# Patient Record
Sex: Female | Born: 1990 | Hispanic: Yes | Marital: Single | Smoking: Never smoker
Health system: Southern US, Community
[De-identification: ages and names within clinical notes are randomized; demographics above are authoritative.]

## PROBLEM LIST (undated history)

## (undated) DIAGNOSIS — G43909 Migraine, unspecified, not intractable, without status migrainosus: Secondary | ICD-10-CM

## (undated) DIAGNOSIS — Z803 Family history of malignant neoplasm of breast: Secondary | ICD-10-CM

## (undated) DIAGNOSIS — R748 Abnormal levels of other serum enzymes: Secondary | ICD-10-CM

## (undated) DIAGNOSIS — R7303 Prediabetes: Secondary | ICD-10-CM

## (undated) DIAGNOSIS — Z800 Family history of malignant neoplasm of digestive organs: Secondary | ICD-10-CM

## (undated) DIAGNOSIS — K760 Fatty (change of) liver, not elsewhere classified: Secondary | ICD-10-CM

## (undated) DIAGNOSIS — J45909 Unspecified asthma, uncomplicated: Secondary | ICD-10-CM

## (undated) DIAGNOSIS — C50919 Malignant neoplasm of unspecified site of unspecified female breast: Secondary | ICD-10-CM

## (undated) DIAGNOSIS — E282 Polycystic ovarian syndrome: Secondary | ICD-10-CM

## (undated) DIAGNOSIS — R739 Hyperglycemia, unspecified: Secondary | ICD-10-CM

## (undated) DIAGNOSIS — Z8042 Family history of malignant neoplasm of prostate: Secondary | ICD-10-CM

## (undated) DIAGNOSIS — Z8041 Family history of malignant neoplasm of ovary: Secondary | ICD-10-CM

## (undated) HISTORY — PX: NO PAST SURGERIES: SHX2092

## (undated) HISTORY — DX: Polycystic ovarian syndrome: E28.2

## (undated) HISTORY — DX: Family history of malignant neoplasm of prostate: Z80.42

## (undated) HISTORY — DX: Family history of malignant neoplasm of ovary: Z80.41

## (undated) HISTORY — DX: Migraine, unspecified, not intractable, without status migrainosus: G43.909

## (undated) HISTORY — DX: Unspecified asthma, uncomplicated: J45.909

## (undated) HISTORY — DX: Family history of malignant neoplasm of digestive organs: Z80.0

## (undated) HISTORY — DX: Malignant neoplasm of unspecified site of unspecified female breast: C50.919

## (undated) HISTORY — DX: Family history of malignant neoplasm of breast: Z80.3

---

## 2014-07-28 ENCOUNTER — Other Ambulatory Visit (HOSPITAL_COMMUNITY): Payer: Self-pay | Admitting: Nurse Practitioner

## 2014-07-28 DIAGNOSIS — R945 Abnormal results of liver function studies: Secondary | ICD-10-CM

## 2014-07-28 DIAGNOSIS — R7989 Other specified abnormal findings of blood chemistry: Secondary | ICD-10-CM

## 2014-07-28 DIAGNOSIS — D6862 Lupus anticoagulant syndrome: Secondary | ICD-10-CM

## 2014-08-01 ENCOUNTER — Other Ambulatory Visit: Payer: Self-pay | Admitting: Radiology

## 2014-08-02 ENCOUNTER — Other Ambulatory Visit: Payer: Self-pay | Admitting: Radiology

## 2014-08-03 ENCOUNTER — Encounter (HOSPITAL_COMMUNITY): Payer: Self-pay

## 2014-08-03 ENCOUNTER — Ambulatory Visit (HOSPITAL_COMMUNITY)
Admission: RE | Admit: 2014-08-03 | Discharge: 2014-08-03 | Disposition: A | Discharge status: Home / Self Care | Payer: BLUE CROSS/BLUE SHIELD | Source: Ambulatory Visit | Attending: Nurse Practitioner | Admitting: Nurse Practitioner

## 2014-08-03 DIAGNOSIS — R894 Abnormal immunological findings in specimens from other organs, systems and tissues: Secondary | ICD-10-CM | POA: Insufficient documentation

## 2014-08-03 DIAGNOSIS — R7989 Other specified abnormal findings of blood chemistry: Secondary | ICD-10-CM

## 2014-08-03 DIAGNOSIS — D6862 Lupus anticoagulant syndrome: Secondary | ICD-10-CM

## 2014-08-03 DIAGNOSIS — R945 Abnormal results of liver function studies: Secondary | ICD-10-CM

## 2014-08-03 LAB — CBC
HCT: 43.7 % (ref 36.0–46.0)
HEMOGLOBIN: 15.3 g/dL — AB (ref 12.0–15.0)
MCH: 29.3 pg (ref 26.0–34.0)
MCHC: 35 g/dL (ref 30.0–36.0)
MCV: 83.7 fL (ref 78.0–100.0)
Platelets: 294 10*3/uL (ref 150–400)
RBC: 5.22 MIL/uL — ABNORMAL HIGH (ref 3.87–5.11)
RDW: 13 % (ref 11.5–15.5)
WBC: 4.8 10*3/uL (ref 4.0–10.5)

## 2014-08-03 LAB — PROTIME-INR
INR: 1.1 (ref 0.00–1.49)
Prothrombin Time: 14.3 seconds (ref 11.6–15.2)

## 2014-08-03 LAB — APTT: APTT: 31 s (ref 24–37)

## 2014-08-03 IMAGING — US US BIOPSY
1 series · 7 of 7 positions shown · non-contrast
Comparison: none

CLINICAL DATA: 23-year-old female with a history of transaminitis.
She has been referred for medical liver biopsy.

[Series 1: us biopsy · 0.27mm/px · 7 of 7 slices shown]
[im 1/7]
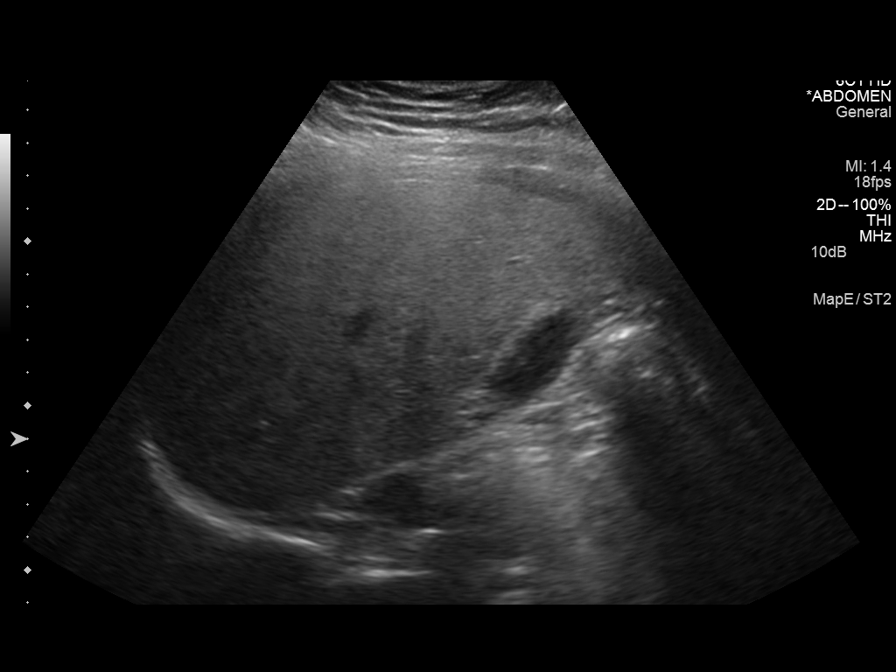
[im 2/7]
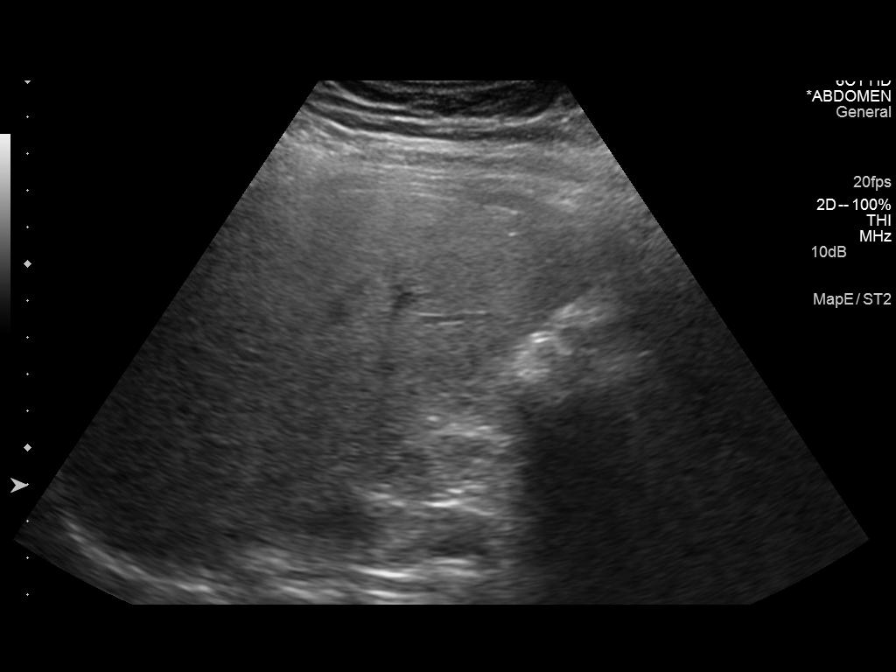
[im 3/7]
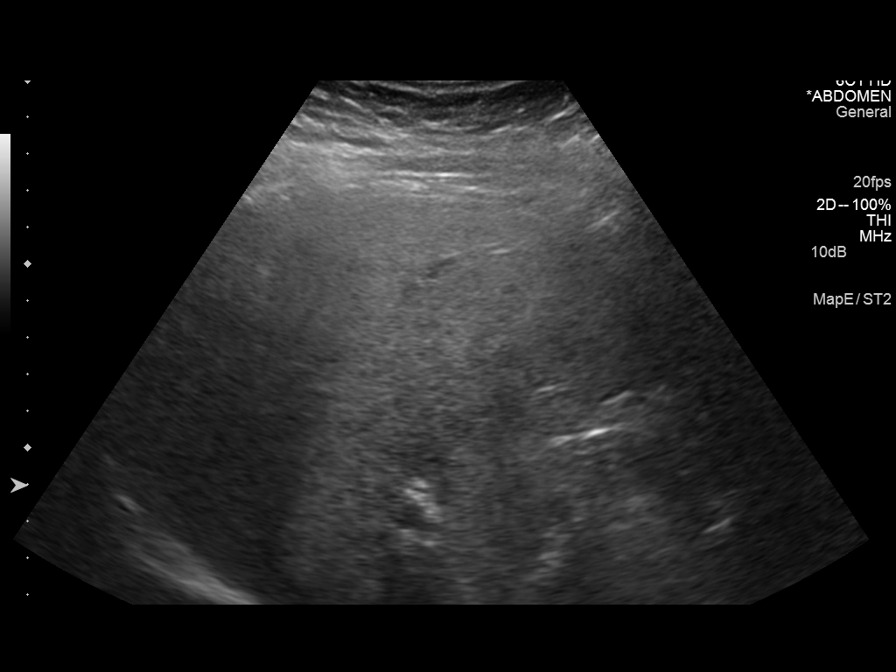
[im 4/7]
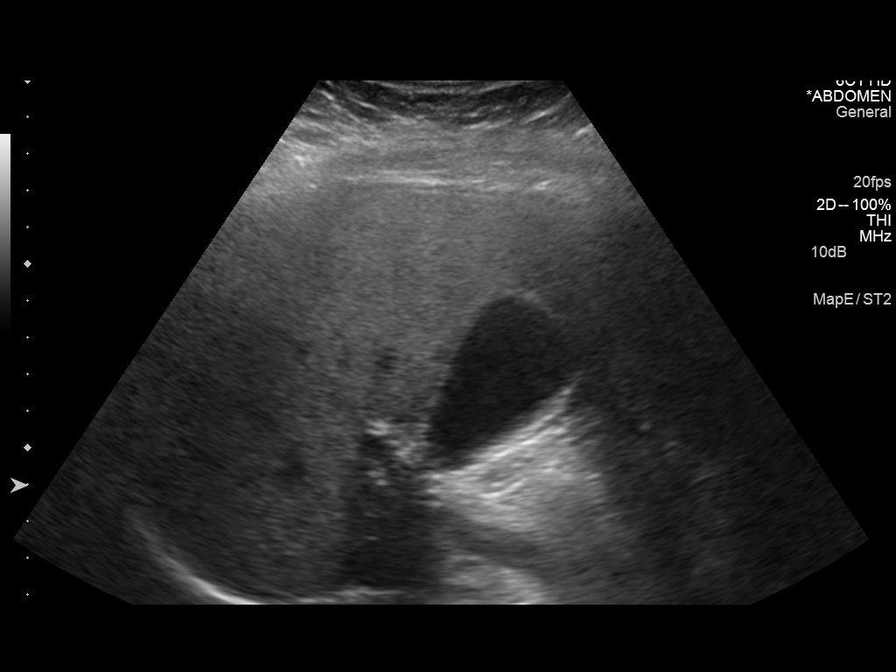
[im 5/7]
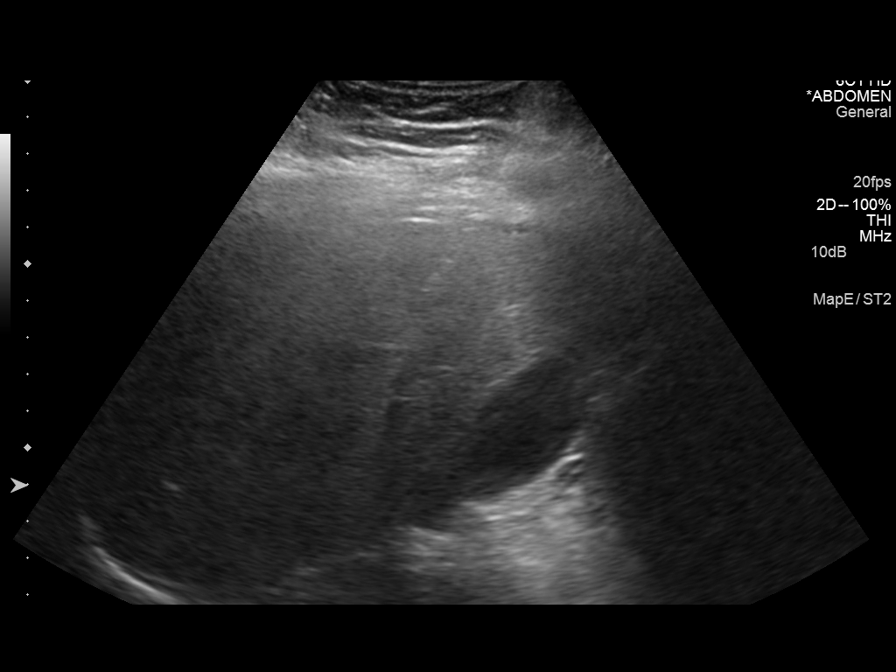
[im 6/7]
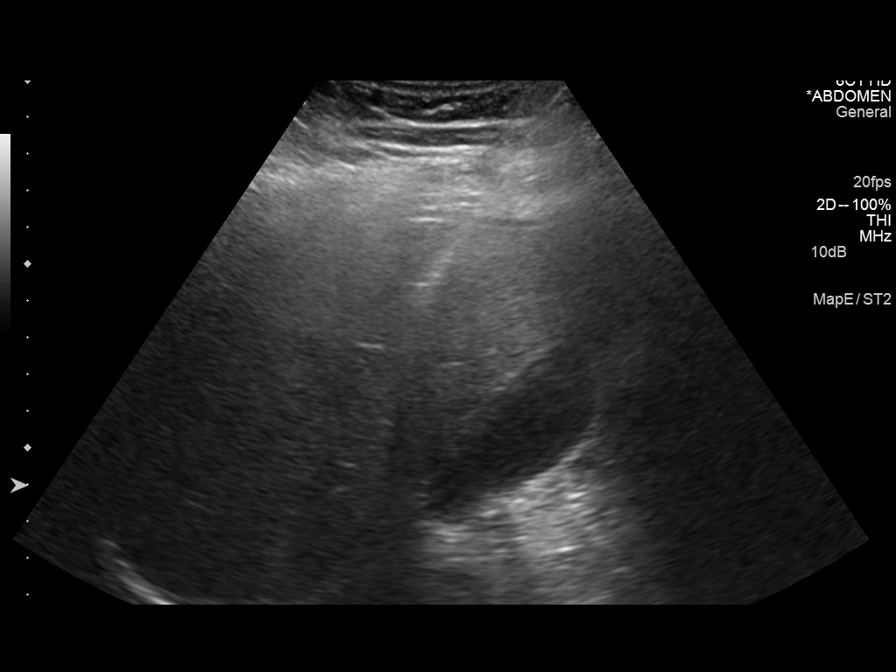
[im 7/7]
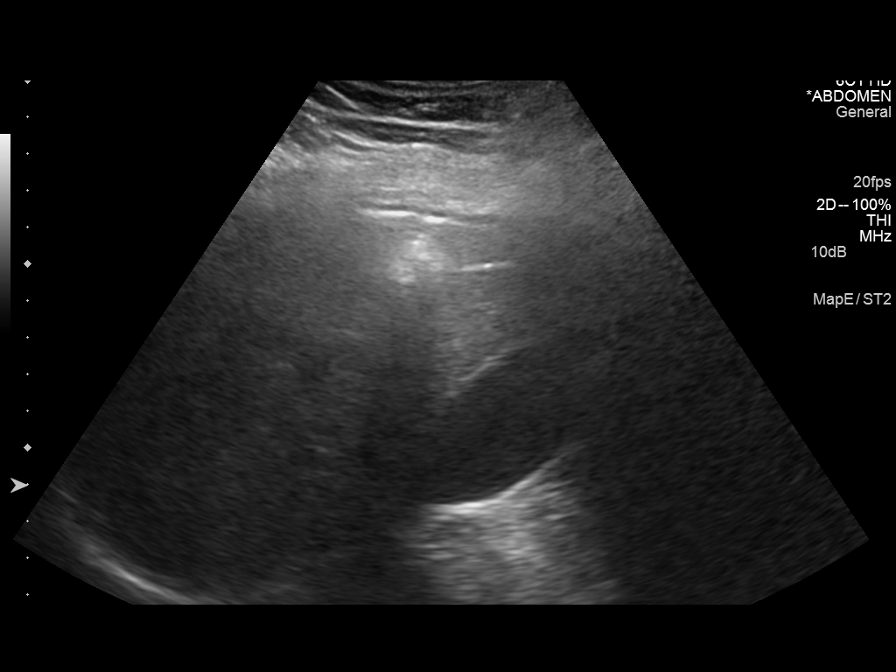

[7 of 7 positions shown; findings below may reference images not displayed]

EXAM:
ULTRASOUND GUIDED CORE BIOPSY OF LIVER

MEDICATIONS:
1.5 mg IV Versed; 75 mcg IV Fentanyl

Total Moderate Sedation Time: 8 minutes

PROCEDURE:
The procedure, risks, benefits, and alternatives were explained to
the patient. Questions regarding the procedure were encouraged and
answered. The patient understands and consents to the procedure.

Ultrasound survey of the liver was performed with images stored and
sent to PACs.

The right lower thorax was prepped with Betadine in a sterile
fashion, and a sterile drape was applied covering the operative
field. A sterile gown and sterile gloves were used for the
procedure. Local anesthesia was provided with 1% Lidocaine.

Once the patient is prepped and draped sterilely, the skin and
subcutaneous tissues were generously infiltrated with 1% lidocaine
without epinephrine. A small stab incision was made, an using
ultrasound guidance, a 17 gauge guide needle was advanced into the
right liver lobe. The stylet was removed, and 4 separate 18 gauge
core biopsy were retrieved. These were placed into formalin.

We then infused 3 Gel-Foam pledgets for hemostasis.

The needle was removed, sterile bandage was placed, and a final
ultrasound image was stored.

The patient tolerated the procedure well and remained
hemodynamically stable throughout.

No complications were encountered and no significant blood loss was
encountered.

COMPLICATIONS:
None.
FINDINGS: Coarsened echotexture of liver.

Images during the case demonstrate safe placement of coaxial biopsy
system into the right liver lobe.

After infusion of Gel-Foam pledgets there is rib hyperechoic focus
within the liver parenchyma with posterior shadowing.

No complicating features.
IMPRESSION: Status post ultrasound-guided medical liver biopsy for this patient
with transaminitis. Tissue specimen sent to pathology for complete
histopathologic analysis.

## 2014-08-03 MED ORDER — SODIUM CHLORIDE 0.9 % IV SOLN
Freq: Once | INTRAVENOUS | Status: AC
  Administered 2014-08-03: 10:00:00 via INTRAVENOUS

## 2014-08-03 MED ORDER — GELATIN ABSORBABLE 12-7 MM EX MISC
CUTANEOUS | Status: AC
  Filled 2014-08-03: qty 1

## 2014-08-03 MED ORDER — MIDAZOLAM HCL 2 MG/2ML IJ SOLN
INTRAMUSCULAR | Status: AC | PRN
  Administered 2014-08-03: 1 mg via INTRAVENOUS
  Administered 2014-08-03: 0.5 mg via INTRAVENOUS

## 2014-08-03 MED ORDER — LIDOCAINE HCL (PF) 1 % IJ SOLN
INTRAMUSCULAR | Status: AC
Start: 2014-08-03 — End: 2014-08-03
  Filled 2014-08-03: qty 10

## 2014-08-03 MED ORDER — MIDAZOLAM HCL 2 MG/2ML IJ SOLN
INTRAMUSCULAR | Status: AC
  Filled 2014-08-03: qty 2

## 2014-08-03 MED ORDER — FENTANYL CITRATE 0.05 MG/ML IJ SOLN
INTRAMUSCULAR | Status: AC | PRN
  Administered 2014-08-03: 25 ug via INTRAVENOUS
  Administered 2014-08-03: 50 ug via INTRAVENOUS

## 2014-08-03 MED ORDER — FENTANYL CITRATE 0.05 MG/ML IJ SOLN
INTRAMUSCULAR | Status: AC
  Filled 2014-08-03: qty 2

## 2014-08-03 NOTE — Sedation Documentation (Signed)
bandaid to RUQ dry and intact. Pt denies pain now.

## 2014-08-03 NOTE — Procedures (Signed)
Interventional Radiology Procedure Note  Procedure: US guided biopsy of liver for elevated enzymes.  Non-targeted right lobe.    Complications: No immediate Recommendations:  - Ok to shower tomorrow - Do not submerge for 7 days - Routine care - observe for 2-3 hours   Signed,  Dulcy Fanny. Earleen Newport, DO

## 2014-08-03 NOTE — Sedation Documentation (Signed)
Pt tolerating well, VSS

## 2014-08-03 NOTE — Sedation Documentation (Signed)
Dr. Earleen Newport at bedside s/w pt regarding risks of Cat. C meds w/ pregnancy, procedure, and answering questions. Pt stated she felt comfortable proceeding with sedation meds and did not think she was pregnant.

## 2014-08-03 NOTE — Sedation Documentation (Signed)
Spoke with Dr. Earleen Newport, lab stated coag results would be at least another 10 minutes.  Pt stated LMP December 2015.  pt stated she did not belive she was pregnant and has abnormal ovulation/menstrual cycles. pt stated she is not on birth control.  This information was discussed with Dr. Earleen Newport who stated he would discuss this further with the patient when he arrived in the room regarding Category C sedation meds.

## 2014-08-03 NOTE — Discharge Instructions (Signed)
Liver Biopsy, Care After °Refer to this sheet in the next few weeks. These instructions provide you with information on caring for yourself after your procedure. Your health care provider may also give you more specific instructions. Your treatment has been planned according to current medical practices, but problems sometimes occur. Call your health care provider if you have any problems or questions after your procedure. °WHAT TO EXPECT AFTER THE PROCEDURE °After your procedure, it is typical to have the following: °· A small amount of discomfort in the area where the biopsy was done and in the right shoulder or shoulder blade. °· A small amount of bruising around the area where the biopsy was done and on the skin over the liver. °· Sleepiness and fatigue for the rest of the day. °HOME CARE INSTRUCTIONS  °· Rest at home for 1-2 days or as directed by your health care provider. °· Have a friend or family member stay with you for at least 24 hours. °· Because of the medicines used during the procedure, you should not do the following things in the first 24 hours: °¨ Drive. °¨ Use machinery. °¨ Be responsible for the care of other people. °¨ Sign legal documents. °¨ Take a bath or shower. °· There are many different ways to close and cover an incision, including stitches, skin glue, and adhesive strips. Follow your health care provider's instructions on: °¨ Incision care. °¨ Bandage (dressing) changes and removal. °¨ Incision closure removal. °· Do not drink alcohol in the first week. °· Do not lift more than 5 pounds or play contact sports for 2 weeks after this test. °· Take medicines only as directed by your health care provider. Do not take medicine containing aspirin or non-steroidal anti-inflammatory medicines such as ibuprofen for 1 week after this test. °· It is your responsibility to get your test results. °SEEK MEDICAL CARE IF:  °· You have increased bleeding from an incision that results in more than a  small spot of blood. °· You have redness, swelling, or increasing pain in any incisions. °· You notice a discharge or a bad smell coming from any of your incisions. °· You have a fever or chills. °SEEK IMMEDIATE MEDICAL CARE IF:  °· You develop swelling, bloating, or pain in your abdomen. °· You become dizzy or faint. °· You develop a rash. °· You are nauseous or vomit. °· You have difficulty breathing, feel short of breath, or feel faint. °· You develop chest pain. °· You have problems with your speech or vision. °· You have trouble balancing or moving your arms or legs. °Document Released: 12/07/2004 Document Revised: 10/04/2013 Document Reviewed: 07/16/2013 °[employer]® Patient Information ©2015 [employer]. This information is not intended to replace advice given to you by your health care provider. Make sure you discuss any questions you have with your health care provider. ° °

## 2014-08-03 NOTE — H&P (Signed)
Chief Complaint:  Elevated Liver function tests  Referring Physician(s): Drazek,Dawn NP  History of Present Illness: Gabriela Sullivan is a 24 y.o. female   Pt went to [facility] regarding LUQ pain Labs there revealed incidental elevated liver function tests Referred to Roosevelt Locks NP for evaluation Now scheduled for random liver core biopsy  History reviewed. No pertinent past medical history.  History reviewed. No pertinent past surgical history.  Allergies: Review of patient's allergies indicates no known allergies.  Medications: Prior to Admission medications   Medication Sig Start Date End Date Taking? Authorizing Provider  Vitamin D, Ergocalciferol, (DRISDOL) 50000 UNITS CAPS capsule Take 50,000 Units by mouth every 7 (seven) days.   Yes Historical Provider, MD     History reviewed. No pertinent family history.  History   Social History  . Marital Status: Single    Spouse Name: N/A  . Number of Children: N/A  . Years of Education: N/A   Social History Main Topics  . Smoking status: Never Smoker   . Smokeless tobacco: Not on file  . Alcohol Use: Not on file  . Drug Use: Not on file  . Sexual Activity: Not on file   Other Topics Concern  . None   Social History Narrative  . None     Review of Systems: A 12 point ROS discussed and pertinent positives are indicated in the HPI above.  All other systems are negative.  Review of Systems  Constitutional: Negative for activity change, appetite change and fatigue.  Respiratory: Negative for shortness of breath.   Gastrointestinal: Positive for abdominal pain. Negative for nausea.  Genitourinary: Negative for difficulty urinating.  Neurological: Negative for weakness.  Psychiatric/Behavioral: Negative for behavioral problems and confusion.    Vital Signs: BP 129/82 mmHg  Pulse 73  Temp(Src) 98.3 F (36.8 C)  Resp 20  Ht 5' (1.524 m)  Wt 79.379 kg (175 lb)  BMI 34.18 kg/m2  SpO2 100%  Physical Exam    Constitutional: She appears well-nourished.  Cardiovascular: Normal rate, regular rhythm and normal heart sounds.   No murmur heard. Pulmonary/Chest: Effort normal and breath sounds normal. She has no wheezes.  Abdominal: Soft. Bowel sounds are normal. There is no tenderness.  Musculoskeletal: Normal range of motion.  Neurological: She is alert.  Skin: Skin is warm and dry.  Psychiatric: She has a normal mood and affect. Her behavior is normal. Judgment and thought content normal.  Nursing note and vitals reviewed.   Mallampati Score:  MD Evaluation Airway: WNL Heart: WNL Abdomen: WNL Chest/ Lungs: WNL ASA  Classification: 2 Mallampati/Airway Score: One  Imaging: No results found.  Labs:  CBC: No results for input(s): WBC, HGB, HCT, PLT in the last 8760 hours.  COAGS: No results for input(s): INR, APTT in the last 8760 hours.  BMP: No results for input(s): NA, K, CL, CO2, GLUCOSE, BUN, CALCIUM, CREATININE, GFRNONAA, GFRAA in the last 8760 hours.  Invalid input(s): CMP  LIVER FUNCTION TESTS: No results for input(s): BILITOT, AST, ALT, ALKPHOS, PROT, ALBUMIN in the last 8760 hours.  TUMOR MARKERS: No results for input(s): AFPTM, CEA, CA199, CHROMGRNA in the last 8760 hours.  Assessment and Plan:  Elevated liver function tests Now scheduled for liver core bx Risks and Benefits discussed with the patient including, but not limited to bleeding, infection, damage to adjacent structures or low yield requiring additional tests. All of the patient's questions were answered, patient is agreeable to proceed. Consent signed and in chart.  Thank  you for this interesting consult.  I greatly enjoyed meeting Gabriela Sullivan and look forward to participating in their care.  Signed: Leroi Haque A 08/03/2014, 10:01 AM   I spent a total of  20 Minutes   in face to face in clinical consultation, greater than 50% of which was counseling/coordinating care for liver core  bx

## 2020-09-13 ENCOUNTER — Other Ambulatory Visit: Payer: Self-pay | Admitting: Family Medicine

## 2020-09-18 ENCOUNTER — Other Ambulatory Visit: Payer: Self-pay | Admitting: Family Medicine

## 2020-09-18 DIAGNOSIS — R928 Other abnormal and inconclusive findings on diagnostic imaging of breast: Secondary | ICD-10-CM

## 2020-09-25 ENCOUNTER — Ambulatory Visit
Admission: RE | Admit: 2020-09-25 | Discharge: 2020-09-25 | Disposition: A | Discharge status: Home / Self Care | Payer: BLUE CROSS/BLUE SHIELD | Source: Ambulatory Visit | Attending: Family Medicine | Admitting: Family Medicine

## 2020-09-25 ENCOUNTER — Ambulatory Visit
Admission: RE | Admit: 2020-09-25 | Discharge: 2020-09-25 | Disposition: A | Discharge status: Home / Self Care | Payer: No Typology Code available for payment source | Source: Ambulatory Visit | Attending: Family Medicine | Admitting: Family Medicine

## 2020-09-25 ENCOUNTER — Other Ambulatory Visit: Payer: Self-pay

## 2020-09-25 DIAGNOSIS — R928 Other abnormal and inconclusive findings on diagnostic imaging of breast: Secondary | ICD-10-CM

## 2020-09-25 IMAGING — MG MM BREAST BX W LOC DEV 1ST LESION IMAGE BX SPEC STEREO GUIDE*L*
6 series · 6 of 6 positions shown · non-contrast
Comparison: Previous exams.
COMPARISON: Previous exams.

Addendum:
CLINICAL DATA: Biopsy of bilateral breast calcifications

EXAM:
BREAST STEREOTACTIC CORE NEEDLE BIOPSY

[R (1 of 6)]
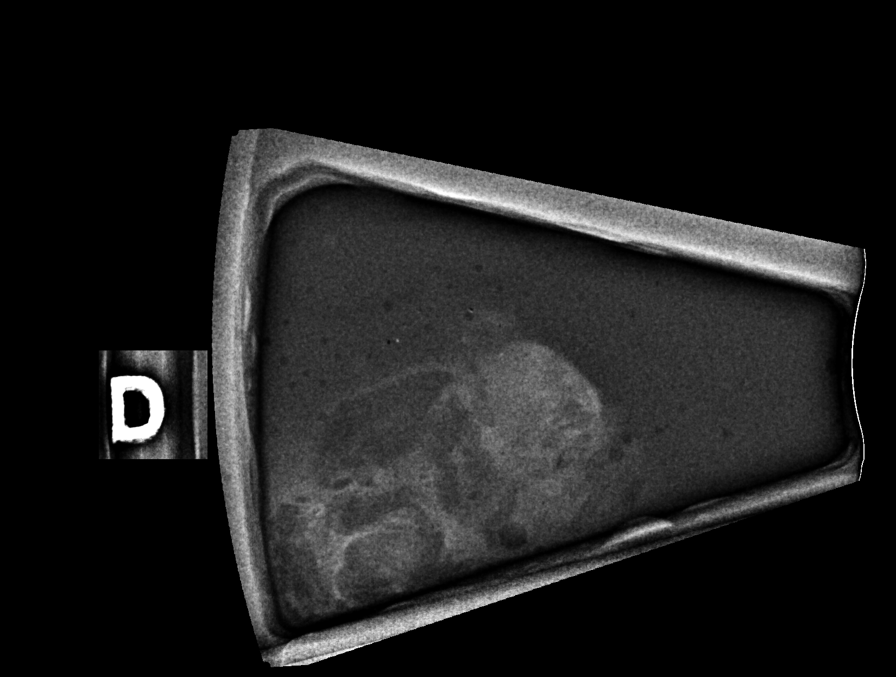

[R (2 of 6)]
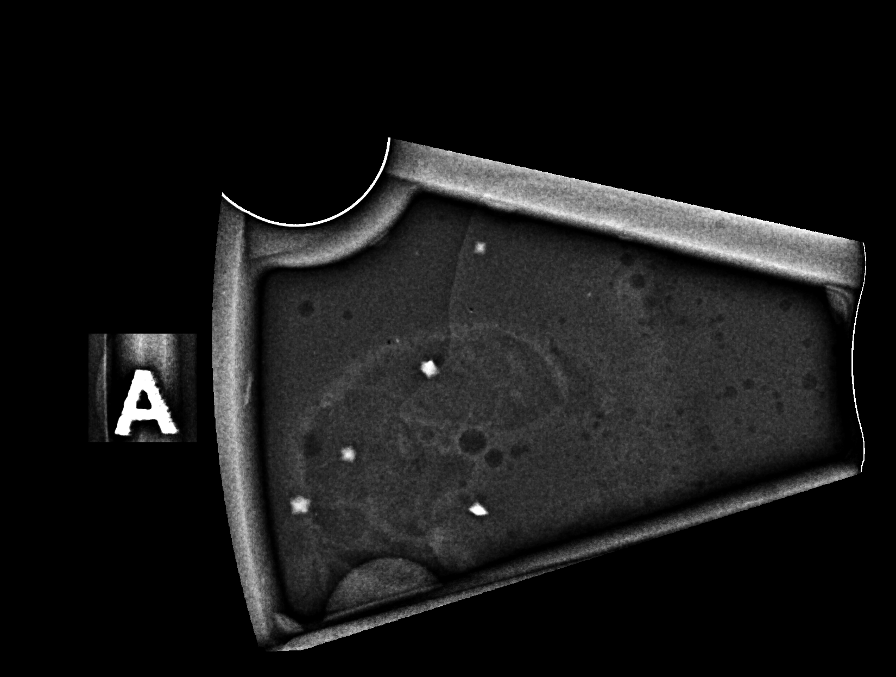

[R (3 of 6)]
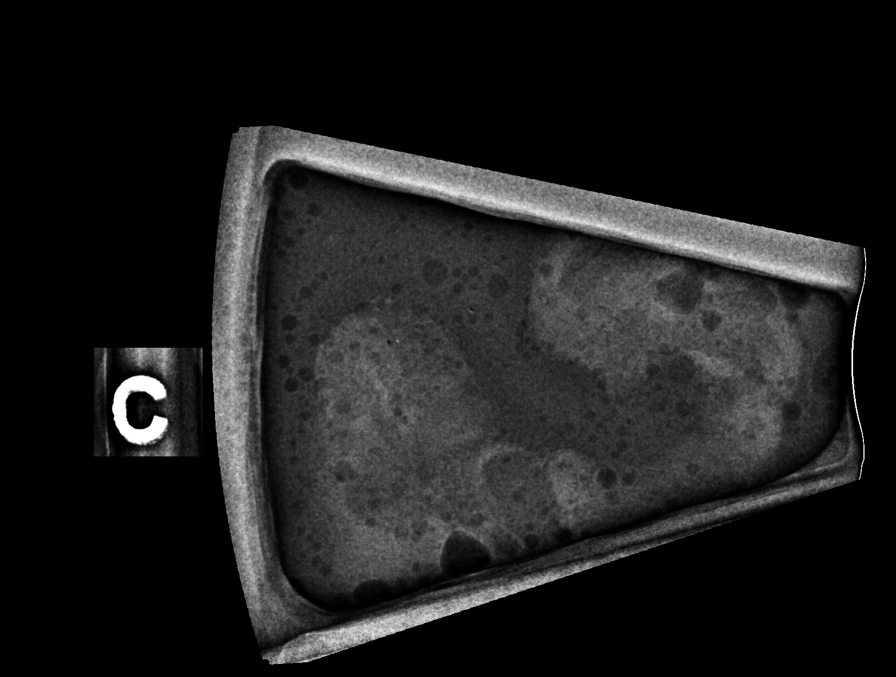

[R (4 of 6)]
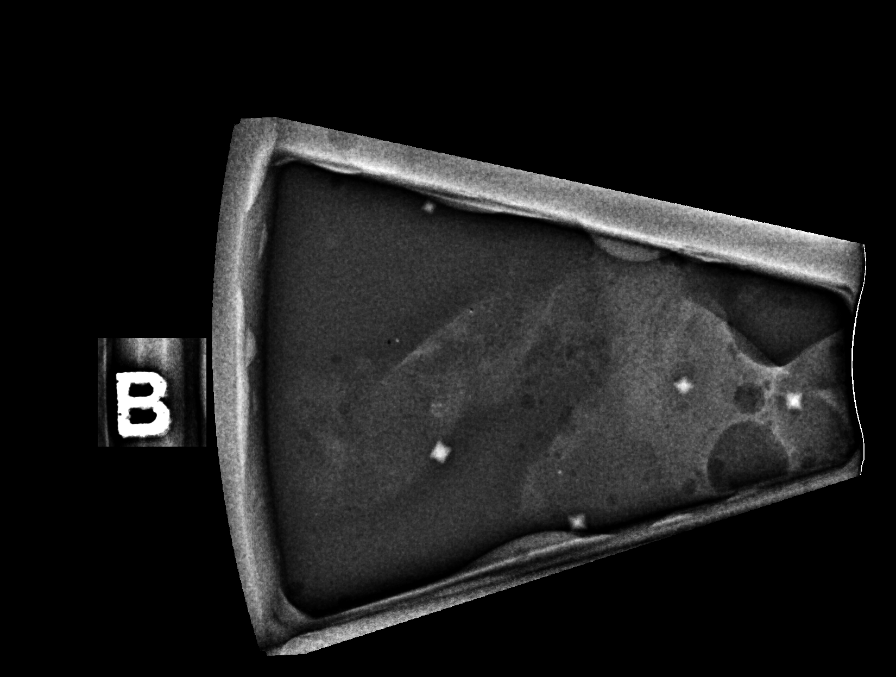

[R (5 of 6)]
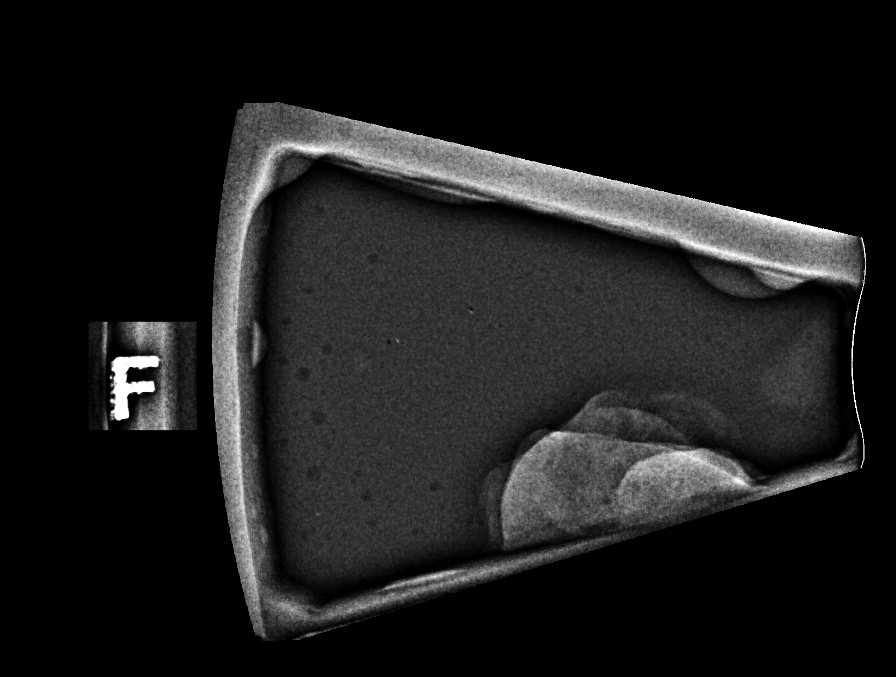

[R (6 of 6)]
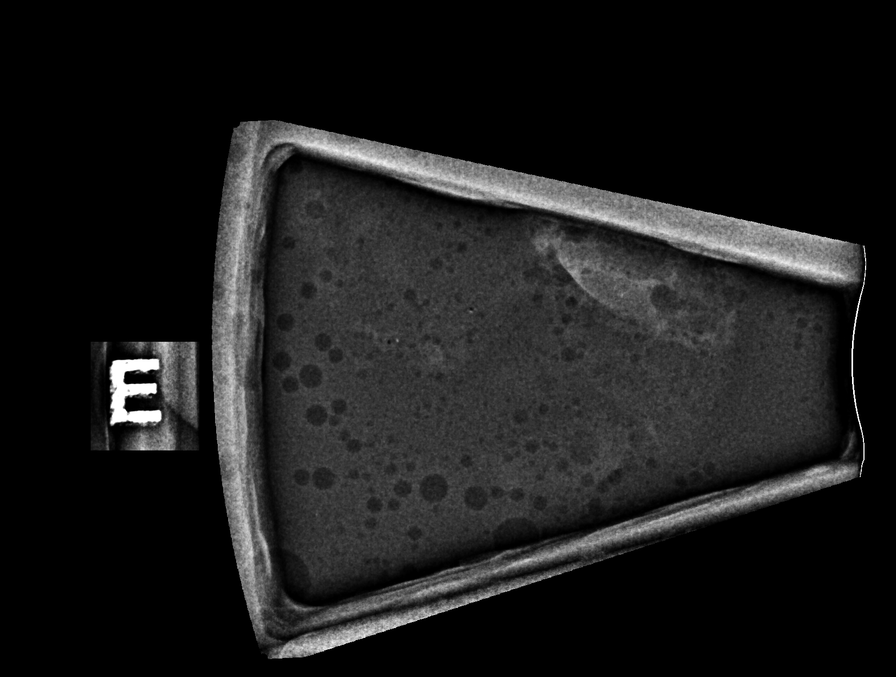

[6 of 6 positions shown; findings below may reference images not displayed]



Using sterile technique and 1% Lidocaine as local anesthetic, under
stereotactic guidance, a 9 gauge vacuum assisted device was used to
perform core needle biopsy of calcifications in the upper-outer left
breast using a superior approach. Specimen radiograph was performed
showing calcifications in several core specimens. Specimens with
calcifications are identified for pathology.

Lesion quadrant: Upper outer left breast

At the conclusion of the procedure, a coil shaped tissue marker clip
was deployed into the biopsy cavity. Follow-up 2-view mammogram was
performed and dictated separately.

Using sterile technique and 1% Lidocaine as local anesthetic, under
stereotactic guidance, a 9 gauge vacuum assisted device was used to
perform core needle biopsy of calcifications in the upper outer
right breast using a superior approach. Specimen radiograph was
performed showing calcifications in several core specimens.
Specimens with calcifications are identified for pathology.

Lesion quadrant: Upper outer right breast

At the conclusion of the procedure, an X shaped tissue marker clip
was deployed into the biopsy cavity. Follow-up 2-view mammogram was
performed and dictated separately.
IMPRESSION: Stereotactic-guided biopsy of calcifications in the upper-outer left
breast and the upper outer right breast. No apparent complications.

ADDENDUM:
Pathology revealed GRADE II INVASIVE DUCTAL CARCINOMA, DUCTAL
CARCINOMA IN SITU WITH CALCIFICATIONS AND FOCAL NECROSIS of the LEFT
breast, upper outer. This was found to be concordant by Dr. LEGGETT
LEGGETT.

Pathology revealed LOW GRADE DUCTAL CARCINOMA IN SITU of the RIGHT
breast, upper outer. This was found to be concordant by Dr. LEGGETT
LEGGETT.

Pathology results were discussed with the patient by telephone. The
patient reported doing well after the biopsies with tenderness at
the sites. Post biopsy instructions and care were reviewed and
questions were answered. The patient was encouraged to call The
direct phone number was provided.

The patient was referred to [REDACTED]
[REDACTED] at [REDACTED] on

The patient is scheduled for a LEFT breast and LEFT axillary node
biopsy at The [REDACTED] on [DATE]. Further
recommendations will be guided by the results of these biopsies.

Recommendation for a bilateral breast MRI to exclude any additional
sites of disease given age and breast density.

There also appears to be mild skin thickening on the LEFT mammogram,
recommend to evaluate clinically for inflammatory cancer.

Pathology results reported by LEGGETT, RN on [DATE].



Using sterile technique and 1% Lidocaine as local anesthetic, under
stereotactic guidance, a 9 gauge vacuum assisted device was used to
perform core needle biopsy of calcifications in the upper-outer left
breast using a superior approach. Specimen radiograph was performed
showing calcifications in several core specimens. Specimens with
calcifications are identified for pathology.

Lesion quadrant: Upper outer left breast

At the conclusion of the procedure, a coil shaped tissue marker clip
was deployed into the biopsy cavity. Follow-up 2-view mammogram was
performed and dictated separately.

Using sterile technique and 1% Lidocaine as local anesthetic, under
stereotactic guidance, a 9 gauge vacuum assisted device was used to
perform core needle biopsy of calcifications in the upper outer
right breast using a superior approach. Specimen radiograph was
performed showing calcifications in several core specimens.
Specimens with calcifications are identified for pathology.

Lesion quadrant: Upper outer right breast

At the conclusion of the procedure, an X shaped tissue marker clip
was deployed into the biopsy cavity. Follow-up 2-view mammogram was
performed and dictated separately.
IMPRESSION: Stereotactic-guided biopsy of calcifications in the upper-outer left
breast and the upper outer right breast. No apparent complications.

## 2020-09-25 IMAGING — MG MM BREAST LOCALIZATION CLIP
4 series · 4 of 12 positions shown · non-contrast
Comparison: Previous exam(s).

CLINICAL DATA: Evaluate biopsy markers

EXAM:
DIAGNOSTIC BILATERAL MAMMOGRAM POST STEREOTACTIC BIOPSY

[R CC synth-2D]
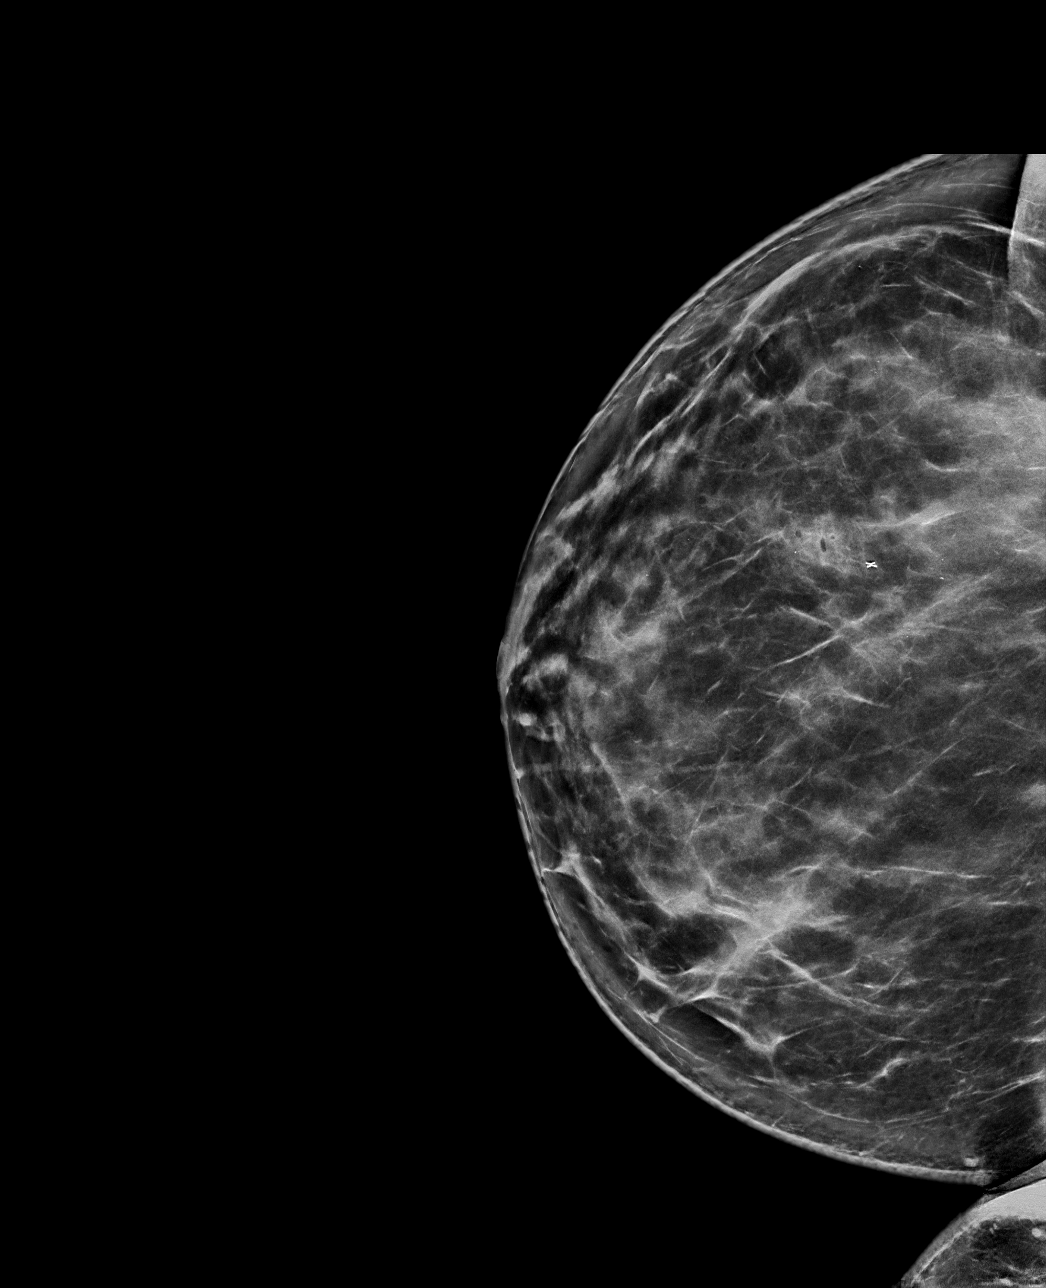

[R ML synth-2D]
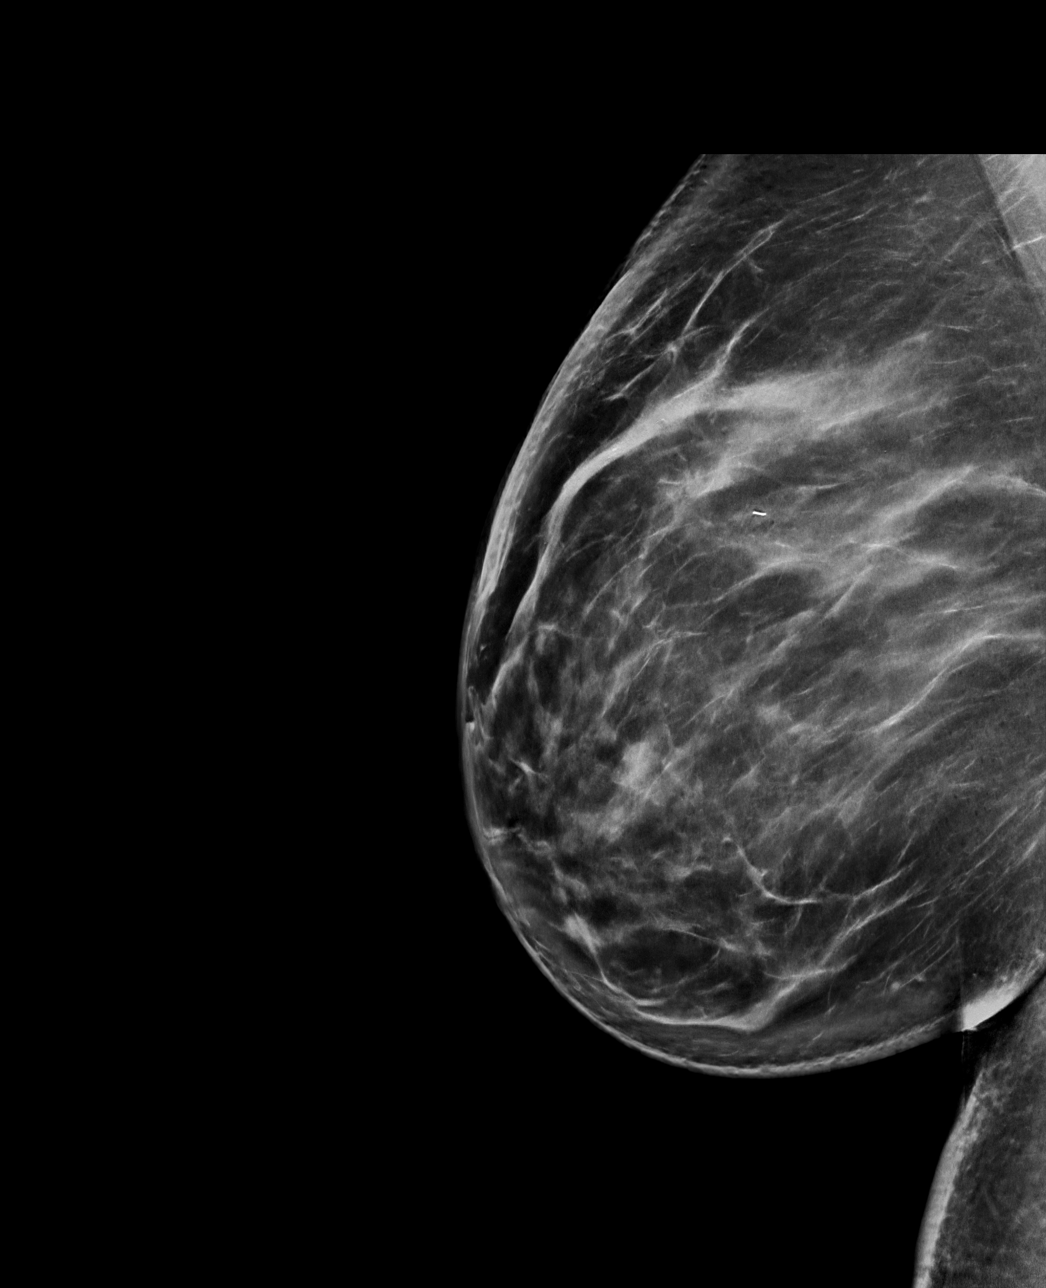

[R ML tomo · tomo slice 57/113.0]
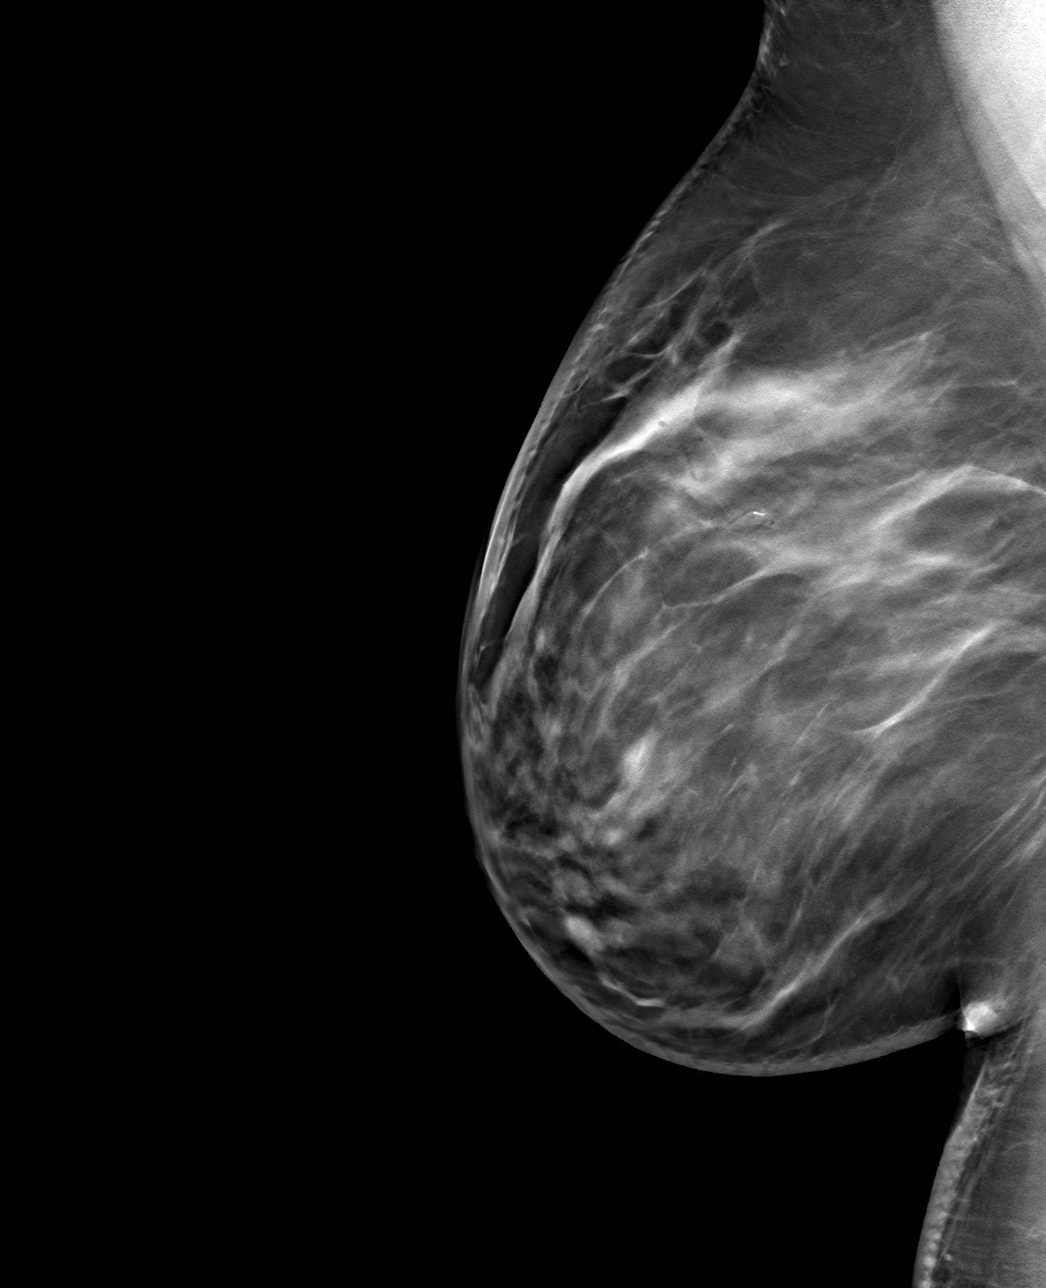

[R CC tomo · tomo slice 48/95.0]
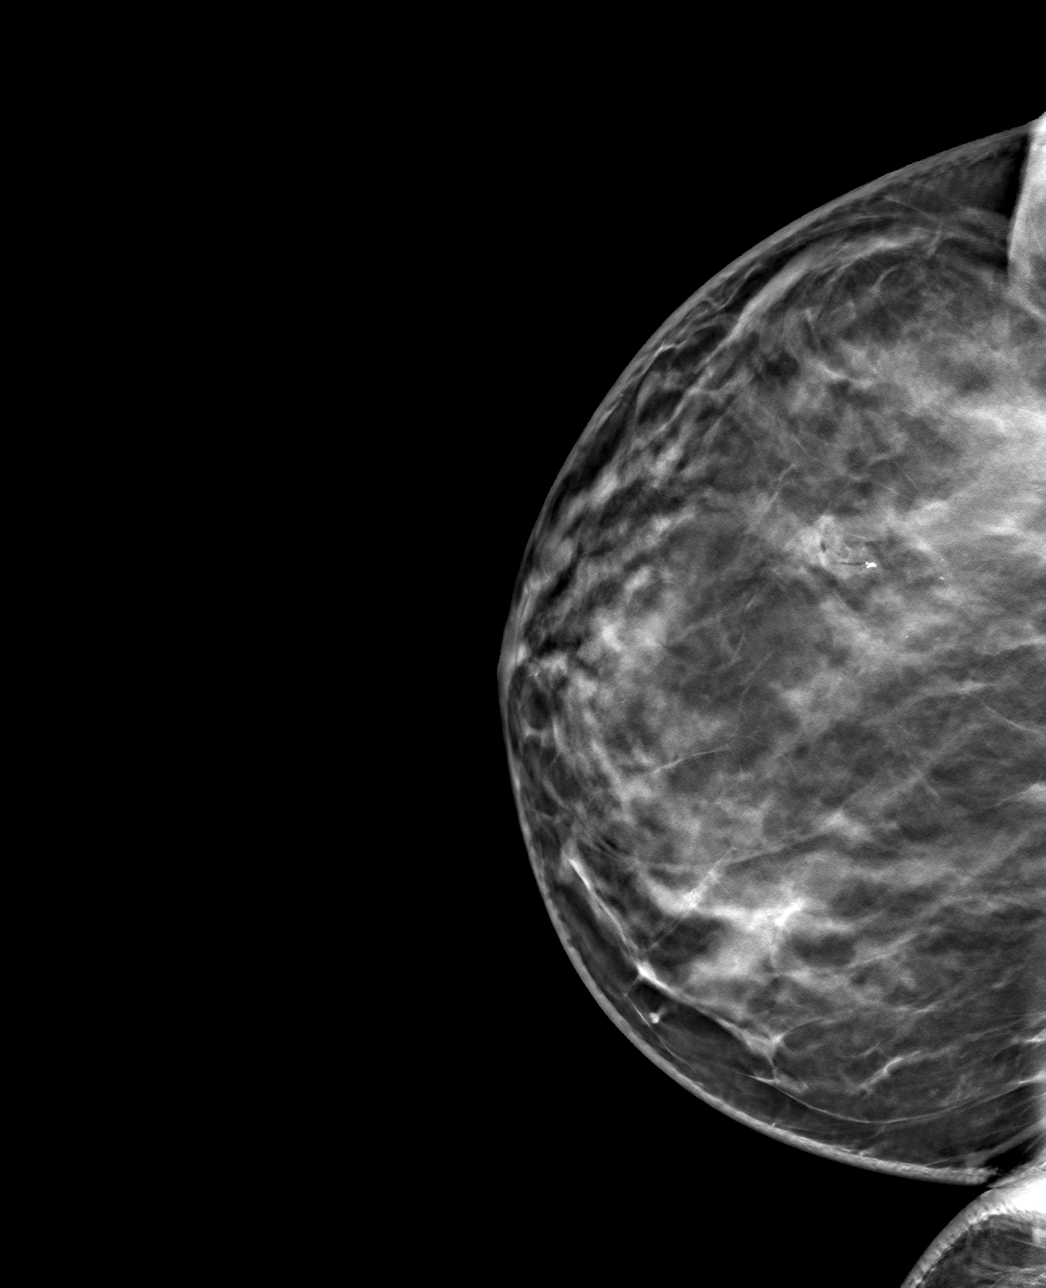

[4 of 12 positions shown; findings below may reference images not displayed]

FINDINGS: Mammographic images were obtained following stereotactic guided
biopsy of bilateral breast calcifications. The biopsy marking clips
are in expected position at the site of biopsy.
IMPRESSION: The X shaped clip is in the approximate location of the biopsied
right breast calcifications. The coil shaped clip is in the region
of the biopsied left breast calcifications.

Final Assessment: Post Procedure Mammograms for Marker Placement

## 2020-09-25 IMAGING — MG MM BREAST BX W/ LOC DEV 1ST LESION IMAGE BX SPEC STEREO GUIDE*R*
7 of 10 series · 7 of 22 positions shown · non-contrast
Comparison: Previous exams.
COMPARISON: Previous exams.

Addendum:
CLINICAL DATA: Biopsy of bilateral breast calcifications

EXAM:
BREAST STEREOTACTIC CORE NEEDLE BIOPSY

[L (1 of 6)]
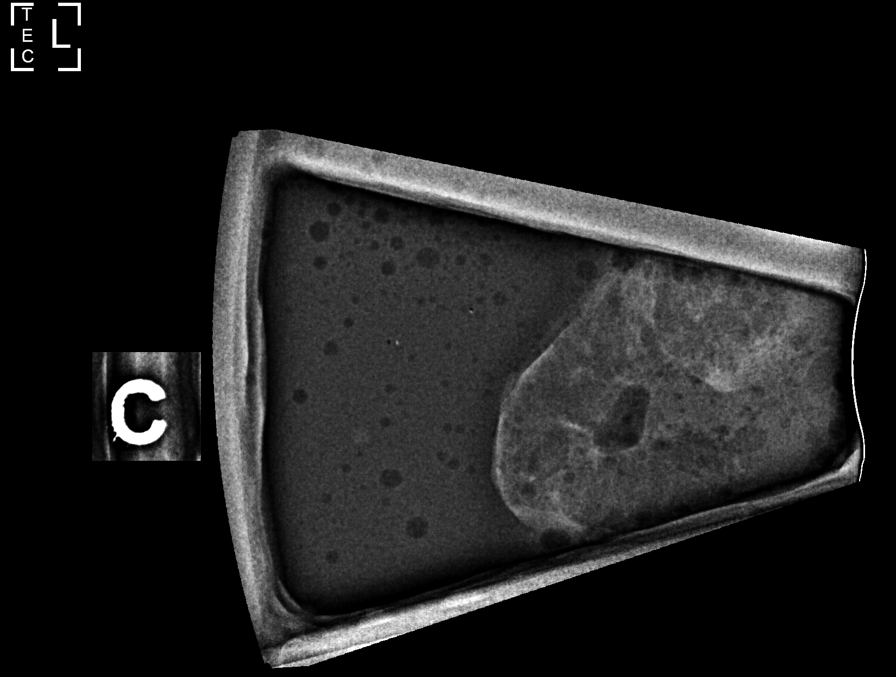

[L (2 of 6)]
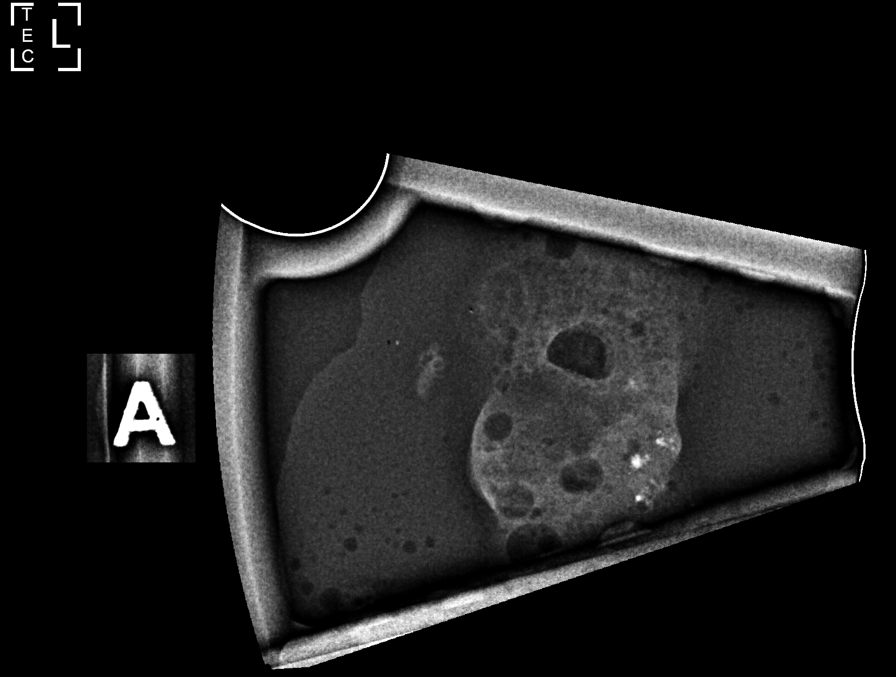

[L (3 of 6)]
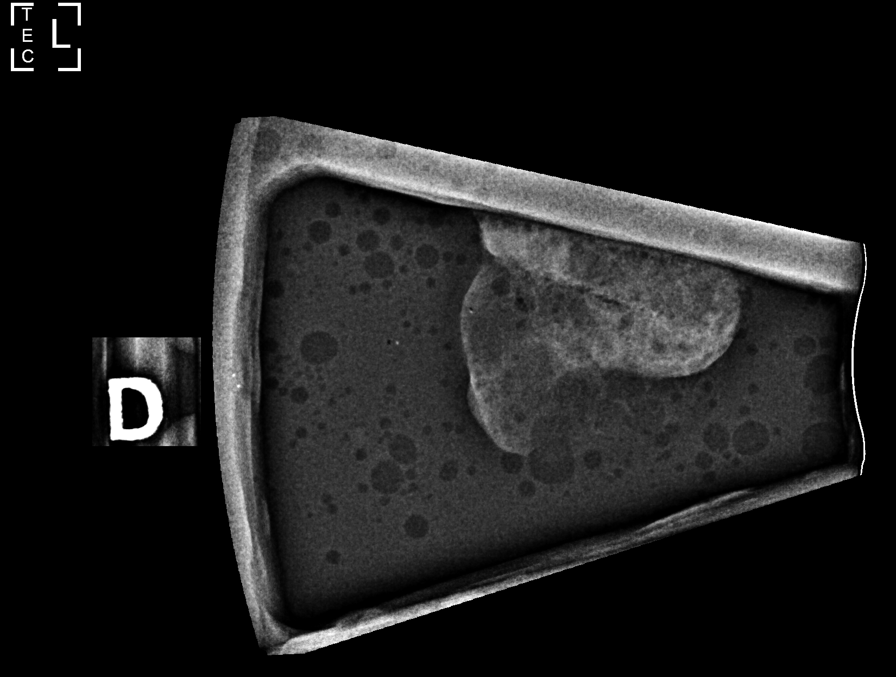

[L (4 of 6)]
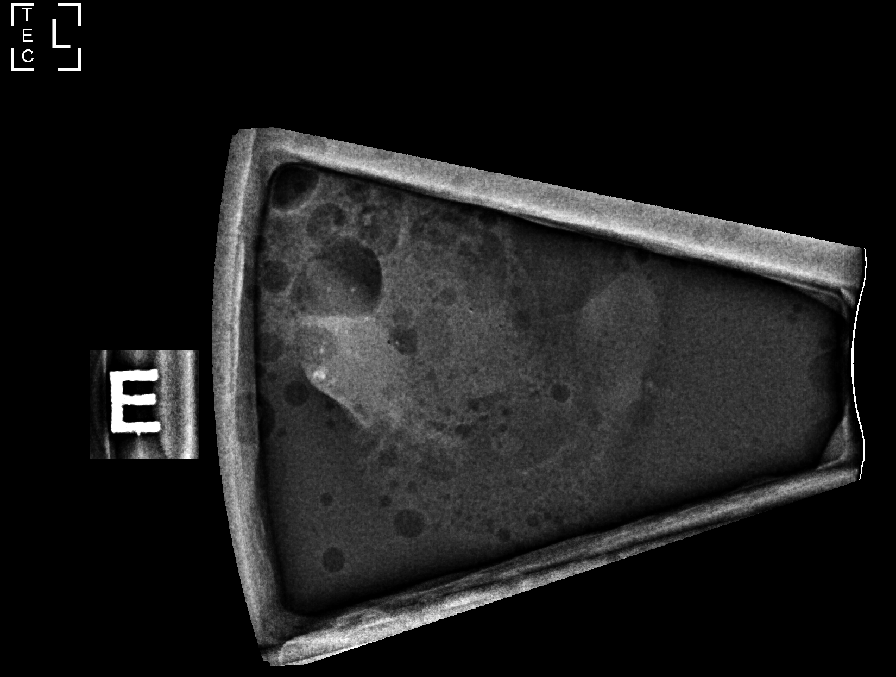

[L (5 of 6)]
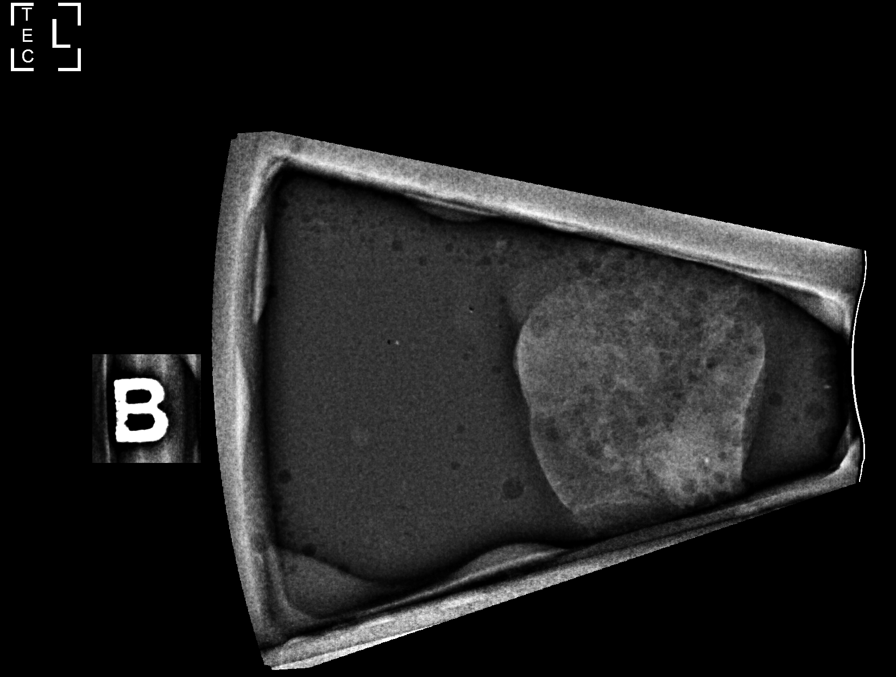

[L (6 of 6)]
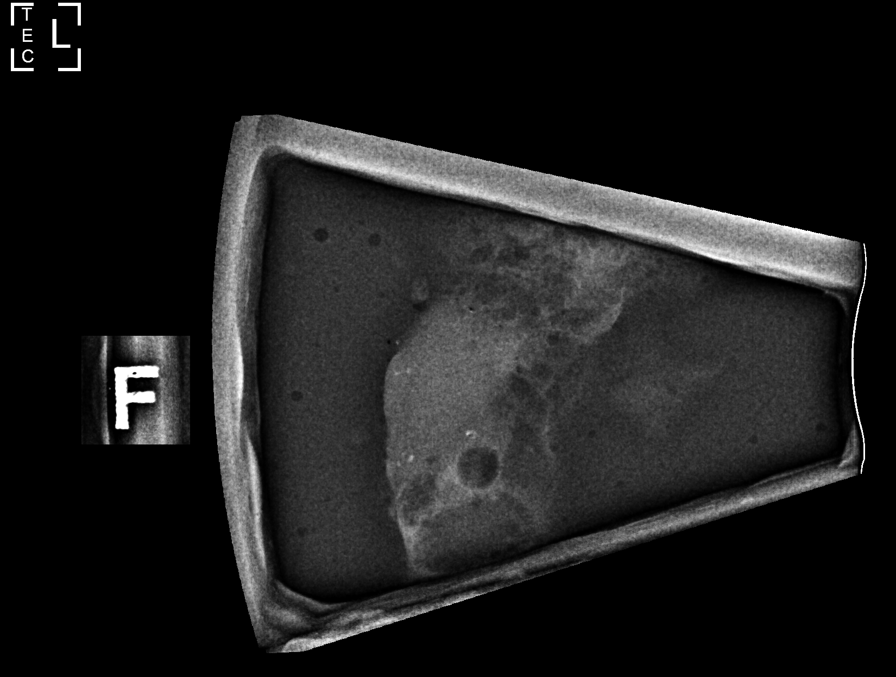

[R CC]
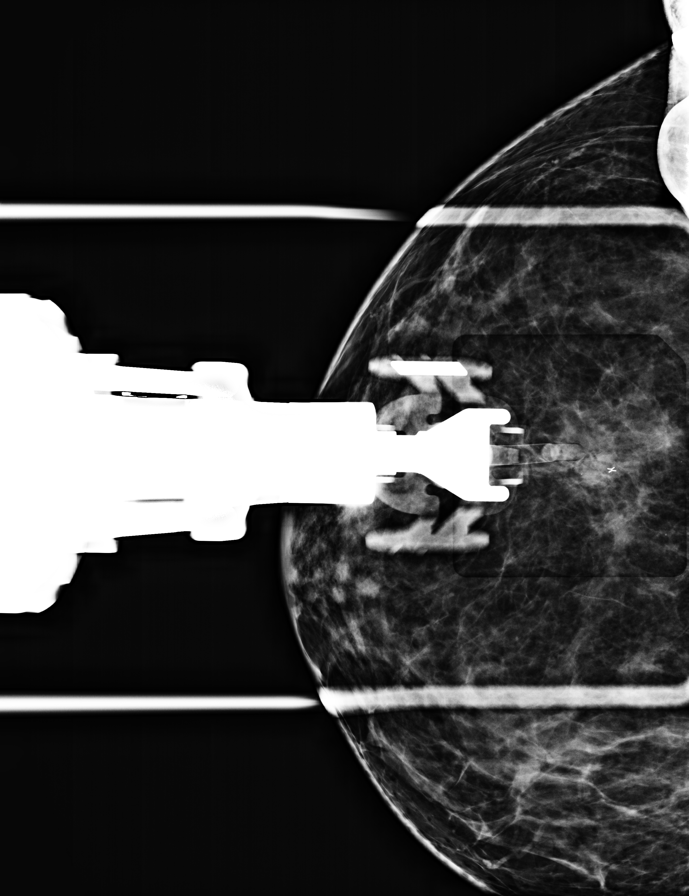

[7 of 22 positions shown; findings below may reference images not displayed]



Using sterile technique and 1% Lidocaine as local anesthetic, under
stereotactic guidance, a 9 gauge vacuum assisted device was used to
perform core needle biopsy of calcifications in the upper-outer left
breast using a superior approach. Specimen radiograph was performed
showing calcifications in several core specimens. Specimens with
calcifications are identified for pathology.

Lesion quadrant: Upper outer left breast

At the conclusion of the procedure, a coil shaped tissue marker clip
was deployed into the biopsy cavity. Follow-up 2-view mammogram was
performed and dictated separately.

Using sterile technique and 1% Lidocaine as local anesthetic, under
stereotactic guidance, a 9 gauge vacuum assisted device was used to
perform core needle biopsy of calcifications in the upper outer
right breast using a superior approach. Specimen radiograph was
performed showing calcifications in several core specimens.
Specimens with calcifications are identified for pathology.

Lesion quadrant: Upper outer right breast

At the conclusion of the procedure, an X shaped tissue marker clip
was deployed into the biopsy cavity. Follow-up 2-view mammogram was
performed and dictated separately.
IMPRESSION: Stereotactic-guided biopsy of calcifications in the upper-outer left
breast and the upper outer right breast. No apparent complications.

ADDENDUM:
Pathology revealed GRADE II INVASIVE DUCTAL CARCINOMA, DUCTAL
CARCINOMA IN SITU WITH CALCIFICATIONS AND FOCAL NECROSIS of the LEFT
breast, upper outer. This was found to be concordant by Dr. LEGGETT
LEGGETT.

Pathology revealed LOW GRADE DUCTAL CARCINOMA IN SITU of the RIGHT
breast, upper outer. This was found to be concordant by Dr. LEGGETT
LEGGETT.

Pathology results were discussed with the patient by telephone. The
patient reported doing well after the biopsies with tenderness at
the sites. Post biopsy instructions and care were reviewed and
questions were answered. The patient was encouraged to call The
direct phone number was provided.

The patient was referred to [REDACTED]
[REDACTED] at [REDACTED] on

The patient is scheduled for a LEFT breast and LEFT axillary node
biopsy at [facility] [REDACTED] on [DATE]. Further
recommendations will be guided by the results of these biopsies.

Recommendation for a bilateral breast MRI to exclude any additional
sites of disease given age and breast density.

There also appears to be mild skin thickening on the LEFT mammogram,
recommend to evaluate clinically for inflammatory cancer.

Pathology results reported by LEGGETT, RN on [DATE].



Using sterile technique and 1% Lidocaine as local anesthetic, under
stereotactic guidance, a 9 gauge vacuum assisted device was used to
perform core needle biopsy of calcifications in the upper-outer left
breast using a superior approach. Specimen radiograph was performed
showing calcifications in several core specimens. Specimens with
calcifications are identified for pathology.

Lesion quadrant: Upper outer left breast

At the conclusion of the procedure, a coil shaped tissue marker clip
was deployed into the biopsy cavity. Follow-up 2-view mammogram was
performed and dictated separately.

Using sterile technique and 1% Lidocaine as local anesthetic, under
stereotactic guidance, a 9 gauge vacuum assisted device was used to
perform core needle biopsy of calcifications in the upper outer
right breast using a superior approach. Specimen radiograph was
performed showing calcifications in several core specimens.
Specimens with calcifications are identified for pathology.

Lesion quadrant: Upper outer right breast

At the conclusion of the procedure, an X shaped tissue marker clip
was deployed into the biopsy cavity. Follow-up 2-view mammogram was
performed and dictated separately.
IMPRESSION: Stereotactic-guided biopsy of calcifications in the upper-outer left
breast and the upper outer right breast. No apparent complications.

## 2020-09-25 IMAGING — MG MM BREAST BX W LOC DEV 1ST LESION IMAGE BX SPEC STEREO GUIDE*L*
3 series · 3 of 11 positions shown · non-contrast
Comparison: Previous exams.
COMPARISON: Previous exams.

Addendum:
CLINICAL DATA: Biopsy of bilateral breast calcifications

EXAM:
BREAST STEREOTACTIC CORE NEEDLE BIOPSY

[L CC]
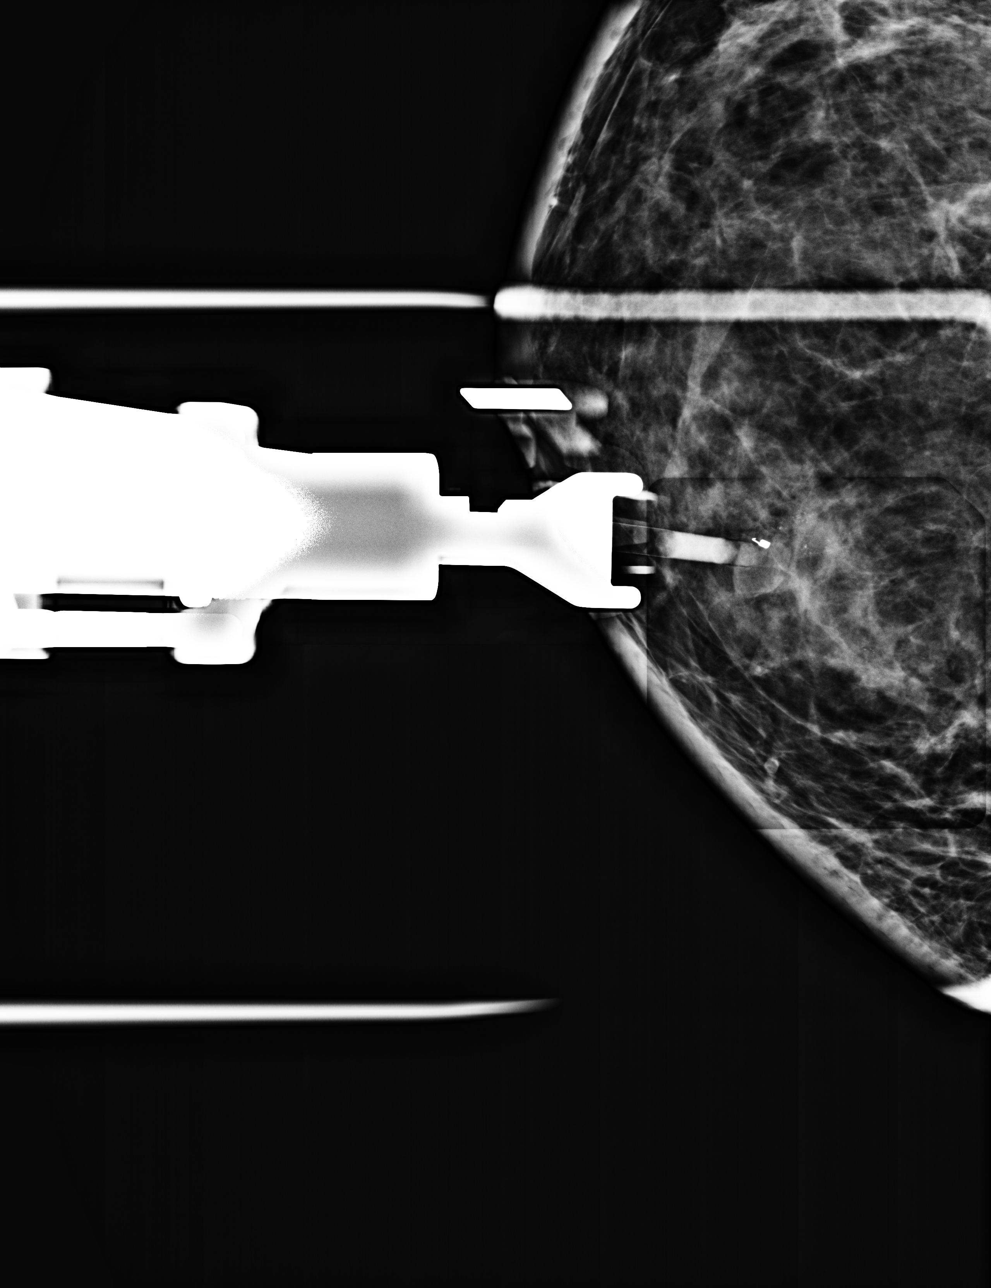

[L CC tomo (1 of 2) · tomo slice 43/85.0]
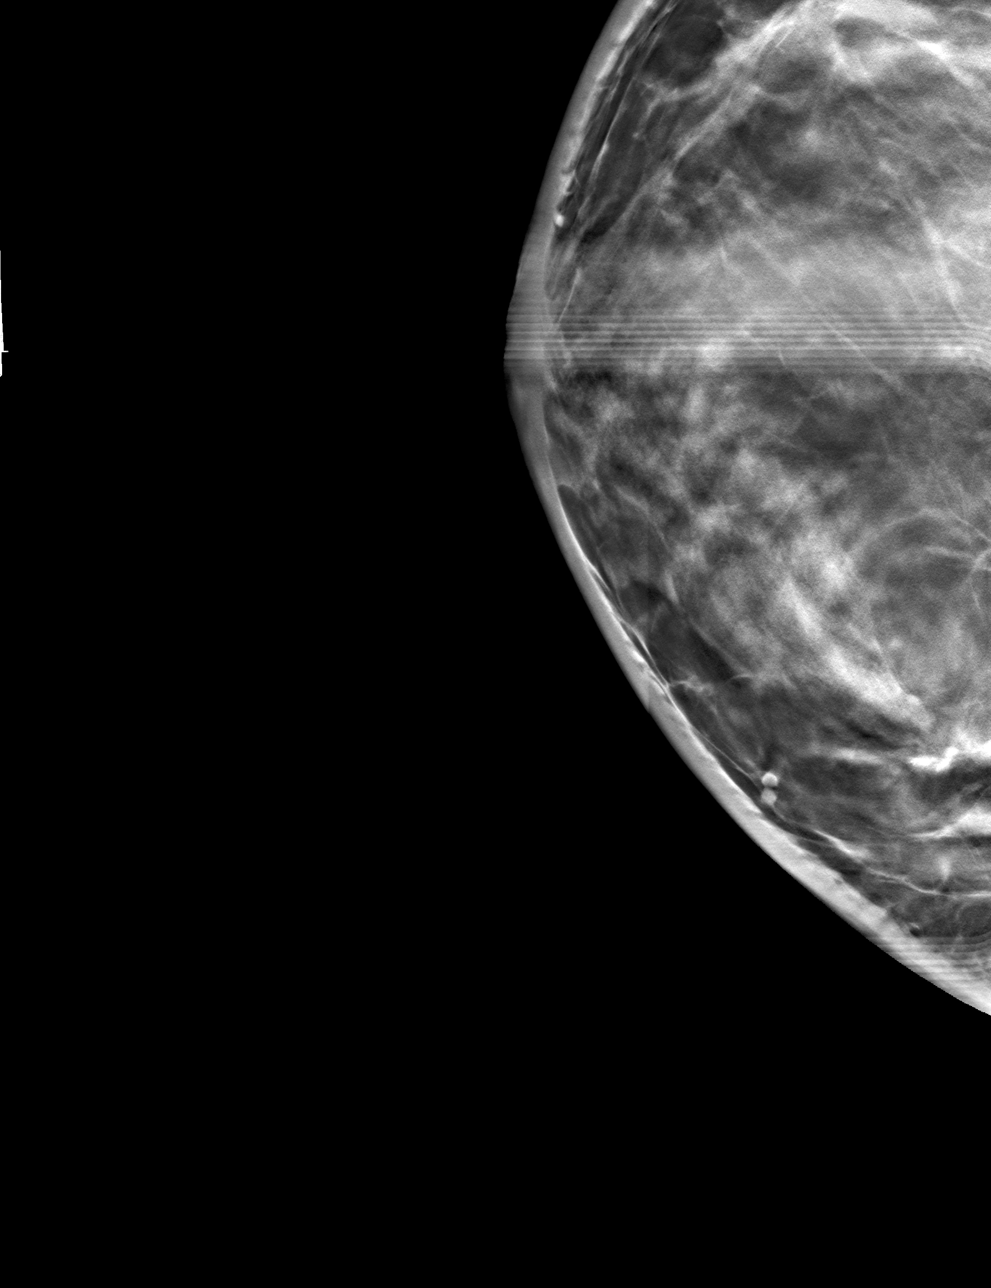

[L CC tomo (2 of 2) · tomo slice 43/84.0]
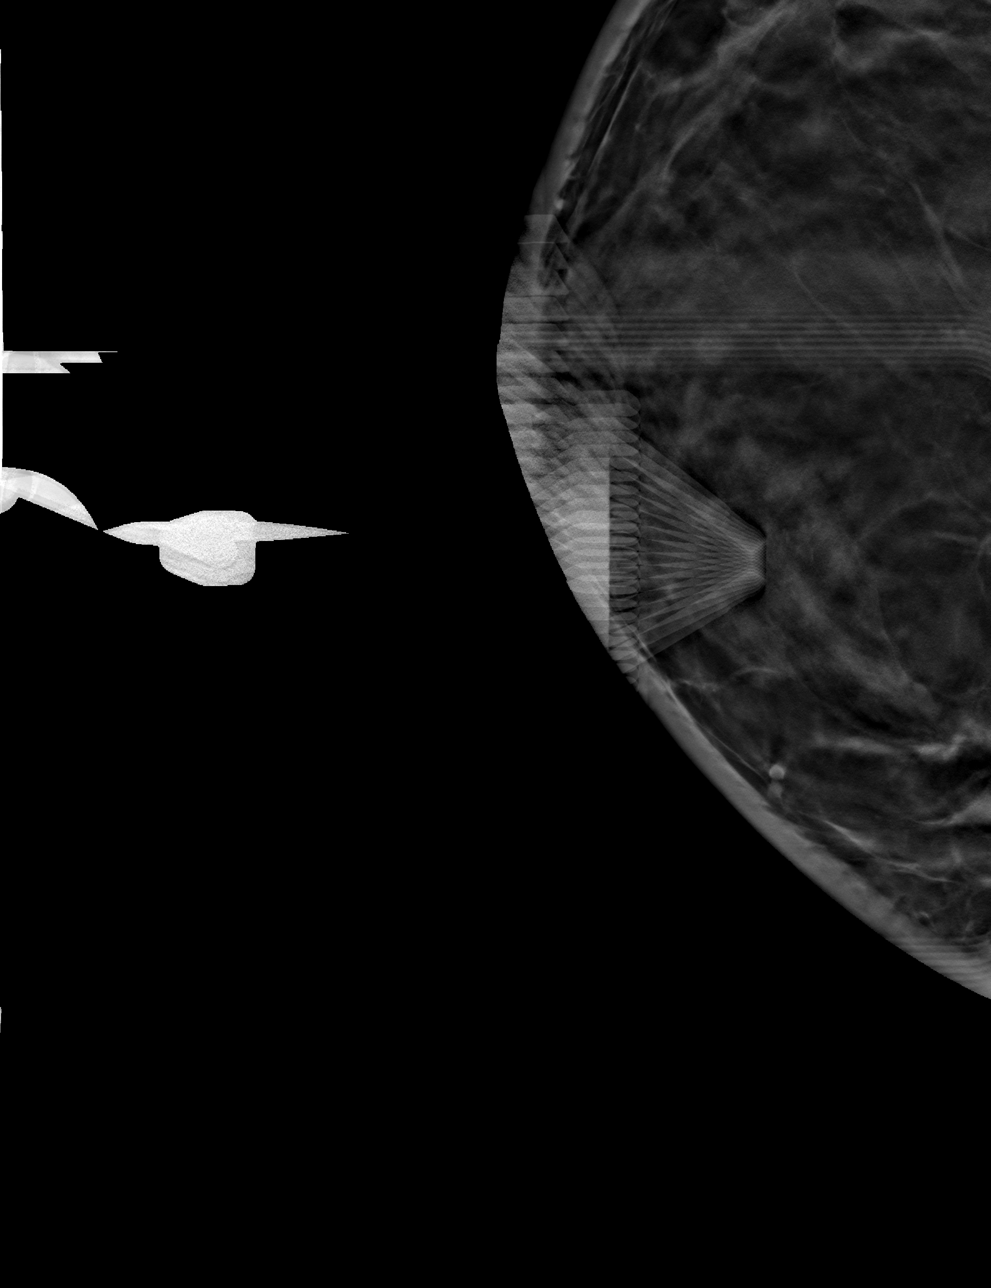

[3 of 11 positions shown; findings below may reference images not displayed]



Using sterile technique and 1% Lidocaine as local anesthetic, under
stereotactic guidance, a 9 gauge vacuum assisted device was used to
perform core needle biopsy of calcifications in the upper-outer left
breast using a superior approach. Specimen radiograph was performed
showing calcifications in several core specimens. Specimens with
calcifications are identified for pathology.

Lesion quadrant: Upper outer left breast

At the conclusion of the procedure, a coil shaped tissue marker clip
was deployed into the biopsy cavity. Follow-up 2-view mammogram was
performed and dictated separately.

Using sterile technique and 1% Lidocaine as local anesthetic, under
stereotactic guidance, a 9 gauge vacuum assisted device was used to
perform core needle biopsy of calcifications in the upper outer
right breast using a superior approach. Specimen radiograph was
performed showing calcifications in several core specimens.
Specimens with calcifications are identified for pathology.

Lesion quadrant: Upper outer right breast

At the conclusion of the procedure, an X shaped tissue marker clip
was deployed into the biopsy cavity. Follow-up 2-view mammogram was
performed and dictated separately.
IMPRESSION: Stereotactic-guided biopsy of calcifications in the upper-outer left
breast and the upper outer right breast. No apparent complications.

ADDENDUM:
Pathology revealed GRADE II INVASIVE DUCTAL CARCINOMA, DUCTAL
CARCINOMA IN SITU WITH CALCIFICATIONS AND FOCAL NECROSIS of the LEFT
breast, upper outer. This was found to be concordant by Dr. LEGGETT
LEGGETT.

Pathology revealed LOW GRADE DUCTAL CARCINOMA IN SITU of the RIGHT
breast, upper outer. This was found to be concordant by Dr. LEGGETT
LEGGETT.

Pathology results were discussed with the patient by telephone. The
patient reported doing well after the biopsies with tenderness at
the sites. Post biopsy instructions and care were reviewed and
questions were answered. The patient was encouraged to call The
direct phone number was provided.

The patient was referred to [REDACTED]
[REDACTED] at [REDACTED] on

The patient is scheduled for a LEFT breast and LEFT axillary node
biopsy at The [REDACTED] on [DATE]. Further
recommendations will be guided by the results of these biopsies.

Recommendation for a bilateral breast MRI to exclude any additional
sites of disease given age and breast density.

There also appears to be mild skin thickening on the LEFT mammogram,
recommend to evaluate clinically for inflammatory cancer.

Pathology results reported by LEGGETT, RN on [DATE].



Using sterile technique and 1% Lidocaine as local anesthetic, under
stereotactic guidance, a 9 gauge vacuum assisted device was used to
perform core needle biopsy of calcifications in the upper-outer left
breast using a superior approach. Specimen radiograph was performed
showing calcifications in several core specimens. Specimens with
calcifications are identified for pathology.

Lesion quadrant: Upper outer left breast

At the conclusion of the procedure, a coil shaped tissue marker clip
was deployed into the biopsy cavity. Follow-up 2-view mammogram was
performed and dictated separately.

Using sterile technique and 1% Lidocaine as local anesthetic, under
stereotactic guidance, a 9 gauge vacuum assisted device was used to
perform core needle biopsy of calcifications in the upper outer
right breast using a superior approach. Specimen radiograph was
performed showing calcifications in several core specimens.
Specimens with calcifications are identified for pathology.

Lesion quadrant: Upper outer right breast

At the conclusion of the procedure, an X shaped tissue marker clip
was deployed into the biopsy cavity. Follow-up 2-view mammogram was
performed and dictated separately.
IMPRESSION: Stereotactic-guided biopsy of calcifications in the upper-outer left
breast and the upper outer right breast. No apparent complications.

## 2020-09-25 IMAGING — MG MM BREAST LOCALIZATION CLIP
4 series · 4 of 12 positions shown · non-contrast
Comparison: Previous exam(s).

CLINICAL DATA: Evaluate biopsy markers

EXAM:
DIAGNOSTIC BILATERAL MAMMOGRAM POST STEREOTACTIC BIOPSY

[L CC synth-2D]
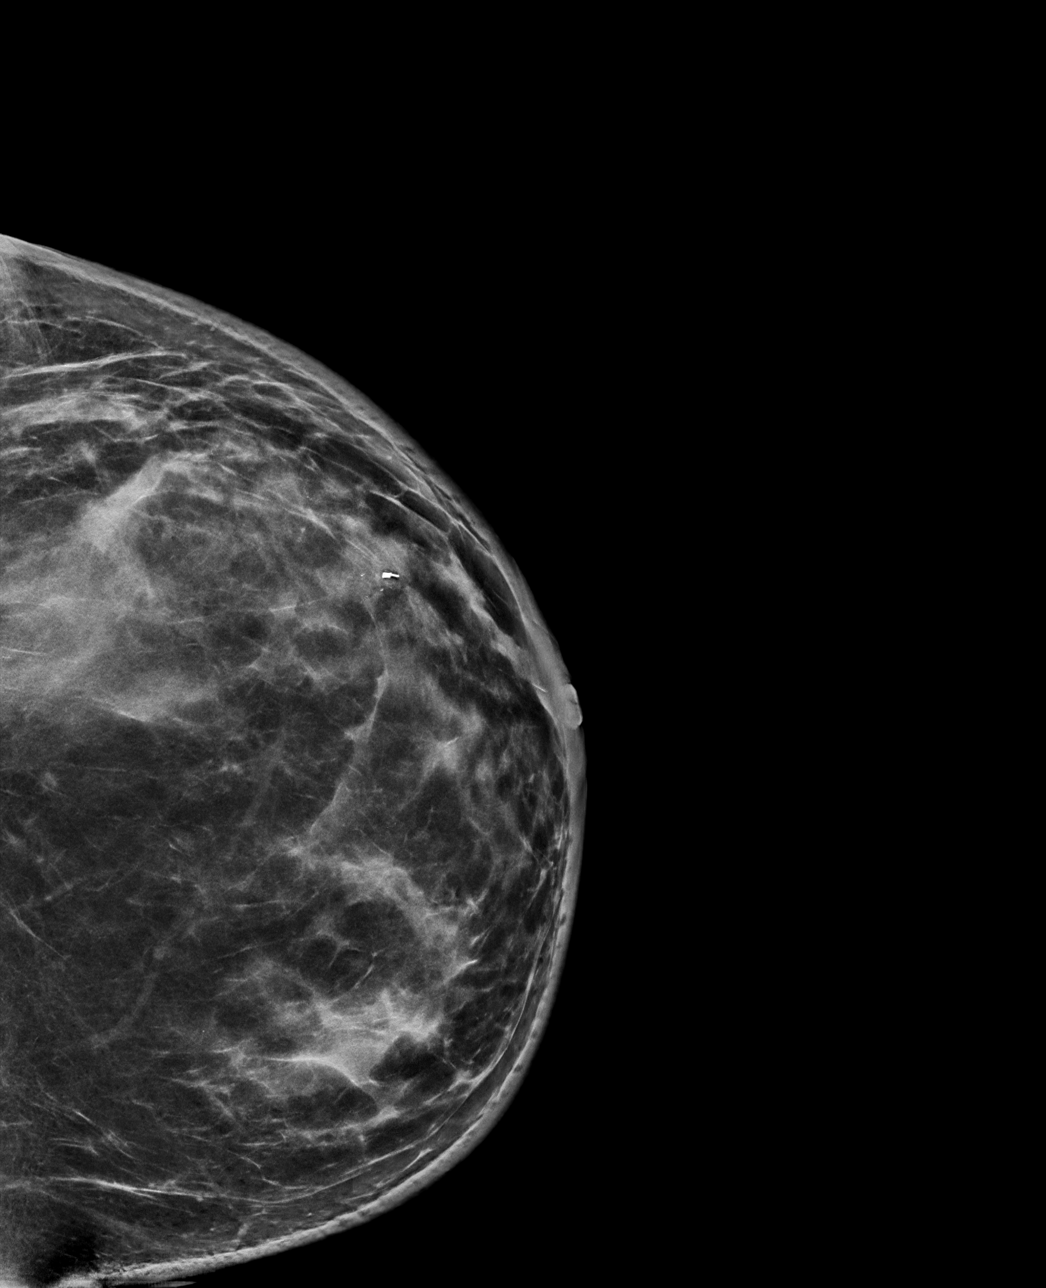

[L ML synth-2D]
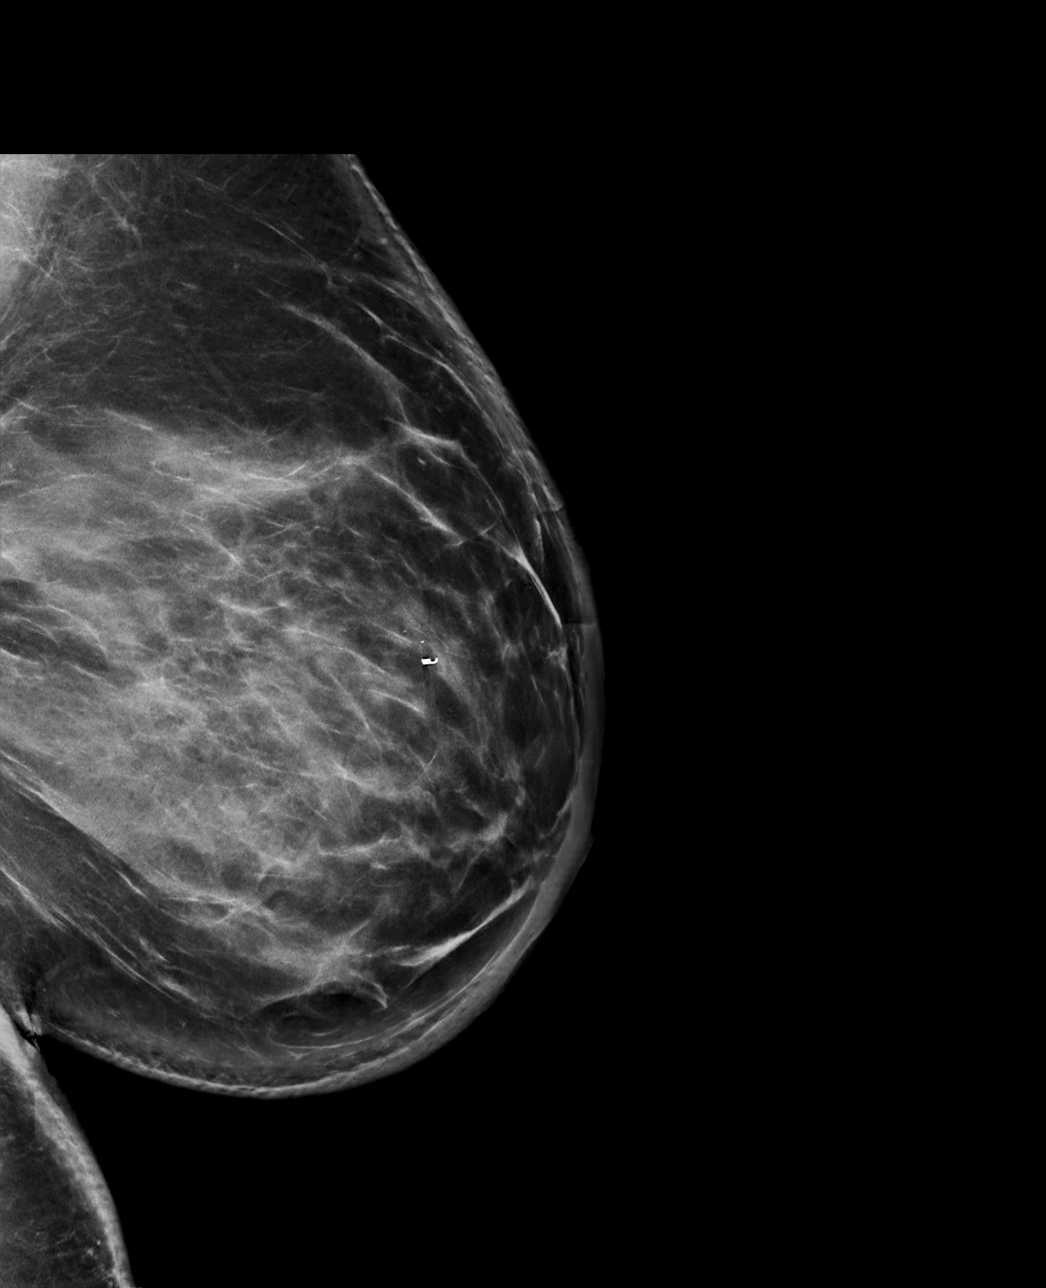

[L CC tomo · tomo slice 51/102.0]
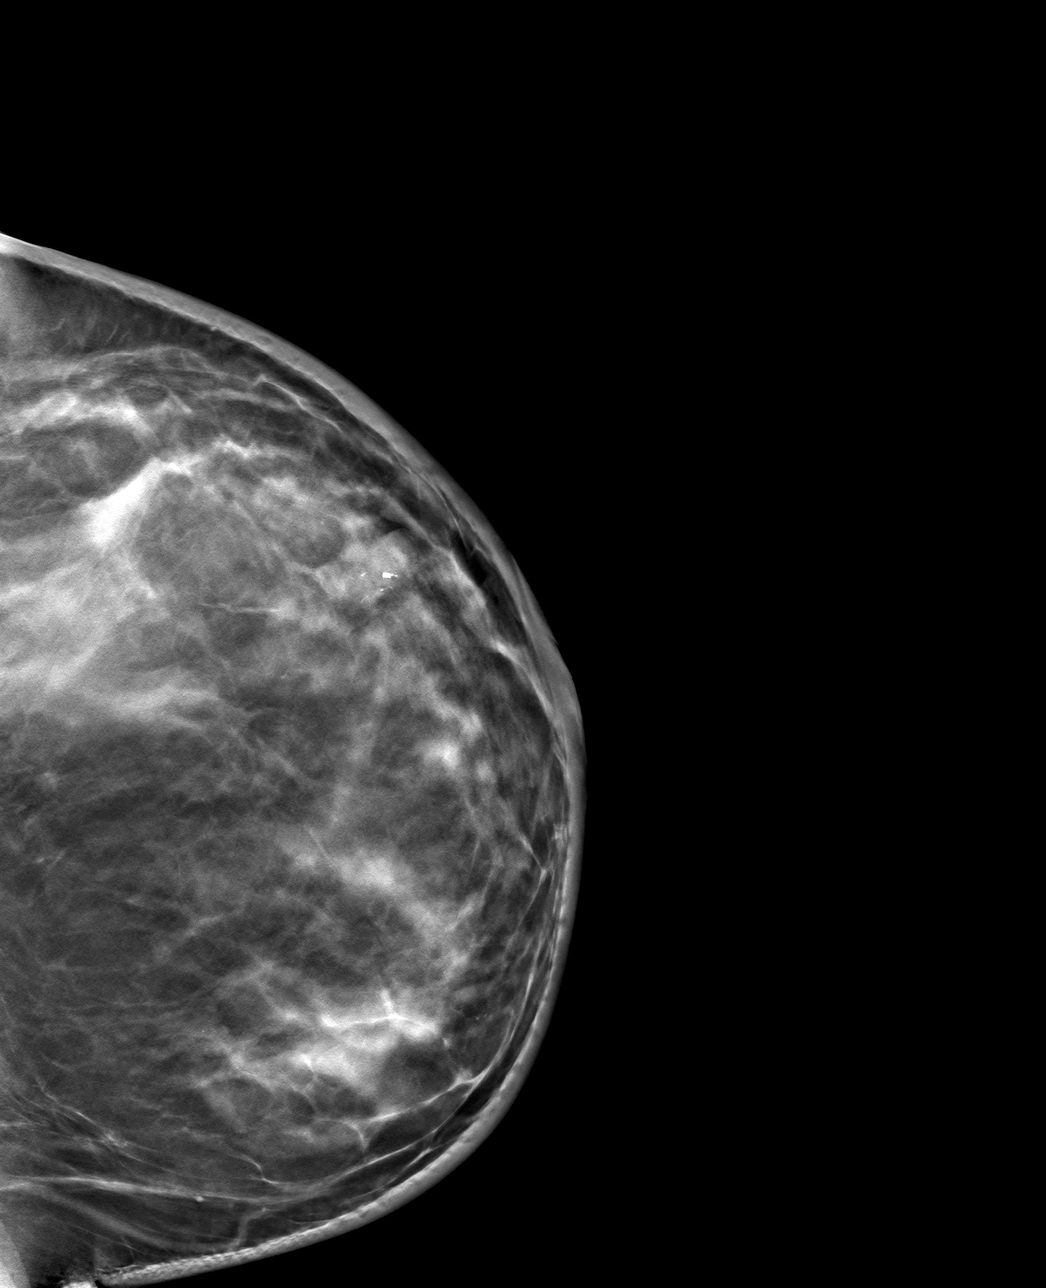

[L ML tomo · tomo slice 59/116.0]
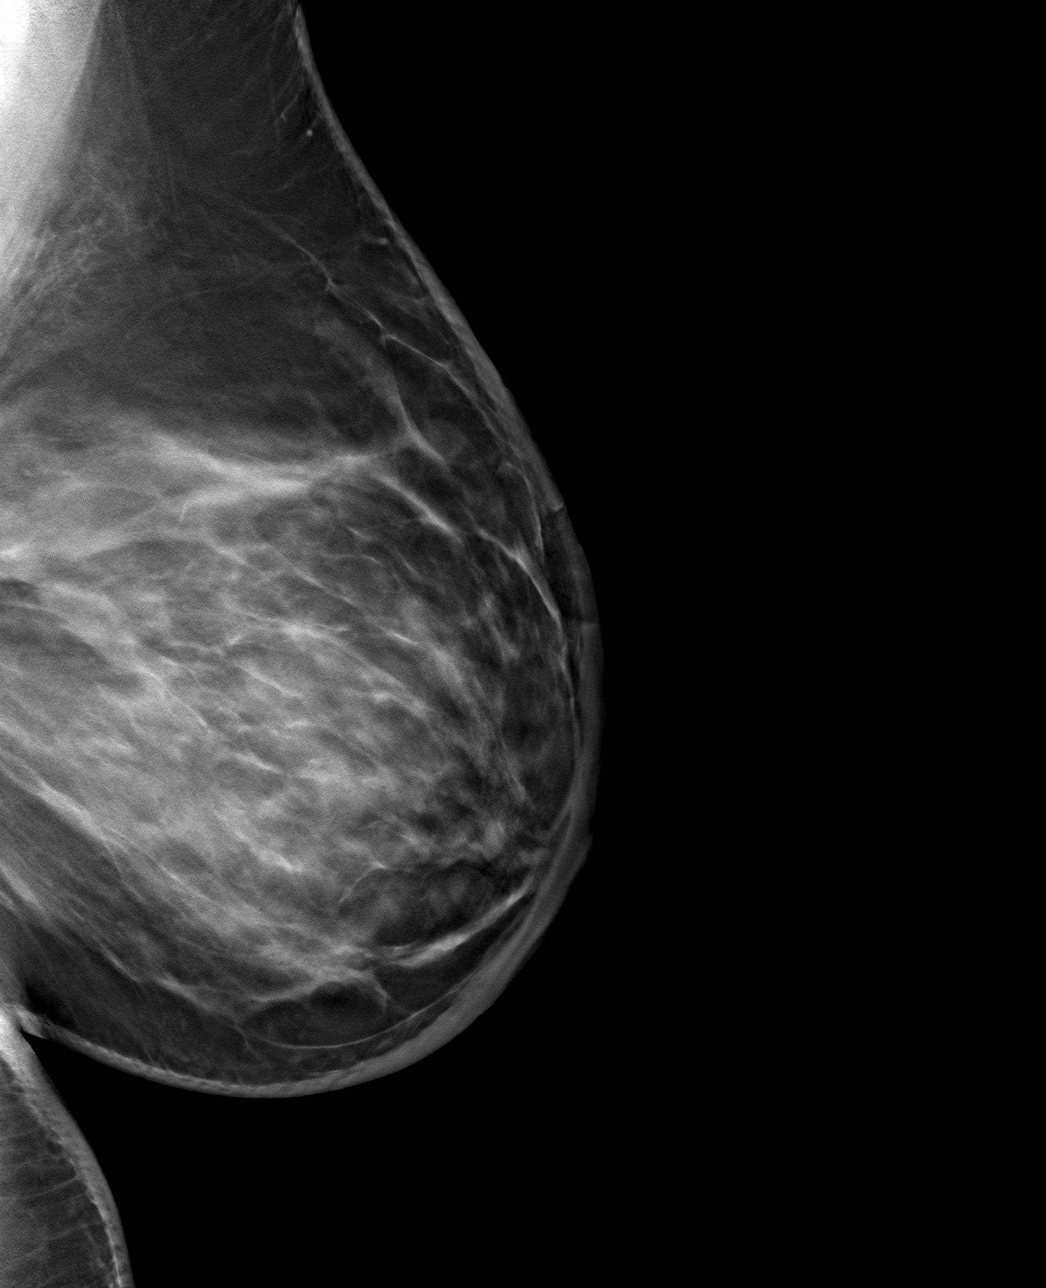

[4 of 12 positions shown; findings below may reference images not displayed]

FINDINGS: Mammographic images were obtained following stereotactic guided
biopsy of bilateral breast calcifications. The biopsy marking clips
are in expected position at the site of biopsy.
IMPRESSION: The X shaped clip is in the approximate location of the biopsied
right breast calcifications. The coil shaped clip is in the region
of the biopsied left breast calcifications.

Final Assessment: Post Procedure Mammograms for Marker Placement

## 2020-09-26 ENCOUNTER — Other Ambulatory Visit: Payer: BLUE CROSS/BLUE SHIELD

## 2020-09-26 ENCOUNTER — Ambulatory Visit
Admission: RE | Admit: 2020-09-26 | Discharge: 2020-09-26 | Disposition: A | Discharge status: Home / Self Care | Payer: No Typology Code available for payment source | Source: Ambulatory Visit | Attending: Family Medicine | Admitting: Family Medicine

## 2020-09-26 ENCOUNTER — Other Ambulatory Visit: Payer: Self-pay | Admitting: Family Medicine

## 2020-09-26 DIAGNOSIS — R928 Other abnormal and inconclusive findings on diagnostic imaging of breast: Secondary | ICD-10-CM

## 2020-09-26 IMAGING — US US BREAST BX W LOC DEV 1ST LESION IMG BX SPEC US GUIDE*L*
1 series · 12 of 15 positions shown · non-contrast
Comparison: Previous exam(s).
COMPARISON: Previous exam(s).

Addendum:
CLINICAL DATA: Patient with suspicious left breast mass and
cortically thickened left axillary lymph node.

EXAM:
ULTRASOUND GUIDED LEFT BREAST CORE NEEDLE BIOPSY

[Series 1: us breast bx w loc dev 1st lesion img bx spec us g · 0.07mm/px · 12 of 15 slices shown]
[im 1/15]
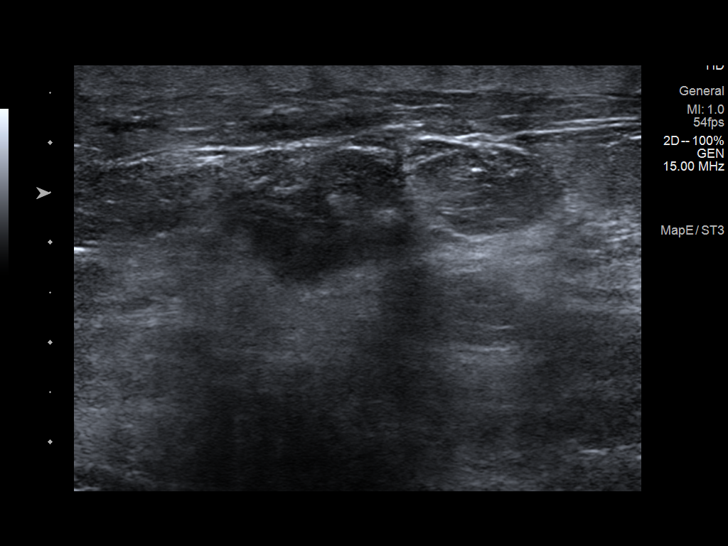
[im 2/15]
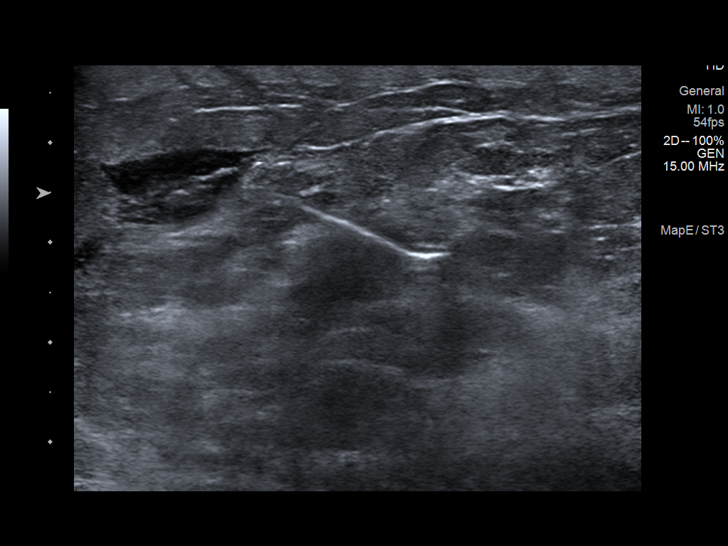
[im 4/15]
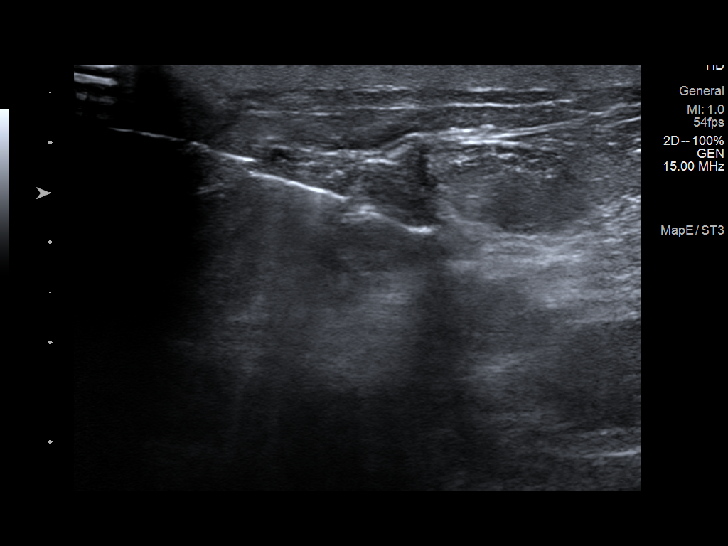
[im 5/15]
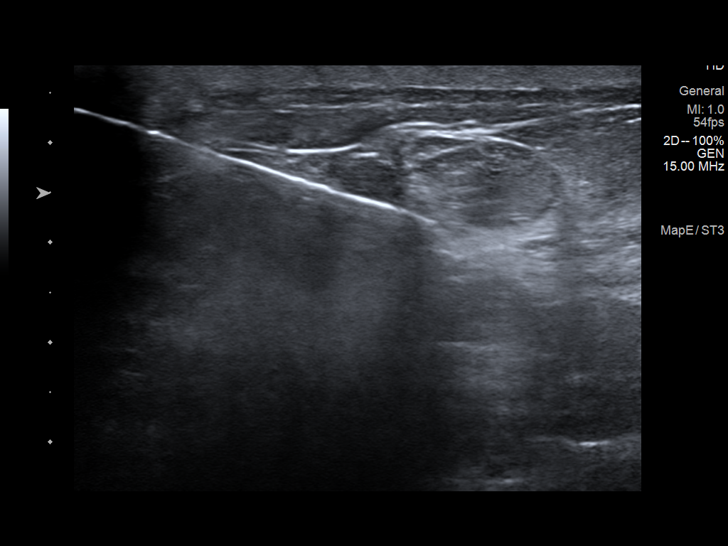
[im 6/15]
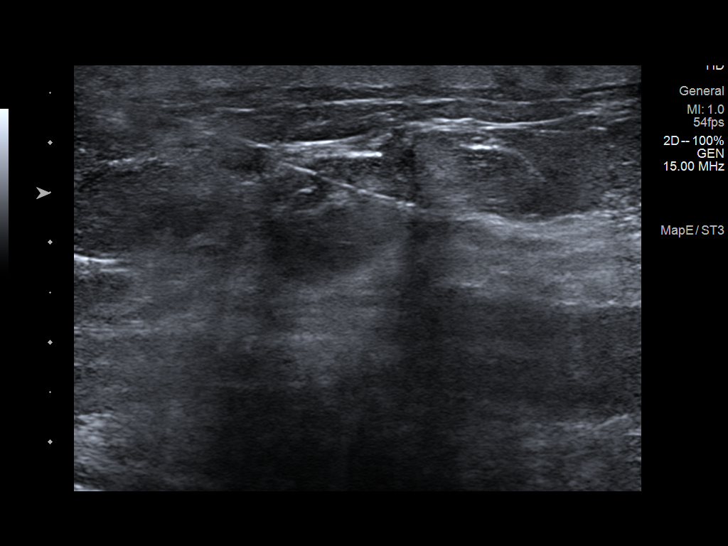
[im 7/15]
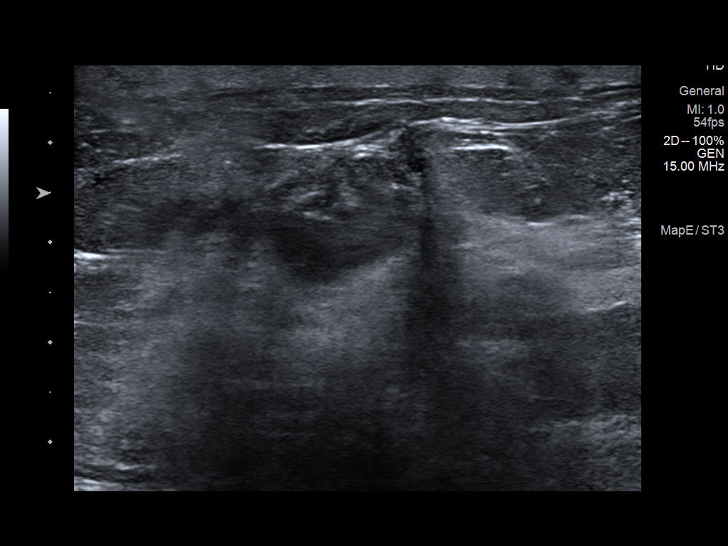
[im 9/15]
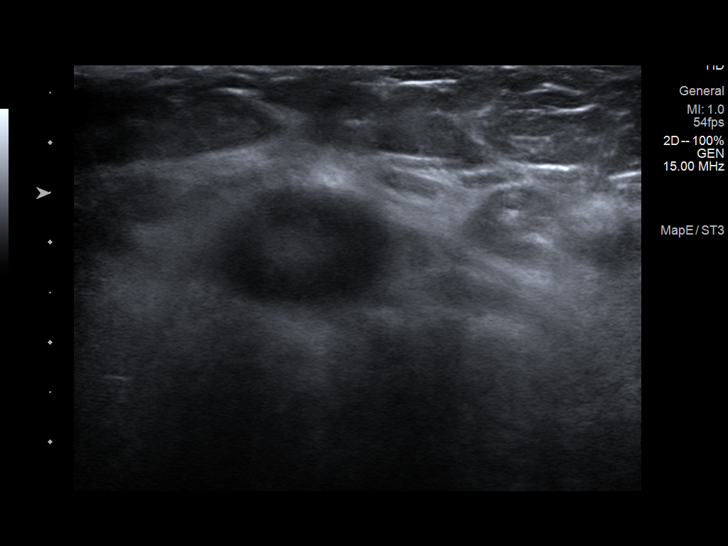
[im 10/15]
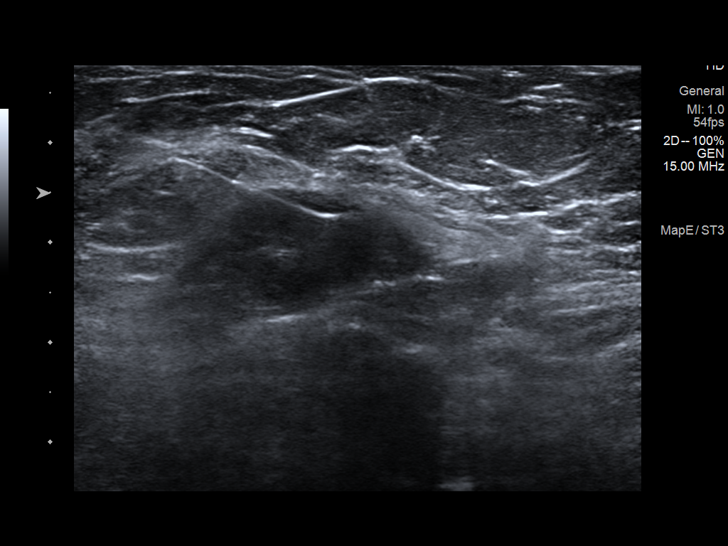
[im 11/15]
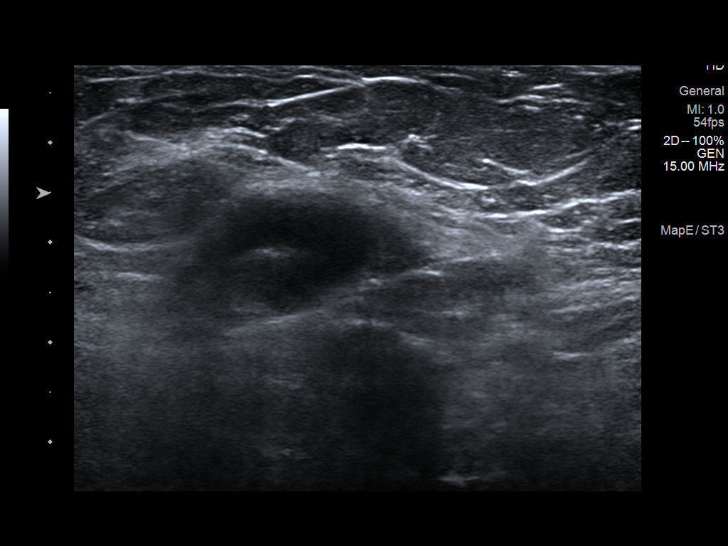
[im 12/15]
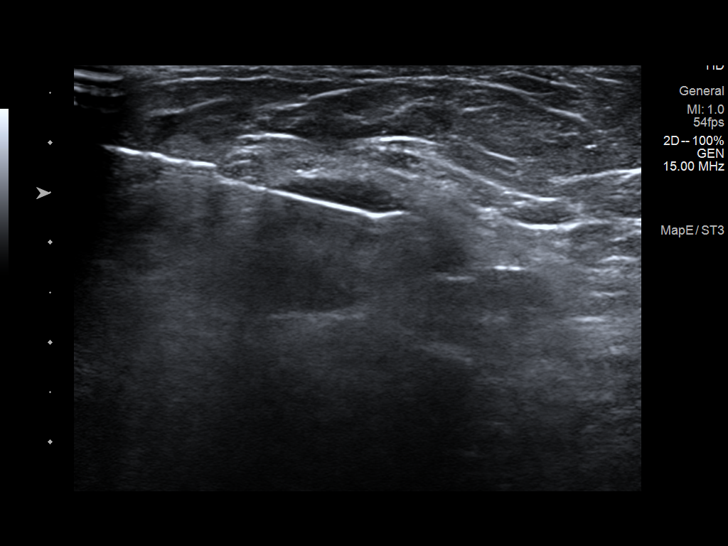
[im 14/15]
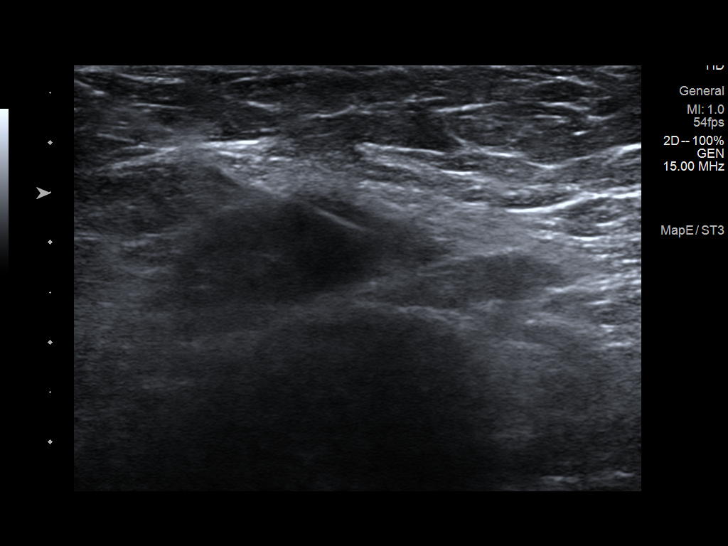
[im 15/15]
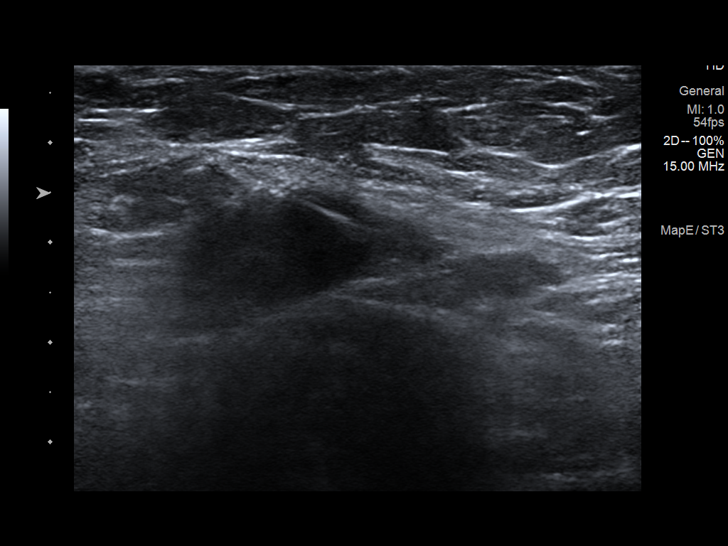

[12 of 15 positions shown; findings below may reference images not displayed]



Site 1: Left breast mass 3 o'clock position

Lesion quadrant: Upper outer quadrant

Using sterile technique and 1% Lidocaine as local anesthetic, under
direct ultrasound visualization, a 14 gauge CAVALLARO device was
used to perform biopsy of left breast mass 3 o'clock position using
a lateral approach. At the conclusion of the procedure heart shaped
tissue marker clip was deployed into the biopsy cavity. Follow up 2
view mammogram was performed and dictated separately.

Site 2: Left axillary lymph node

Lesion quadrant: Upper outer quadrant

Using sterile technique and 1% Lidocaine as local anesthetic, under
direct ultrasound visualization, a 14 gauge CAVALLARO device was
used to perform biopsy of left axillary lymph node using a lateral
approach. At the conclusion of the procedure tribell tissue marker
clip was deployed into the biopsy cavity. Follow up 2 view mammogram
was performed and dictated separately.
IMPRESSION: Ultrasound guided biopsy of left breast mass and left axillary lymph
node. No apparent complications.

ADDENDUM:
Pathology revealed GRADE II INVASIVE DUCTAL CARCINOMA, DUCTAL
CARCINOMA IN SITU of the LEFT breast, 3 o'clock. This was found to
be concordant by Dr. CAVALLARO.

Pathology revealed METASTATIC CARCINOMA of the LEFT axillary lymph
node. This was found to be concordant by Dr. CAVALLARO.

Pathology results were discussed with the patient by telephone. The
patient reported doing well after the biopsies with tenderness at
the sites. Post biopsy instructions and care were reviewed and
questions were answered. The patient was encouraged to call The
direct phone number was provided.

The patient has a recent diagnosis of BILATERAL breast cancer and
was referred to [REDACTED] [REDACTED] at

Recommendation for a bilateral breast MRI to exclude any additional
sites of disease given age and breast density.

There also appears to be mild skin thickening on the LEFT mammogram,
recommend to evaluate clinically for inflammatory cancer.

Pathology results reported by CAVALLARO, RN on [DATE].



Site 1: Left breast mass 3 o'clock position

Lesion quadrant: Upper outer quadrant

Using sterile technique and 1% Lidocaine as local anesthetic, under
direct ultrasound visualization, a 14 gauge CAVALLARO device was
used to perform biopsy of left breast mass 3 o'clock position using
a lateral approach. At the conclusion of the procedure heart shaped
tissue marker clip was deployed into the biopsy cavity. Follow up 2
view mammogram was performed and dictated separately.

Site 2: Left axillary lymph node

Lesion quadrant: Upper outer quadrant

Using sterile technique and 1% Lidocaine as local anesthetic, under
direct ultrasound visualization, a 14 gauge CAVALLARO device was
used to perform biopsy of left axillary lymph node using a lateral
approach. At the conclusion of the procedure tribell tissue marker
clip was deployed into the biopsy cavity. Follow up 2 view mammogram
was performed and dictated separately.
IMPRESSION: Ultrasound guided biopsy of left breast mass and left axillary lymph
node. No apparent complications.

## 2020-09-26 IMAGING — MG MM BREAST LOCALIZATION CLIP
5 series · 6 of 13 positions shown · non-contrast
Comparison: Previous exam(s).

CLINICAL DATA: Patient status post ultrasound-guided core needle
biopsy left breast mass 3 o'clock position and cortically thickened
left axillary lymph node.

EXAM:
DIAGNOSTIC LEFT MAMMOGRAM POST ULTRASOUND BIOPSY

[L CC synth-2D]
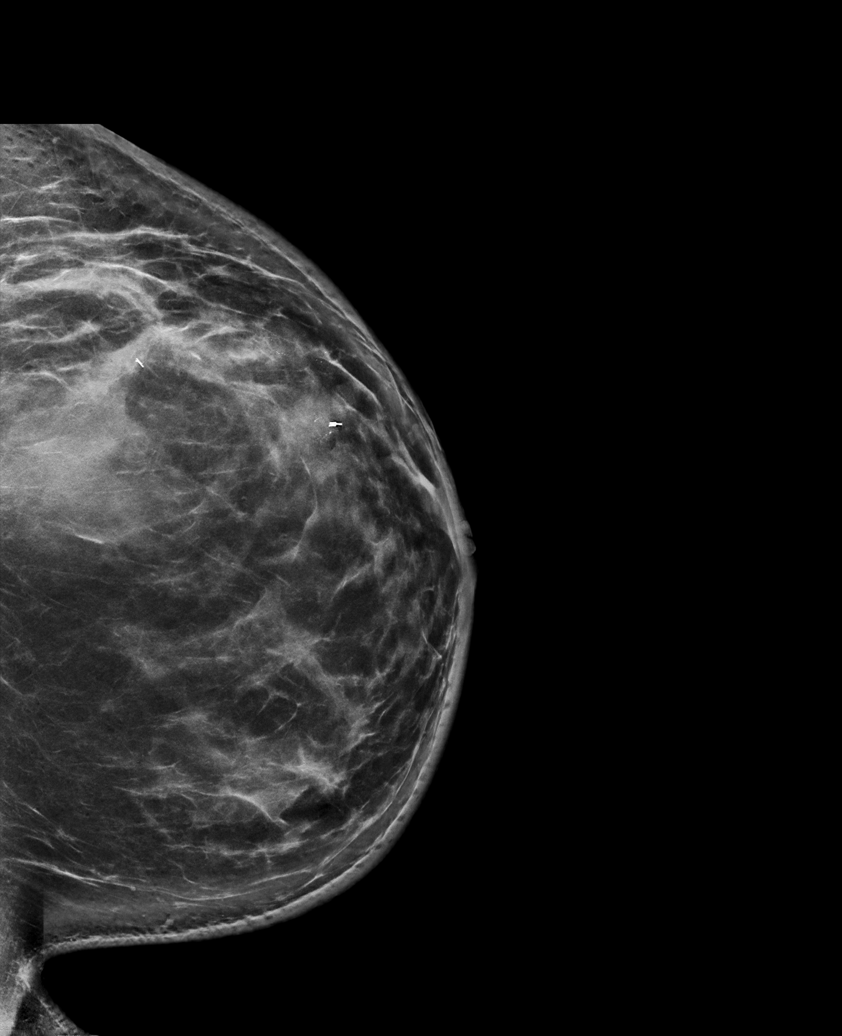

[L ML synth-2D (1 of 2)]
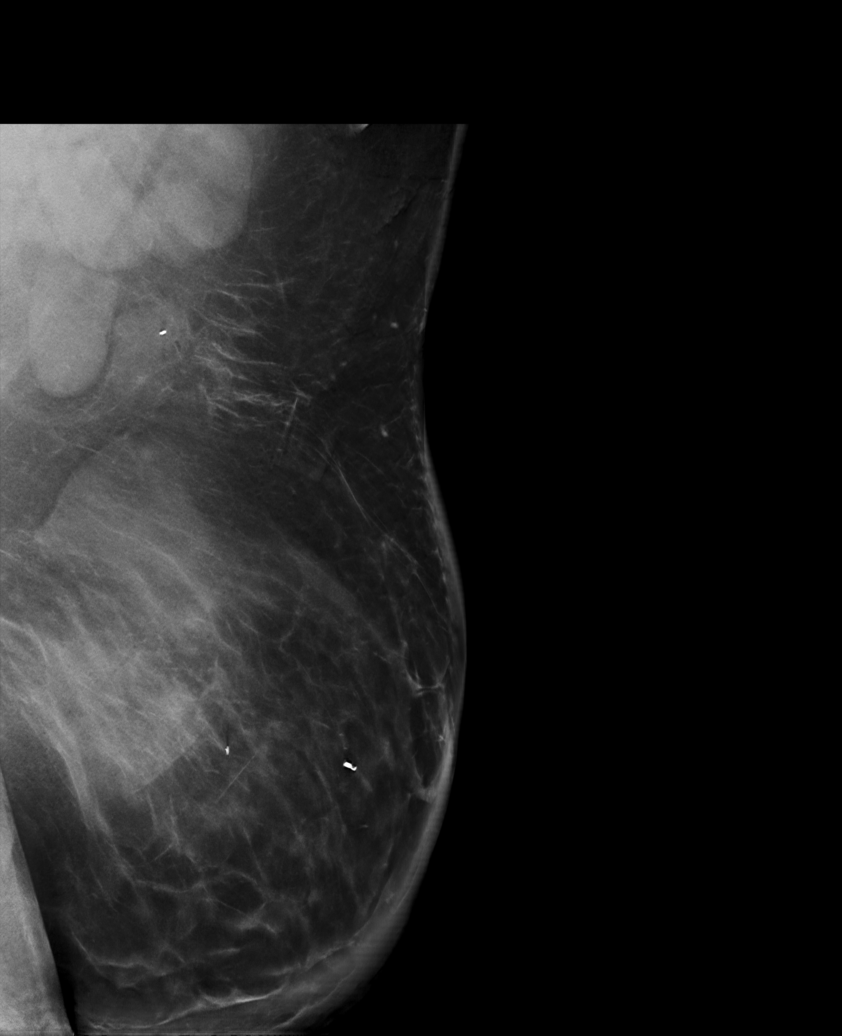

[L ML synth-2D (2 of 2)]
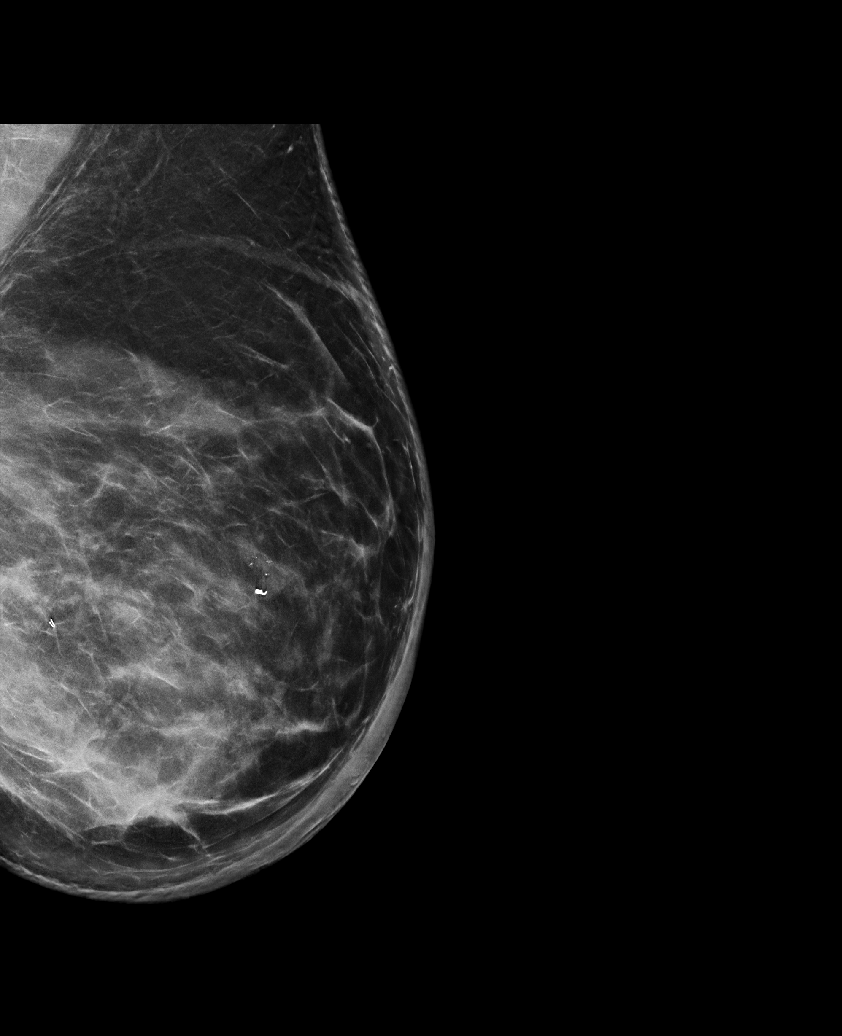

[L CC tomo · 2 of 111 frames shown]
[frame 36/111]
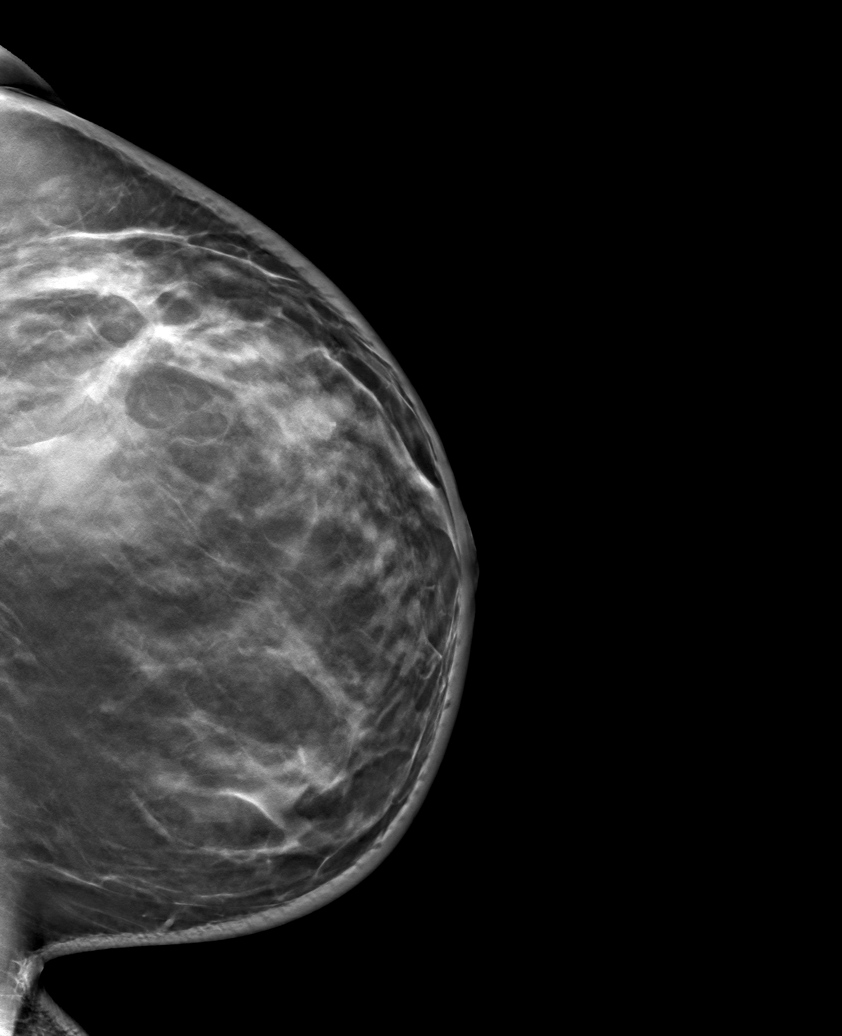
[frame 56/111]
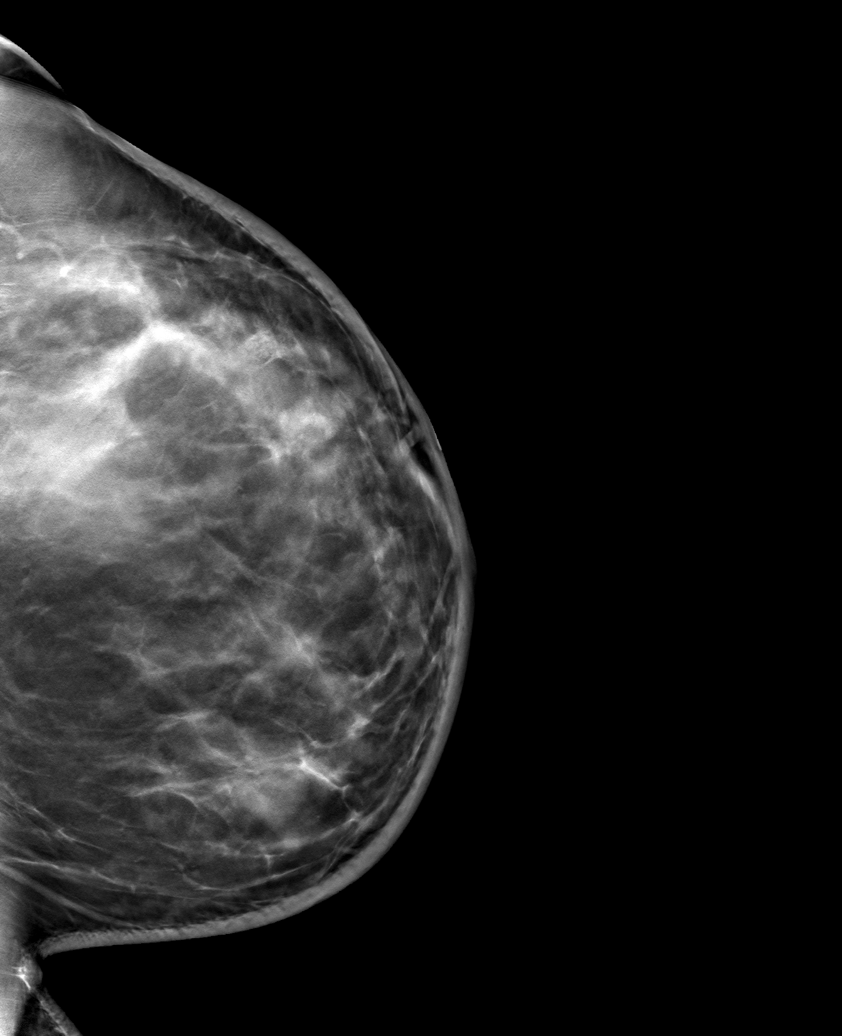

[L ML tomo · tomo slice 56/111.0]
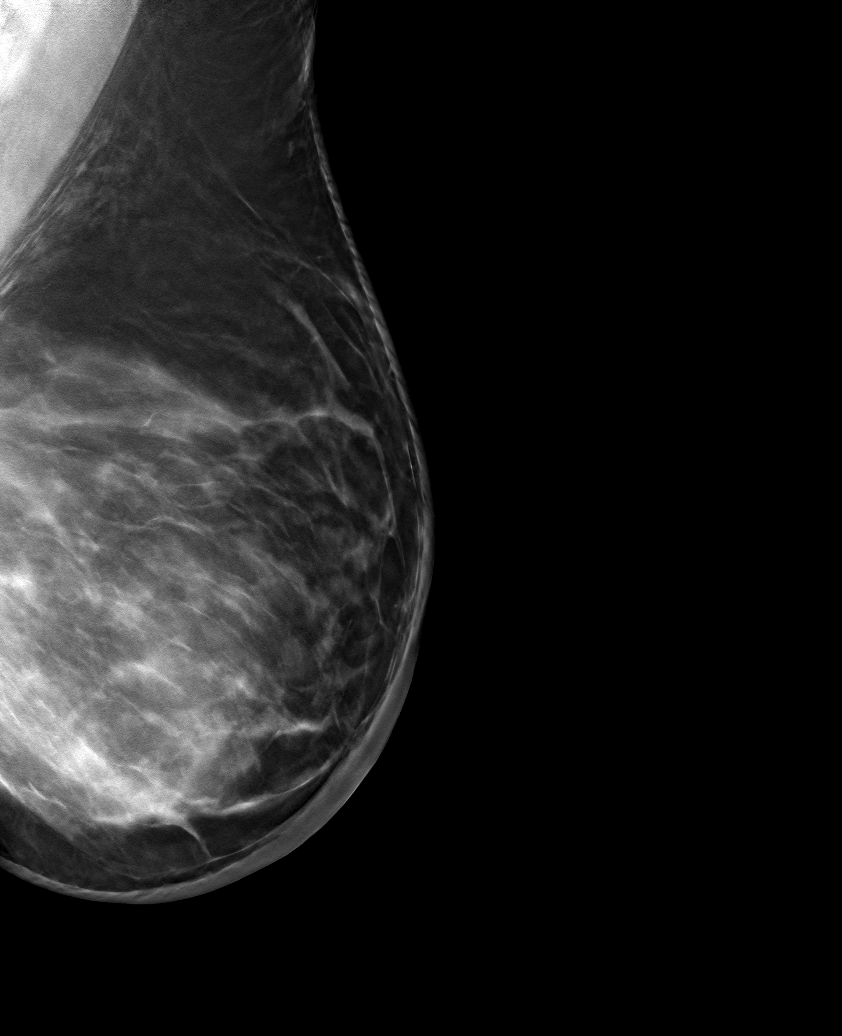

[6 of 13 positions shown; findings below may reference images not displayed]

FINDINGS: Mammographic images were obtained following ultrasound guided biopsy
of left breast mass 3 o'clock position and cortically thickened left
axillary lymph node.

Site 1: Left breast mass 3 o'clock position: Heart shaped clip: In
appropriate position.

Site 2: Left axillary lymph node: Tribell clip: In appropriate
position.

Previously placed coil clip is redemonstrated.
IMPRESSION: Appropriate positioning of the biopsy marking clips as above.

Final Assessment: Post Procedure Mammograms for Marker Placement

## 2020-09-27 ENCOUNTER — Other Ambulatory Visit: Payer: Self-pay | Admitting: Family Medicine

## 2020-09-27 DIAGNOSIS — C50912 Malignant neoplasm of unspecified site of left female breast: Secondary | ICD-10-CM

## 2020-09-27 DIAGNOSIS — C50911 Malignant neoplasm of unspecified site of right female breast: Secondary | ICD-10-CM

## 2020-09-28 ENCOUNTER — Telehealth: Payer: Self-pay | Admitting: Oncology

## 2020-09-28 NOTE — Telephone Encounter (Signed)
Spoke to patient to confirm morning [facility] appointment for 5/4, packet emailed to patient

## 2020-09-29 ENCOUNTER — Encounter: Payer: Self-pay | Admitting: *Deleted

## 2020-09-29 DIAGNOSIS — Z170 Estrogen receptor positive status [ER+]: Secondary | ICD-10-CM

## 2020-09-29 DIAGNOSIS — C50412 Malignant neoplasm of upper-outer quadrant of left female breast: Secondary | ICD-10-CM

## 2020-09-29 DIAGNOSIS — D0511 Intraductal carcinoma in situ of right breast: Secondary | ICD-10-CM | POA: Insufficient documentation

## 2020-10-03 NOTE — Progress Notes (Signed)
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  She ignored it.  Around September 2021 this mass was "golf sized" and she brought it to her mother's attention.  She did not bring it to medical attention until several months later and she finally underwent bilateral diagnostic mammography with tomography and left breast ultrasonography at [facility] on 09/12/2020 showing: breast density category C; 5.1 cm ill-defined outer left breast mass corresponding to patient's palpable abnormality; at least 4 abnormal left axillary lymph nodes with cortical thickening; indeterminate 0.8 cm group of outer right breast calcifications; indeterminate 6 cm upper-outer left breast calcifications.  Accordingly on 09/26/2020 she proceeded to biopsy of the bilateral breast areas in question. The pathology from this procedure ([id number]) showed:  1. Left Breast, upper-outer  - invasive ductal carcinoma, grade 2.  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 Proliferation marker Ki67 at 10%. HER2 equivocal by immunohistochemistry (2+), but  negative by fluorescent in situ hybridization with a signals ratio 1.61 and number per cell 2.65.  2. 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  She underwent additional biopsies on 09/26/2020. Pathology from the procedure ([id number]) showed: 1. Left Breast, 3 o'clock  - invasive ductal carcinoma, grade 2  - ductal carcinoma in situ  - Prognostic indicators significant for: estrogen receptor, 60% positive with moderate staining intensity and progesterone receptor, 10% positive with strong staining intensity. Proliferation marker Ki67 at 15%. HER2 equivocal by immunohistochemistry (2+), but negative by fluorescent in situ hybridization with a signals ratio 1.66 and number per cell 2.65. 2. Left Axilla Lymph Node  - metastatic carcinoma  Cancer Staging Ductal carcinoma in situ (DCIS) of right breast Staging form: Breast, AJCC 8th Edition - Clinical: Stage 0 (cTis (DCIS), cN0, cM0) - Signed by Gabriela Cruel, MD on 10/04/2020  Malignant neoplasm of upper-outer quadrant of left breast in female, estrogen receptor positive ([place]) Staging form: Breast, AJCC 8th Edition - Clinical stage from 10/04/2020: Stage IIA (cT3, cN1(f), cM0, G2, ER+, PR+, HER2-) - Signed by Gabriela Cruel, MD on 10/04/2020 Stage prefix: Initial diagnosis Method of lymph node assessment: Core biopsy Histologic grading system: 3 grade system  The patient's subsequent history is as detailed below.   INTERVAL HISTORY: Gabriela Sullivan was evaluated in the multidisciplinary breast cancer clinic on 10/04/2020 accompanied by her friend Gabriela Sullivan.Marland Kitchen Her case was also presented at the multidisciplinary breast cancer conference on the same day. At that time a preliminary plan was proposed: Genetics testing, neoadjuvant chemotherapy, bilateral mastectomies with axillary lymph  node dissection on the left and sentinel lymph node sampling on the  right, reconstruction, adjuvant radiation on the left, antiestrogens   REVIEW OF SYSTEMS: On the provided questionnaire, Gabriela Sullivan reports weight change, loss of sleep with fatigue that slightly affects her activities, palpable left breast lump with associated pain, wearing glasses, shortness of breath with walking, sleeping with a "couple" pillows, back pain, headaches, weakness, numbness, and anxiety. The patient denies unusual headaches, visual changes, nausea, vomiting, stiff neck, dizziness, or gait imbalance. There has been no cough, phlegm production, or pleurisy, no chest pain or pressure, and no change in bowel or bladder habits. The patient denies fever, rash, or bleeding. A detailed review of systems was otherwise entirely negative.   COVID 19 VACCINATION STATUS: refuses vaccination   PAST MEDICAL HISTORY: Past Medical History:  Diagnosis Date  . Asthma    remote  . Breast cancer ([place])   . Migraines   . PCOS (polycystic ovarian syndrome)     PAST SURGICAL HISTORY: History reviewed. No pertinent surgical history.  FAMILY HISTORY: Family History  Problem Relation Age of Onset  . Ovarian cancer Maternal Grandmother   . Prostate cancer Maternal Grandfather   . Breast cancer Cousin   . Stomach cancer Cousin    She has not information on her father. Her mother is 80 as of 10/2020. Alinna has one brother and one sister. She reports cancer in her maternal family-- breast in a cousin, ovarian in her grandmother, prostate in her grandfather, and stomach in a cousin.   GYNECOLOGIC HISTORY:  No LMP recorded. Menarche: 30 years old [place] P 0 (s/p miscarriage in 2013) LMP 1/26 - 07/03/2020, irregular, goes 2-6 months between periods Contraceptive never used HRT n/a  Hysterectomy? no BSO? 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 She is  single. She lives at home with her mother (who is a stay-at-home mom), their two dogs, and one cat. She is not a Designer, fashion/clothing.    ADVANCED DIRECTIVES: not in place; at the 10/04/2020 visit the patient was given the appropriate documents to complete and notarized at her discretion.   HEALTH MAINTENANCE: Social History   Tobacco Use  . Smoking status: Never Smoker  . Smokeless tobacco: Never Used  Substance Use Topics  . Alcohol use: Never  . Drug use: Never     Colonoscopy: n/a (age)  PAP: 2018  Bone density: n/a (age)   No Known Allergies  Current Outpatient Medications  Medication Sig Dispense Refill  . Vitamin D, Ergocalciferol, (DRISDOL) 50000 UNITS CAPS capsule Take 50,000 Units by mouth every 7 (seven) days.     No current facility-administered medications for this visit.    OBJECTIVE: White woman who appears stated age  17:   10/04/20 0850  BP: 133/63  Pulse: 65  Resp: 18  Temp: (!) 97 F (36.1 C)  SpO2: 100%     Body mass index is 39.18 kg/m.   Wt Readings from Last 3 Encounters:  10/04/20 200 lb 9.6 oz (91 kg)  08/03/14 175 lb (79.4 kg)      ECOG FS:1 - Symptomatic but completely ambulatory  Ocular: Sclerae unicteric, pupils round and equal Ear-Sullivan-throat: Wearing a mask Lymphatic: No cervical or supraclavicular adenopathy Lungs no rales or rhonchi Heart regular rate and rhythm Abd soft, nontender, positive bowel sounds MSK no focal spinal tenderness, no joint edema Neuro: non-focal, well-oriented, appropriate affect Breasts: The right breast is status post recent biopsy.  There is a minimal ecchymosis.  The left  breast is also status post a recent biopsy.  There is a movable mass, some skin edema with the port is visible but no frank peau d'orange and some erythema which the patient says was not present until the biopsies were obtained.  The left breast is imaged below.  Left breast 10/04/2020   LAB RESULTS:  CMP     Component Value  Date/Time   NA 142 10/04/2020 0755   K 3.8 10/04/2020 0755   CL 108 10/04/2020 0755   CO2 25 10/04/2020 0755   GLUCOSE 121 (H) 10/04/2020 0755   BUN 15 10/04/2020 0755   CREATININE 0.64 10/04/2020 0755   CALCIUM 8.7 (L) 10/04/2020 0755   PROT 7.3 10/04/2020 0755   ALBUMIN 4.1 10/04/2020 0755   AST 47 (H) 10/04/2020 0755   ALT 108 (H) 10/04/2020 0755   ALKPHOS 59 10/04/2020 0755   BILITOT 0.7 10/04/2020 0755   GFRNONAA >60 10/04/2020 0755    No results found for: TOTALPROTELP, ALBUMINELP, A1GS, A2GS, BETS, BETA2SER, GAMS, MSPIKE, SPEI  Lab Results  Component Value Date   WBC 6.0 10/04/2020   NEUTROABS 2.9 10/04/2020   HGB 13.9 10/04/2020   HCT 41.8 10/04/2020   MCV 84.1 10/04/2020   PLT 302 10/04/2020    No results found for: LABCA2  No components found for: EHOZYY482  No results for input(s): INR in the last 168 hours.  No results found for: LABCA2  No results found for: NOI370  No results found for: WUG891  No results found for: QXI503  No results found for: CA2729  No components found for: HGQUANT  No results found for: CEA1 / No results found for: CEA1   No results found for: AFPTUMOR  No results found for: CHROMOGRNA  No results found for: KPAFRELGTCHN, LAMBDASER, KAPLAMBRATIO (kappa/lambda light chains)  No results found for: HGBA, HGBA2QUANT, HGBFQUANT, HGBSQUAN (Hemoglobinopathy evaluation)   No results found for: LDH  No results found for: IRON, TIBC, IRONPCTSAT (Iron and TIBC)  No results found for: FERRITIN  Urinalysis No results found for: COLORURINE, APPEARANCEUR, LABSPEC, PHURINE, GLUCOSEU, HGBUR, BILIRUBINUR, KETONESUR, PROTEINUR, UROBILINOGEN, NITRITE, LEUKOCYTESUR   STUDIES: [place] AXILLARY NODE CORE BIOPSY LEFT  Addendum Date: 09/27/2020   ADDENDUM REPORT: 09/27/2020 12:02 ADDENDUM: Pathology revealed GRADE II INVASIVE DUCTAL CARCINOMA, DUCTAL CARCINOMA IN SITU of the LEFT breast, 3 o'clock. This was found to be concordant by Dr.  Lovey Newcomer. 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 This was found to be concordant by Dr. Lovey Newcomer. Pathology results were discussed with the patient by telephone. The patient reported doing well after the biopsies with tenderness at the sites. Post biopsy instructions and care were reviewed and questions were answered. The patient was encouraged to call [facility] for any additional concerns. My direct phone number was provided. The patient has a recent diagnosis of BILATERAL breast cancer and was referred to [facility] on Oct 04, 2020. Recommendation for a bilateral breast MRI to exclude any additional sites of disease given age and breast density. There also appears to be mild skin thickening on the LEFT mammogram, recommend to evaluate clinically for inflammatory cancer. Pathology results reported by Terie Purser, RN on 09/27/2020. Electronically Signed   By: Lovey Newcomer M.D.   On: 09/27/2020 12:02   Result Date: 09/27/2020 CLINICAL DATA:  Patient with suspicious left breast mass and cortically thickened left axillary lymph node. EXAM: ULTRASOUND  GUIDED LEFT BREAST CORE NEEDLE BIOPSY COMPARISON:  Previous exam(s). PROCEDURE: I met with the patient and we discussed the procedure of ultrasound-guided biopsy, including benefits and alternatives. We discussed the high likelihood of a successful procedure. We discussed the risks of the procedure, including infection, bleeding, tissue injury, clip migration, and inadequate sampling. Informed written consent was given. The usual time-out protocol was performed immediately prior to the procedure. Site 1: Left breast mass 3 o'clock position Lesion quadrant: Upper outer quadrant Using sterile technique and 1% Lidocaine as local anesthetic, under direct ultrasound visualization, a 14 gauge spring-loaded device was used to perform  biopsy of left breast mass 3 o'clock position using a lateral approach. At the conclusion of the procedure heart shaped tissue marker clip was deployed into the biopsy cavity. Follow up 2 view mammogram was performed and dictated separately. Site 2: Left axillary lymph node Lesion quadrant: Upper outer quadrant Using sterile technique and 1% Lidocaine as local anesthetic, under direct ultrasound visualization, a 14 gauge spring-loaded device was used to perform biopsy of left axillary lymph node using a lateral approach. At the conclusion of the procedure tribell tissue marker clip was deployed into the biopsy cavity. Follow up 2 view mammogram was performed and dictated separately. IMPRESSION: Ultrasound guided biopsy of left breast mass and left axillary lymph node. No apparent complications. Electronically Signed: By: Lovey Newcomer M.D. On: 09/26/2020 15:24   MM CLIP PLACEMENT LEFT  Result Date: 09/26/2020 CLINICAL DATA:  Patient status post ultrasound-guided core needle biopsy left breast mass 3 o'clock position and cortically thickened left axillary lymph node. EXAM: DIAGNOSTIC LEFT MAMMOGRAM POST ULTRASOUND BIOPSY COMPARISON:  Previous exam(s). FINDINGS: Mammographic images were obtained following ultrasound guided biopsy of left breast mass 3 o'clock position and cortically thickened left axillary lymph node. Site 1: Left breast mass 3 o'clock position: Heart shaped clip: In appropriate position. Site 2: Left axillary lymph node: Tribell clip: In appropriate position. Previously placed coil clip is redemonstrated. IMPRESSION: Appropriate positioning of the biopsy marking clips as above. Final Assessment: Post Procedure Mammograms for Marker Placement Electronically Signed   By: Lovey Newcomer M.D.   On: 09/26/2020 15:54   MM CLIP PLACEMENT LEFT  Result Date: 09/25/2020 CLINICAL DATA:  Evaluate biopsy markers EXAM: DIAGNOSTIC BILATERAL MAMMOGRAM POST STEREOTACTIC BIOPSY COMPARISON:  Previous exam(s).  FINDINGS: Mammographic images were obtained following stereotactic guided biopsy of bilateral breast calcifications. The biopsy marking clips are in expected position at the site of biopsy. IMPRESSION: The X shaped clip is in the approximate location of the biopsied right breast calcifications. The coil shaped clip is in the region of the biopsied left breast calcifications. Final Assessment: Post Procedure Mammograms for Marker Placement Electronically Signed   By: Dorise Bullion III M.D   On: 09/25/2020 10:04   MM CLIP PLACEMENT RIGHT  Result Date: 09/25/2020 CLINICAL DATA:  Evaluate biopsy markers EXAM: DIAGNOSTIC BILATERAL MAMMOGRAM POST STEREOTACTIC BIOPSY COMPARISON:  Previous exam(s). FINDINGS: Mammographic images were obtained following stereotactic guided biopsy of bilateral breast calcifications. The biopsy marking clips are in expected position at the site of biopsy. IMPRESSION: The X shaped clip is in the approximate location of the biopsied right breast calcifications. The coil shaped clip is in the region of the biopsied left breast calcifications. Final Assessment: Post Procedure Mammograms for Marker Placement Electronically Signed   By: Dorise Bullion III M.D   On: 09/25/2020 10:04   MM LT BREAST BX W LOC DEV 1ST LESION IMAGE BX [place] STEREO GUIDE  Addendum Date: 09/27/2020   ADDENDUM REPORT: 09/26/2020 15:01 ADDENDUM: Pathology revealed GRADE II INVASIVE DUCTAL CARCINOMA, DUCTAL CARCINOMA IN SITU WITH CALCIFICATIONS AND FOCAL NECROSIS of the LEFT breast, upper outer. This was found to be concordant by Dr. Dorise Bullion. 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 This was found to be concordant by Dr. Dorise Bullion. Pathology results were discussed with the patient by telephone. The patient reported doing well after the biopsies with tenderness at the sites. Post biopsy instructions and care were reviewed and questions were answered. The  patient was encouraged to call [facility] for any additional concerns. My direct phone number was provided. The patient was referred to [facility] on Oct 05, 2019. The patient is scheduled for a LEFT breast and LEFT axillary node biopsy at [facility] on September 26, 2020. Further recommendations will be guided by the results of these biopsies. Recommendation for a bilateral breast MRI to exclude any additional sites of disease given age and breast density. There also appears to be mild skin thickening on the LEFT mammogram, recommend to evaluate clinically for inflammatory cancer. Pathology results reported by Terie Purser, RN on 09/26/2020. Electronically Signed   By: Dorise Bullion III M.D   On: 09/26/2020 15:01   Result Date: 09/27/2020 CLINICAL DATA:  Biopsy of bilateral breast calcifications EXAM: BREAST STEREOTACTIC CORE NEEDLE BIOPSY COMPARISON:  Previous exams. FINDINGS: The patient and I discussed the procedure of stereotactic-guided biopsy including benefits and alternatives. We discussed the high likelihood of a successful procedure. We discussed the risks of the procedure including infection, bleeding, tissue injury, clip migration, and inadequate sampling. Informed written consent was given. The usual time out protocol was performed immediately prior to the procedure. Using sterile technique and 1% Lidocaine as local anesthetic, under stereotactic guidance, a 9 gauge vacuum assisted device was used to perform core needle biopsy of calcifications in the upper-outer left breast using a superior approach. Specimen radiograph was performed showing calcifications in several core specimens. Specimens with calcifications are identified for pathology. Lesion quadrant: Upper outer left breast At the conclusion of the procedure, a coil shaped tissue marker clip was deployed into the biopsy cavity.  Follow-up 2-view mammogram was performed and dictated separately. Using sterile technique and 1% Lidocaine as local anesthetic, under stereotactic guidance, a 9 gauge vacuum assisted device was used to perform core needle biopsy of calcifications in the upper outer right breast using a superior approach. Specimen radiograph was performed showing calcifications in several core specimens. Specimens with calcifications are identified for pathology. Lesion quadrant: Upper outer right breast At the conclusion of the procedure, an X shaped tissue marker clip was deployed into the biopsy cavity. Follow-up 2-view mammogram was performed and dictated separately. IMPRESSION: Stereotactic-guided biopsy of calcifications in the upper-outer left breast and the upper outer right breast. No apparent complications. Electronically Signed: By: Dorise Bullion III M.D On: 09/25/2020 09:42   [place] LT BREAST BX W LOC DEV 1ST LESION IMG BX SPEC US GUIDE  Addendum Date: 09/27/2020   ADDENDUM REPORT: 09/27/2020 12:02 ADDENDUM: Pathology revealed GRADE II INVASIVE DUCTAL CARCINOMA, DUCTAL CARCINOMA IN SITU of the LEFT breast, 3 o'clock. This was found to be concordant by Dr. Lovey Newcomer. 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 This was found to be concordant by Dr. Lovey Newcomer. Pathology results were discussed with the patient  by telephone. The patient reported doing well after the biopsies with tenderness at the sites. Post biopsy instructions and care were reviewed and questions were answered. The patient was encouraged to call [facility] for any additional concerns. My direct phone number was provided. The patient has a recent diagnosis of BILATERAL breast cancer and was referred to [facility] on Oct 04, 2020. Recommendation for a bilateral breast MRI to exclude any additional sites of disease given age  and breast density. There also appears to be mild skin thickening on the LEFT mammogram, recommend to evaluate clinically for inflammatory cancer. Pathology results reported by Terie Purser, RN on 09/27/2020. Electronically Signed   By: Lovey Newcomer M.D.   On: 09/27/2020 12:02   Result Date: 09/27/2020 CLINICAL DATA:  Patient with suspicious left breast mass and cortically thickened left axillary lymph node. EXAM: ULTRASOUND GUIDED LEFT BREAST CORE NEEDLE BIOPSY COMPARISON:  Previous exam(s). PROCEDURE: I met with the patient and we discussed the procedure of ultrasound-guided biopsy, including benefits and alternatives. We discussed the high likelihood of a successful procedure. We discussed the risks of the procedure, including infection, bleeding, tissue injury, clip migration, and inadequate sampling. Informed written consent was given. The usual time-out protocol was performed immediately prior to the procedure. Site 1: Left breast mass 3 o'clock position Lesion quadrant: Upper outer quadrant Using sterile technique and 1% Lidocaine as local anesthetic, under direct ultrasound visualization, a 14 gauge spring-loaded device was used to perform biopsy of left breast mass 3 o'clock position using a lateral approach. At the conclusion of the procedure heart shaped tissue marker clip was deployed into the biopsy cavity. Follow up 2 view mammogram was performed and dictated separately. Site 2: Left axillary lymph node Lesion quadrant: Upper outer quadrant Using sterile technique and 1% Lidocaine as local anesthetic, under direct ultrasound visualization, a 14 gauge spring-loaded device was used to perform biopsy of left axillary lymph node using a lateral approach. At the conclusion of the procedure tribell tissue marker clip was deployed into the biopsy cavity. Follow up 2 view mammogram was performed and dictated separately. IMPRESSION: Ultrasound guided biopsy of left breast mass and left axillary lymph node. No  apparent complications. Electronically Signed: By: Lovey Newcomer M.D. On: 09/26/2020 15:24   MM RT BREAST BX W LOC DEV 1ST LESION IMAGE BX SPEC STEREO GUIDE  Addendum Date: 09/27/2020   ADDENDUM REPORT: 09/26/2020 15:01 ADDENDUM: Pathology revealed GRADE II INVASIVE DUCTAL CARCINOMA, DUCTAL CARCINOMA IN SITU WITH CALCIFICATIONS AND FOCAL NECROSIS of the LEFT breast, upper outer. This was found to be concordant by Dr. Dorise Bullion. 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 This was found to be concordant by Dr. Dorise Bullion. Pathology results were discussed with the patient by telephone. The patient reported doing well after the biopsies with tenderness at the sites. Post biopsy instructions and care were reviewed and questions were answered. The patient was encouraged to call [facility] for any additional concerns. My direct phone number was provided. The patient was referred to [facility] on Oct 05, 2019. The patient is scheduled for a LEFT breast and LEFT axillary node biopsy at [facility] on September 26, 2020. Further recommendations will be guided by the results of these biopsies. Recommendation for a bilateral breast MRI to  exclude any additional sites of disease given age and breast density. There also appears to be mild skin thickening on the LEFT mammogram, recommend to evaluate clinically for inflammatory cancer. Pathology results reported by Terie Purser, RN on 09/26/2020. Electronically Signed   By: Dorise Bullion III M.D   On: 09/26/2020 15:01   Result Date: 09/27/2020 CLINICAL DATA:  Biopsy of bilateral breast calcifications EXAM: BREAST STEREOTACTIC CORE NEEDLE BIOPSY COMPARISON:  Previous exams. FINDINGS: The patient and I discussed the procedure of stereotactic-guided biopsy including benefits and alternatives. We discussed the high  likelihood of a successful procedure. We discussed the risks of the procedure including infection, bleeding, tissue injury, clip migration, and inadequate sampling. Informed written consent was given. The usual time out protocol was performed immediately prior to the procedure. Using sterile technique and 1% Lidocaine as local anesthetic, under stereotactic guidance, a 9 gauge vacuum assisted device was used to perform core needle biopsy of calcifications in the upper-outer left breast using a superior approach. Specimen radiograph was performed showing calcifications in several core specimens. Specimens with calcifications are identified for pathology. Lesion quadrant: Upper outer left breast At the conclusion of the procedure, a coil shaped tissue marker clip was deployed into the biopsy cavity. Follow-up 2-view mammogram was performed and dictated separately. Using sterile technique and 1% Lidocaine as local anesthetic, under stereotactic guidance, a 9 gauge vacuum assisted device was used to perform core needle biopsy of calcifications in the upper outer right breast using a superior approach. Specimen radiograph was performed showing calcifications in several core specimens. Specimens with calcifications are identified for pathology. Lesion quadrant: Upper outer right breast At the conclusion of the procedure, an X shaped tissue marker clip was deployed into the biopsy cavity. Follow-up 2-view mammogram was performed and dictated separately. IMPRESSION: Stereotactic-guided biopsy of calcifications in the upper-outer left breast and the upper outer right breast. No apparent complications. Electronically Signed: By: Dorise Bullion III M.D On: 09/25/2020 09:42     ELIGIBLE FOR AVAILABLE RESEARCH PROTOCOL:   ASSESSMENT: 30 y.o. 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 Specifically we discussed the biology of her breast cancer, its diagnosis, staging, treatment  options and prognosis. We first reviewed the fact that cancer is not one disease but more than 100 different diseases and that it is important to keep them separate-- otherwise when friends and relatives discuss their own cancer experiences with Gabriela Sullivan confusion can result. Similarly we explained that if breast cancer spreads to the bone or liver, the patient would not have bone cancer or liver cancer, but breast cancer in the bone and breast cancer in the liver: one cancer in three places-- not 3 different cancers which otherwise would have to be treated in 3 different ways.  We discussed the difference between local and systemic therapy. In  terms of loco-regional treatment, lumpectomy plus radiation is equivalent to mastectomy as far as survival is concerned.  However, given the very extensive disease in the left side, which will require mastectomy, and the fact that the patient has bilateral breast cancers, she is opting for bilateral mastectomies and I think that is the best decision in her situation  We also noted that in terms of sequencing of treatments, whether systemic therapy or surgery is done first does not affect the ultimate outcome.  This is relevant to Gabriela Sullivan's case since we believe neoadjuvant treatment  will shrink the larger tumor at least somewhat and make the final surgery easier, with less likely positive margins.  We will also obtain some prognostic information from the tumor response.  We then discussed the rationale for systemic therapy. There is some risk that this cancer may have already spread to other parts of her body.  We are going to obtain a CT of the chest and bone scan but Gabriela Sullivan is aware that we cannot image microscopic disease.  If these scans are negative as I hope there is still a significant risk of her having microscopic disease outside the breast and armpit.  Next we went over the options for systemic therapy which are anti-estrogens, anti-HER-2 immunotherapy, and chemotherapy. Gabriela Sullivan does not meet criteria for anti-HER-2 immunotherapy. She is a good candidate for anti-estrogens.  Given her stage and particularly the left nodal situation she does not qualify for MammaPrint or Oncotype and we will proceed with chemotherapy as discussed.  Specifically that will consist of cyclophosphamide and doxorubicin in dose dense fashion x4 followed by weekly paclitaxel x12.  One of the possible permanent side effects this is loss of fertility.  Even though Gabriela Sullivan has polycystic ovaries and has had difficulty with pregnancies she would like to maintain fertility so we will start goserelin as soon as possible.  She understands she will have one period after the first dose and then no further periods after the second dose.  Gabriela Sullivan has a good understanding of the overall plan. She agrees with it. She knows the goal of treatment in her case is cure. She will call with any problems that may develop before her next visit here.  Total encounter time 65 minutes.Gabriela Jews C. Annessa Satre, MD 10/04/2020 12:04 PM Medical Oncology and Hematology [facility] [address], [place] [address] Tel. [phone]    Fax. [phone]   This document serves as a record of services  personally performed by Lurline Del, MD. It was created on his behalf by Wilburn Mylar, a trained medical scribe. The creation of this record is based on the scribe's personal observations and the provider's statements to them.   I, Lurline Del MD, have reviewed the above documentation for accuracy and completeness, and I agree with the above.    *Total Encounter Time as defined by the Centers for Medicare and Medicaid Services includes, in addition to the face-to-face time of a patient visit (documented in the note above) non-face-to-face time: obtaining and reviewing outside history, ordering and reviewing medications, tests or procedures, care coordination (communications with other health care professionals or caregivers) and documentation in the medical record.

## 2020-10-04 ENCOUNTER — Inpatient Hospital Stay (HOSPITAL_BASED_OUTPATIENT_CLINIC_OR_DEPARTMENT_OTHER): Payer: Medicaid Other | Admitting: Genetic Counselor

## 2020-10-04 ENCOUNTER — Ambulatory Visit: Payer: Medicaid Other | Attending: Surgery | Admitting: Physical Therapy

## 2020-10-04 ENCOUNTER — Other Ambulatory Visit: Payer: Self-pay | Admitting: *Deleted

## 2020-10-04 ENCOUNTER — Inpatient Hospital Stay: Payer: Medicaid Other

## 2020-10-04 ENCOUNTER — Encounter: Payer: Self-pay | Admitting: Genetic Counselor

## 2020-10-04 ENCOUNTER — Ambulatory Visit: Payer: Self-pay | Admitting: Surgery

## 2020-10-04 ENCOUNTER — Encounter: Payer: Self-pay | Admitting: Physical Therapy

## 2020-10-04 ENCOUNTER — Encounter: Payer: Self-pay | Admitting: Licensed Clinical Social Worker

## 2020-10-04 ENCOUNTER — Other Ambulatory Visit: Payer: Self-pay

## 2020-10-04 ENCOUNTER — Inpatient Hospital Stay: Payer: Medicaid Other | Attending: Oncology | Admitting: Oncology

## 2020-10-04 ENCOUNTER — Encounter: Payer: Self-pay | Admitting: Oncology

## 2020-10-04 ENCOUNTER — Ambulatory Visit
Admission: RE | Admit: 2020-10-04 | Discharge: 2020-10-04 | Disposition: A | Discharge status: Home / Self Care | Payer: No Typology Code available for payment source | Source: Ambulatory Visit | Attending: Radiation Oncology | Admitting: Radiation Oncology

## 2020-10-04 VITALS — BP 133/63 | HR 65 | Temp 97.0°F | Resp 18 | Ht 60.0 in | Wt 200.6 lb

## 2020-10-04 DIAGNOSIS — D0511 Intraductal carcinoma in situ of right breast: Secondary | ICD-10-CM

## 2020-10-04 DIAGNOSIS — Z803 Family history of malignant neoplasm of breast: Secondary | ICD-10-CM

## 2020-10-04 DIAGNOSIS — Z8042 Family history of malignant neoplasm of prostate: Secondary | ICD-10-CM | POA: Diagnosis not present

## 2020-10-04 DIAGNOSIS — Z5189 Encounter for other specified aftercare: Secondary | ICD-10-CM | POA: Insufficient documentation

## 2020-10-04 DIAGNOSIS — R634 Abnormal weight loss: Secondary | ICD-10-CM | POA: Insufficient documentation

## 2020-10-04 DIAGNOSIS — Z8041 Family history of malignant neoplasm of ovary: Secondary | ICD-10-CM | POA: Insufficient documentation

## 2020-10-04 DIAGNOSIS — G893 Neoplasm related pain (acute) (chronic): Secondary | ICD-10-CM | POA: Insufficient documentation

## 2020-10-04 DIAGNOSIS — C773 Secondary and unspecified malignant neoplasm of axilla and upper limb lymph nodes: Secondary | ICD-10-CM | POA: Insufficient documentation

## 2020-10-04 DIAGNOSIS — C50412 Malignant neoplasm of upper-outer quadrant of left female breast: Secondary | ICD-10-CM | POA: Diagnosis present

## 2020-10-04 DIAGNOSIS — Z79818 Long term (current) use of other agents affecting estrogen receptors and estrogen levels: Secondary | ICD-10-CM | POA: Insufficient documentation

## 2020-10-04 DIAGNOSIS — Z170 Estrogen receptor positive status [ER+]: Secondary | ICD-10-CM | POA: Insufficient documentation

## 2020-10-04 DIAGNOSIS — R293 Abnormal posture: Secondary | ICD-10-CM | POA: Diagnosis present

## 2020-10-04 DIAGNOSIS — Z800 Family history of malignant neoplasm of digestive organs: Secondary | ICD-10-CM | POA: Diagnosis not present

## 2020-10-04 DIAGNOSIS — Z79899 Other long term (current) drug therapy: Secondary | ICD-10-CM | POA: Diagnosis not present

## 2020-10-04 DIAGNOSIS — R5383 Other fatigue: Secondary | ICD-10-CM | POA: Insufficient documentation

## 2020-10-04 DIAGNOSIS — E282 Polycystic ovarian syndrome: Secondary | ICD-10-CM | POA: Insufficient documentation

## 2020-10-04 DIAGNOSIS — Z1379 Encounter for other screening for genetic and chromosomal anomalies: Secondary | ICD-10-CM

## 2020-10-04 LAB — CBC WITH DIFFERENTIAL (CANCER CENTER ONLY)
Abs Immature Granulocytes: 0.01 10*3/uL (ref 0.00–0.07)
Basophils Absolute: 0 10*3/uL (ref 0.0–0.1)
Basophils Relative: 1 %
Eosinophils Absolute: 0.2 10*3/uL (ref 0.0–0.5)
Eosinophils Relative: 3 %
HCT: 41.8 % (ref 36.0–46.0)
Hemoglobin: 13.9 g/dL (ref 12.0–15.0)
Immature Granulocytes: 0 %
Lymphocytes Relative: 43 %
Lymphs Abs: 2.6 10*3/uL (ref 0.7–4.0)
MCH: 28 pg (ref 26.0–34.0)
MCHC: 33.3 g/dL (ref 30.0–36.0)
MCV: 84.1 fL (ref 80.0–100.0)
Monocytes Absolute: 0.3 10*3/uL (ref 0.1–1.0)
Monocytes Relative: 6 %
Neutro Abs: 2.9 10*3/uL (ref 1.7–7.7)
Neutrophils Relative %: 47 %
Platelet Count: 302 10*3/uL (ref 150–400)
RBC: 4.97 MIL/uL (ref 3.87–5.11)
RDW: 12.7 % (ref 11.5–15.5)
WBC Count: 6 10*3/uL (ref 4.0–10.5)
nRBC: 0 % (ref 0.0–0.2)

## 2020-10-04 LAB — CMP (CANCER CENTER ONLY)
ALT: 108 U/L — ABNORMAL HIGH (ref 0–44)
AST: 47 U/L — ABNORMAL HIGH (ref 15–41)
Albumin: 4.1 g/dL (ref 3.5–5.0)
Alkaline Phosphatase: 59 U/L (ref 38–126)
Anion gap: 9 (ref 5–15)
BUN: 15 mg/dL (ref 6–20)
CO2: 25 mmol/L (ref 22–32)
Calcium: 8.7 mg/dL — ABNORMAL LOW (ref 8.9–10.3)
Chloride: 108 mmol/L (ref 98–111)
Creatinine: 0.64 mg/dL (ref 0.44–1.00)
GFR, Estimated: >60 mL/min (ref >60)
Glucose, Bld: 121 mg/dL — ABNORMAL HIGH (ref 70–99)
Potassium: 3.8 mmol/L (ref 3.5–5.1)
Sodium: 142 mmol/L (ref 135–145)
Total Bilirubin: 0.7 mg/dL (ref 0.3–1.2)
Total Protein: 7.3 g/dL (ref 6.5–8.1)

## 2020-10-04 LAB — GENETIC SCREENING ORDER

## 2020-10-04 MED ORDER — PROCHLORPERAZINE MALEATE 10 MG PO TABS: 10.0000 mg | ORAL_TABLET | Freq: Four times a day (QID) | ORAL | 1 refills | Status: DC | PRN

## 2020-10-04 MED ORDER — DEXAMETHASONE 4 MG PO TABS: 8.0000 mg | ORAL_TABLET | Freq: Every day | ORAL | 1 refills | Status: DC

## 2020-10-04 NOTE — Therapy (Signed)
[place], [place], [address] Phone: [phone]   Fax:  [phone]  Physical Therapy Evaluation  Patient Details  Name: Gabriela Sullivan MRN: [id number] Date of Birth: 07-12-1990 Referring Provider (PT): Dr. Erroll Luna   Encounter Date: 10/04/2020   PT End of Session - 10/04/20 1117    Visit Number 1    Number of Visits 2    Date for PT Re-Evaluation 04/06/21    PT Start Time 1030    PT Stop Time 7342   Also saw her from [phone] for a total of 25 min   PT Time Calculation (min) 20 min    Activity Tolerance Patient tolerated treatment well    Behavior During Therapy [facility] for tasks assessed/performed           Past Medical History:  Diagnosis Date  . Asthma    remote  . Breast cancer ([place])   . Migraines   . PCOS (polycystic ovarian syndrome)     History reviewed. No pertinent surgical history.  There were no vitals filed for this visit.    Subjective Assessment - 10/04/20 1100    Subjective Patient reports she is here today to be seen by her medical team for her newly diagnosed bilateral breast cancer.    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 The right side measures 8 mm and is low grade DCIS. The left side mass measures 5.1 cm and is located in the upper outer quadrant. It is ER/PR positive and HER2 negative with a Ki67 of 15%. She has 4 abnormal appearing axillary lymph nodes wit 1 biopsied and positive for metastatic disease.    Patient Stated Goals Reduce lymphedema risk and learn post op shoulder ROM HEP    Currently in Pain? No/denies              [facility] PT Assessment - 10/04/20 0001      Assessment   Medical Diagnosis Bil breast CA    Referring Provider (PT) Dr. Marcello Moores [place]    Onset Date/Surgical Date 09/26/20    Hand Dominance Right    Prior Therapy none      Precautions   Precautions  Other (comment)    Precaution Comments active cancer      Restrictions   Weight Bearing Restrictions No      Balance Screen   Has the patient fallen in the past 6 months No    Has the patient had a decrease in activity level because of a fear of falling?  No    Is the patient reluctant to leave their home because of a fear of falling?  No      Home Environment   Living Environment Private residence    Living Arrangements Parent    Available Help at Discharge Family      Prior Function   Level of Independence Independent    Vocation Full time employment    Community education officer at [employer] She does not exercise      Cognition   Overall Cognitive Status Within Functional Limits for tasks assessed      Posture/Postural Control   Posture/Postural Control Postural limitations    Postural Limitations Rounded Shoulders;Forward head      ROM / Strength   AROM / PROM / Strength AROM;Strength      AROM   Overall AROM Comments Cervical  AROM is WNL    AROM Assessment Site Shoulder    Right/Left Shoulder Right;Left    Right Shoulder Extension 38 Degrees    Right Shoulder Flexion 38 Degrees    Right Shoulder ABduction 153 Degrees    Right Shoulder Internal Rotation 157 Degrees    Right Shoulder External Rotation 76 Degrees    Right Shoulder Horizontal ABduction 82 Degrees    Left Shoulder Flexion 43 Degrees    Left Shoulder ABduction 146 Degrees    Left Shoulder Internal Rotation 152 Degrees    Left Shoulder External Rotation 70 Degrees    Left Shoulder Horizontal ABduction 76 Degrees      Strength   Overall Strength Within functional limits for tasks performed             LYMPHEDEMA/ONCOLOGY QUESTIONNAIRE - 10/04/20 0001      Type   Cancer Type Bil breast cancer      Lymphedema Assessments   Lymphedema Assessments Upper extremities      Right Upper Extremity Lymphedema   10 cm Proximal to Olecranon Process 34.7 cm    Olecranon Process  26.9 cm    10 cm Proximal to Ulnar Styloid Process 25.9 cm    Just Proximal to Ulnar Styloid Process 17.3 cm    Across Hand at [employer] 20.3 cm    At [place] of 2nd Digit 7.1 cm      Left Upper Extremity Lymphedema   10 cm Proximal to Olecranon Process 32.8 cm    Olecranon Process 26.6 cm    10 cm Proximal to Ulnar Styloid Process 25.2 cm    Just Proximal to Ulnar Styloid Process 17.7 cm    Across Hand at [employer] 20 cm    At [place] of 2nd Digit 7 cm           L-DEX FLOWSHEETS - 10/04/20 1100      L-DEX LYMPHEDEMA SCREENING   Measurement Type Bilateral    L-DEX MEASUREMENT EXTREMITY Upper Extremity    POSITION  Standing    DOMINANT SIDE Right    BASELINE RIGHT 0.6    BASELINE LEFT 0.1           The patient was assessed using the L-Dex machine today to produce a lymphedema index baseline score. The patient will be reassessed on a regular basis (typically every 3 months) to obtain new L-Dex scores. If the score is > 6.5 points away from his/her baseline score indicating onset of subclinical lymphedema, it will be recommended to wear a compression garment for 4 weeks, 12 hours per day and then be reassessed. If the score continues to be > 6.5 points from baseline at reassessment, we will initiate lymphedema treatment. Assessing in this manner has a 95% rate of preventing clinically significant lymphedema.      Katina Dung - 10/04/20 0001    Open a tight or new jar No difficulty    Do heavy household chores (wash walls, wash floors) No difficulty    Carry a shopping bag or briefcase No difficulty    Wash your back Mild difficulty    Use a knife to cut food No difficulty    Recreational activities in which you take some force or impact through your arm, shoulder, or hand (golf, hammering, tennis) Mild difficulty    During the past week, to what extent has your arm, shoulder or hand problem interfered with your normal social activities with family, friends, neighbors, or  groups?  Slightly    During the past week, to what extent has your arm, shoulder or hand problem limited your work or other regular daily activities Not at all    Arm, shoulder, or hand pain. Mild    Tingling (pins and needles) in your arm, shoulder, or hand Mild    Difficulty Sleeping No difficulty    DASH Score 11.36 %            Objective measurements completed on examination: See above findings.        Patient was instructed today in a home exercise program today for post op shoulder range of motion. These included active assist shoulder flexion in sitting, scapular retraction, wall walking with shoulder abduction, and hands behind head external rotation.  She was encouraged to do these twice a day, holding 3 seconds and repeating 5 times when permitted by her physician.           PT Education - 10/04/20 1116    Education Details Lymphedema risk reduction and post op shoulder ROM HEP    Person(s) Educated Patient    Methods Explanation;Demonstration;Handout    Comprehension Returned demonstration;Verbalized understanding               PT Long Term Goals - 10/04/20 1126      PT LONG TERM GOAL #1   Title Patient will demonstrate she has regained full shoulder ROM and function post operatively compared to baselines.    Time 6    Period Months    Status New    Target Date 04/06/21           Breast Clinic Goals - 10/04/20 1126      Patient will be able to verbalize understanding of pertinent lymphedema risk reduction practices relevant to her diagnosis specifically related to skin care.   Time 1    Period Days    Status Achieved      Patient will be able to return demonstrate and/or verbalize understanding of the post-op home exercise program related to regaining shoulder range of motion.   Time 1    Period Days    Status Achieved      Patient will be able to verbalize understanding of the importance of attending the postoperative After Breast Cancer  Class for further lymphedema risk reduction education and therapeutic exercise.   Time 1    Period Days    Status Achieved                 Plan - 10/04/20 1119    Clinical Impression Statement Patient was diagnosed on 09/26/2020 with 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 The right side measures 8 mm and is low grade DCIS. The left side mass measures 5.1 cm and is located in the upper outer quadrant. It is ER/PR positive and HER2 negative with a Ki67 of 15%. She has 4 abnormal appearing axillary lymph nodes wit 1 biopsied and positive for metastatic disease. Her multidisciplinary medical team met prior to her assessments to determine a recommended treatment plan. She is planning to have neoadjuvant chemotherapy followed by possibly a bilateral mastectomy, left axillary lymph node dissection and right sentinel node biopsy and then left breast and axillary radiation, followed by anti-estrogen therapy. She will benefit from post op PT to regain shoulder ROM and from L-Dex screens every 3 months for 2 years to detect subclinical lymphedema.    Stability/Clinical Decision Making Stable/Uncomplicated    Clinical Decision Making  Low    PT Frequency --   Eval and 1 f/u visit   PT Treatment/Interventions ADLs/Self Care Home Management;Therapeutic exercise;Patient/family education    PT Next Visit Plan Will reassess 3-4 weeks post op    PT Home Exercise Plan Post op shoulder ROM HEP    Consulted and Agree with Plan of Care Patient           Patient will benefit from skilled therapeutic intervention in order to improve the following deficits and impairments:  Postural dysfunction,Decreased range of motion,Decreased knowledge of precautions,Impaired UE functional use,Pain  Visit Diagnosis: Ductal carcinoma in situ (DCIS) of right breast - Plan: PT plan of care cert/re-cert  Malignant neoplasm of upper-outer quadrant of left breast in female, estrogen receptor  positive ([place]) - Plan: PT plan of care cert/re-cert  Abnormal posture - Plan: PT plan of care cert/re-cert   Patient will follow up at outpatient cancer rehab 3-4 weeks following surgery.  If the patient requires physical therapy at that time, a specific plan will be dictated and sent to the referring physician for approval. The patient was educated today on appropriate basic range of motion exercises to begin post operatively and the importance of attending the After Breast Cancer class following surgery.  Patient was educated today on lymphedema risk reduction practices as it pertains to recommendations that will benefit the patient immediately following surgery.  She verbalized good understanding.      Problem List Patient Active Problem List   Diagnosis Date Noted  . 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  . Malignant neoplasm of upper-outer quadrant of left breast in female, estrogen receptor positive ([place]) 09/29/2020  . Elevated LFTs    Annia Friendly, [place] 10/04/20 11:28 AM  [place], [place], [address] Phone: [phone]   Fax:  [phone]  Name: Ellisha Bankson MRN: [id number] Date of Birth: August 17, 1990

## 2020-10-04 NOTE — H&P (View-Only) (Signed)
Gabriela Sullivan Appointment: 10/04/2020 9:00 AM Location: [facility] Patient #: [id number] DOB: 1990-09-04 Undefined / Language: [name] / Race: Refused to Report/Unreported Female  History of Present Illness Marcello Moores A. Aigner Horseman MD; 10/04/2020 11:35 AM) Patient words: Patient presents the multidisciplinary clinic due to her history of left breast mass. As noted many months ago. She underwent workup in [place] which consisted of bilateral mammogram and ultrasound. 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 Family history: Grandmother with ovarian cancer and grandfather with prostate cancer as well as paternal cousins with breast cancer. She is sore for biopsy but has no other complaints. She's had no nipple discharge. She also was found to have disease in the left axillary lymph node basin by core biopsies well consistent with metastatic breast cancer and multiple large nodes in the left by ultrasound.                   CLINICAL DATA: Patient status post ultrasound-guided core needle biopsy left breast mass 3 o'clock position and cortically thickened left axillary lymph node.  EXAM: DIAGNOSTIC LEFT MAMMOGRAM POST ULTRASOUND BIOPSY  COMPARISON: Previous exam(s).  FINDINGS: Mammographic images were obtained following ultrasound guided biopsy of left breast mass 3 o'clock position and cortically thickened left axillary lymph node.  Site 1: Left breast mass 3 o'clock position: Heart shaped clip: In appropriate position.  Site 2: Left axillary lymph node: Tribell clip: In appropriate position.  Previously placed coil clip is redemonstrated.  IMPRESSION: Appropriate positioning of the biopsy marking clips as above.  Final Assessment: Post Procedure Mammograms for Marker Placement   Electronically Signed By:  Lovey Newcomer M.D. On: 09/26/2020 15:54   Evaluate biopsy markers  EXAM: DIAGNOSTIC BILATERAL MAMMOGRAM POST STEREOTACTIC BIOPSY  COMPARISON: Previous exam(s).  FINDINGS: Mammographic images were obtained following stereotactic guided biopsy of bilateral breast calcifications. The biopsy marking clips are in expected position at the site of biopsy.  IMPRESSION: The X shaped clip is in the approximate location of the biopsied right breast calcifications. The coil shaped clip is in the region of the biopsied left breast calcifications.  Final Assessment: Post Procedure Mammograms for Marker Placement   Electronically Signed By: Dorise Bullion III M.D On: 09/25/2020 10:04     ADDITIONAL INFORMATION: 1. FLUORESCENCE IN-SITU HYBRIDIZATION Results: GROUP 5: HER2 **NEGATIVE** Equivocal form of amplification of the HER2 gene was detected in the IHC 2+ tissue sample received from this individual. HER2 FISH was performed by a technologist and cell imaging and analysis on the [employer]. RATIO OF HER2/CEN17 SIGNALS 1.66 AVERAGE HER2 COPY NUMBER PER CELL 2.65 The ratio of HER2/CEN 17 is within the range < 2.0 of HER2/CEN 17 and a copy number of HER2 signals per cell is <4.0. Arch Pathol Lab Med 1:1,2018 Thressa Sheller MD Pathologist, Electronic Signature ( Signed 10/02/2020) 1. PROGNOSTIC INDICATORS Results: IMMUNOHISTOCHEMICAL AND MORPHOMETRIC ANALYSIS PERFORMED MANUALLY The tumor cells are EQUIVOCAL for Her2 (2+). Her2 by FISH will be performed and results reported separately. Estrogen Receptor: 60%, POSITIVE, MODERATE STAINING INTENSITY Progesterone Receptor: 10%, POSITIVE, STRONG STAINING INTENSITY Proliferation Marker Ki67: 15% REFERENCE RANGE ESTROGEN RECEPTOR NEGATIVE 0% POSITIVE =>1% REFERENCE RANGE PROGESTERONE RECEPTOR 1 of 3 FINAL for Pluta, Chase ([id number]) ADDITIONAL INFORMATION:(continued) NEGATIVE 0% POSITIVE =>1% All controls stained appropriately Thressa Sheller MD Pathologist, Electronic Signature ( Signed 09/28/2020) FINAL DIAGNOSIS Diagnosis 1.  Breast, left, needle core biopsy, 3 o'clock - INVASIVE DUCTAL CARCINOMA. DUCTAL CARCINOMA IN SITU. 2. Lymph node, needle/core biopsy, left axilla - METASTATIC CARCINOMA. Microscopic Comment 1. The carcinoma appears grade 2. The greatest linear extent of tumor in any one core is approximately 10 mm. Ancillary studies will be reported separately. Results reported to [facility] on 09/27/2020. Dr. Melina Copa reviewed the case. Anastasia  Diagnosis 1. Breast, left, needle core biopsy, upper outer - INVASIVE DUCTAL CARCINOMA, GRADE 2. - DUCTAL CARCINOMA IN SITU WITH CALCIFICATIONS AND FOCAL NECROSIS. - SEE MICROSCOPIC DESCRIPTION. 2. Breast, right, needle core biopsy, upper outer - LOW GRADE DUC TAL CARCINOMA IN SITU. - SEE MICROSCOPIC DESCRIPTION.  The patient is a 30 year old female.   Past Surgical History Conni Slipper, RN; 10/04/2020 8:03 AM) No pertinent past surgical history  Diagnostic Studies History Conni Slipper, RN; 10/04/2020 8:03 AM) Colonoscopy never Mammogram within last year Pap Smear 1-5 years ago  Medication History Conni Slipper, RN; 10/04/2020 8:02 AM) Medications Reconciled  Social History Conni Slipper, RN; 10/04/2020 8:03 AM) Caffeine use Carbonated beverages. No alcohol use No drug use Tobacco use Never smoker.  Family History Conni Slipper, RN; 10/04/2020 8:03 AM) Breast Cancer Family Members In General. Cancer Family Members In General. Diabetes Mellitus Mother. Hypertension Mother. Ovarian Cancer Family Members In General. Prostate Cancer Family Members In General.  Pregnancy / Birth History Conni Slipper, RN; 10/04/2020 8:03 AM) Age at menarche 61 years. Gravida 1 Irregular periods  Other Problems Conni Slipper, RN; 10/04/2020 8:03 AM) Asthma     Review of Systems Conni Slipper RN; 10/04/2020 8:03 AM) General Not Present-  Appetite Loss, Chills, Fatigue, Fever, Night Sweats, Weight Gain and Weight Loss. Skin Not Present- Change in Wart/Mole, Dryness, Hives, Jaundice, New Lesions, Non-Healing Wounds, Rash and Ulcer. HEENT Present- Wears glasses/contact lenses. Not Present- Earache, Hearing Loss, Hoarseness, Nose Bleed, Oral Ulcers, Ringing in the Ears, Seasonal Allergies, Sinus Pain, Sore Throat, Visual Disturbances and Yellow Eyes. Respiratory Not Present- Bloody sputum, Chronic Cough, Difficulty Breathing, Snoring and Wheezing. Breast Not Present- Breast Mass, Breast Pain, Nipple Discharge and Skin Changes. Cardiovascular Not Present- Chest Pain, Difficulty Breathing Lying Down, Leg Cramps, Palpitations, Rapid Heart Rate, Shortness of Breath and Swelling of Extremities. Gastrointestinal Not Present- Abdominal Pain, Bloating, Bloody Stool, Change in Bowel Habits, Chronic diarrhea, Constipation, Difficulty Swallowing, Excessive gas, Gets full quickly at meals, Hemorrhoids, Indigestion, Nausea, Rectal Pain and Vomiting. Female Genitourinary Not Present- Frequency, Nocturia, Painful Urination, Pelvic Pain and Urgency. Musculoskeletal Present- Back Pain. Not Present- Joint Pain, Joint Stiffness, Muscle Pain, Muscle Weakness and Swelling of Extremities. Neurological Present- Numbness. Not Present- Decreased Memory, Fainting, Headaches, Seizures, Tingling, Tremor, Trouble walking and Weakness. Psychiatric Not Present- Anxiety, Bipolar, Change in Sleep Pattern, Depression, Fearful and Frequent crying. Endocrine Not Present- Cold Intolerance, Excessive Hunger, Hair Changes, Heat Intolerance, Hot flashes and New Diabetes. Hematology Not Present- Blood Thinners, Easy Bruising, Excessive bleeding, Gland problems, HIV and Persistent Infections.   Physical Exam (Kalise Fickett A. Celeste Tavenner MD; 10/04/2020 11:37 AM)  General Mental Status-Alert. General Appearance-Consistent with stated age. Hydration-Well  hydrated. Voice-Normal.  Head and Neck Head-normocephalic, atraumatic with no lesions or palpable masses. Trachea-midline. Thyroid Gland Characteristics - normal size and consistency.  Chest and Lung Exam Chest and lung exam reveals -quiet, even and easy respiratory effort with no use of accessory muscles and on auscultation, normal breath sounds, no adventitious sounds and normal vocal resonance. Inspection Chest Wall - Normal. Back - normal.  Breast Note:  5 cm left breast mass lower Outer quadrant and upper inner quadrant with overlapping sites which is mobile. Left axillary adenopathy noted. Nipple normal. No ulceration. Right breast shows some bruising but no masses. The right axilla is normal.  Cardiovascular Cardiovascular examination reveals -normal heart sounds, regular rate and rhythm with no murmurs and normal pedal pulses bilaterally.  Neurologic Neurologic evaluation reveals -alert and oriented x 3 with no impairment of recent or remote memory. Mental Status-Normal.  Lymphatic Axillary -Note:Bulky non-fixed left axillary adenopathy. No evidence of right axillary adenopathy.     Assessment & Plan (Asley Baskerville A. Leontine Radman MD; 10/04/2020 11:40 AM)  BREAST CANCER, LEFT (C50.912) Impression: Large bulky disease was large and bulky left axillary adenopathy. Recommend chemotherapy neoadjuvant. We'll place a Port-A-Cath. She will require mastectomy given the amount of disease of breast density as well as her very young age after talking with her. Also our genetics. Pt requires port placement for chemotherapy. Risk include bleeding, infection, pneumothorax, hemothorax, mediastinal injury, nerve injury , blood vessel injury, strke, blood clots, death, migration. embolization and need for additional procedures. Pt agrees to proceed.    Magnetic resonance imaging Genetics  Current Plans You are being scheduled for surgery- Our schedulers will call you.  You  should hear from our office's scheduling department within 5 working days about the location, date, and time of surgery. We try to make accommodations for patient's preferences in scheduling surgery, but sometimes the OR schedule or the surgeon's schedule prevents [place] from making those accommodations.  If you have not heard from our office [phone]) in 5 working days, call the office and ask for your surgeon's nurse.  If you have other questions about your diagnosis, plan, or surgery, call the office and ask for your surgeon's nurse.  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 She would prefer mastectomy which will be bilateral [facility] reconstruction after neoadjuvant chemotherapy. Referral for genetics. Discussed about breast conserving surgery as well but given her young age and bulky disease and left, she will need a left mastectomy due to size with her without chemotherapy and she would like to be symmetrical. I discussed reconstruction options as well and we can revisit this after neoadjuvant chemotherapy is done.

## 2020-10-04 NOTE — Progress Notes (Signed)
Radiation Oncology         [phone] ________________________________  Name: Gabriela Sullivan        MRN: [id number]  Date of Service: 10/04/2020 DOB: [birth year]  [id number], Aaron Edelman, MD  Erroll Luna, MD     REFERRING PHYSICIAN: Erroll Luna, MD   DIAGNOSIS: The primary encounter diagnosis was Ductal carcinoma in situ (DCIS) of right breast. A diagnosis of Malignant neoplasm of upper-outer quadrant of left breast in female, estrogen receptor positive ([place]) was also pertinent to this visit.   HISTORY OF PRESENT ILLNESS: Gabriela Sullivan is a 30 y.o. female seen in the multidisciplinary breast clinic for a new diagnosis of bilateral breast cancer. The patient was noted to have a palpable mass in the left breast as well as calcifications detected during her diagnostic work-up in the right breast.  The calcifications in the left breast were noted as well in addition to the palpable mass.  The mass was located at 3:00 and measured 5.1 cm, calcifications in the left breast around this measured up to 6 cm, and she had at least 4 abnormal appearing left axillary lymph nodes.  Her right breast showed a grouping of calcifications measuring 8 mm.  She underwent biopsies of the breast.  Biopsies on 09/25/2020 in the right breast showed low-grade DCIS, and her tumor was ER/PR positive.  A left breast biopsy was also performed in the upper outer quadrant that day which revealed a grade 2 invasive ductal carcinoma with associated DCIS calcifications and focal necrosis, this tumor was ER/PR positive, HER2 was negative and a Ki-67 was 10%. She returned for additional biopsy on 09/26/2020 of the left breast and axilla, the 3:00 biopsy in the left breast revealed a grade 2 invasive ductal carcinoma with associated DCIS, the prognostic markers were similar with a ER/PR positivity, HER2 was negative and Ki-67 was 15%.  Her left axillary lymph node biopsy was consistent with metastatic disease.  She is seen today to discuss  treatment recommendations of her cancer.   PREVIOUS RADIATION THERAPY: No   PAST MEDICAL HISTORY:  PCOS   PAST SURGICAL HISTORY:No past surgical history on file.   FAMILY HISTORY: No family history on file.   SOCIAL HISTORY:  reports that she has never smoked. She does not have any smokeless tobacco history on file.  The patient is single.  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   ALLERGIES: Patient has no known allergies.   MEDICATIONS:  Current Outpatient Medications  Medication Sig Dispense Refill  . Vitamin D, Ergocalciferol, (DRISDOL) 50000 UNITS CAPS capsule Take 50,000 Units by mouth every 7 (seven) days.     No current facility-administered medications for this encounter.     REVIEW OF SYSTEMS: On review of systems, the patient reports that she has noticed some breast pain since biopsies. She does have shortness of breath with exertion at times. At rest she is not having chest pain or trouble breathing. She reports irregular cycles as a result of PCOS.     PHYSICAL EXAM:  Wt Readings from Last 3 Encounters:  08/03/14 175 lb (79.4 kg)   Temp Readings from Last 3 Encounters:  08/03/14 98.3 F (36.8 C)   BP Readings from Last 3 Encounters:  08/03/14 132/84   Pulse Readings from Last 3 Encounters:  08/03/14 (!) 59    In general this is a well appearing hispanic female in no acute distress. She's alert and oriented x4 and appropriate throughout the examination. Cardiopulmonary assessment  is negative for acute distress and she exhibits normal effort. Bilateral breast exam is deferred.    ECOG = 1  0 - Asymptomatic (Fully active, able to carry on all predisease activities without restriction)  1 - Symptomatic but completely ambulatory (Restricted in physically strenuous activity but ambulatory and able to carry out work of a light or sedentary nature. For example, light housework, office work)  2 - Symptomatic, <50% in bed during the day  (Ambulatory and capable of all self care but unable to carry out any work activities. Up and about more than 50% of waking hours)  3 - Symptomatic, >50% in bed, but not bedbound (Capable of only limited self-care, confined to bed or chair 50% or more of waking hours)  4 - Bedbound (Completely disabled. Cannot carry on any self-care. Totally confined to bed or chair)  5 - Death   Eustace Pen MM, Creech RH, Tormey DC, et al. [phone]). "Toxicity and response criteria of the [facility]". [place] Oncol. 5 (6): 649-55    LABORATORY DATA:  Lab Results  Component Value Date   WBC 6.0 10/04/2020   HGB 13.9 10/04/2020   HCT 41.8 10/04/2020   MCV 84.1 10/04/2020   PLT 302 10/04/2020   Lab Results  Component Value Date   NA 142 10/04/2020   K 3.8 10/04/2020   CL 108 10/04/2020   CO2 25 10/04/2020   Lab Results  Component Value Date   ALT 108 (H) 10/04/2020   AST 47 (H) 10/04/2020   ALKPHOS 59 10/04/2020   BILITOT 0.7 10/04/2020      RADIOGRAPHY: [place] AXILLARY NODE CORE BIOPSY LEFT  Addendum Date: 09/27/2020   ADDENDUM REPORT: 09/27/2020 12:02 ADDENDUM: Pathology revealed GRADE II INVASIVE DUCTAL CARCINOMA, DUCTAL CARCINOMA IN SITU of the LEFT breast, 3 o'clock. This was found to be concordant by Dr. Lovey Newcomer. 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 This was found to be concordant by Dr. Lovey Newcomer. Pathology results were discussed with the patient by telephone. The patient reported doing well after the biopsies with tenderness at the sites. Post biopsy instructions and care were reviewed and questions were answered. The patient was encouraged to call [facility] for any additional concerns. My direct phone number was provided. The patient has a recent diagnosis of BILATERAL breast cancer and was referred to [facility] on [facility], 2020.  Recommendation for a bilateral breast MRI to exclude any additional sites of disease given age and breast density. There also appears to be mild skin thickening on the LEFT mammogram, recommend to evaluate clinically for inflammatory cancer. Pathology results reported by Terie Purser, RN on 09/27/2020. Electronically Signed   By: Lovey Newcomer M.D.   On: 09/27/2020 12:02   Result Date: 09/27/2020 CLINICAL DATA:  Patient with suspicious left breast mass and cortically thickened left axillary lymph node. EXAM: ULTRASOUND GUIDED LEFT BREAST CORE NEEDLE BIOPSY COMPARISON:  Previous exam(s). PROCEDURE: I met with the patient and we discussed the procedure of ultrasound-guided biopsy, including benefits and alternatives. We discussed the high likelihood of a successful procedure. We discussed the risks of the procedure, including infection, bleeding, tissue injury, clip migration, and inadequate sampling. Informed written consent was given. The usual time-out protocol was performed immediately prior to the procedure. Site 1: Left breast mass 3 o'clock position Lesion quadrant: Upper outer quadrant Using sterile technique and 1%  Lidocaine as local anesthetic, under direct ultrasound visualization, a 14 gauge spring-loaded device was used to perform biopsy of left breast mass 3 o'clock position using a lateral approach. At the conclusion of the procedure heart shaped tissue marker clip was deployed into the biopsy cavity. Follow up 2 view mammogram was performed and dictated separately. Site 2: Left axillary lymph node Lesion quadrant: Upper outer quadrant Using sterile technique and 1% Lidocaine as local anesthetic, under direct ultrasound visualization, a 14 gauge spring-loaded device was used to perform biopsy of left axillary lymph node using a lateral approach. At the conclusion of the procedure tribell tissue marker clip was deployed into the biopsy cavity. Follow up 2 view mammogram was performed and dictated  separately. IMPRESSION: Ultrasound guided biopsy of left breast mass and left axillary lymph node. No apparent complications. Electronically Signed: By: Lovey Newcomer M.D. On: 09/26/2020 15:24   MM CLIP PLACEMENT LEFT  Result Date: 09/26/2020 CLINICAL DATA:  Patient status post ultrasound-guided core needle biopsy left breast mass 3 o'clock position and cortically thickened left axillary lymph node. EXAM: DIAGNOSTIC LEFT MAMMOGRAM POST ULTRASOUND BIOPSY COMPARISON:  Previous exam(s). FINDINGS: Mammographic images were obtained following ultrasound guided biopsy of left breast mass 3 o'clock position and cortically thickened left axillary lymph node. Site 1: Left breast mass 3 o'clock position: Heart shaped clip: In appropriate position. Site 2: Left axillary lymph node: Tribell clip: In appropriate position. Previously placed coil clip is redemonstrated. IMPRESSION: Appropriate positioning of the biopsy marking clips as above. Final Assessment: Post Procedure Mammograms for Marker Placement Electronically Signed   By: Lovey Newcomer M.D.   On: 09/26/2020 15:54   MM CLIP PLACEMENT LEFT  Result Date: 09/25/2020 CLINICAL DATA:  Evaluate biopsy markers EXAM: DIAGNOSTIC BILATERAL MAMMOGRAM POST STEREOTACTIC BIOPSY COMPARISON:  Previous exam(s). FINDINGS: Mammographic images were obtained following stereotactic guided biopsy of bilateral breast calcifications. The biopsy marking clips are in expected position at the site of biopsy. IMPRESSION: The X shaped clip is in the approximate location of the biopsied right breast calcifications. The coil shaped clip is in the region of the biopsied left breast calcifications. Final Assessment: Post Procedure Mammograms for Marker Placement Electronically Signed   By: Dorise Bullion III M.D   On: 09/25/2020 10:04   MM CLIP PLACEMENT RIGHT  Result Date: 09/25/2020 CLINICAL DATA:  Evaluate biopsy markers EXAM: DIAGNOSTIC BILATERAL MAMMOGRAM POST STEREOTACTIC BIOPSY COMPARISON:   Previous exam(s). FINDINGS: Mammographic images were obtained following stereotactic guided biopsy of bilateral breast calcifications. The biopsy marking clips are in expected position at the site of biopsy. IMPRESSION: The X shaped clip is in the approximate location of the biopsied right breast calcifications. The coil shaped clip is in the region of the biopsied left breast calcifications. Final Assessment: Post Procedure Mammograms for Marker Placement Electronically Signed   By: Dorise Bullion III M.D   On: 09/25/2020 10:04   MM LT BREAST BX W LOC DEV 1ST LESION IMAGE BX SPEC STEREO GUIDE  Addendum Date: 09/27/2020   ADDENDUM REPORT: 09/26/2020 15:01 ADDENDUM: Pathology revealed GRADE II INVASIVE DUCTAL CARCINOMA, DUCTAL CARCINOMA IN SITU WITH CALCIFICATIONS AND FOCAL NECROSIS of the LEFT breast, upper outer. This was found to be concordant by Dr. Dorise Bullion. 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 This was found to be concordant by Dr. Dorise Bullion. Pathology results were discussed with the patient by telephone. The patient reported doing well after the biopsies with tenderness at the sites.  Post biopsy instructions and care were reviewed and questions were answered. The patient was encouraged to call [facility] for any additional concerns. My direct phone number was provided. The patient was referred to [facility] on Oct 05, 2019. The patient is scheduled for a LEFT breast and LEFT axillary node biopsy at [facility] on September 26, 2020. Further recommendations will be guided by the results of these biopsies. Recommendation for a bilateral breast MRI to exclude any additional sites of disease given age and breast density. There also appears to be mild skin thickening on the LEFT mammogram, recommend to evaluate clinically for inflammatory  cancer. Pathology results reported by Terie Purser, RN on 09/26/2020. Electronically Signed   By: Dorise Bullion III M.D   On: 09/26/2020 15:01   Result Date: 09/27/2020 CLINICAL DATA:  Biopsy of bilateral breast calcifications EXAM: BREAST STEREOTACTIC CORE NEEDLE BIOPSY COMPARISON:  Previous exams. FINDINGS: The patient and I discussed the procedure of stereotactic-guided biopsy including benefits and alternatives. We discussed the high likelihood of a successful procedure. We discussed the risks of the procedure including infection, bleeding, tissue injury, clip migration, and inadequate sampling. Informed written consent was given. The usual time out protocol was performed immediately prior to the procedure. Using sterile technique and 1% Lidocaine as local anesthetic, under stereotactic guidance, a 9 gauge vacuum assisted device was used to perform core needle biopsy of calcifications in the upper-outer left breast using a superior approach. Specimen radiograph was performed showing calcifications in several core specimens. Specimens with calcifications are identified for pathology. Lesion quadrant: Upper outer left breast At the conclusion of the procedure, a coil shaped tissue marker clip was deployed into the biopsy cavity. Follow-up 2-view mammogram was performed and dictated separately. Using sterile technique and 1% Lidocaine as local anesthetic, under stereotactic guidance, a 9 gauge vacuum assisted device was used to perform core needle biopsy of calcifications in the upper outer right breast using a superior approach. Specimen radiograph was performed showing calcifications in several core specimens. Specimens with calcifications are identified for pathology. Lesion quadrant: Upper outer right breast At the conclusion of the procedure, an X shaped tissue marker clip was deployed into the biopsy cavity. Follow-up 2-view mammogram was performed and dictated separately. IMPRESSION: Stereotactic-guided  biopsy of calcifications in the upper-outer left breast and the upper outer right breast. No apparent complications. Electronically Signed: By: Dorise Bullion III M.D On: 09/25/2020 09:42   [place] LT BREAST BX W LOC DEV 1ST LESION IMG BX SPEC US GUIDE  Addendum Date: 09/27/2020   ADDENDUM REPORT: 09/27/2020 12:02 ADDENDUM: Pathology revealed GRADE II INVASIVE DUCTAL CARCINOMA, DUCTAL CARCINOMA IN SITU of the LEFT breast, 3 o'clock. This was found to be concordant by Dr. Lovey Newcomer. 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 This was found to be concordant by Dr. Lovey Newcomer. Pathology results were discussed with the patient by telephone. The patient reported doing well after the biopsies with tenderness at the sites. Post biopsy instructions and care were reviewed and questions were answered. The patient was encouraged to call [facility] for any additional concerns. My direct phone number was provided. The patient has a recent diagnosis of BILATERAL breast cancer and was referred to [facility] on Oct 04, 2020. Recommendation for a bilateral breast MRI to exclude any additional  sites of disease given age and breast density. There also appears to be mild skin thickening on the LEFT mammogram, recommend to evaluate clinically for inflammatory cancer. Pathology results reported by Terie Purser, RN on 09/27/2020. Electronically Signed   By: Lovey Newcomer M.D.   On: 09/27/2020 12:02   Result Date: 09/27/2020 CLINICAL DATA:  Patient with suspicious left breast mass and cortically thickened left axillary lymph node. EXAM: ULTRASOUND GUIDED LEFT BREAST CORE NEEDLE BIOPSY COMPARISON:  Previous exam(s). PROCEDURE: I met with the patient and we discussed the procedure of ultrasound-guided biopsy, including benefits and alternatives. We discussed the high likelihood of a successful procedure. We  discussed the risks of the procedure, including infection, bleeding, tissue injury, clip migration, and inadequate sampling. Informed written consent was given. The usual time-out protocol was performed immediately prior to the procedure. Site 1: Left breast mass 3 o'clock position Lesion quadrant: Upper outer quadrant Using sterile technique and 1% Lidocaine as local anesthetic, under direct ultrasound visualization, a 14 gauge spring-loaded device was used to perform biopsy of left breast mass 3 o'clock position using a lateral approach. At the conclusion of the procedure heart shaped tissue marker clip was deployed into the biopsy cavity. Follow up 2 view mammogram was performed and dictated separately. Site 2: Left axillary lymph node Lesion quadrant: Upper outer quadrant Using sterile technique and 1% Lidocaine as local anesthetic, under direct ultrasound visualization, a 14 gauge spring-loaded device was used to perform biopsy of left axillary lymph node using a lateral approach. At the conclusion of the procedure tribell tissue marker clip was deployed into the biopsy cavity. Follow up 2 view mammogram was performed and dictated separately. IMPRESSION: Ultrasound guided biopsy of left breast mass and left axillary lymph node. No apparent complications. Electronically Signed: By: Lovey Newcomer M.D. On: 09/26/2020 15:24   MM RT BREAST BX W LOC DEV 1ST LESION IMAGE BX SPEC STEREO GUIDE  Addendum Date: 09/27/2020   ADDENDUM REPORT: 09/26/2020 15:01 ADDENDUM: Pathology revealed GRADE II INVASIVE DUCTAL CARCINOMA, DUCTAL CARCINOMA IN SITU WITH CALCIFICATIONS AND FOCAL NECROSIS of the LEFT breast, upper outer. This was found to be concordant by Dr. Dorise Bullion. 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 This was found to be concordant by Dr. Dorise Bullion. Pathology results were discussed with the patient by telephone. The patient reported doing well after the  biopsies with tenderness at the sites. Post biopsy instructions and care were reviewed and questions were answered. The patient was encouraged to call [facility] for any additional concerns. My direct phone number was provided. The patient was referred to [facility] on Oct 05, 2019. The patient is scheduled for a LEFT breast and LEFT axillary node biopsy at [facility] on September 26, 2020. Further recommendations will be guided by the results of these biopsies. Recommendation for a bilateral breast MRI to exclude any additional sites of disease given age and breast density. There also appears to be mild skin thickening on the LEFT mammogram, recommend to evaluate clinically for inflammatory cancer. Pathology results reported by Terie Purser, RN on 09/26/2020. Electronically Signed   By: Dorise Bullion III M.D   On: 09/26/2020 15:01   Result Date: 09/27/2020 CLINICAL DATA:  Biopsy of bilateral breast calcifications EXAM: BREAST STEREOTACTIC CORE NEEDLE BIOPSY COMPARISON:  Previous exams. FINDINGS: The patient and I discussed the procedure of stereotactic-guided biopsy  including benefits and alternatives. We discussed the high likelihood of a successful procedure. We discussed the risks of the procedure including infection, bleeding, tissue injury, clip migration, and inadequate sampling. Informed written consent was given. The usual time out protocol was performed immediately prior to the procedure. Using sterile technique and 1% Lidocaine as local anesthetic, under stereotactic guidance, a 9 gauge vacuum assisted device was used to perform core needle biopsy of calcifications in the upper-outer left breast using a superior approach. Specimen radiograph was performed showing calcifications in several core specimens. Specimens with calcifications are identified for pathology. Lesion quadrant: Upper outer left  breast At the conclusion of the procedure, a coil shaped tissue marker clip was deployed into the biopsy cavity. Follow-up 2-view mammogram was performed and dictated separately. Using sterile technique and 1% Lidocaine as local anesthetic, under stereotactic guidance, a 9 gauge vacuum assisted device was used to perform core needle biopsy of calcifications in the upper outer right breast using a superior approach. Specimen radiograph was performed showing calcifications in several core specimens. Specimens with calcifications are identified for pathology. Lesion quadrant: Upper outer right breast At the conclusion of the procedure, an X shaped tissue marker clip was deployed into the biopsy cavity. Follow-up 2-view mammogram was performed and dictated separately. IMPRESSION: Stereotactic-guided biopsy of calcifications in the upper-outer left breast and the upper outer right breast. No apparent complications. Electronically Signed: By: Dorise Bullion III M.D On: 09/25/2020 09:42       IMPRESSION/PLAN: 1. 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 Dr. Lisbeth Renshaw discusses the pathology findings and reviews the nature of bilateral breast disease. The consensus from the breast conference includes proceeding with neoadjuvant chemotherapy followed by bilateral mastectomies. She would also benefit from external radiotherapy to the left chest wall and regional nodes  to reduce risks of local recurrence followed by antiestrogen therapy. We discussed the risks, benefits, short, and long term effects of radiotherapy, as well as the curative intent, and the patient is interested in proceeding. Dr. Lisbeth Renshaw discusses the delivery and logistics of radiotherapy and anticipates a course of 6 1/2 weeks of radiotherapy to the left chest wall and regional nodes with deep inspiration breath hold technique. We will see her  back a few weeks after surgery to discuss the simulation process and anticipate we starting radiotherapy about 4-6 weeks after surgery.  2. Possible genetic predisposition to malignancy. The patient is a candidate for genetic testing given her personal and family history. She was offered referral and is interested in meeting with genetics today. 3. Contraceptive Counseling. The patient has PCOS and has never been pregnant. She's not currently active and is aware of the need to use condoms during treatment and for pregnancy testing prior to treatment with radiotherapy.  In a visit lasting 60 minutes, greater than 50% of the time was spent face to face reviewing her case, as well as in preparation of, discussing, and coordinating the patient's care.  The above documentation reflects my direct findings during this shared patient visit. Please see the separate note by Dr. Lisbeth Renshaw on this date for the remainder of the patient's plan of care.    Carola Rhine, [facility]    **Disclaimer: This note was dictated with voice recognition software. Similar sounding words can inadvertently be transcribed and this note may contain transcription errors which may not have been corrected upon publication of note.**

## 2020-10-04 NOTE — Progress Notes (Signed)
START ON PATHWAY REGIMEN - Breast     Cycles 1 through 4: A cycle is every 14 days:     Doxorubicin      Cyclophosphamide      [id number]    Cycles 5 through 16: A cycle is every 7 days:     Paclitaxel   **Always confirm dose/schedule in your pharmacy ordering system**  Patient Characteristics: Preoperative or Nonsurgical Candidate (Clinical Staging), Neoadjuvant Therapy followed by Surgery, Invasive Disease, Chemotherapy, HER2 Negative/Unknown/Equivocal, ER Positive Therapeutic Status: Preoperative or Nonsurgical Candidate (Clinical Staging) AJCC M Category: cM0 AJCC Grade: G2 Breast Surgical Plan: Neoadjuvant Therapy followed by Surgery ER Status: Positive (+) AJCC 8 Stage Grouping: IIA HER2 Status: Negative (-) AJCC T Category: cT3 AJCC N Category: cN1 PR Status: Positive (+) Intent of Therapy: Curative Intent, Discussed with Patient

## 2020-10-04 NOTE — H&P (Signed)
Gabriela Sullivan Appointment: 10/04/2020 9:00 AM Location: [facility] Patient #: [id number] DOB: 1990-09-04 Undefined / Language: Gabriela Sullivan / Race: Refused to Report/Unreported Female  History of Present Illness Gabriela Sullivan. Gabriela Hilburn MD; 10/04/2020 11:35 AM) Patient words: Patient presents the multidisciplinary clinic due to her history of left breast mass. As noted many months ago. She underwent workup in [place] which consisted of bilateral mammogram and ultrasound. 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 Family history: Grandmother with ovarian cancer and grandfather with prostate cancer as well as paternal cousins with breast cancer. She is sore for biopsy but has no other complaints. She's had no nipple discharge. She also was found to have disease in the left axillary lymph node basin by core biopsies well consistent with metastatic breast cancer and multiple large nodes in the left by ultrasound.                   CLINICAL DATA: Patient status post ultrasound-guided core needle biopsy left breast mass 3 o'clock position and cortically thickened left axillary lymph node.  EXAM: DIAGNOSTIC LEFT MAMMOGRAM POST ULTRASOUND BIOPSY  COMPARISON: Previous exam(s).  FINDINGS: Mammographic images were obtained following ultrasound guided biopsy of left breast mass 3 o'clock position and cortically thickened left axillary lymph node.  Site 1: Left breast mass 3 o'clock position: Heart shaped clip: In appropriate position.  Site 2: Left axillary lymph node: Tribell clip: In appropriate position.  Previously placed coil clip is redemonstrated.  IMPRESSION: Appropriate positioning of the biopsy marking clips as above.  Final Assessment: Post Procedure Mammograms for Marker Placement   Electronically Signed By:  Gabriela Sullivan M.D. On: 09/26/2020 15:54   Evaluate biopsy markers  EXAM: DIAGNOSTIC BILATERAL MAMMOGRAM POST STEREOTACTIC BIOPSY  COMPARISON: Previous exam(s).  FINDINGS: Mammographic images were obtained following stereotactic guided biopsy of bilateral breast calcifications. The biopsy marking clips are in expected position at the site of biopsy.  IMPRESSION: The X shaped clip is in the approximate location of the biopsied right breast calcifications. The coil shaped clip is in the region of the biopsied left breast calcifications.  Final Assessment: Post Procedure Mammograms for Marker Placement   Electronically Signed By: Gabriela Sullivan III M.D On: 09/25/2020 10:04     ADDITIONAL INFORMATION: 1. FLUORESCENCE IN-SITU HYBRIDIZATION Results: GROUP 5: HER2 **NEGATIVE** Equivocal form of amplification of the HER2 gene was detected in the IHC 2+ tissue sample received from this individual. HER2 FISH was performed by Sullivan technologist and cell imaging and analysis on the [employer]. RATIO OF HER2/CEN17 SIGNALS 1.66 AVERAGE HER2 COPY NUMBER PER CELL 2.65 The ratio of HER2/CEN 17 is within the range < 2.0 of HER2/CEN 17 and Sullivan copy number of HER2 signals per cell is <4.0. Arch Pathol Lab Med 1:1,2018 Gabriela Sheller MD Pathologist, Electronic Signature ( Signed 10/02/2020) 1. PROGNOSTIC INDICATORS Results: IMMUNOHISTOCHEMICAL AND MORPHOMETRIC ANALYSIS PERFORMED MANUALLY The tumor cells are EQUIVOCAL for Her2 (2+). Her2 by FISH will be performed and results reported separately. Estrogen Receptor: 60%, POSITIVE, MODERATE STAINING INTENSITY Progesterone Receptor: 10%, POSITIVE, STRONG STAINING INTENSITY Proliferation Marker Ki67: 15% REFERENCE RANGE ESTROGEN RECEPTOR NEGATIVE 0% POSITIVE =>1% REFERENCE RANGE PROGESTERONE RECEPTOR 1 of 3 FINAL for Gabriela Sullivan ([id number]) ADDITIONAL INFORMATION:(continued) NEGATIVE 0% POSITIVE =>1% All controls stained appropriately Gabriela Sheller MD Pathologist, Electronic Signature ( Signed 09/28/2020) FINAL DIAGNOSIS Diagnosis 1.  Breast, left, needle core biopsy, 3 o'clock - INVASIVE DUCTAL CARCINOMA. DUCTAL CARCINOMA IN SITU. 2. Lymph node, needle/core biopsy, left axilla - METASTATIC CARCINOMA. Microscopic Comment 1. The carcinoma appears grade 2. The greatest linear extent of tumor in any one core is approximately 10 mm. Ancillary studies will be reported separately. Results reported to [facility] on 09/27/2020. Dr. Melina Sullivan reviewed the case. Gabriela Sullivan  Diagnosis 1. Breast, left, needle core biopsy, upper outer - INVASIVE DUCTAL CARCINOMA, GRADE 2. - DUCTAL CARCINOMA IN SITU WITH CALCIFICATIONS AND FOCAL NECROSIS. - SEE MICROSCOPIC DESCRIPTION. 2. Breast, right, needle core biopsy, upper outer - LOW GRADE DUC TAL CARCINOMA IN SITU. - SEE MICROSCOPIC DESCRIPTION.  The patient is Sullivan 30 year old female.   Past Surgical History Gabriela Slipper, RN; 10/04/2020 8:03 AM) No pertinent past surgical history  Diagnostic Studies History Gabriela Slipper, RN; 10/04/2020 8:03 AM) Colonoscopy never Mammogram within last year Pap Smear 1-5 years ago  Medication History Gabriela Slipper, RN; 10/04/2020 8:02 AM) Medications Reconciled  Social History Gabriela Slipper, RN; 10/04/2020 8:03 AM) Caffeine use Carbonated beverages. No alcohol use No drug use Tobacco use Never smoker.  Family History Gabriela Slipper, RN; 10/04/2020 8:03 AM) Breast Cancer Family Members In General. Cancer Family Members In General. Diabetes Mellitus Mother. Hypertension Mother. Ovarian Cancer Family Members In General. Prostate Cancer Family Members In General.  Pregnancy / Birth History Gabriela Slipper, RN; 10/04/2020 8:03 AM) Age at menarche 61 years. Gravida 1 Irregular periods  Other Problems Gabriela Slipper, RN; 10/04/2020 8:03 AM) Asthma     Review of Systems Gabriela Slipper RN; 10/04/2020 8:03 AM) General Not Present-  Appetite Loss, Chills, Fatigue, Fever, Night Sweats, Weight Gain and Weight Loss. Skin Not Present- Change in Wart/Mole, Dryness, Hives, Jaundice, New Lesions, Non-Healing Wounds, Rash and Ulcer. HEENT Present- Wears glasses/contact lenses. Not Present- Earache, Hearing Loss, Hoarseness, Nose Bleed, Oral Ulcers, Ringing in the Ears, Seasonal Allergies, Sinus Pain, Sore Throat, Visual Disturbances and Yellow Eyes. Respiratory Not Present- Bloody sputum, Chronic Cough, Difficulty Breathing, Snoring and Wheezing. Breast Not Present- Breast Mass, Breast Pain, Nipple Discharge and Skin Changes. Cardiovascular Not Present- Chest Pain, Difficulty Breathing Lying Down, Leg Cramps, Palpitations, Rapid Heart Rate, Shortness of Breath and Swelling of Extremities. Gastrointestinal Not Present- Abdominal Pain, Bloating, Bloody Stool, Change in Bowel Habits, Chronic diarrhea, Constipation, Difficulty Swallowing, Excessive gas, Gets full quickly at meals, Hemorrhoids, Indigestion, Nausea, Rectal Pain and Vomiting. Female Genitourinary Not Present- Frequency, Nocturia, Painful Urination, Pelvic Pain and Urgency. Musculoskeletal Present- Back Pain. Not Present- Joint Pain, Joint Stiffness, Muscle Pain, Muscle Weakness and Swelling of Extremities. Neurological Present- Numbness. Not Present- Decreased Memory, Fainting, Headaches, Seizures, Tingling, Tremor, Trouble walking and Weakness. Psychiatric Not Present- Anxiety, Bipolar, Change in Sleep Pattern, Depression, Fearful and Frequent crying. Endocrine Not Present- Cold Intolerance, Excessive Hunger, Hair Changes, Heat Intolerance, Hot flashes and New Diabetes. Hematology Not Present- Blood Thinners, Easy Bruising, Excessive bleeding, Gland problems, HIV and Persistent Infections.   Physical Exam (Mattye Verdone Sullivan. Vivianne Carles MD; 10/04/2020 11:37 AM)  General Mental Status-Alert. General Appearance-Consistent with stated age. Hydration-Well  hydrated. Voice-Normal.  Head and Neck Head-normocephalic, atraumatic with no lesions or palpable masses. Trachea-midline. Thyroid Gland Characteristics - normal size and consistency.  Chest and Lung Exam Chest and lung exam reveals -quiet, even and easy respiratory effort with no use of accessory muscles and on auscultation, normal breath sounds, no adventitious sounds and normal vocal resonance. Inspection Chest Wall - Normal. Back - normal.  Breast Note:  5 cm left breast mass lower Outer quadrant and upper inner quadrant with overlapping sites which is mobile. Left axillary adenopathy noted. Nipple normal. No ulceration. Right breast shows some bruising but no masses. The right axilla is normal.  Cardiovascular Cardiovascular examination reveals -normal heart sounds, regular rate and rhythm with no murmurs and normal pedal pulses bilaterally.  Neurologic Neurologic evaluation reveals -alert and oriented x 3 with no impairment of recent or remote memory. Mental Status-Normal.  Lymphatic Axillary -Note:Bulky non-fixed left axillary adenopathy. No evidence of right axillary adenopathy.     Assessment & Plan (Maryjane Benedict Sullivan. Turner Kunzman MD; 10/04/2020 11:40 AM)  BREAST CANCER, LEFT (C50.912) Impression: Large bulky disease was large and bulky left axillary adenopathy. Recommend chemotherapy neoadjuvant. We'll place Sullivan Port-Sullivan-Cath. She will require mastectomy given the amount of disease of breast density as well as her very young age after talking with her. Also our genetics. Pt requires port placement for chemotherapy. Risk include bleeding, infection, pneumothorax, hemothorax, mediastinal injury, nerve injury , blood vessel injury, strke, blood clots, death, migration. embolization and need for additional procedures. Pt agrees to proceed.    Magnetic resonance imaging Genetics  Current Plans You are being scheduled for surgery- Our schedulers will call you.  You  should hear from our office's scheduling department within 5 working days about the location, date, and time of surgery. We try to make accommodations for patient's preferences in scheduling surgery, but sometimes the OR schedule or the surgeon's schedule prevents [place] from making those accommodations.  If you have not heard from our office [phone]) in 5 working days, call the office and ask for your surgeon's nurse.  If you have other questions about your diagnosis, plan, or surgery, call the office and ask for your surgeon's nurse.  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 She would prefer mastectomy which will be bilateral [facility] reconstruction after neoadjuvant chemotherapy. Referral for genetics. Discussed about breast conserving surgery as well but given her young age and bulky disease and left, she will need Sullivan left mastectomy due to size with her without chemotherapy and she would like to be symmetrical. I discussed reconstruction options as well and we can revisit this after neoadjuvant chemotherapy is done.

## 2020-10-04 NOTE — Patient Instructions (Signed)

## 2020-10-04 NOTE — Progress Notes (Signed)
REFERRING PROVIDER: Chauncey Cruel, MD [address] [place],   [address]  PRIMARY PROVIDER:  [name], MD  PRIMARY REASON FOR VISIT:  1. Ductal carcinoma in situ (DCIS) of right breast   2. Malignant neoplasm of upper-outer quadrant of left breast in female, estrogen receptor positive ([place])   3. Family history of ovarian cancer   4. Family history of prostate cancer   5. Family history of breast cancer   6. Family history of stomach cancer      HISTORY OF PRESENT ILLNESS:   Gabriela Sullivan, a 30 y.o. female, was seen for a [place] cancer genetics consultation at the request of Dr. Jana Hakim due to a personal and family history of cancer.  Gabriela Sullivan presents to clinic today with her friend to discuss the possibility of a hereditary predisposition to cancer, genetic testing, and to further clarify her future cancer risks, as well as potential cancer risks for family members.   In April of 2022, at the age of 38, Gabriela Sullivan was diagnosed with ductal carcinoma in situ of the right breast, and invasive ductal carcinoma and ductal carcinoma in situ of the left breast. The right breast tumor is ER+ and PR+, and the left breast tumor is ER+/PR+/Her2-. The treatment plan includes neoadjuvant chemotherapy, surgery, radiation therapy, and antiestrogen therapy.    CANCER HISTORY:  Oncology History  Ductal carcinoma in situ (DCIS) of right breast  09/29/2020 Initial Diagnosis   Ductal carcinoma in situ (DCIS) of right breast   10/04/2020 Cancer Staging   Staging form: Breast, AJCC 8th Edition - Clinical: Stage 0 (cTis (DCIS), cN0, cM0) - Signed by Chauncey Cruel, MD on 10/04/2020   Malignant neoplasm of upper-outer quadrant of left breast in female, estrogen receptor positive ([place])  09/29/2020 Initial Diagnosis   Malignant neoplasm of upper-outer quadrant of left breast in female, estrogen receptor positive ([place])   10/04/2020 Cancer Staging   Staging form: Breast, AJCC 8th  Edition - Clinical stage from 10/04/2020: Stage IIA (cT3, cN1(f), cM0, G2, ER+, PR+, HER2-) - Signed by Chauncey Cruel, MD on 10/04/2020 Stage prefix: Initial diagnosis Method of lymph node assessment: Core biopsy Histologic grading system: 3 grade system   10/25/2020 -  Chemotherapy    Patient is on Treatment Plan: BREAST ADJUVANT DOSE DENSE AC Q14D / PACLITAXEL Q7D         RISK FACTORS:  Menarche was at age 45.  No live births.  OCP use: never.  Ovaries intact: yes.  Hysterectomy: no.  Menopausal status: premenopausal.  HRT use: 0 years. Colonoscopy: n/a.  Mammogram within the last year: yes.   Past Medical History:  Diagnosis Date  . Asthma    remote  . Breast cancer ([place])   . Family history of breast cancer   . Family history of ovarian cancer   . Family history of prostate cancer   . Family history of stomach cancer   . Migraines   . PCOS (polycystic ovarian syndrome)     No past surgical history on file.  Social History   Socioeconomic History  . Marital status: Single    Spouse name: Not on file  . Number of children: Not on file  . Years of education: Not on file  . Highest education level: Not on file  Occupational History  . Not on file  Tobacco Use  . Smoking status: Never Smoker  . Smokeless tobacco: Never Used  Substance and Sexual Activity  .  Alcohol use: Never  . Drug use: Never  . Sexual activity: Not on file  Other Topics Concern  . Not on file  Social History Narrative  . Not on file   Social Determinants of Health   Financial Resource Strain: Not on file  Food Insecurity: Not on file  Transportation Needs: No Transportation Needs  . Lack of Transportation (Medical): No  . Lack of Transportation (Non-Medical): No  Physical Activity: Not on file  Stress: Not on file  Social Connections: Not on file     FAMILY HISTORY:  We obtained a detailed, 4-generation family history.  Significant diagnoses are listed below: Family  History  Problem Relation Age of Onset  . Ovarian cancer Maternal Grandmother        unknown age of diagnosis  . Prostate cancer Maternal Grandfather        metastatic  . Breast cancer Cousin        unknown age of diagnosis, maternal first cousin  . Stomach cancer Cousin        dx 20s/30s, maternal first cousin   Gabriela Sullivan does not have any children. She has one maternal half-brother (age 28) and one maternal half-sister (age 11), neither of whom have had cancer.  Gabriela Sullivan mother is alive at age 44 without cancer. There is one maternal aunt and one maternal uncle currently living. Some maternal aunts/uncles have passed away, although Gabriela Sullivan does not know how many. There is no known cancer among maternal aunts/uncles. Two maternal first cousins had cancer - one had breast cancer (unknown age of diagnosis), and the other died from stomach cancer in her 3s or 33s. Gabriela Sullivan maternal grandmother died older than 51 with ovarian cancer (unknown age of diagnosis). Her maternal grandfather died older than 45 with metastatic prostate cancer.  Gabriela Sullivan does not have any information about her father or his side of the family.  Gabriela Sullivan is unaware of previous family history of genetic testing for hereditary cancer risks. Patient's maternal ancestors are of [place] descent, and paternal ancestors are of unknown descent. There is no reported Ashkenazi Jewish ancestry. There is no known consanguinity.  GENETIC COUNSELING ASSESSMENT: Gabriela Sullivan is a 30 y.o. female with a personal history of bilateral breast cancer and a family history of ovarian cancer, prostate cancer, breast cancer, and stomach cancer, which is somewhat suggestive of a hereditary cancer syndrome and predisposition to cancer. We, therefore, discussed and recommended the following at today's visit.   DISCUSSION: We discussed that approximately 5-10% of breast cancer is hereditary, with most cases associated with the BRCA1 and BRCA2 genes.  There are other genes that can be associated with hereditary breast cancer syndromes. These include ATM, CHEK2, PALB2 etc. We discussed that testing is beneficial for several reasons, including knowing about other cancer risks, identifying potential screening and risk-reduction options that may be appropriate, and to understand if other family members could be at risk for cancer and allow them to undergo genetic testing.   We reviewed the characteristics, features and inheritance patterns of hereditary cancer syndromes. We also discussed genetic testing, including the appropriate family members to test, the process of testing, insurance coverage and turn-around-time for results. We discussed the implications of a negative, positive and/or variant of uncertain significant result. We recommended Gabriela Sullivan pursue genetic testing for the [facility] CancerNext-Expanded + RNAinsight gene panel.   The CancerNext-Expanded + RNAinsight gene panel offered by [facility] and includes sequencing and rearrangement analysis for the following  77 genes: AIP, ALK, APC, ATM, AXIN2, BAP1, BARD1, BLM, BMPR1A, BRCA1, BRCA2, BRIP1, CDC73, CDH1, CDK4, CDKN1B, CDKN2A, CHEK2, CTNNA1, DICER1, FANCC, FH, FLCN, GALNT12, KIF1B, LZTR1, MAX, MEN1, MET, MLH1, MSH2, MSH3, MSH6, MUTYH, NBN, NF1, NF2, NTHL1, PALB2, PHOX2B, PMS2, POT1, PRKAR1A, PTCH1, PTEN, RAD51C, RAD51D, RB1, RECQL, RET, SDHA, SDHAF2, SDHB, SDHC, SDHD, SMAD4, SMARCA4, SMARCB1, SMARCE1, STK11, SUFU, TMEM127, TP53, TSC1, TSC2, VHL and XRCC2 (sequencing and deletion/duplication); EGFR, EGLN1, HOXB13, KIT, MITF, PDGFRA, POLD1 and POLE (sequencing only); EPCAM and GREM1 (deletion/duplication only). RNA data is routinely analyzed for use in variant interpretation for all genes.  Based on Gabriela Sullivan personal and family history of cancer, she meets medical criteria for genetic testing. Despite that she meets criteria, there may still be an out of pocket cost.   PLAN: After considering  the risks, benefits, and limitations, Gabriela Sullivan provided informed consent to pursue genetic testing and the blood Sullivan was sent to [facility] for analysis of the CancerNext-Expanded + RNAinsight panel. Results should be available within approximately two-three weeks' time, at which point they will be disclosed by telephone to Gabriela Sullivan, as will any additional recommendations warranted by these results. Gabriela Sullivan will receive a summary of her genetic counseling visit and a copy of her results once available. This information will also be available in Epic.  Gabriela Sullivan questions were answered to her satisfaction today. Our contact information was provided should additional questions or concerns arise. Thank you for the referral and allowing [place] to share in the care of your patient.   Clint Guy, [place], [facility] Licensed, Certified Dispensing [email or web address] Phone: [phone]  The patient was seen for a total of 20 minutes in face-to-face genetic counseling.  This patient was discussed with Drs. Magrinat, Lindi Adie and/or Burr Medico who agrees with the above.    _______________________________________________________________________ For Office Staff:  Number of people involved in session: 1 Was an Intern/ student involved with case: no

## 2020-10-04 NOTE — Progress Notes (Signed)
[facility]  Initial Assessment   Gabriela Sullivan is a 30 y.o. year old female accompanied by patient and friend. Clinical Social Work was referred by [facility] for assessment of psychosocial needs.   SDOH (Social Determinants of Health) assessments performed: Yes SDOH Interventions   Flowsheet Row Most Recent Value  SDOH Interventions   Housing Interventions Intervention Not Indicated  Transportation Interventions Intervention Not Indicated      Distress Screen completed: Yes ONCBCN DISTRESS SCREENING 10/04/2020  Screening Type Initial Screening  Distress experienced in past week (1-10) 7  Practical problem type Insurance  Emotional problem type Nervousness/Anxiety;Adjusting to illness  Physical Problem type Tingling hands/feet    Family/Social Information:  . Housing Arrangement: patient lives with her parents . Family members/support persons in your life? Friends, coworkers, parents . Transportation concerns: no  . Employment: Working full time as Freight forwarder at a [employer]. Income source: Employment . Financial concerns: Yes, current concerns o Type of concern: Insurance (lack of) . Food access concerns: no . Medication Concerns: concerns about paying for tx  . Services Currently in place:  n/a  Coping/ Adjustment to diagnosis: . Patient understands treatment plan and what happens next? yes, a little overwhelmed by information but generally understands the plan . Concerns about diagnosis and/or treatment: Overwhelmed by information and How I will pay for the services I need . Patient reported stressors: Insurance, Anxiety and Adjusting to my illness . Patient enjoys music . Current coping skills/ strengths: Motivation for treatment/growth and Supportive family/friends    SUMMARY: Current SDOH Barriers:  . lack of insurance  Clinical Social Work Clinical Goal(s):  Marland Kitchen Patient will follow-up when contacted by [facility] program and/or financial counselors regarding  insurance  Interventions: . Discussed common feeling and emotions when being diagnosed with cancer, and the importance of support during treatment . Informed patient of the support team roles and support services at [facility] . Provided CSW contact information and encouraged patient to call with any questions or concerns . Referred patient to B[facility]CP program to determine if she can qualify for Medicaid   Follow Up Plan: Patient will contact CSW with any support or resource needs Patient verbalizes understanding of plan: Yes    Christeen Douglas , LCSW

## 2020-10-05 ENCOUNTER — Telehealth: Payer: Self-pay | Admitting: *Deleted

## 2020-10-05 ENCOUNTER — Encounter: Payer: Self-pay | Admitting: *Deleted

## 2020-10-05 NOTE — Telephone Encounter (Signed)
Pt return call regarding [place] from 5.4.22. Denies questions or concerns regarding dx or treatment care plan. Confirmed future appts. Encourage pt to call with needs. Received verbal understanding.

## 2020-10-05 NOTE — Progress Notes (Signed)
..  The following Medication: Gabriela Sullivan is approved for drug replacement program by [employer]. The enrollment period is from 10/05/2020 to 10/05/2021.  Reason for Assistance: Self Pay. ID: [id number] First DOS:10/27/2020.

## 2020-10-05 NOTE — Telephone Encounter (Signed)
Left vm for pt regarding [facility] from 5.4.22. Contact information provided for questions or needs.

## 2020-10-06 ENCOUNTER — Other Ambulatory Visit: Payer: Self-pay

## 2020-10-06 ENCOUNTER — Ambulatory Visit
Admission: RE | Admit: 2020-10-06 | Discharge: 2020-10-06 | Disposition: A | Discharge status: Home / Self Care | Payer: No Typology Code available for payment source | Source: Ambulatory Visit | Attending: Family Medicine | Admitting: Family Medicine

## 2020-10-06 DIAGNOSIS — C50912 Malignant neoplasm of unspecified site of left female breast: Secondary | ICD-10-CM

## 2020-10-06 DIAGNOSIS — C50911 Malignant neoplasm of unspecified site of right female breast: Secondary | ICD-10-CM

## 2020-10-06 IMAGING — MR MR BREAST BILAT WO/W CM
8 of 13 series · 29 of 48 positions shown · IV contrast (10 ml gadavist)
Comparison: Prior exams.

CLINICAL DATA: Recent diagnosis left grade 2 invasive ductal
carcinoma and ductal carcinoma in situ, low grade ductal carcinoma
in situ of the right breast and a metastatic left axillary lymph
node.

LABS:  No labs drawn at time of imaging.
EXAM:
BILATERAL BREAST MRI WITH AND WITHOUT CONTRAST
TECHNIQUE: Multiplanar, multisequence MR images of both breasts were obtained
prior to and following the intravenous administration of 10 ml of
Gadavist

[Series 2: t2_tirm_tra ipat (a-p) · axial · 3.0mm · 0.74mm/px · 1 of 55 slices shown]
[im 1/55]
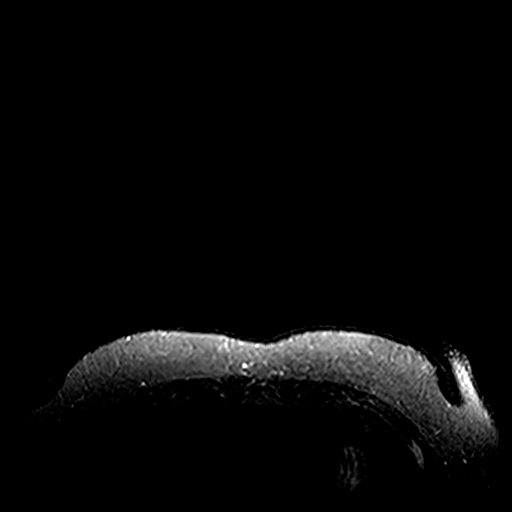

[Series 3: fl3d pre-cm no · axial · non-contrast · 1.2mm · 0.99mm/px · z∈[-74,+98]mm · 5 of 144 slices shown]
[im 1/144]
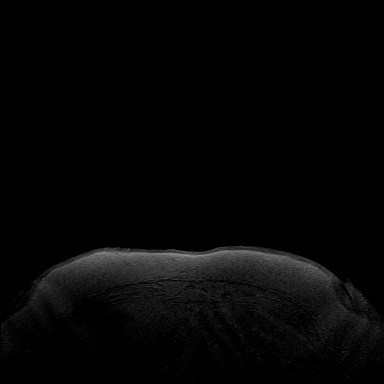
[im 36/144]
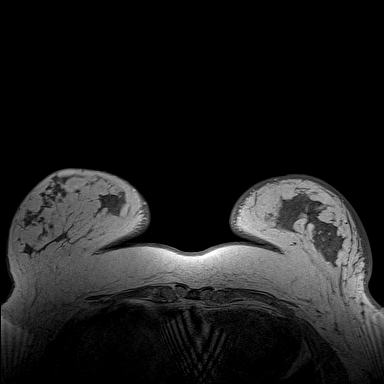
[im 72/144]
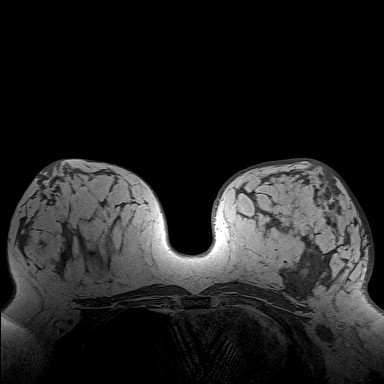
[im 108/144]
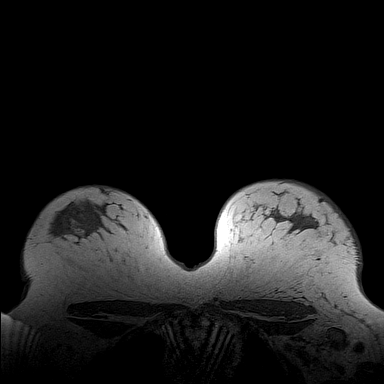
[im 144/144]
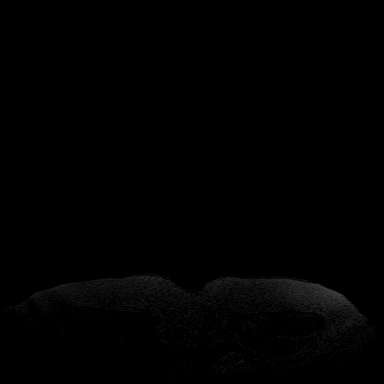

[Series 4: fl3d pre-cm · axial · non-contrast · 1.2mm · 0.99mm/px · z∈[-74,+98]mm · 5 of 144 slices shown]
[im 1/144]
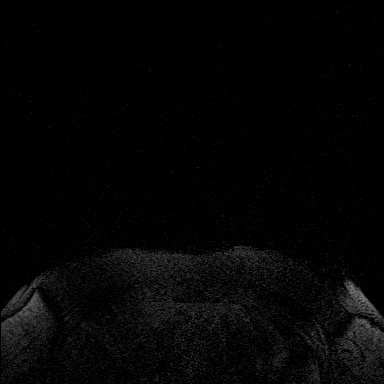
[im 36/144]
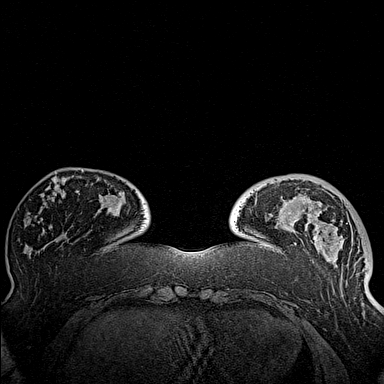
[im 72/144]
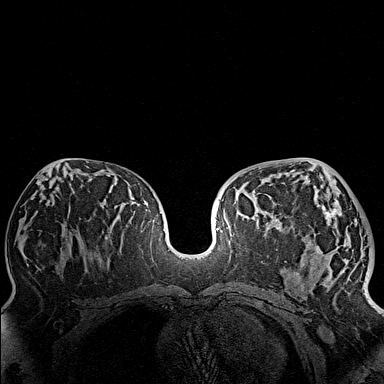
[im 108/144]
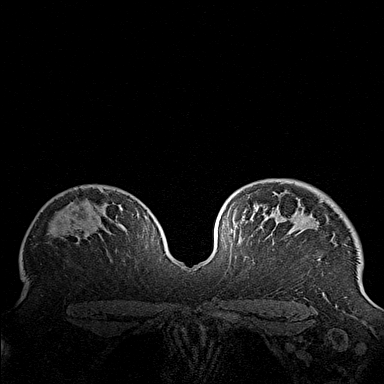
[im 144/144]
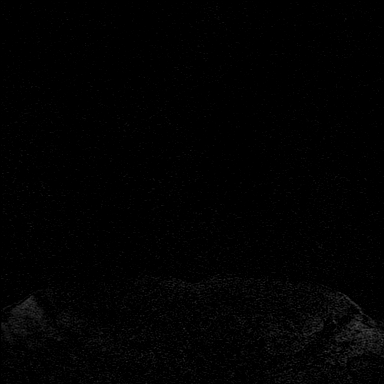

[Series 5: fl3d post-cm 20 · axial · 1.2mm · 0.99mm/px · z∈[-74,+98]mm · 5 of 144 slices shown (1 of 3)]
[im 1/144]
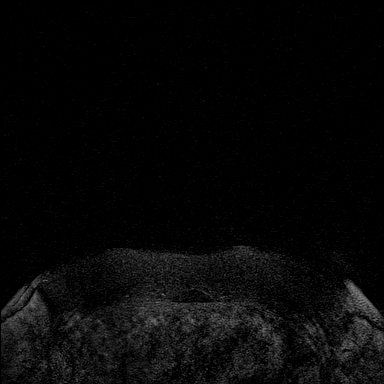
[im 36/144]
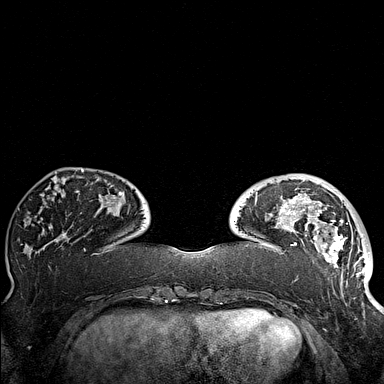
[im 72/144]
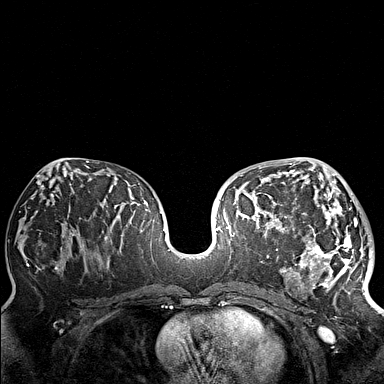
[im 108/144]
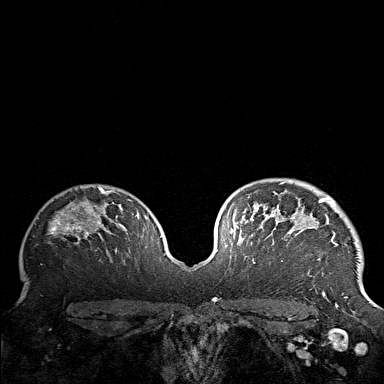
[im 144/144]
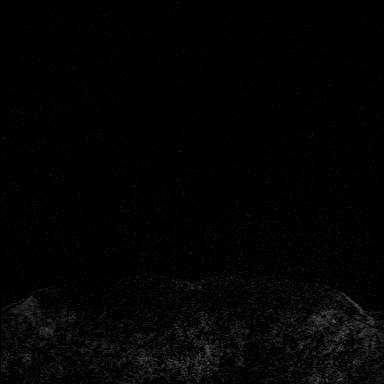

[Series 6: fl3d post-cm 20 · axial · 1.2mm · 0.99mm/px · z∈[-74,+98]mm · 5 of 144 slices shown (2 of 3)]
[im 1/144]
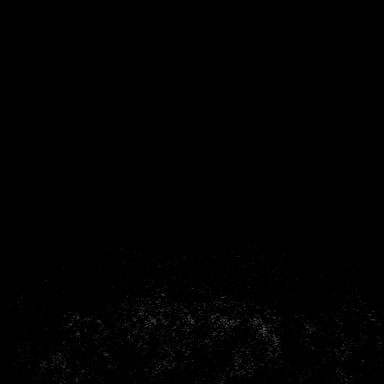
[im 36/144]
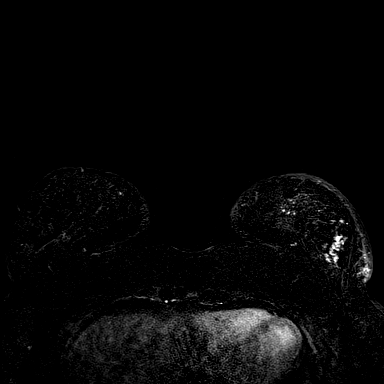
[im 72/144]
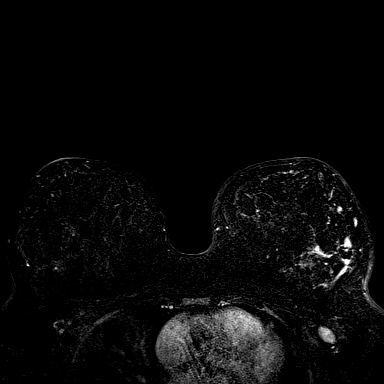
[im 108/144]
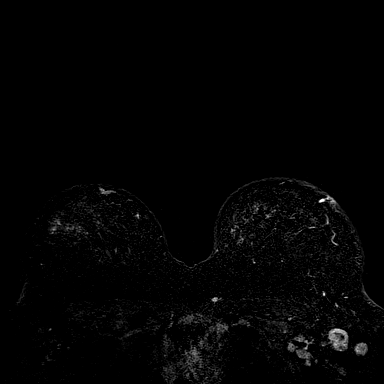
[im 144/144]
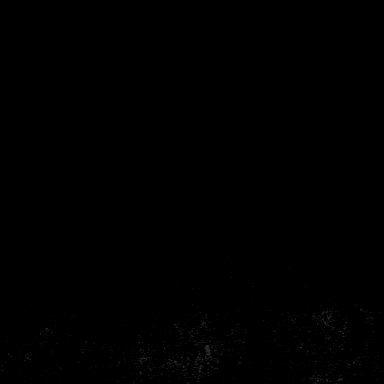

[Series 7: fl3d post-cm 20 · axial · 172.8mm · 0.99mm/px · 1 of 1 slices shown (3 of 3)]
[im 1/1]
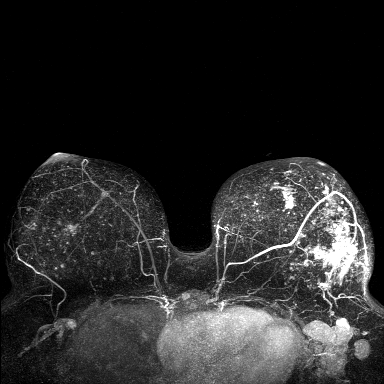

[Series 8: fl3d post-cm 3min · axial · 1.2mm · 0.99mm/px · z∈[-74,+98]mm · 5 of 144 slices shown]
[im 1/144]
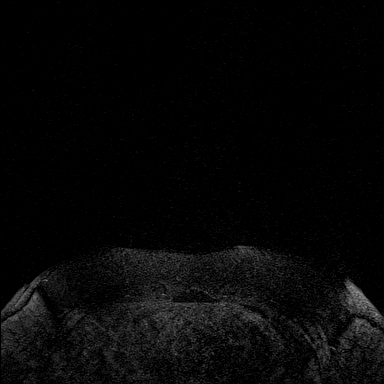
[im 36/144]
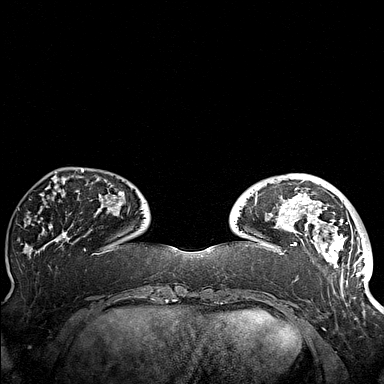
[im 72/144]
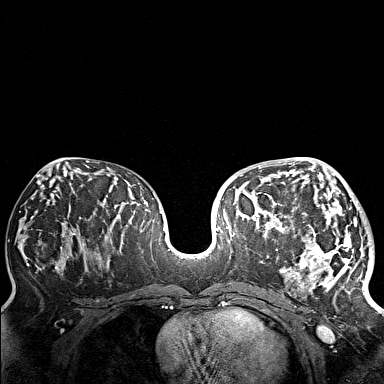
[im 108/144]
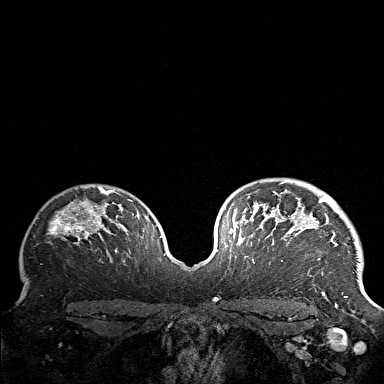
[im 144/144]
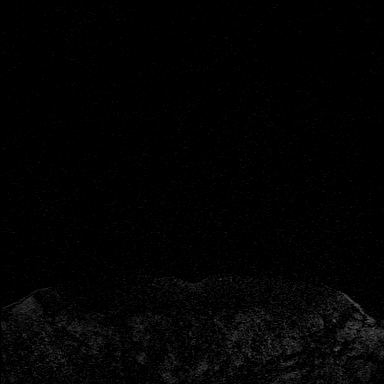

[Series 9: fl3d post-cm 3min_sub · axial · 1.2mm · 0.99mm/px · z∈[-74,-40]mm · 2 of 144 slices shown]
[im 1/144]
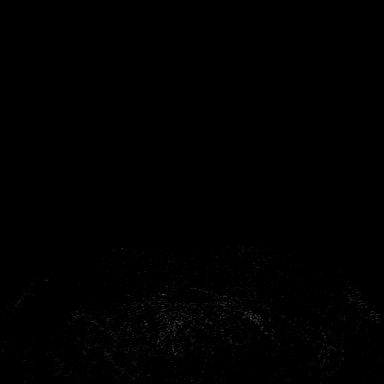
[im 29/144]
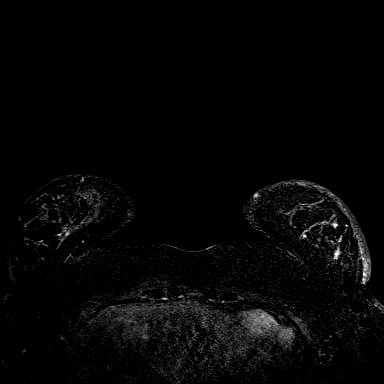

[29 of 48 positions shown; findings below may reference images not displayed]

Three-dimensional MR images were rendered by post-processing of the
original MR data on an independent workstation. The
three-dimensional MR images were interpreted, and findings are
reported in the following complete MRI report for this study. Three
dimensional images were evaluated at the independent interpreting
workstation using the DynaCAD thin client.
FINDINGS: Breast composition: c. Heterogeneous fibroglandular tissue.

Background parenchymal enhancement: Mild

Right breast: There is enhancement extending obliquely from superior
to inferior consistent with the previous stereotactic biopsy tract.
Subtle susceptibility artifact lies along the inferior aspect of
this tract consistent with the post biopsy X shaped biopsy clip. At
the level of the clip, surrounding enhancement measures 8 mm in
greatest dimension. This reflects the low-grade DCIS found on
biopsy.

There are no other areas of abnormal right breast enhancement. No
defined masses.

Left breast: Large heterogeneous area predominantly non mass
enhancement extends from the anterior to the posterior breast,
throughout the upper outer and lower outer quadrants, largest
discrete area in the lower outer quadrant, where there is a small
focus of susceptibility artifact consistent with the heart shaped
biopsy clip. This portion of the abnormal enhancement, in the lower
outer quadrant, spans approximately 5.8 x 3.8 cm transversely. The
overall span of the abnormal enhancement measures 11 cm
anterior-posterior by 9 cm, superior inferior, by 7 cm right to
left.

There is separate mass-like area of abnormal enhancement along the
inferior, anterior breast, projecting near 6 o'clock, measuring
x 0.9 x 1.2 cm. This area demonstrates washout kinetics.

There is skin thickening with hazy increased skin enhancement
throughout the left breast most prominent laterally.

Lymph nodes: Multiple enlarged/abnormal left axillary lymph nodes.
Artifact from the tribell biopsy clip lies within one of the more
lateral lymph nodes. Largest node measures 2.4 cm in short axis. Six
lymph nodes are abnormal by size and/or cortical thickness. There
are additional prominent subcentimeter lymph nodes. No enlarged or
abnormal right axillary lymph nodes.

Ancillary findings:  None.
IMPRESSION: 1. Extensive abnormal, predominantly non mass enhancement throughout
the lateral left breast involving the lower outer and upper outer
quadrants, consistent with a more extensive expanse of malignancy
than evident on mammography or ultrasound. This spans 11 x 7 x 9 cm.
2. Additional mass-like abnormal enhancement in the anterior,
inferior left breast suspicious for an additional focus of
carcinoma. This spans 1.9 x 0.9 x 1.2 cm.
3. Skin thickening and enhancement of the left breast, which
predominates laterally. Findings concerning for inflammatory breast
carcinoma.
4. 8 mm area of enhancement surrounds the X shaped biopsy clip in
right breast, reflecting the biopsy-proven DCIS. Enhancement is also
noted along the biopsy tract. There are no findings to suggest
additional right breast malignancy.
5. Multiple abnormal left axillary lymph nodes, 6 of which are
enlarged/abnormal by size/cortical thickness, 1 of these being the
biopsy-proven metastatic lymph node.

RECOMMENDATION:
1. MRI guided biopsy the 1.9 cm mass-like area of abnormal
enhancement along the anterior, inferior left breast, if this would
alter treatment.
2. Otherwise treatment as planned for the known multifocal left
breast carcinoma and metastatic lymphadenopathy and small focus of
right breast DCIS.

BI-RADS CATEGORY  4: Suspicious.

## 2020-10-06 MED ORDER — GADOBUTROL 1 MMOL/ML IV SOLN
10.0000 mL | Freq: Once | INTRAVENOUS | Status: AC | PRN
  Administered 2020-10-06: 10 mL via INTRAVENOUS

## 2020-10-09 ENCOUNTER — Encounter: Payer: Self-pay | Admitting: *Deleted

## 2020-10-09 NOTE — Progress Notes (Signed)
..  The following Medication: Zoladex has been approved thru [facility] as Assistance Program. Enrollment period is 10/05/2020 to 10/05/2021.  Assistance ID: [id number].  Reason for Assistance: Self Pay First DOS: 10/10/2020.

## 2020-10-10 ENCOUNTER — Encounter: Payer: Self-pay | Admitting: Oncology

## 2020-10-10 ENCOUNTER — Telehealth: Payer: Self-pay | Admitting: Oncology

## 2020-10-10 NOTE — Progress Notes (Signed)
Created and printed GFE(Good Faith Estimate) to provide to patient.  

## 2020-10-10 NOTE — Telephone Encounter (Signed)
Sdch per 5/4 los, pt aware.

## 2020-10-11 ENCOUNTER — Inpatient Hospital Stay: Payer: Medicaid Other

## 2020-10-11 ENCOUNTER — Encounter (HOSPITAL_BASED_OUTPATIENT_CLINIC_OR_DEPARTMENT_OTHER): Payer: Self-pay | Admitting: Surgery

## 2020-10-11 ENCOUNTER — Encounter: Payer: Self-pay | Admitting: Oncology

## 2020-10-11 ENCOUNTER — Other Ambulatory Visit: Payer: Self-pay

## 2020-10-11 VITALS — BP 144/86 | HR 63 | Resp 18

## 2020-10-11 DIAGNOSIS — C50412 Malignant neoplasm of upper-outer quadrant of left female breast: Secondary | ICD-10-CM | POA: Diagnosis not present

## 2020-10-11 DIAGNOSIS — Z170 Estrogen receptor positive status [ER+]: Secondary | ICD-10-CM

## 2020-10-11 MED ORDER — GOSERELIN ACETATE 3.6 MG ~~LOC~~ IMPL
DRUG_IMPLANT | SUBCUTANEOUS | Status: AC
  Filled 2020-10-11: qty 3.6

## 2020-10-11 MED ORDER — GOSERELIN ACETATE 3.6 MG ~~LOC~~ IMPL
3.6000 mg | DRUG_IMPLANT | Freq: Once | SUBCUTANEOUS | Status: AC
Start: 2020-10-11 — End: 2020-10-11
  Administered 2020-10-11: 3.6 mg via SUBCUTANEOUS

## 2020-10-11 NOTE — Patient Instructions (Signed)

## 2020-10-11 NOTE — Progress Notes (Signed)
Met with uninsured patient at registration to introduce myself as Arboriculturist and to offer available resources.  Provided GFE(Good Faith Estimate) and explanation sheet which reflects the 57% automatic uninsured discount.   Discussed one-time $1000 Radio broadcast assistant to assist with personal expenses while going through treatment. Patient had pay stub with her to provide needed information.  Patient approved for one-time grant to assist with personal expenses while going through treatment. Discussed in detail expenses and how they are covered. She received a gift card today from her grant.  Kathleen Lime in pharmacy a staff message regarding drug assistance.  Talked with Altha Harm regarding [place] Medicaid. She advised me to send a message to herself and Sherie. Advised patient someone would contact her regarding this.    Patient has my card for any additional financial questions or concerns.

## 2020-10-13 ENCOUNTER — Encounter (HOSPITAL_COMMUNITY): Payer: Self-pay

## 2020-10-13 ENCOUNTER — Ambulatory Visit (HOSPITAL_COMMUNITY)
Admission: RE | Admit: 2020-10-13 | Discharge: 2020-10-13 | Disposition: A | Discharge status: Home / Self Care | Payer: Medicaid Other | Source: Ambulatory Visit | Attending: Oncology | Admitting: Oncology

## 2020-10-13 ENCOUNTER — Encounter (HOSPITAL_COMMUNITY)
Admission: RE | Admit: 2020-10-13 | Discharge: 2020-10-13 | Disposition: A | Discharge status: Home / Self Care | Payer: Medicaid Other | Source: Ambulatory Visit | Attending: Oncology | Admitting: Oncology

## 2020-10-13 ENCOUNTER — Inpatient Hospital Stay: Payer: Medicaid Other

## 2020-10-13 ENCOUNTER — Other Ambulatory Visit: Payer: Self-pay

## 2020-10-13 DIAGNOSIS — Z170 Estrogen receptor positive status [ER+]: Secondary | ICD-10-CM

## 2020-10-13 DIAGNOSIS — Z01818 Encounter for other preprocedural examination: Secondary | ICD-10-CM | POA: Diagnosis not present

## 2020-10-13 DIAGNOSIS — Z0189 Encounter for other specified special examinations: Secondary | ICD-10-CM

## 2020-10-13 DIAGNOSIS — C50412 Malignant neoplasm of upper-outer quadrant of left female breast: Secondary | ICD-10-CM

## 2020-10-13 LAB — ECHOCARDIOGRAM COMPLETE
Area-P 1/2: 3.36 cm2
S' Lateral: 2.5 cm

## 2020-10-13 IMAGING — CT CT CHEST W/ CM
2 of 4 series · 15 of 36 positions shown, 18 images · IV contrast (omnipaque)
Comparison: None

CLINICAL DATA: Staging left breast cancer.

EXAM:
CT CHEST WITH CONTRAST
TECHNIQUE: Multidetector CT imaging of the chest was performed during
intravenous contrast administration.
CONTRAST:  60mL OMNIPAQUE IOHEXOL 300 MG/ML  SOLN

[Series 2: axial st · axial · 0.73mm/px · z∈[+1203,+1457]mm · 12 of 151 slices shown, 15 images]
[im 12/151  mediastinal]
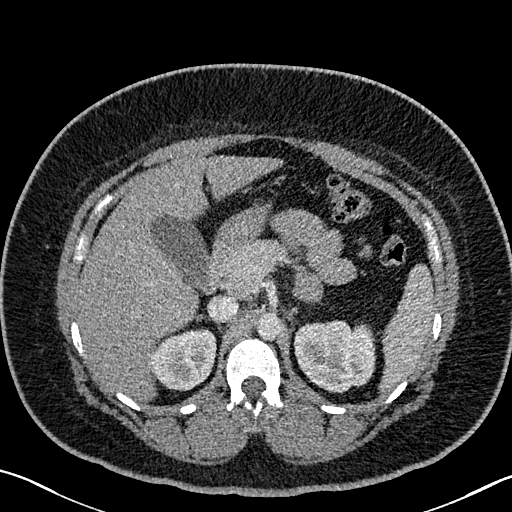
[im 12/151  lung]
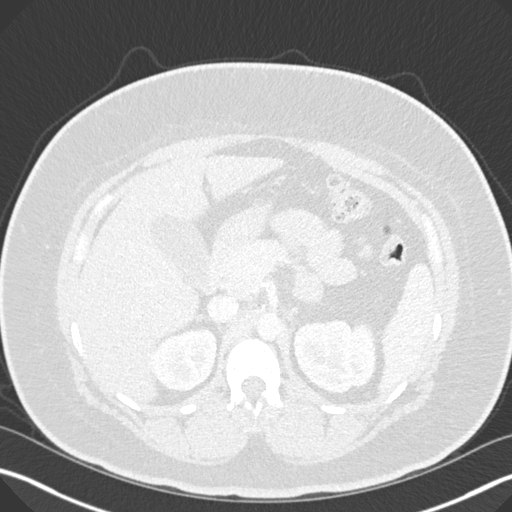
[im 24/151  lung]
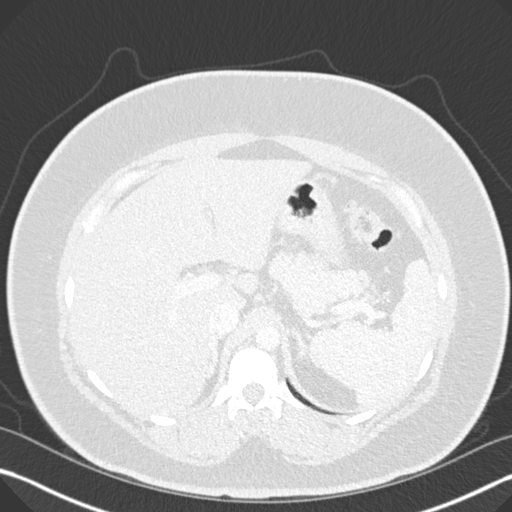
[im 35/151  lung]
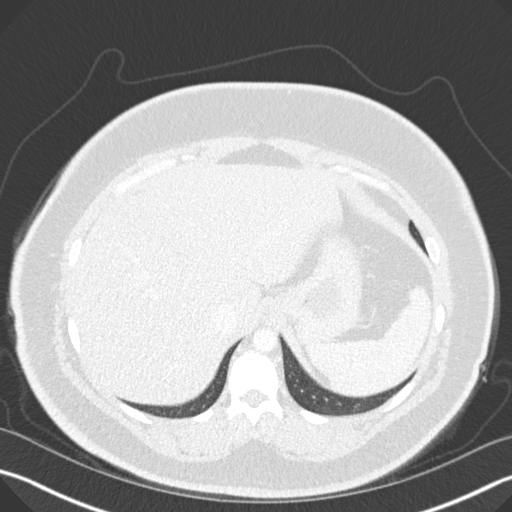
[im 47/151  lung]
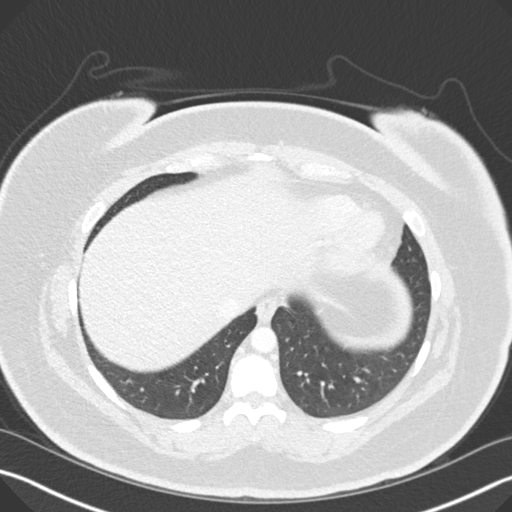
[im 58/151  mediastinal]
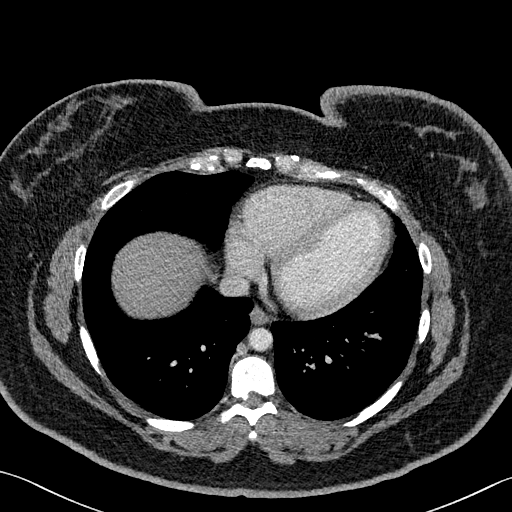
[im 58/151  lung]
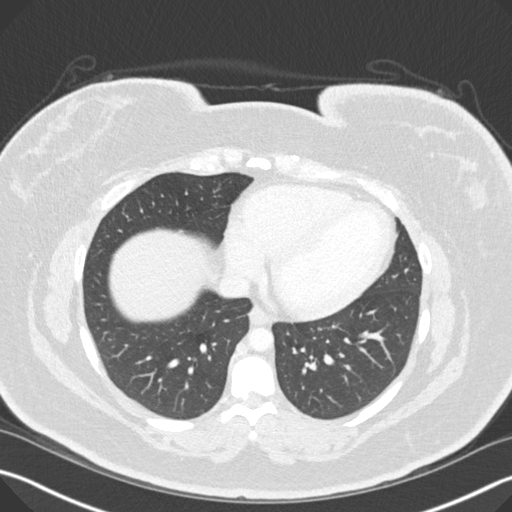
[im 70/151  lung]
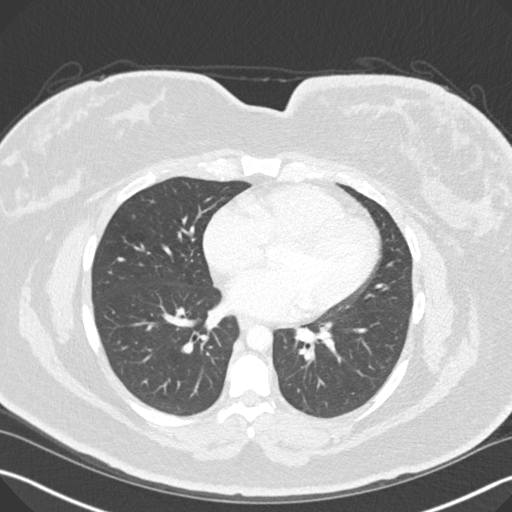
[im 81/151  lung]
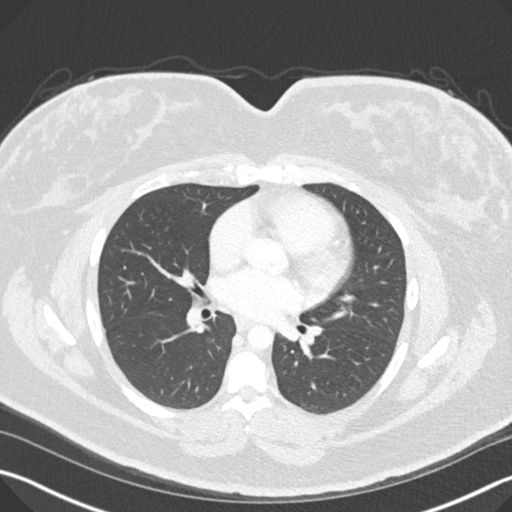
[im 93/151  lung]
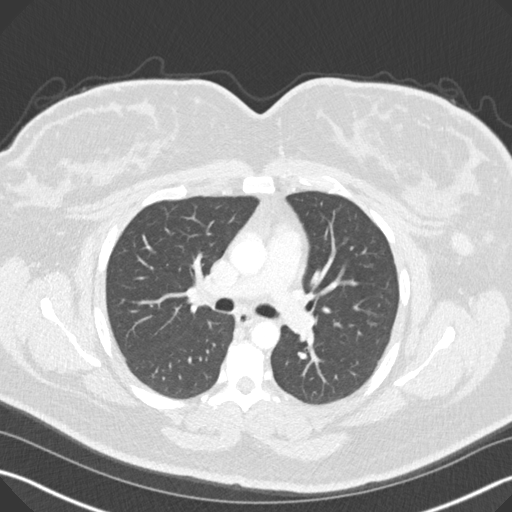
[im 104/151  mediastinal]
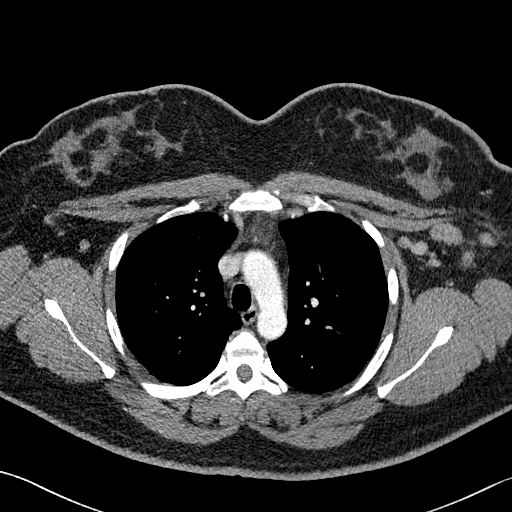
[im 104/151  lung]
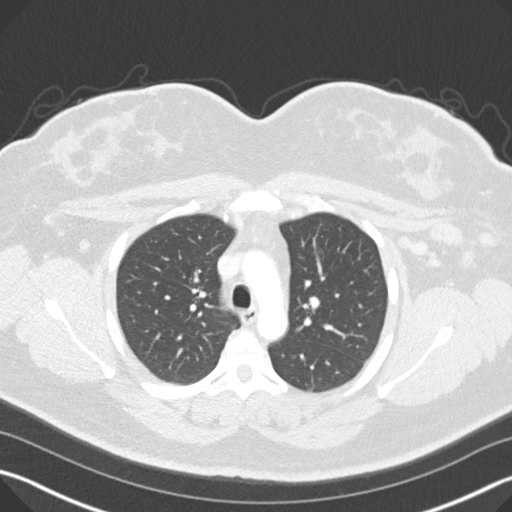
[im 116/151  lung]
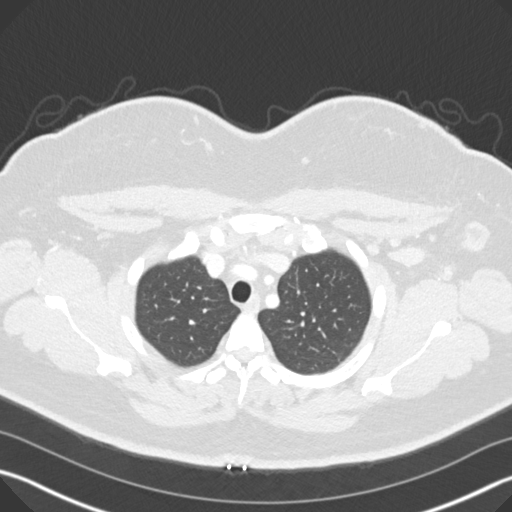
[im 127/151  lung]
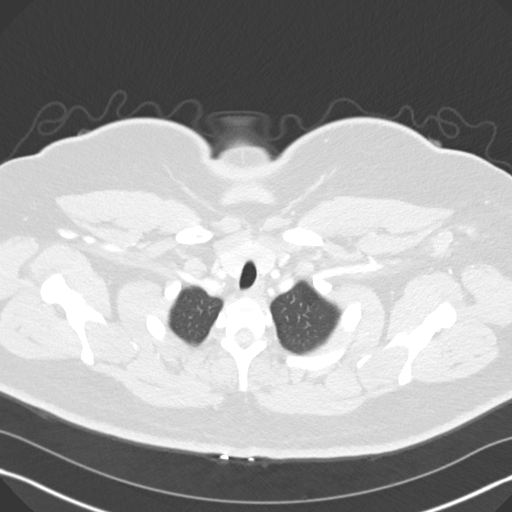
[im 139/151  lung]
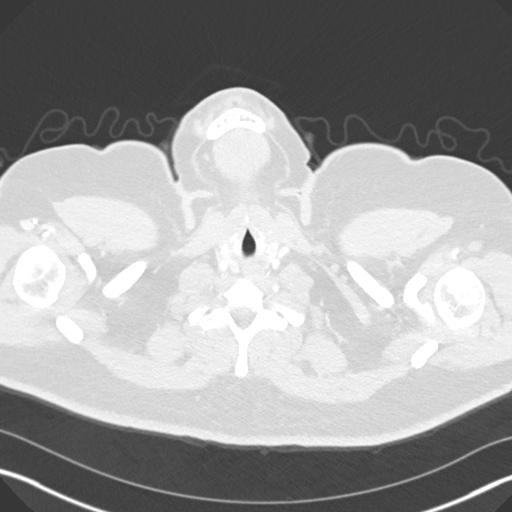

[Series 6: coronal · coronal · 0.74mm/px · 3 of 170 slices shown]
[im 34/170  lung]
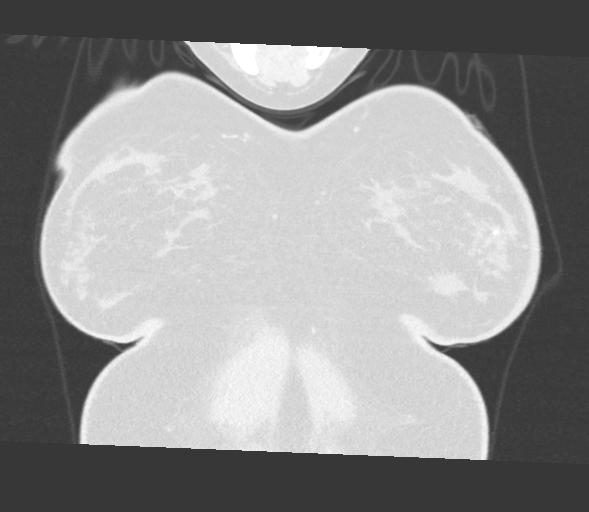
[im 68/170  lung]
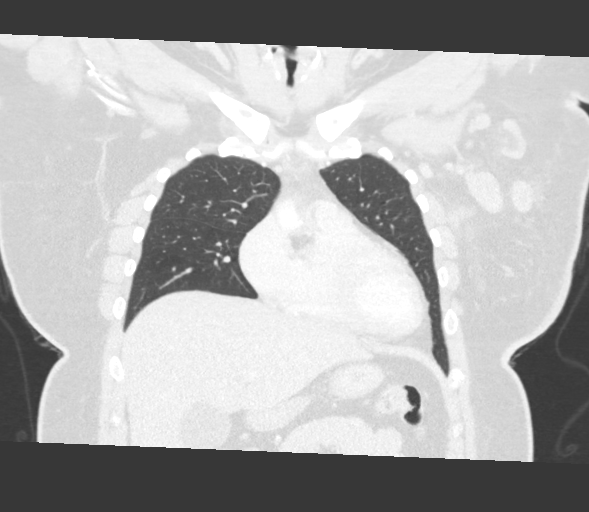
[im 102/170  lung]
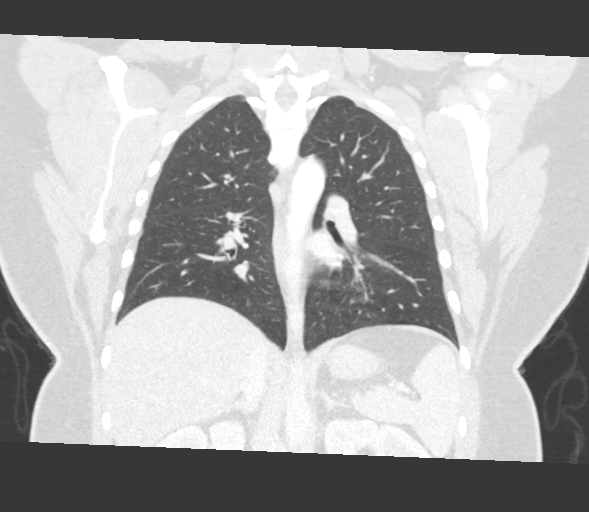

[15 of 36 positions shown; findings below may reference images not displayed]

FINDINGS: Cardiovascular: The heart size appears within normal limits. No
pericardial effusion.

Mediastinum/Nodes: Normal appearance of the thyroid gland. The
trachea appears patent and is midline. Normal appearance of the
esophagus.

No enlarged supraclavicular, right axillary, mediastinal, or hilar
lymph nodes.

Multiple enlarged left axillary lymph nodes are identified. The
largest measures 2.2 cm short axis, image 43/2.

Lungs/Pleura: Lungs are clear. No pleural effusion or pneumothorax.
No suspicious lung nodules.

Upper Abdomen: No acute abnormality.

Musculoskeletal: Within the T11 vertebral body there are 2 lucent
bone lesions which are nonspecific measuring up to 7 mm. Within the
distal body of sternum there is a small lucent bone lesion which
measures 6 mm, image 114/7. On the MRI images obtained [DATE]
there may be a small area of corresponding enhancement noted. A
second area within the proximal third of the body of sternum is more
subtle in appearance, image 118/7. There is also a focal area of
enhancement on the MR images.
IMPRESSION: 1. Enlarged left axillary lymph nodes compatible with metastatic
adenopathy.
2. Several lucent lesions are identified within the lower thoracic
spine and sternum. In retrospect, on the MRI of the breast from
[DATE] there are 2 areas of enhancement within the sternum (best
seen on the postcontrast subtraction images) which correspond to the
lucent lesions on today's study and are suspicious for bone
metastasis. Thoracic spine MRI may be helpful to confirm metastatic
disease within the T11 lucent lesion.

## 2020-10-13 IMAGING — NM NM BONE WHOLE BODY
2 series · 2 of 2 positions shown · non-contrast
Comparison: CT of the chest [DATE]

CLINICAL DATA: Breast cancer staging. Right-sided low back pain for
several years.

EXAM:
NUCLEAR MEDICINE WHOLE BODY BONE SCAN
TECHNIQUE: Whole body anterior and posterior images were obtained approximately
3 hours after intravenous injection of radiopharmaceutical.
RADIOPHARMACEUTICALS:  20.0 mCi [SA] MDP IV

[Series 1: wbr_bone_40 whole body · 2.66mm/px · 1 of 1 slices shown (1 of 2)]
[im 1/1]
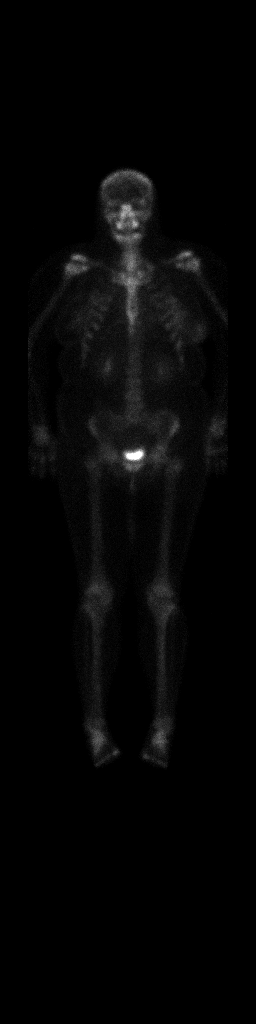

[Series 1: wbr_bone_40 whole body · 2.66mm/px · 1 of 1 slices shown (2 of 2)]
[im 1/1]
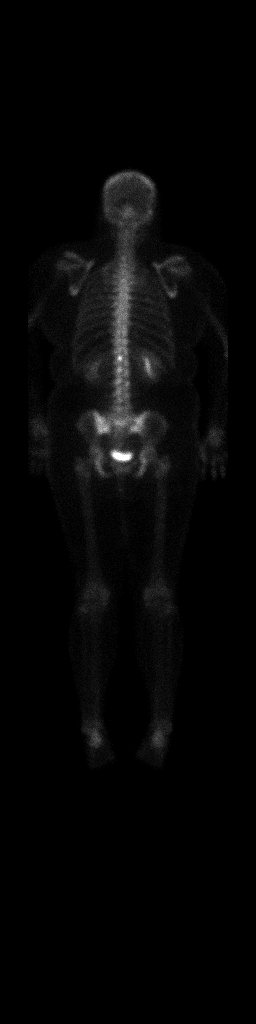

[2 of 2 positions shown; findings below may reference images not displayed]

FINDINGS: There is a focus of uptake posteriorly either in the lower thoracic
spine or the upper lumbar spine. Lucent lesions are seen in T11 on
the previous CT scan. The uptake on today's study appears to be a
lower than the level of these lucent lesions. Lucent lesions were
seen in the sternum as well with no correlate on today's study. No
other discrete bony lesions are identified. Increased uptake in the
left breast consistent with the patient's known malignancy.
IMPRESSION: 1. Lucent lesions were seen in the sternum and T11 on the CT scan
from today. Purely lucent metastases are often not well visualized
on bone scans. An MRI was recommended and would be much more
sensitive.
2. Uptake near midline of the lower thoracic or upper lumbar
vertebral body. The finding is nonspecific but at least somewhat
concerning for a metastasis given history and CT of the chest
findings. Extending the recommended MRI of the thoracic spine
through L1 would be helpful to evaluate this region.
3. No other scintigraphic evidence of bony metastatic disease.
4. Increased uptake in the left breast consistent with known
malignancy.

## 2020-10-13 MED ORDER — SODIUM CHLORIDE (PF) 0.9 % IJ SOLN
INTRAMUSCULAR | Status: AC
  Filled 2020-10-13: qty 50

## 2020-10-13 MED ORDER — TECHNETIUM TC 99M MEDRONATE IV KIT
20.0000 | PACK | Freq: Once | INTRAVENOUS | Status: AC
  Administered 2020-10-13: 20 via INTRAVENOUS

## 2020-10-13 MED ORDER — IOHEXOL 300 MG/ML  SOLN
75.0000 mL | Freq: Once | INTRAMUSCULAR | Status: AC | PRN
  Administered 2020-10-13: 60 mL via INTRAVENOUS

## 2020-10-13 NOTE — Progress Notes (Signed)
  Echocardiogram 2D Echocardiogram has been performed.  Gabriela Sullivan Gabriela Sullivan 10/13/2020, 8:49 AM

## 2020-10-16 ENCOUNTER — Encounter (HOSPITAL_BASED_OUTPATIENT_CLINIC_OR_DEPARTMENT_OTHER)
Admission: RE | Admit: 2020-10-16 | Discharge: 2020-10-16 | Disposition: A | Discharge status: Home / Self Care | Payer: Self-pay | Source: Ambulatory Visit | Attending: Surgery | Admitting: Surgery

## 2020-10-16 ENCOUNTER — Encounter: Payer: Self-pay | Admitting: *Deleted

## 2020-10-16 ENCOUNTER — Other Ambulatory Visit: Payer: Self-pay | Admitting: Oncology

## 2020-10-16 ENCOUNTER — Other Ambulatory Visit (HOSPITAL_COMMUNITY): Payer: No Typology Code available for payment source

## 2020-10-16 DIAGNOSIS — C773 Secondary and unspecified malignant neoplasm of axilla and upper limb lymph nodes: Secondary | ICD-10-CM | POA: Diagnosis not present

## 2020-10-16 DIAGNOSIS — Z6839 Body mass index (BMI) 39.0-39.9, adult: Secondary | ICD-10-CM | POA: Diagnosis not present

## 2020-10-16 DIAGNOSIS — Z8042 Family history of malignant neoplasm of prostate: Secondary | ICD-10-CM | POA: Diagnosis not present

## 2020-10-16 DIAGNOSIS — Z803 Family history of malignant neoplasm of breast: Secondary | ICD-10-CM | POA: Diagnosis not present

## 2020-10-16 DIAGNOSIS — E669 Obesity, unspecified: Secondary | ICD-10-CM | POA: Diagnosis not present

## 2020-10-16 DIAGNOSIS — M899 Disorder of bone, unspecified: Secondary | ICD-10-CM

## 2020-10-16 DIAGNOSIS — C50812 Malignant neoplasm of overlapping sites of left female breast: Secondary | ICD-10-CM | POA: Diagnosis not present

## 2020-10-16 DIAGNOSIS — Z170 Estrogen receptor positive status [ER+]: Secondary | ICD-10-CM | POA: Diagnosis not present

## 2020-10-16 DIAGNOSIS — Z8041 Family history of malignant neoplasm of ovary: Secondary | ICD-10-CM | POA: Diagnosis not present

## 2020-10-16 DIAGNOSIS — C50412 Malignant neoplasm of upper-outer quadrant of left female breast: Secondary | ICD-10-CM

## 2020-10-16 DIAGNOSIS — Z452 Encounter for adjustment and management of vascular access device: Secondary | ICD-10-CM | POA: Diagnosis present

## 2020-10-16 DIAGNOSIS — M898X9 Other specified disorders of bone, unspecified site: Secondary | ICD-10-CM

## 2020-10-16 LAB — CBC WITH DIFFERENTIAL/PLATELET
Abs Immature Granulocytes: 0.02 10*3/uL (ref 0.00–0.07)
Basophils Absolute: 0 10*3/uL (ref 0.0–0.1)
Basophils Relative: 0 %
Eosinophils Absolute: 0.2 10*3/uL (ref 0.0–0.5)
Eosinophils Relative: 3 %
HCT: 41.1 % (ref 36.0–46.0)
Hemoglobin: 14.2 g/dL (ref 12.0–15.0)
Immature Granulocytes: 0 %
Lymphocytes Relative: 40 %
Lymphs Abs: 2.7 10*3/uL (ref 0.7–4.0)
MCH: 28.5 pg (ref 26.0–34.0)
MCHC: 34.5 g/dL (ref 30.0–36.0)
MCV: 82.4 fL (ref 80.0–100.0)
Monocytes Absolute: 0.6 10*3/uL (ref 0.1–1.0)
Monocytes Relative: 8 %
Neutro Abs: 3.3 10*3/uL (ref 1.7–7.7)
Neutrophils Relative %: 49 %
Platelets: 276 10*3/uL (ref 150–400)
RBC: 4.99 MIL/uL (ref 3.87–5.11)
RDW: 12.8 % (ref 11.5–15.5)
WBC: 6.8 10*3/uL (ref 4.0–10.5)
nRBC: 0 % (ref 0.0–0.2)

## 2020-10-16 LAB — COMPREHENSIVE METABOLIC PANEL
ALT: 78 U/L — ABNORMAL HIGH (ref 0–44)
AST: 43 U/L — ABNORMAL HIGH (ref 15–41)
Albumin: 3.9 g/dL (ref 3.5–5.0)
Alkaline Phosphatase: 59 U/L (ref 38–126)
Anion gap: 7 (ref 5–15)
BUN: 13 mg/dL (ref 6–20)
CO2: 26 mmol/L (ref 22–32)
Calcium: 8.7 mg/dL — ABNORMAL LOW (ref 8.9–10.3)
Chloride: 105 mmol/L (ref 98–111)
Creatinine, Ser: 0.47 mg/dL (ref 0.44–1.00)
GFR, Estimated: >60 mL/min (ref >60)
Glucose, Bld: 125 mg/dL — ABNORMAL HIGH (ref 70–99)
Potassium: 3.8 mmol/L (ref 3.5–5.1)
Sodium: 138 mmol/L (ref 135–145)
Total Bilirubin: 0.7 mg/dL (ref 0.3–1.2)
Total Protein: 6.8 g/dL (ref 6.5–8.1)

## 2020-10-16 MED ORDER — CHLORHEXIDINE GLUCONATE CLOTH 2 % EX PADS
6.0000 | MEDICATED_PAD | Freq: Once | CUTANEOUS | Status: DC
Start: 2020-10-16 — End: 2020-10-18

## 2020-10-16 MED ORDER — CHLORHEXIDINE GLUCONATE CLOTH 2 % EX PADS: 6.0000 | MEDICATED_PAD | Freq: Once | CUTANEOUS | Status: DC

## 2020-10-16 NOTE — Progress Notes (Addendum)
Ensure presurgery drink given with instruction to complete by 0430. CHG soap given with instruction. Pt verbalized understanding        Enhanced Recovery after Surgery for Orthopedics Enhanced Recovery after Surgery is a protocol used to improve the stress on your body and your recovery after surgery.  Patient Instructions  . The night before surgery:  o No food after midnight. ONLY clear liquids after midnight  . The day of surgery (if you do NOT have diabetes):  o Drink ONE (1) Pre-Surgery Clear Ensure as directed.   o This drink was given to you during your hospital  pre-op appointment visit. o The pre-op nurse will instruct you on the time to drink the  Pre-Surgery Ensure depending on your surgery time. o Finish the drink at the designated time by the pre-op nurse.  o Nothing else to drink after completing the  Pre-Surgery Clear Ensure.  . The day of surgery (if you have diabetes): o Drink ONE (1) Gatorade 2 (G2) as directed. o This drink was given to you during your hospital  pre-op appointment visit.  o The pre-op nurse will instruct you on the time to drink the   Gatorade 2 (G2) depending on your surgery time. o Color of the Gatorade may vary. Red is not allowed. o Nothing else to drink after completing the  Gatorade 2 (G2).         If you have questions, please contact your surgeon's office.  Surgical soap given to pt and explained how to use it.  Pt verbalized understandings.

## 2020-10-16 NOTE — Progress Notes (Signed)
I called Gabriela Sullivan and gave her the results of her bone scan and CT scans.  We discussed the difference between calluses and lytic lesions.  We need to get a total spinal MRI.  She is agreeable.  She tells me she is not claustrophobic.  The order has been placed

## 2020-10-17 ENCOUNTER — Encounter: Payer: Self-pay | Admitting: *Deleted

## 2020-10-17 NOTE — Anesthesia Preprocedure Evaluation (Addendum)
Anesthesia Evaluation  Patient identified by MRN, date of birth, ID band Patient awake    Reviewed: Allergy & Precautions, NPO status , Patient's Chart, lab work & pertinent test results  Airway Mallampati: III  TM Distance: >3 FB Neck ROM: Full    Dental no notable dental hx. (+) Teeth Intact, Dental Advisory Given   Pulmonary asthma (well controlled, does not have rescue inhaler) ,  Snores at night, has never had sleep study    Pulmonary exam normal breath sounds clear to auscultation       Cardiovascular negative cardio ROS Normal cardiovascular exam Rhythm:Regular Rate:Normal  Echo 10/13/20: 1. Left ventricular ejection fraction, by estimation, is 60 to 65%. The  left ventricle has normal function. The left ventricle has no regional  wall motion abnormalities. Left ventricular diastolic parameters are  normal. The average left ventricular  global longitudinal strain is -18.6 %. The global longitudinal strain is  low normal.  2. Right ventricular systolic function is normal. The right ventricular  size is normal.  3. The mitral valve is abnormal. Trivial mitral valve regurgitation.  4. The aortic valve is tricuspid. Aortic valve regurgitation is not  visualized.  5. The inferior vena cava is normal in size with greater than 50%  respiratory variability, suggesting right atrial pressure of 3 mmHg.    Neuro/Psych  Headaches, negative psych ROS   GI/Hepatic negative GI ROS, Neg liver ROS,   Endo/Other  Obesity BMI 39  Renal/GU negative Renal ROS  Female GU complaint (PCOS)     Musculoskeletal negative musculoskeletal ROS (+)   Abdominal (+) + obese,   Peds  Hematology negative hematology ROS (+)   Anesthesia Other Findings Breast ca  Reproductive/Obstetrics negative OB ROS Urine preg negative today                             Anesthesia Physical Anesthesia Plan  ASA:  II  Anesthesia Plan: General   Post-op Pain Management:    Induction: Intravenous  PONV Risk Score and Plan: 3 and Ondansetron, Dexamethasone, Midazolam, Treatment may vary due to age or medical condition and Scopolamine patch - Pre-op  Airway Management Planned: LMA  Additional Equipment: None  Intra-op Plan:   Post-operative Plan: Extubation in OR  Informed Consent: I have reviewed the patients History and Physical, chart, labs and discussed the procedure including the risks, benefits and alternatives for the proposed anesthesia with the patient or authorized representative who has indicated his/her understanding and acceptance.     Dental advisory given  Plan Discussed with: CRNA  Anesthesia Plan Comments:        Anesthesia Quick Evaluation

## 2020-10-18 ENCOUNTER — Encounter (HOSPITAL_BASED_OUTPATIENT_CLINIC_OR_DEPARTMENT_OTHER)
Admission: RE | Disposition: A | Discharge status: Home / Self Care | Payer: Self-pay | Source: Home / Self Care | Attending: Surgery

## 2020-10-18 ENCOUNTER — Encounter (HOSPITAL_BASED_OUTPATIENT_CLINIC_OR_DEPARTMENT_OTHER): Payer: Self-pay | Admitting: Surgery

## 2020-10-18 ENCOUNTER — Other Ambulatory Visit: Payer: Self-pay

## 2020-10-18 ENCOUNTER — Ambulatory Visit (HOSPITAL_COMMUNITY): Payer: Medicaid Other

## 2020-10-18 ENCOUNTER — Ambulatory Visit (HOSPITAL_BASED_OUTPATIENT_CLINIC_OR_DEPARTMENT_OTHER)
Admission: RE | Admit: 2020-10-18 | Discharge: 2020-10-18 | Disposition: A | Discharge status: Home / Self Care | Payer: Medicaid Other | Attending: Surgery | Admitting: Surgery

## 2020-10-18 ENCOUNTER — Ambulatory Visit (HOSPITAL_BASED_OUTPATIENT_CLINIC_OR_DEPARTMENT_OTHER): Payer: Medicaid Other | Admitting: Anesthesiology

## 2020-10-18 DIAGNOSIS — C773 Secondary and unspecified malignant neoplasm of axilla and upper limb lymph nodes: Secondary | ICD-10-CM | POA: Diagnosis not present

## 2020-10-18 DIAGNOSIS — Z170 Estrogen receptor positive status [ER+]: Secondary | ICD-10-CM | POA: Insufficient documentation

## 2020-10-18 DIAGNOSIS — Z803 Family history of malignant neoplasm of breast: Secondary | ICD-10-CM | POA: Insufficient documentation

## 2020-10-18 DIAGNOSIS — Z452 Encounter for adjustment and management of vascular access device: Secondary | ICD-10-CM | POA: Insufficient documentation

## 2020-10-18 DIAGNOSIS — Z8041 Family history of malignant neoplasm of ovary: Secondary | ICD-10-CM | POA: Insufficient documentation

## 2020-10-18 DIAGNOSIS — Z95828 Presence of other vascular implants and grafts: Secondary | ICD-10-CM

## 2020-10-18 DIAGNOSIS — C50812 Malignant neoplasm of overlapping sites of left female breast: Secondary | ICD-10-CM | POA: Diagnosis not present

## 2020-10-18 DIAGNOSIS — E669 Obesity, unspecified: Secondary | ICD-10-CM | POA: Insufficient documentation

## 2020-10-18 DIAGNOSIS — Z419 Encounter for procedure for purposes other than remedying health state, unspecified: Secondary | ICD-10-CM

## 2020-10-18 DIAGNOSIS — Z6839 Body mass index (BMI) 39.0-39.9, adult: Secondary | ICD-10-CM | POA: Insufficient documentation

## 2020-10-18 DIAGNOSIS — Z8042 Family history of malignant neoplasm of prostate: Secondary | ICD-10-CM | POA: Insufficient documentation

## 2020-10-18 HISTORY — PX: PORTACATH PLACEMENT: SHX2246

## 2020-10-18 LAB — POCT PREGNANCY, URINE: Preg Test, Ur: NEGATIVE

## 2020-10-18 IMAGING — CR DG CHEST 1V PORT
1 series · 1 of 1 positions shown · non-contrast
Comparison: Chest CT [DATE].

CLINICAL DATA: 29-year-old female Port-A-Cath placement. Left
breast cancer.

EXAM:
PORTABLE CHEST 1 VIEW

[chest ap]
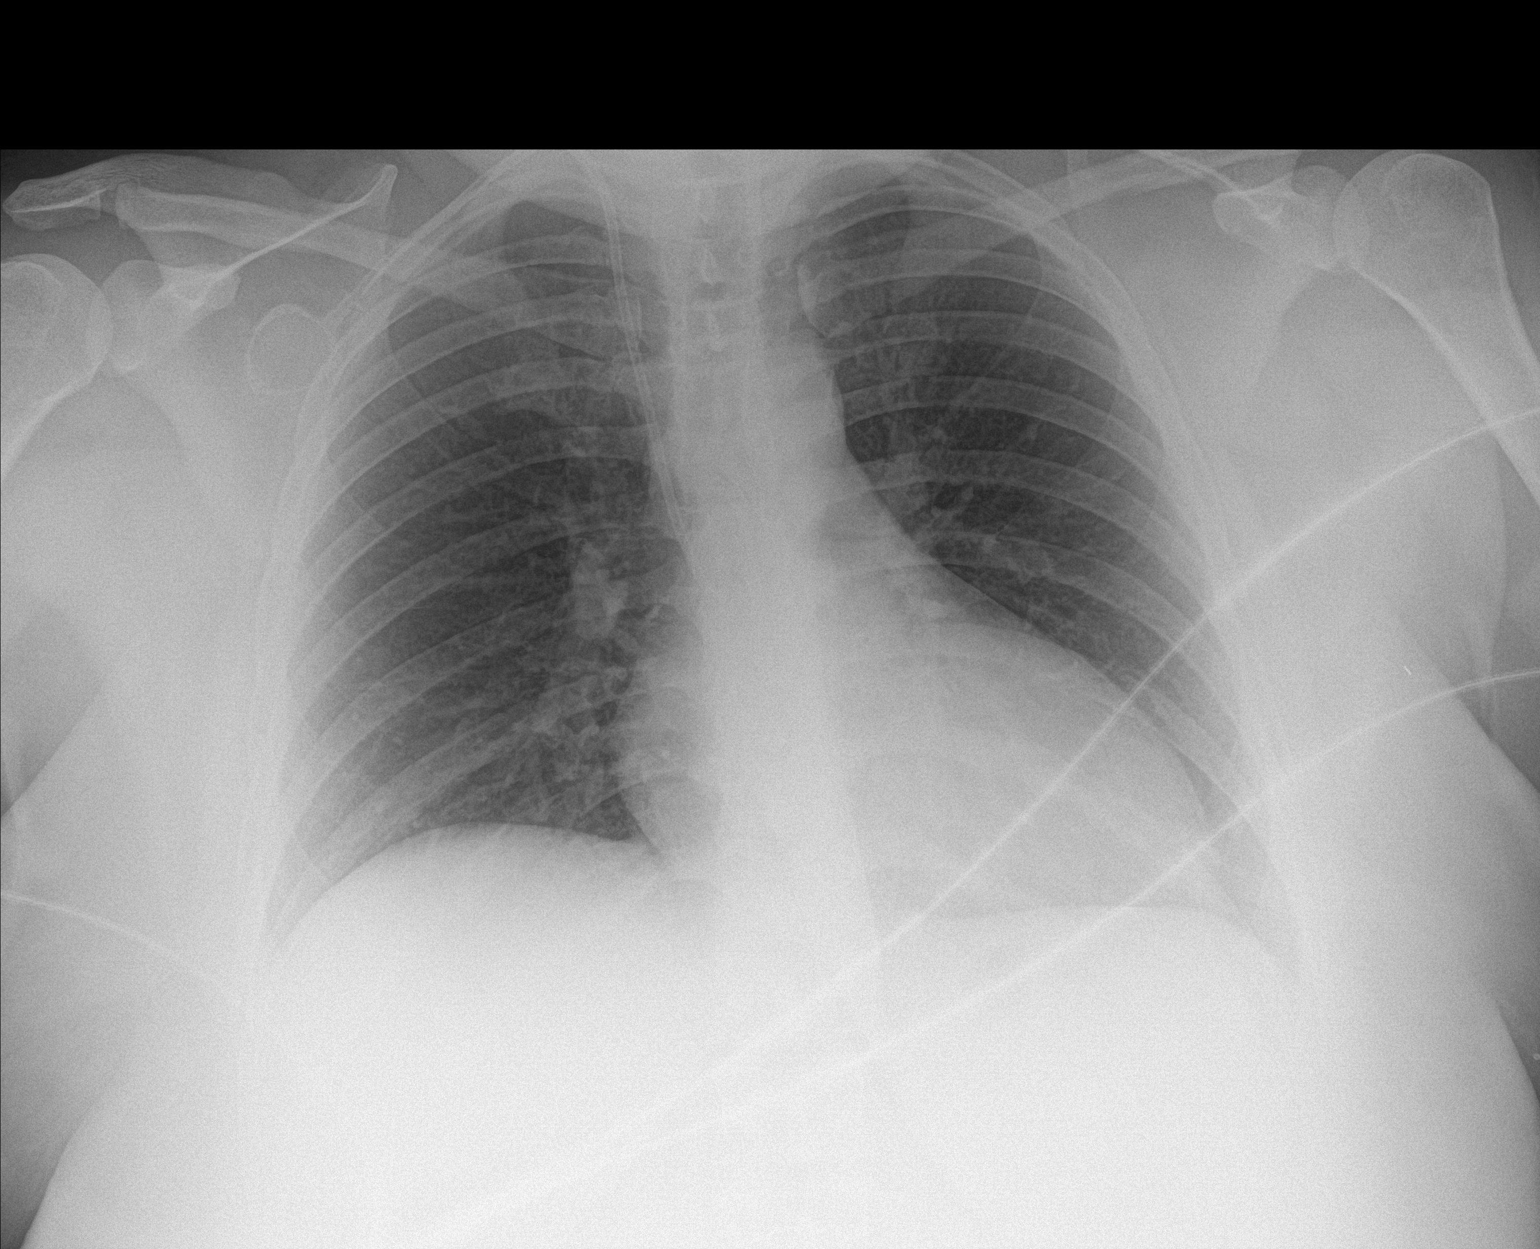

[1 of 1 positions shown; findings below may reference images not displayed]

FINDINGS: Portable AP upright view at [7Z] hours. Right chest IJ approach
Port-A-Cath in place. Catheter tip projects at the lower SVC level
just below the carina. No pneumothorax. Lung volumes and mediastinal
contours are normal. Allowing for portable technique the lungs are
clear. Stable visualized osseous structures.
IMPRESSION: Right chest Port-A-Cath placed with no adverse features.

## 2020-10-18 SURGERY — INSERTION, TUNNELED CENTRAL VENOUS DEVICE, WITH PORT
Anesthesia: General | Site: Chest | Laterality: Right

## 2020-10-18 MED ORDER — KETOROLAC TROMETHAMINE 30 MG/ML IJ SOLN: 30.0000 mg | Freq: Once | INTRAMUSCULAR | Status: DC | PRN

## 2020-10-18 MED ORDER — OXYCODONE HCL 5 MG/5ML PO SOLN: 5.0000 mg | Freq: Once | ORAL | Status: AC | PRN

## 2020-10-18 MED ORDER — OXYCODONE HCL 5 MG PO TABS: 5.0000 mg | ORAL_TABLET | Freq: Four times a day (QID) | ORAL | 0 refills | Status: DC | PRN

## 2020-10-18 MED ORDER — OXYCODONE HCL 5 MG PO TABS
5.0000 mg | ORAL_TABLET | Freq: Once | ORAL | Status: AC | PRN
  Administered 2020-10-18: 5 mg via ORAL

## 2020-10-18 MED ORDER — FENTANYL CITRATE (PF) 100 MCG/2ML IJ SOLN
INTRAMUSCULAR | Status: DC | PRN
  Administered 2020-10-18: 25 ug via INTRAVENOUS
  Administered 2020-10-18: 50 ug via INTRAVENOUS
  Administered 2020-10-18: 25 ug via INTRAVENOUS

## 2020-10-18 MED ORDER — LIDOCAINE HCL (CARDIAC) PF 100 MG/5ML IV SOSY
PREFILLED_SYRINGE | INTRAVENOUS | Status: DC | PRN
  Administered 2020-10-18: 60 mg via INTRAVENOUS

## 2020-10-18 MED ORDER — SCOPOLAMINE 1 MG/3DAYS TD PT72
1.0000 | MEDICATED_PATCH | TRANSDERMAL | Status: DC
  Administered 2020-10-18: 1.5 mg via TRANSDERMAL

## 2020-10-18 MED ORDER — IBUPROFEN 800 MG PO TABS: 800.0000 mg | ORAL_TABLET | Freq: Three times a day (TID) | ORAL | 0 refills | Status: DC | PRN

## 2020-10-18 MED ORDER — ACETAMINOPHEN 500 MG PO TABS
1000.0000 mg | ORAL_TABLET | Freq: Once | ORAL | Status: AC
  Administered 2020-10-18: 1000 mg via ORAL

## 2020-10-18 MED ORDER — MIDAZOLAM HCL 2 MG/2ML IJ SOLN
INTRAMUSCULAR | Status: AC
  Filled 2020-10-18: qty 2

## 2020-10-18 MED ORDER — HEPARIN SOD (PORK) LOCK FLUSH 100 UNIT/ML IV SOLN
INTRAVENOUS | Status: DC | PRN
  Administered 2020-10-18: 500 [IU]

## 2020-10-18 MED ORDER — ONDANSETRON HCL 4 MG/2ML IJ SOLN
INTRAMUSCULAR | Status: DC | PRN
  Administered 2020-10-18: 4 mg via INTRAVENOUS

## 2020-10-18 MED ORDER — PROPOFOL 10 MG/ML IV BOLUS
INTRAVENOUS | Status: AC
  Filled 2020-10-18: qty 20

## 2020-10-18 MED ORDER — MIDAZOLAM HCL 5 MG/5ML IJ SOLN
INTRAMUSCULAR | Status: DC | PRN
  Administered 2020-10-18: 2 mg via INTRAVENOUS

## 2020-10-18 MED ORDER — EPHEDRINE SULFATE 50 MG/ML IJ SOLN
INTRAMUSCULAR | Status: DC | PRN
  Administered 2020-10-18 (×2): 10 mg via INTRAVENOUS

## 2020-10-18 MED ORDER — DEXAMETHASONE SODIUM PHOSPHATE 10 MG/ML IJ SOLN
INTRAMUSCULAR | Status: AC
  Filled 2020-10-18: qty 1

## 2020-10-18 MED ORDER — CEFAZOLIN IN SODIUM CHLORIDE 3-0.9 GM/100ML-% IV SOLN
INTRAVENOUS | Status: AC
  Filled 2020-10-18: qty 100

## 2020-10-18 MED ORDER — FENTANYL CITRATE (PF) 100 MCG/2ML IJ SOLN
INTRAMUSCULAR | Status: AC
  Filled 2020-10-18: qty 2

## 2020-10-18 MED ORDER — DEXAMETHASONE SODIUM PHOSPHATE 4 MG/ML IJ SOLN
INTRAMUSCULAR | Status: DC | PRN
  Administered 2020-10-18: 5 mg via INTRAVENOUS

## 2020-10-18 MED ORDER — CEFAZOLIN SODIUM-DEXTROSE 2-4 GM/100ML-% IV SOLN
2.0000 g | Freq: Once | INTRAVENOUS | Status: AC
  Administered 2020-10-18: 2 g via INTRAVENOUS

## 2020-10-18 MED ORDER — SCOPOLAMINE 1 MG/3DAYS TD PT72
MEDICATED_PATCH | TRANSDERMAL | Status: AC
  Filled 2020-10-18: qty 1

## 2020-10-18 MED ORDER — BUPIVACAINE-EPINEPHRINE 0.25% -1:200000 IJ SOLN
INTRAMUSCULAR | Status: DC | PRN
  Administered 2020-10-18: 12 mL

## 2020-10-18 MED ORDER — PHENYLEPHRINE 40 MCG/ML (10ML) SYRINGE FOR IV PUSH (FOR BLOOD PRESSURE SUPPORT)
PREFILLED_SYRINGE | INTRAVENOUS | Status: AC
  Filled 2020-10-18: qty 10

## 2020-10-18 MED ORDER — PHENYLEPHRINE HCL (PRESSORS) 10 MG/ML IV SOLN
INTRAVENOUS | Status: DC | PRN
  Administered 2020-10-18: 80 ug via INTRAVENOUS
  Administered 2020-10-18 (×2): 40 ug via INTRAVENOUS
  Administered 2020-10-18: 80 ug via INTRAVENOUS
  Administered 2020-10-18: 40 ug via INTRAVENOUS

## 2020-10-18 MED ORDER — LIDOCAINE 2% (20 MG/ML) 5 ML SYRINGE
INTRAMUSCULAR | Status: AC
  Filled 2020-10-18: qty 5

## 2020-10-18 MED ORDER — LACTATED RINGERS IV SOLN: INTRAVENOUS | Status: DC

## 2020-10-18 MED ORDER — ONDANSETRON HCL 4 MG/2ML IJ SOLN
INTRAMUSCULAR | Status: AC
  Filled 2020-10-18: qty 2

## 2020-10-18 MED ORDER — OXYCODONE HCL 5 MG PO TABS
ORAL_TABLET | ORAL | Status: AC
  Filled 2020-10-18: qty 1

## 2020-10-18 MED ORDER — MEPERIDINE HCL 25 MG/ML IJ SOLN: 6.2500 mg | INTRAMUSCULAR | Status: DC | PRN

## 2020-10-18 MED ORDER — HEPARIN (PORCINE) IN NACL 2-0.9 UNITS/ML
INTRAMUSCULAR | Status: AC | PRN
  Administered 2020-10-18: 1

## 2020-10-18 MED ORDER — CEFAZOLIN SODIUM-DEXTROSE 2-4 GM/100ML-% IV SOLN
INTRAVENOUS | Status: AC
  Filled 2020-10-18: qty 100

## 2020-10-18 MED ORDER — PROMETHAZINE HCL 25 MG/ML IJ SOLN: 6.2500 mg | INTRAMUSCULAR | Status: DC | PRN

## 2020-10-18 MED ORDER — CEFAZOLIN IN SODIUM CHLORIDE 3-0.9 GM/100ML-% IV SOLN: 3.0000 g | INTRAVENOUS | Status: DC

## 2020-10-18 MED ORDER — HYDROMORPHONE HCL 1 MG/ML IJ SOLN: 0.2500 mg | INTRAMUSCULAR | Status: DC | PRN

## 2020-10-18 MED ORDER — PROPOFOL 10 MG/ML IV BOLUS
INTRAVENOUS | Status: DC | PRN
  Administered 2020-10-18: 200 mg via INTRAVENOUS

## 2020-10-18 MED ORDER — ACETAMINOPHEN 500 MG PO TABS: 1000.0000 mg | ORAL_TABLET | ORAL | Status: DC

## 2020-10-18 MED ORDER — ACETAMINOPHEN 500 MG PO TABS
ORAL_TABLET | ORAL | Status: AC
  Filled 2020-10-18: qty 2

## 2020-10-18 SURGICAL SUPPLY — 61 items
ADH SKN CLS APL DERMABOND .7 (GAUZE/BANDAGES/DRESSINGS) ×1
APL PRP STRL LF DISP 70% ISPRP (MISCELLANEOUS) ×1
APL SKNCLS STERI-STRIP NONHPOA (GAUZE/BANDAGES/DRESSINGS)
BAG DECANTER FOR FLEXI CONT (MISCELLANEOUS) ×2 IMPLANT
BENZOIN TINCTURE PRP APPL 2/3 (GAUZE/BANDAGES/DRESSINGS) IMPLANT
BLADE HEX COATED 2.75 (ELECTRODE) ×2 IMPLANT
BLADE SURG 11 STRL SS (BLADE) ×2 IMPLANT
BLADE SURG 15 STRL LF DISP TIS (BLADE) ×1 IMPLANT
BLADE SURG 15 STRL SS (BLADE) ×2
CANISTER SUCT 1200ML W/VALVE (MISCELLANEOUS) IMPLANT
CHLORAPREP W/TINT 26 (MISCELLANEOUS) ×2 IMPLANT
COVER BACK TABLE 60X90IN (DRAPES) ×2 IMPLANT
COVER MAYO STAND STRL (DRAPES) ×2 IMPLANT
COVER PROBE 5X48 (MISCELLANEOUS) ×2
COVER WAND RF STERILE (DRAPES) IMPLANT
DECANTER SPIKE VIAL GLASS SM (MISCELLANEOUS) IMPLANT
DERMABOND ADVANCED (GAUZE/BANDAGES/DRESSINGS) ×1
DERMABOND ADVANCED .7 DNX12 (GAUZE/BANDAGES/DRESSINGS) ×1 IMPLANT
DRAPE C-ARM 42X72 X-RAY (DRAPES) ×2 IMPLANT
DRAPE LAPAROSCOPIC ABDOMINAL (DRAPES) ×2 IMPLANT
DRAPE UTILITY XL STRL (DRAPES) ×2 IMPLANT
DRSG TEGADERM 2-3/8X2-3/4 SM (GAUZE/BANDAGES/DRESSINGS) IMPLANT
DRSG TEGADERM 4X4.75 (GAUZE/BANDAGES/DRESSINGS) IMPLANT
ELECT REM PT RETURN 9FT ADLT (ELECTROSURGICAL) ×2
ELECTRODE REM PT RTRN 9FT ADLT (ELECTROSURGICAL) ×1 IMPLANT
GAUZE SPONGE 4X4 12PLY STRL LF (GAUZE/BANDAGES/DRESSINGS) IMPLANT
GLOVE SRG 8 PF TXTR STRL LF DI (GLOVE) ×1 IMPLANT
GLOVE SURG LTX SZ8 (GLOVE) ×2 IMPLANT
GLOVE SURG POLYISO LF SZ6.5 (GLOVE) ×2 IMPLANT
GLOVE SURG UNDER POLY LF SZ7 (GLOVE) ×2 IMPLANT
GLOVE SURG UNDER POLY LF SZ8 (GLOVE) ×2
GOWN STRL REUS W/ TWL LRG LVL3 (GOWN DISPOSABLE) ×2 IMPLANT
GOWN STRL REUS W/ TWL XL LVL3 (GOWN DISPOSABLE) ×1 IMPLANT
GOWN STRL REUS W/TWL LRG LVL3 (GOWN DISPOSABLE) ×4
GOWN STRL REUS W/TWL XL LVL3 (GOWN DISPOSABLE) ×2
IV KIT MINILOC 20X1 SAFETY (NEEDLE) IMPLANT
KIT CVR 48X5XPRB PLUP LF (MISCELLANEOUS) ×1 IMPLANT
KIT PORT POWER 8FR ISP CVUE (Port) ×2 IMPLANT
NDL SAFETY ECLIPSE 18X1.5 (NEEDLE) IMPLANT
NEEDLE HYPO 18GX1.5 SHARP (NEEDLE)
NEEDLE HYPO 22GX1.5 SAFETY (NEEDLE) IMPLANT
NEEDLE HYPO 25X1 1.5 SAFETY (NEEDLE) ×2 IMPLANT
NEEDLE SPNL 22GX3.5 QUINCKE BK (NEEDLE) IMPLANT
PACK BASIN DAY SURGERY FS (CUSTOM PROCEDURE TRAY) ×2 IMPLANT
PENCIL SMOKE EVACUATOR (MISCELLANEOUS) ×2 IMPLANT
SET SHEATH INTRODUCER 10FR (MISCELLANEOUS) IMPLANT
SHEATH COOK PEEL AWAY SET 9F (SHEATH) IMPLANT
SLEEVE SCD COMPRESS KNEE MED (STOCKING) ×2 IMPLANT
SPONGE LAP 4X18 RFD (DISPOSABLE) IMPLANT
STRIP CLOSURE SKIN 1/2X4 (GAUZE/BANDAGES/DRESSINGS) IMPLANT
SUT MON AB 4-0 PC3 18 (SUTURE) ×2 IMPLANT
SUT PROLENE 2 0 CT2 30 (SUTURE) IMPLANT
SUT PROLENE 2 0 SH DA (SUTURE) ×2 IMPLANT
SUT SILK 2 0 TIES 17X18 (SUTURE)
SUT SILK 2-0 18XBRD TIE BLK (SUTURE) IMPLANT
SUT VICRYL 3-0 CR8 SH (SUTURE) ×2 IMPLANT
SYR 5ML LUER SLIP (SYRINGE) ×2 IMPLANT
SYR CONTROL 10ML LL (SYRINGE) ×2 IMPLANT
TOWEL GREEN STERILE FF (TOWEL DISPOSABLE) ×4 IMPLANT
TUBE CONNECTING 20X1/4 (TUBING) IMPLANT
YANKAUER SUCT BULB TIP NO VENT (SUCTIONS) IMPLANT

## 2020-10-18 NOTE — Progress Notes (Signed)
Pharmacist Chemotherapy Monitoring - Initial Assessment    Anticipated start date: 10/25/20  Regimen:  . Are orders appropriate based on the patient's diagnosis, regimen, and cycle? Yes . Does the plan date match the patient's scheduled date? Yes . Is the sequencing of drugs appropriate? Yes . Are the premedications appropriate for the patient's regimen? Yes . Prior Authorization for treatment is: Uninsured o If applicable, is the correct biosimilar selected based on the patient's insurance? yes  Organ Function and Labs: Marland Kitchen Are dose adjustments needed based on the patient's renal function, hepatic function, or hematologic function? Yes . Are appropriate labs ordered prior to the start of patient's treatment? Yes . Other organ system assessment, if indicated: anthracyclines: Echo/ MUGA . The following baseline labs, if indicated, have been ordered: N/A  Dose Assessment: . Are the drug doses appropriate? Yes . Are the following correct: o Drug concentrations Yes o IV fluid compatible with drug Yes o Administration routes Yes o Timing of therapy Yes . If applicable, does the patient have documented access for treatment and/or plans for port-a-cath placement? yes . If applicable, have lifetime cumulative doses been properly documented and assessed? not applicable Lifetime Dose Tracking  No doses have been documented on this patient for the following tracked chemicals: Doxorubicin, Epirubicin, Idarubicin, Daunorubicin, Mitoxantrone, Bleomycin, Oxaliplatin, Carboplatin, Liposomal Doxorubicin  o   Toxicity Monitoring/Prevention: . The patient has the following take home antiemetics prescribed: Prochlorperazine . The patient has the following take home medications prescribed: N/A . Medication allergies and previous infusion related reactions, if applicable, have been reviewed and addressed. Yes . The patient's current medication list has been assessed for drug-drug interactions with their  chemotherapy regimen. no significant drug-drug interactions were identified on review.  Order Review: . Are the treatment plan orders signed? No . Is the patient scheduled to see a provider prior to their treatment? Yes  I verify that I have reviewed each item in the above checklist and answered each question accordingly.  Philomena Course, [place], 10/18/2020  3:01 PM

## 2020-10-18 NOTE — Op Note (Signed)
Preoperative diagnosis: Poor venous access in need of chemotherapy for breast cancer  Postoperative diagnosis: Same  Procedure: Placement of right internal jugular 8 [place] port with C arm guidance and ultrasound guidance  Surgeon: Erroll Luna, MD  Anesthesia: LMA with 0.25% Marcaine plain  EBL: 30 cc  Specimen: None  Drains: None  Indications for procedure: The patient is a 30 year old female with recently diagnosed breast cancer.  Neoadjuvant chemotherapy is to be started soon and she presents for port placement for chemotherapy.  She was seen in the multidisciplinary clinic and the goal was for reduction in tumor size and potential breast conserving surgery versus mastectomy down the road.  We discussed the pros and cons of port placement.  We discussed the rationale for putting a port in.  We discussed alternatives to port placement as well.  Discussed risk of bleeding, infection, pneumothorax, hemothorax, injury to neck structures, injury mediastinal structures, DVT, catheter malfunction, catheter migration, revisional surgery,death and need for other treatments and procedures. She agrees to proceed.  Description of procedure: The patient was met in the holding area and questions were answered.  She was taken back to the operative room placed supine on the OR table.  After induction of general esthesia, both arms were tucked.  Her neck and upper chest regions were prepped and draped in a sterile fashion timeout performed.  Proper patient, site and procedure were verified.  She was placed in Trendelenburg.  Ultrasound used in the right internal jugular vein was visualized.  Needle was then advanced toward it.  Multiple attempts to cannulate this were attempted with a brief return of blood.  We were unable to keep the needle in the vein.  We placed the patient more steep Trendelenburg and tried multiple other attempts to cannulate the vein in the right neck.  After multiple failed  attempts we opted to try a right subclavian approach.  Multiple attempts under the clavicle were passed.  We were unable to draw back blood consistently and/or have a good return.  At this point time I reexamined the syringe needle.  There appeared to be a poor seal between the 2.  We changed out the syringe and attempted to cannulate the right internal jugular vein again.  This was done with ultrasound guidance.  Again we were unable to cannulate it and had a significant air leak between the needle and the syringe.  We then opened a new kit.  We used a new needle and a new syringe.  We were able to then easily cannulate the right internal jugular vein without difficulty.  Dark nonpulsatile blood return and a wire was fed through the needle without difficulty or resistance.  C-arm used to verify location of the wire going down the superior vena cava into the right ventricle.  She had minimal ectopy.  Local anesthetic was used at the injection site and then a small area below the right clavicle was infiltrated.  A 3 cm incision was made in the clavicle.  Small pocket was made there for the port reservoir.  The incision at the wire insertion site was made larger with a #11 blade and hemostat.  A tunnel was created from the wire insertion site to the pocket below the right clavicle.  Tendon passer used and passed from the lower incision to the upper incision.  Catheter then pulled up to the wire insertion site.  Help attached.  Catheter flushed.  It was cut to 20 cm.  With the patient  Trendelenburg we were able to advance a dilator over the wire.  She had very very thick skin making this somewhat challenging.  After carefully passing the dilator the wire we made a tunnel.  We then placed the introducer over top of this.  Then passed these together over the wire moving wire to and fro without resistance.  Once this was achieved we took out the inner cannula and wire leaving the introducer in place.  The catheter was  passed through this and the peel-away sheath was peeled away without difficulty.  C-arm showed the tip to be in the distal superior vena cava.  It drew back easily and flushed easily.  We then placed 5 cc of heparinized saline prior units into the catheter.  Hemostasis was achieved.  We then closed the incisions with 3-0 Vicryl for Monocryl.  Final C-arm image showed the catheter to be unkinked in proper position at this point time.  There were no obvious complicating features this point.  After placement of Dermabond  On the incisions, she was extubated taken to recovery in satisfactory condition.

## 2020-10-18 NOTE — Interval H&P Note (Signed)
History and Physical Interval Note:  10/18/2020 8:16 AM  Gabriela Sullivan  has presented today for surgery, with the diagnosis of POOR VENOUS ACCESS BREAST CANCER.  The various methods of treatment have been discussed with the patient and family. After consideration of risks, benefits and other options for treatment, the patient has consented to  Procedure(s): INSERTION PORT-A-CATH (N/A) as a surgical intervention.  The patient's history has been reviewed, patient examined, no change in status, stable for surgery.  I have reviewed the patient's chart and labs.  Questions were answered to the patient's satisfaction.   Risk of bleeding, infection, collapse lung, DVT , cardiac injury , death, catheter malfunction and revision.  Turner Daniels MD

## 2020-10-18 NOTE — Anesthesia Postprocedure Evaluation (Signed)
Anesthesia Post Note  Patient: Gabriela Sullivan  Procedure(s) Performed: INSERTION PORT-A-CATH (Right Chest)     Patient location during evaluation: PACU Anesthesia Type: General Level of consciousness: awake and alert, oriented and patient cooperative Pain management: pain level controlled Vital Signs Assessment: post-procedure vital signs reviewed and stable Respiratory status: spontaneous breathing, nonlabored ventilation and respiratory function stable Cardiovascular status: blood pressure returned to baseline and stable Postop Assessment: no apparent nausea or vomiting Anesthetic complications: no   No complications documented.  Last Vitals:  Vitals:   10/18/20 1042 10/18/20 1115  BP: 109/64   Pulse: 85   Resp: 18 20  Temp: 36.6 C   SpO2: 96%     Last Pain:  Vitals:   10/18/20 1115  TempSrc:   PainSc: [place]

## 2020-10-18 NOTE — Anesthesia Procedure Notes (Signed)
Procedure Name: LMA Insertion Date/Time: 10/18/2020 8:30 AM Performed by: Bufford Spikes, CRNA Pre-anesthesia Checklist: Patient identified, Emergency Drugs available, Suction available and Patient being monitored Patient Re-evaluated:Patient Re-evaluated prior to induction Oxygen Delivery Method: Circle system utilized Preoxygenation: Pre-oxygenation with 100% oxygen Induction Type: IV induction Ventilation: Mask ventilation without difficulty LMA: LMA inserted LMA Size: 3.0 Number of attempts: 1 Placement Confirmation: positive ETCO2 Tube secured with: Tape Dental Injury: Teeth and Oropharynx as per pre-operative assessment

## 2020-10-18 NOTE — Discharge Instructions (Signed)
PORT-A-CATH: POST OP INSTRUCTIONS  Always review your discharge instruction sheet given to you by the facility where your surgery was performed.   1. A prescription for pain medication may be given to you upon discharge. Take your pain medication as prescribed, if needed. If narcotic pain medicine is not needed, then you make take acetaminophen (Tylenol) or ibuprofen (Advil) as needed.  2. Take your usually prescribed medications unless otherwise directed. 3. If you need a refill on your pain medication, please contact our office. All narcotic pain medicine now requires a paper prescription.  Phoned in and fax refills are no longer allowed by law.  Prescriptions will not be filled after 5 pm or on weekends.  4. You should follow a light diet for the remainder of the day after your procedure. 5. Most patients will experience some mild swelling and/or bruising in the area of the incision. It may take several days to resolve. 6. It is common to experience some constipation if taking pain medication after surgery. Increasing fluid intake and taking a stool softener (such as Colace) will usually help or prevent this problem from occurring. A mild laxative (Milk of Magnesia or Miralax) should be taken according to package directions if there are no bowel movements after 48 hours.  7. Unless discharge instructions indicate otherwise, you may remove your bandages 48 hours after surgery, and you may shower at that time. You may have steri-strips (small white skin tapes) in place directly over the incision.  These strips should be left on the skin for 7-10 days.  If your surgeon used Dermabond (skin glue) on the incision, you may shower in 24 hours.  The glue will flake off over the next 2-3 weeks.  8. If your port is left accessed at the end of surgery (needle left in port), the dressing cannot get wet and should only by changed by a healthcare professional. When the port is no longer accessed (when the  needle has been removed), follow step 7.   9. ACTIVITIES:  Limit activity involving your arms for the next 72 hours. Do no strenuous exercise or activity for 1 week. You may drive when you are no longer taking prescription pain medication, you can comfortably wear a seatbelt, and you can maneuver your car. 10.You may need to see your doctor in the office for a follow-up appointment.  Please       check with your doctor.  11.When you receive a new Port-a-Cath, you will get a product guide and        ID card.  Please keep them in case you need them.  WHEN TO CALL YOUR DOCTOR [phone]): 1. Fever over 101.0 2. Chills 3. Continued bleeding from incision 4. Increased redness and tenderness at the site 5. Shortness of breath, difficulty breathing   The clinic staff is available to answer your questions during regular business hours. Please don't hesitate to call and ask to speak to one of the nurses or medical assistants for clinical concerns. If you have a medical emergency, go to the nearest emergency room or call 911.  A surgeon from [facility] is always on call at the hospital.     For further information, please visit [email or web address]   Post Anesthesia Home Care Instructions  Activity: Get plenty of rest for the remainder of the day. A responsible individual must stay with you for 24 hours following the procedure.  For the next 24 hours, DO NOT: -Drive a car -  Operate machinery -Drink alcoholic beverages -Take any medication unless instructed by your physician -Make any legal decisions or sign important papers.  Meals: Start with liquid foods such as gelatin or soup. Progress to regular foods as tolerated. Avoid greasy, spicy, heavy foods. If nausea and/or vomiting occur, drink only clear liquids until the nausea and/or vomiting subsides. Call your physician if vomiting continues.  Special Instructions/Symptoms: Your throat may feel dry or sore from  the anesthesia or the breathing tube placed in your throat during surgery. If this causes discomfort, gargle with warm salt water. The discomfort should disappear within 24 hours.  If you had a scopolamine patch placed behind your ear for the management of post- operative nausea and/or vomiting:  1. The medication in the patch is effective for 72 hours, after which it should be removed.  Wrap patch in a tissue and discard in the trash. Wash hands thoroughly with soap and water. 2. You may remove the patch earlier than 72 hours if you experience unpleasant side effects which may include dry mouth, dizziness or visual disturbances. 3. Avoid touching the patch. Wash your hands with soap and water after contact with the patch.     Oxycodone 5mg  given at 11:15am today

## 2020-10-18 NOTE — Transfer of Care (Signed)
Immediate Anesthesia Transfer of Care Note  Patient: Gabriela Sullivan  Procedure(s) Performed: INSERTION PORT-A-CATH (Right Chest)  Patient Location: PACU  Anesthesia Type:General  Level of Consciousness: awake, alert  and oriented  Airway & Oxygen Therapy: Patient Spontanous Breathing and Patient connected to nasal cannula oxygen  Post-op Assessment: Report given to RN and Post -op Vital signs reviewed and stable  Post vital signs: Reviewed and stable  Last Vitals:  Vitals Value Taken Time  BP 115/73 10/18/20 0952  Temp    Pulse 90 10/18/20 0953  Resp 14 10/18/20 0953  SpO2 100 % 10/18/20 0953  Vitals shown include unvalidated device data.  Last Pain:  Vitals:   10/18/20 0706  TempSrc: [place]  PainSc: 0-No pain         Complications: No complications documented.

## 2020-10-20 ENCOUNTER — Encounter (HOSPITAL_BASED_OUTPATIENT_CLINIC_OR_DEPARTMENT_OTHER): Payer: Self-pay | Admitting: Surgery

## 2020-10-21 ENCOUNTER — Other Ambulatory Visit: Payer: Self-pay

## 2020-10-21 ENCOUNTER — Ambulatory Visit (HOSPITAL_COMMUNITY)
Admission: RE | Admit: 2020-10-21 | Discharge: 2020-10-21 | Disposition: A | Discharge status: Home / Self Care | Payer: Medicaid Other | Source: Ambulatory Visit | Attending: Oncology | Admitting: Oncology

## 2020-10-21 DIAGNOSIS — C50412 Malignant neoplasm of upper-outer quadrant of left female breast: Secondary | ICD-10-CM | POA: Diagnosis present

## 2020-10-21 DIAGNOSIS — M899 Disorder of bone, unspecified: Secondary | ICD-10-CM | POA: Diagnosis present

## 2020-10-21 DIAGNOSIS — Z170 Estrogen receptor positive status [ER+]: Secondary | ICD-10-CM | POA: Diagnosis present

## 2020-10-21 IMAGING — MR MR TOTAL SPINE METS SCREENING
10 of 18 series · 27 of 48 positions shown · IV contrast (9 GADAVIST)
Comparison: Whole-body bone scan [DATE].  CT Chest [DATE].

CLINICAL DATA: 29-year-old female with breast cancer. CT evidence
of osseous metastatic disease.

EXAM:
MRI TOTAL SPINE WITHOUT AND WITH CONTRAST
TECHNIQUE: Multisequence MR imaging of the spine from the cervical spine to the
sacrum was performed prior to and following IV contrast
administration for evaluation of spinal metastatic disease.
CONTRAST:  9mL GADAVIST GADOBUTROL 1 MMOL/ML IV SOLN

[Series 16: T1 · sagittal · 4.0mm · 1.15mm/px · 2 of 8 slices shown (1 of 5)]
[im 1/8]
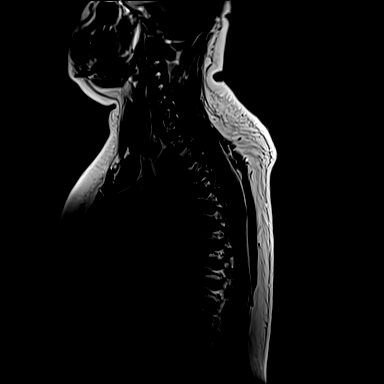
[im 8/8]
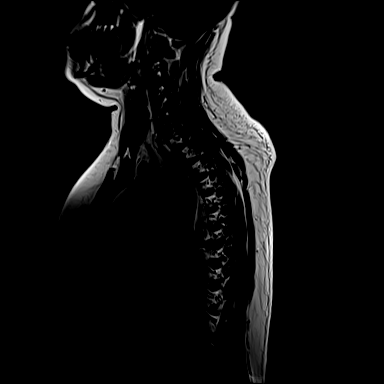

[Series 17: T1 · sagittal · 4.0mm · 1.00mm/px · 3 of 13 slices shown (2 of 5)]
[im 1/13]
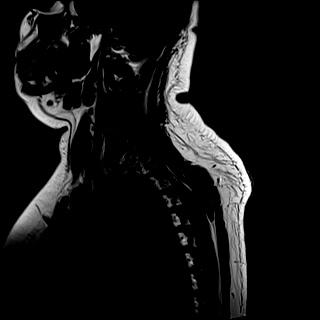
[im 7/13]
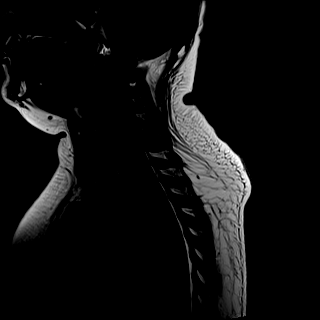
[im 13/13]
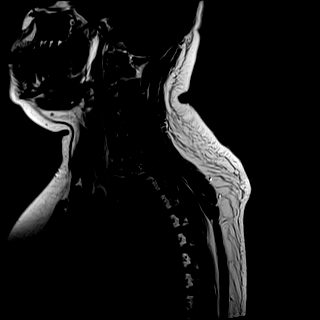

[Series 18: T1 · sagittal · 4.0mm · 1.19mm/px · 4 of 18 slices shown (3 of 5)]
[im 1/18]
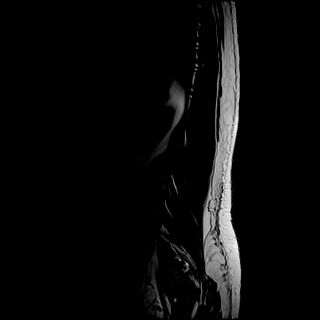
[im 6/18]
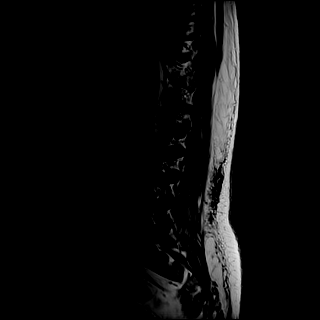
[im 12/18]
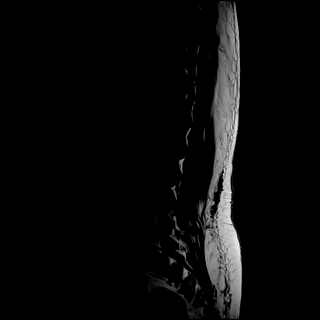
[im 18/18]
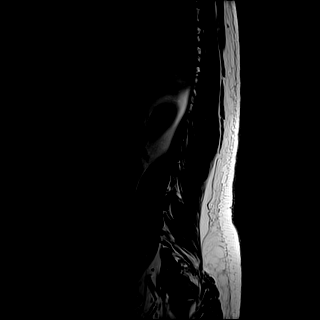

[Series 19: T1 · sagittal · 4.0mm · 1.00mm/px · 3 of 13 slices shown (4 of 5)]
[im 1/13]
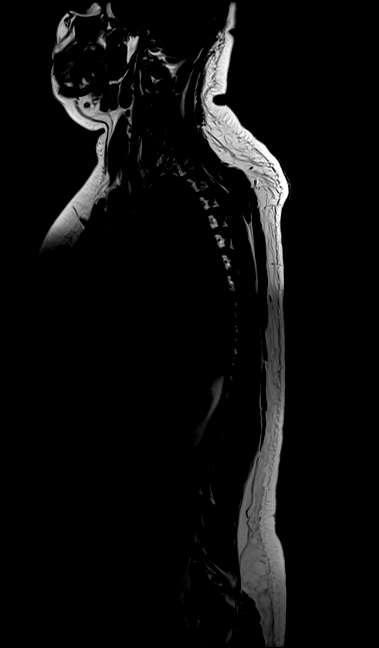
[im 7/13]
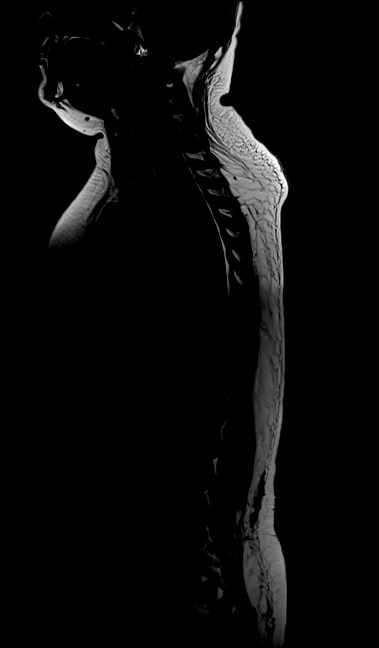
[im 13/13]
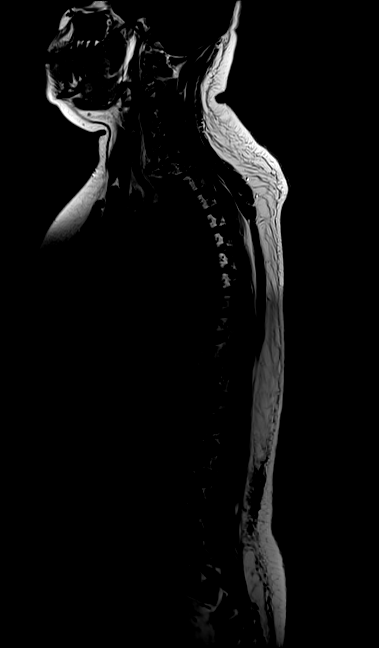

[Series 20: T1 · sagittal · 4.0mm · 1.00mm/px · 1 of 13 slices shown (5 of 5)]
[im 1/13]
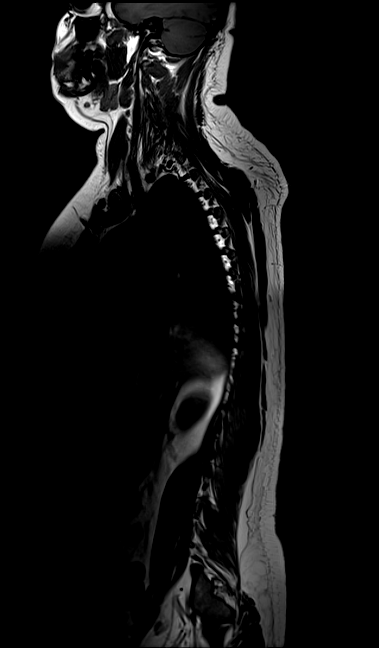

[Series 29: T2 post-contrast · sagittal · 4.0mm · 0.83mm/px · 2 of 13 slices shown (1 of 3)]
[im 1/13]
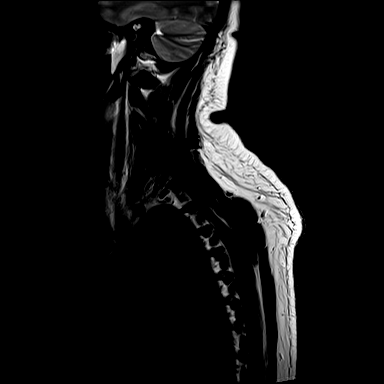
[im 13/13]
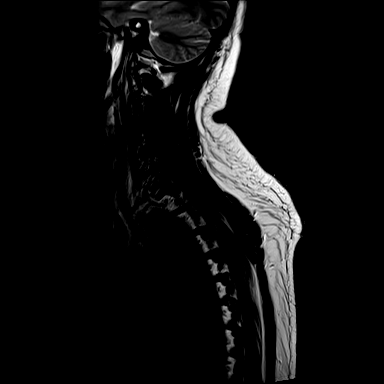

[Series 30: T2 post-contrast · sagittal · 4.0mm · 0.83mm/px · 3 of 18 slices shown (2 of 3)]
[im 1/18]
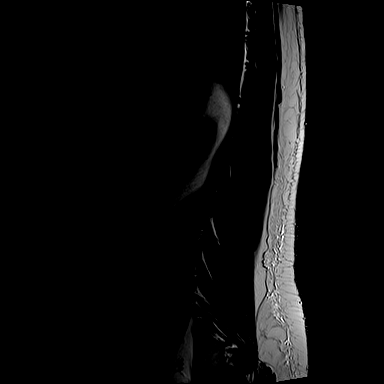
[im 9/18]
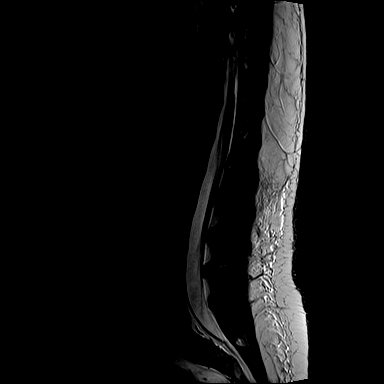
[im 18/18]
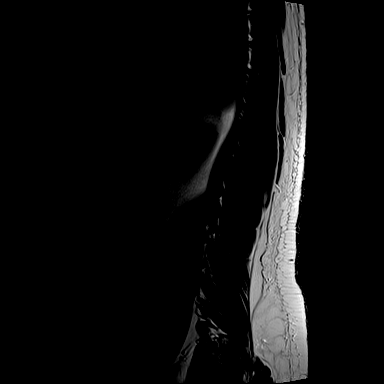

[Series 31: T2 · sagittal · 4.4mm · 0.83mm/px · 2 of 13 slices shown (1 of 2)]
[im 1/13]
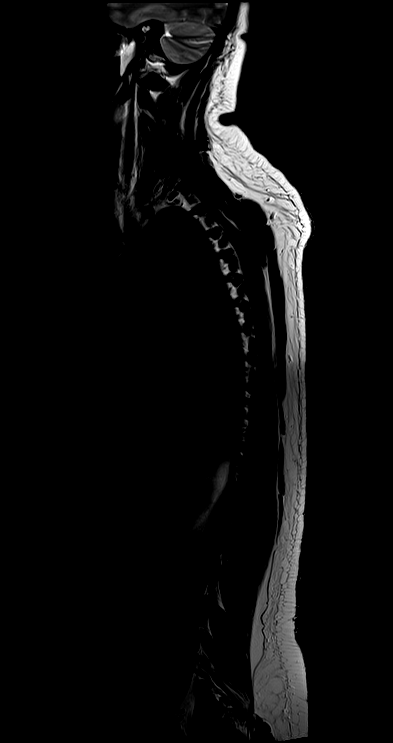
[im 13/13]
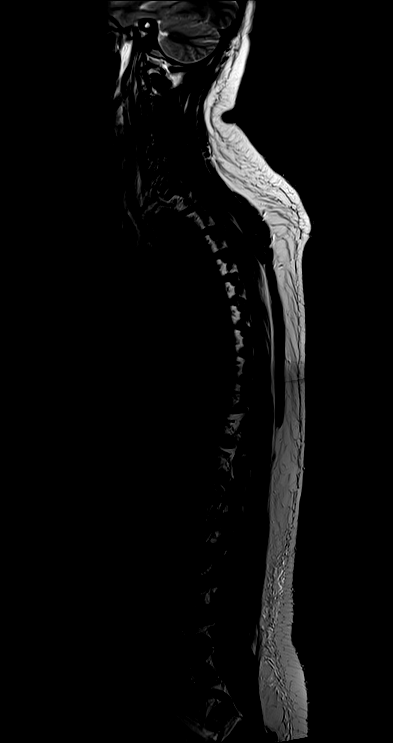

[Series 32: T2 · sagittal · 4.4mm · 0.83mm/px · 2 of 13 slices shown (2 of 2)]
[im 1/13]
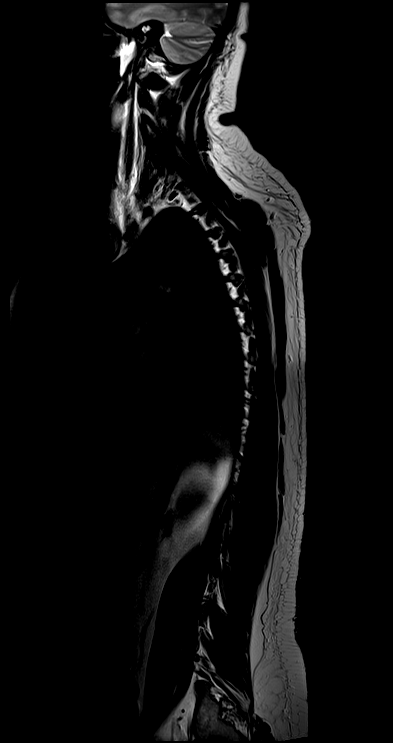
[im 13/13]
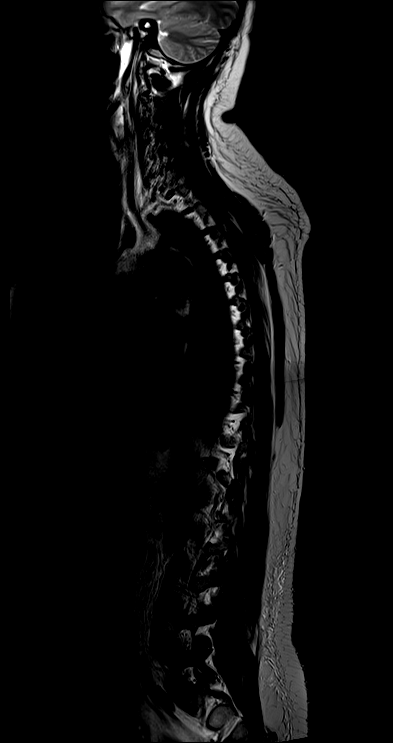

[Series 33: T2 post-contrast · axial · 4.0mm · 0.62mm/px · z∈[-190,-46]mm · 5 of 30 slices shown (3 of 3)]
[im 1/30]
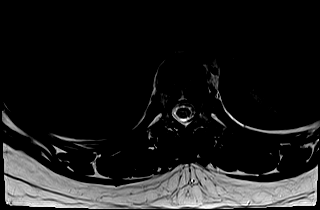
[im 8/30]
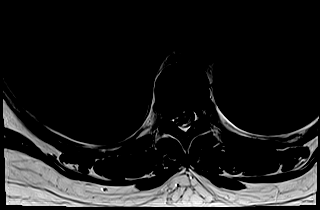
[im 15/30]
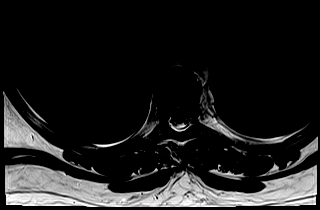
[im 22/30]
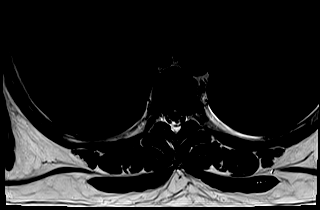
[im 30/30]
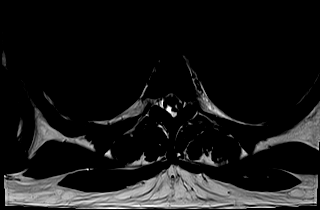

[27 of 48 positions shown; findings below may reference images not displayed]

FINDINGS: MRI CERVICAL SPINE FINDINGS

Alignment: Mild straightening of lordosis.  No spondylolisthesis.

Vertebrae: Mildly heterogeneous marrow signal, but no marrow edema
or suspicious vertebral enhancement.

Cord: Appears normal. No dural thickening or abnormal intradural
enhancement identified.

Posterior Fossa, vertebral arteries, paraspinal tissues:
Cervicomedullary junction and visible brain parenchyma appear
grossly negative. Negative visible paraspinal soft tissues.

Disc levels:

No significant degenerative changes.  No cervical spinal stenosis.

MRI THORACIC SPINE FINDINGS

Segmentation:  Normal.

Alignment:  Relatively normal cervical lordosis.

Vertebrae:

Abnormal enhancement and marrow edema within the left T2 pars and
spinous process (series 21 images 10 through 13 and series 25,
images 10 and 12) corresponds to a lucent osseous lesion on the
recent chest CT.

Subtle small round enhancing T4 vertebral body metastasis also
suspected on series 25, image 8.

Subtle right T6 transverse process metastasis also suspected on
series 25, image 3.

Heterogeneously enhancing 10 mm metastasis in the anterior lower T10
body was heterogeneous on the recent CT.

11 mm right side T11 and subtle 6 mm left side T11 enhancing
metastases were lytic on the comparison CT.

And there is heterogeneous metastatic involvement of the T12
vertebra especially the spinous process (series 26, image 10).

No extraosseous extension of tumor.

Cord: No thoracic cord signal abnormality. No abnormal intradural
enhancement or dural thickening. Conus medullaris appears normal at
T12-L1.

Paraspinal and other soft tissues: Negative.

Disc levels:

Multiple degenerative disc protrusions in the thoracic spine at
T6-T7 (left paracentral series 33, image 6), T7-T8 (left paracentral
image 10), and T9-T10 (moderate right paracentral series 30, image
11 and series 33, image 23) result in mild degenerative spinal
stenosis with up to mild cord mass effect at the latter.

MRI LUMBAR SPINE FINDINGS

Segmentation:  Normal.

Alignment:  Preserved lumbar lordosis.

Vertebrae: Subtle anterior L2 vertebral body enhancing metastasis
suspected on series 26, image 10, 7 mm.

Small L4 posteroinferior 9 mm enhancing metastasis.

Subtle L5 6-7 mm mildly enhancing metastasis (series 26, image 8 and
series 22, image 8).

No extraosseous extension of tumor.

Conus medullaris: Extends to the T12-L1 level and appears normal. No
abnormal intradural enhancement. No dural thickening.

Paraspinal and other soft tissues: Negative.

Disc levels:

No significant degenerative changes.

Other findings: Confluent sacral metastatic disease at the central
S2 body and extending into the right sacral ala toward the right SI
joint. See series 26, image 9 and series 22 images 3 through 9.
IMPRESSION: 1. No cervical vertebral metastasis.
Widely scattered thoracic and lumbar vertebral body and posterior
element metastases. These are generally small, with no extraosseous
or epidural space extension.
Confluent sacral metastasis extends from the S2 body into the right
sacral ala.

2. No spinal cord or cauda equina metastasis. No malignant spinal
stenosis.

3. Multiple degenerative thoracic disc herniations, with mild
degenerative spinal stenosis and mild spinal cord mass effect at the
T9-T10 level, but no cord signal abnormality.

## 2020-10-21 MED ORDER — GADOBUTROL 1 MMOL/ML IV SOLN
9.0000 mL | Freq: Once | INTRAVENOUS | Status: AC | PRN
  Administered 2020-10-21: 9 mL via INTRAVENOUS

## 2020-10-23 ENCOUNTER — Encounter: Payer: Self-pay | Admitting: *Deleted

## 2020-10-23 ENCOUNTER — Other Ambulatory Visit: Payer: Self-pay | Admitting: Oncology

## 2020-10-24 ENCOUNTER — Other Ambulatory Visit: Payer: Self-pay | Admitting: Oncology

## 2020-10-24 NOTE — Progress Notes (Signed)
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 She was evaluated in the multidisciplinary breast cancer clinic on 10/04/2020.  Since consultation, she underwent breast MRI on 10/06/2020 showing: breast composition C; extensive abnormal, predominantly non-mass, enhancement throughout lateral left breast involving both outer quadrants, consistent with a more extensive expanse of malignancy than evident on mammography or ultrasound, measuring 11 cm; additional mass-like abnormal enhancement in anterior, inferior left breast, measuring 1.9 cm; skin thickening and enhancement of left breast, which predominates laterally; no findings to suggest additional right breast malignancy; multiple abnormal left axillary lymph nodes, 6 of which are enlarged/abnormal by size/cortical thickness (one of these being the biopsy-proven metastatic node).  She underwent echocardiogram on 10/13/2020 showing an ejection fraction of 60-65%.  She also underwent staging CT and bone scan on 10/13/2020. Chest CT showed: enlarged left axillary lymph nodes; several lucent lesions identified within lower thoracic spine and sternum.  Bone scan showed: lucent lesions seen on CT scan not well visualized on bone scans; uptake near  midline of lower thoracic or upper lumbar vertebral body, nonspecific; no other scintigraphic evidence of bony metastatic disease.  She had her port placed on 10/18/2020 in anticipation of beginning neoadjuvant chemotherapy today.  She proceeded to total spine MRI on 10/21/2020 showing: no cervical vertebral metastasis; widely scattered thoracic and lumbar vertebral body and posterior element metastases, generally small with no extraosseous or epidural space extension; confluent sacral metastasis extends from S2 body into right sacral ala; no spinal cord or cauda equina metastasis.  She had genetic testing done during clinic. The results are still pending.   REVIEW OF SYSTEMS: Gabriela Sullivan tolerated port placement with no complications.  She has not wanted to take any of the narcotic pain medicine because she says she is seeing what that does to other people.  She has a good understanding of how to take her supportive medicines and she has all of them on hand.  She tells me her parents are very sad but they are supportive.  She feels very ready to start treatment today.   COVID 19 VACCINATION STATUS: refuses vaccination   HISTORY OF CURRENT ILLNESS: From the original intake note:  Gabriela Sullivan noted a marble sized lump in her left breast in 2020.  She ignored it.  Around September 2021 this mass was "golf sized" and she brought it to her mother's attention.  She did not bring it to medical attention until several months later and she finally underwent bilateral diagnostic mammography with tomography and left breast ultrasonography at [facility] on 09/12/2020 showing: breast density category C; 5.1 cm ill-defined outer left breast mass corresponding to patient's palpable abnormality; at least 4 abnormal left axillary lymph nodes with cortical thickening; indeterminate 0.8 cm group of outer right breast calcifications; indeterminate 6 cm upper-outer left breast calcifications.  Accordingly on 09/26/2020 she proceeded  to biopsy of the bilateral  breast areas in question. The pathology from this procedure ([id number]) showed:  1. Left Breast, upper-outer  - invasive ductal carcinoma, grade 2.  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 Proliferation marker Ki67 at 10%. HER2 equivocal by immunohistochemistry (2+), but negative by fluorescent in situ hybridization with a signals ratio 1.61 and number per cell 2.65.  2. 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  She underwent additional biopsies on 09/26/2020. Pathology from the procedure ([id number]) showed: 1. Left Breast, 3 o'clock  - invasive ductal carcinoma, grade 2  - ductal carcinoma in situ  - Prognostic indicators significant for: estrogen receptor, 60% positive with moderate staining intensity and progesterone receptor, 10% positive with strong staining intensity. Proliferation marker Ki67 at 15%. HER2 equivocal by immunohistochemistry (2+), but negative by fluorescent in situ hybridization with a signals ratio 1.66 and number per cell 2.65. 2. Left Axilla Lymph Node  - metastatic carcinoma  Cancer Staging Ductal carcinoma in situ (DCIS) of right breast Staging form: Breast, AJCC 8th Edition - Clinical: Stage 0 (cTis (DCIS), cN0, cM0) - Signed by Chauncey Cruel, MD on 10/04/2020  Malignant neoplasm of upper-outer quadrant of left breast in female, estrogen receptor positive ([place]) Staging form: Breast, AJCC 8th Edition - Clinical stage from 10/04/2020: Stage IIA (cT3, cN1(f), cM0, G2, ER+, PR+, HER2-) - Signed by Chauncey Cruel, MD on 10/04/2020 Stage prefix: Initial  diagnosis Method of lymph node assessment: Core biopsy Histologic grading system: 3 grade system  The patient's subsequent history is as detailed below.   PAST MEDICAL HISTORY: Past Medical History:  Diagnosis Date  . Asthma    remote  . Breast cancer ([place])   . Family history of breast cancer   . Family history of ovarian cancer   . Family history of prostate cancer   . Family history of stomach cancer   . Migraines   . PCOS (polycystic ovarian syndrome)     PAST SURGICAL HISTORY: Past Surgical History:  Procedure Laterality Date  . NO PAST SURGERIES    . PORTACATH PLACEMENT Right 10/18/2020   Procedure: INSERTION PORT-A-CATH;  Surgeon: Erroll Luna, MD;  Location: [place];  Service: General;  Laterality: Right;    FAMILY HISTORY: Family History  Problem Relation Age of Onset  . Ovarian cancer Maternal Grandmother        unknown age of diagnosis  . Prostate cancer Maternal Grandfather        metastatic  . Breast cancer Cousin        unknown age of diagnosis, maternal first cousin  . Stomach cancer Cousin        dx 20s/30s, maternal first cousin   She has not information on her father. Her mother is 45 as of 10/2020. Aleece has one brother and one sister. She reports cancer in her maternal family-- breast in a cousin, ovarian in her grandmother, prostate in her grandfather, and stomach in a cousin.   GYNECOLOGIC HISTORY:  No LMP recorded. (Menstrual status: Irregular Periods). Menarche: 30 years old [place] P 0 (s/p miscarriage in 2013) LMP 1/26 - 07/03/2020, irregular, goes 2-6 months between periods Contraceptive never used HRT n/a  Hysterectomy? no BSO? no   SOCIAL  HISTORY: (updated 10/2020)  Cassaundra is currently working as a Freight forwarder at [employer] in [place]. She is single. She lives at home with her mother (who is a stay-at-home mom), their two dogs, and one cat. She is not a Designer, fashion/clothing.    ADVANCED DIRECTIVES: not in  place; at the 10/04/2020 visit the patient was given the appropriate documents to complete and notarized at her discretion.   HEALTH MAINTENANCE: Social History   Tobacco Use  . Smoking status: Never Smoker  . Smokeless tobacco: Never Used  Substance Use Topics  . Alcohol use: Never  . Drug use: Never     Colonoscopy: n/a (age)  PAP: 2018  Bone density: n/a (age)   No Known Allergies  Current Outpatient Medications  Medication Sig Dispense Refill  . dexamethasone (DECADRON) 4 MG tablet Take 2 tablets (8 mg total) by mouth daily. Take daily for 3 days after chemo. Take with food. 30 tablet 1  . Goserelin Acetate (ZOLADEX [employer]) Inject into the skin once a week.    Marland Kitchen ibuprofen (ADVIL) 800 MG tablet Take 1 tablet (800 mg total) by mouth every 8 (eight) hours as needed. 30 tablet 0  . lidocaine-prilocaine (EMLA) cream Apply to affected area once 30 g 3  . oxyCODONE (OXY IR/ROXICODONE) 5 MG immediate release tablet Take 1 tablet (5 mg total) by mouth every 6 (six) hours as needed for severe pain. 15 tablet 0  . prochlorperazine (COMPAZINE) 10 MG tablet Take 1 tablet (10 mg total) by mouth every 6 (six) hours as needed (Nausea or vomiting). 30 tablet 1  . Vitamin D, Ergocalciferol, (DRISDOL) 50000 UNITS CAPS capsule Take 50,000 Units by mouth every 7 (seven) days.     No current facility-administered medications for this visit.    OBJECTIVE: White woman in no acute distress  Vitals:   10/25/20 0804  BP: (!) 144/99  Pulse: 76  Resp: 18  Temp: 97.7 F (36.5 C)  SpO2: 99%     Body mass index is 38.84 kg/m.   Wt Readings from Last 3 Encounters:  10/25/20 198 lb 14.4 oz (90.2 kg)  10/18/20 204 lb 5.9 oz (92.7 kg)  10/04/20 200 lb 9.6 oz (91 kg)      ECOG FS:1 - Symptomatic but completely ambulatory  Sclerae unicteric, EOMs intact Oropharynx clear and moist No cervical or supraclavicular adenopathy Lungs no rales or rhonchi Heart regular rate and rhythm Abd soft, nontender,  positive bowel sounds MSK no focal spinal tenderness, no upper extremity lymphedema Neuro: nonfocal, well oriented, appropriate affect Breasts: The right breast is unremarkable.  The left breast shows inflammatory changes with erythema and peau d'orange, imaged below.  There is palpable left axillary adenopathy.   Left breast 10/04/2020   LAB RESULTS:  CMP     Component Value Date/Time   NA 140 10/25/2020 0740   K 3.8 10/25/2020 0740   CL 107 10/25/2020 0740   CO2 24 10/25/2020 0740   GLUCOSE 131 (H) 10/25/2020 0740   BUN 18 10/25/2020 0740   CREATININE 0.58 10/25/2020 0740   CREATININE 0.64 10/04/2020 0755   CALCIUM 9.2 10/25/2020 0740   PROT 7.5 10/25/2020 0740   ALBUMIN 4.1 10/25/2020 0740   AST 50 (H) 10/25/2020 0740   AST 47 (H) 10/04/2020 0755   ALT 117 (H) 10/25/2020 0740   ALT 108 (H) 10/04/2020 0755   ALKPHOS 68 10/25/2020 0740   BILITOT 0.4 10/25/2020 0740   BILITOT 0.7 10/04/2020 0755   GFRNONAA >60  10/25/2020 0740   GFRNONAA >60 10/04/2020 0755    No results found for: TOTALPROTELP, ALBUMINELP, A1GS, A2GS, BETS, BETA2SER, GAMS, MSPIKE, SPEI  Lab Results  Component Value Date   WBC 6.8 10/25/2020   NEUTROABS 3.7 10/25/2020   HGB 14.0 10/25/2020   HCT 41.9 10/25/2020   MCV 83.1 10/25/2020   PLT 348 10/25/2020    No results found for: LABCA2  No components found for: LABCAN125  No results for input(s): INR in the last 168 hours.  No results found for: LABCA2  No results found for: CAN199  No results found for: CAN125  No results found for: CAN153  No results found for: CA2729  No components found for: HGQUANT  No results found for: CEA1 / No results found for: CEA1   No results found for: AFPTUMOR  No results found for: CHROMOGRNA  No results found for: KPAFRELGTCHN, LAMBDASER, KAPLAMBRATIO (kappa/lambda light chains)  No results found for: HGBA, HGBA2QUANT, HGBFQUANT, HGBSQUAN (Hemoglobinopathy evaluation)   No results found for:  LDH  No results found for: IRON, TIBC, IRONPCTSAT (Iron and TIBC)  No results found for: FERRITIN  Urinalysis No results found for: COLORURINE, APPEARANCEUR, LABSPEC, PHURINE, GLUCOSEU, HGBUR, BILIRUBINUR, KETONESUR, PROTEINUR, UROBILINOGEN, NITRITE, LEUKOCYTESUR   STUDIES: CT CHEST W CONTRAST  Result Date: 10/15/2020 CLINICAL DATA:  Staging left breast cancer. EXAM: CT CHEST WITH CONTRAST TECHNIQUE: Multidetector CT imaging of the chest was performed during intravenous contrast administration. CONTRAST:  60mL OMNIPAQUE IOHEXOL 300 MG/ML  SOLN COMPARISON:  None FINDINGS: Cardiovascular: The heart size appears within normal limits. No pericardial effusion. Mediastinum/Nodes: Normal appearance of the thyroid gland. The trachea appears patent and is midline. Normal appearance of the esophagus. No enlarged supraclavicular, right axillary, mediastinal, or hilar lymph nodes. Multiple enlarged left axillary lymph nodes are identified. The largest measures 2.2 cm short axis, image 43/2. Lungs/Pleura: Lungs are clear. No pleural effusion or pneumothorax. No suspicious lung nodules. Upper Abdomen: No acute abnormality. Musculoskeletal: Within the T11 vertebral body there are 2 lucent bone lesions which are nonspecific measuring up to 7 mm. Within the distal body of sternum there is a small lucent bone lesion which measures 6 mm, image 114/7. On the MRI images obtained 10/06/2020 there may be a small area of corresponding enhancement noted. A second area within the proximal third of the body of sternum is more subtle in appearance, image 118/7. There is also a focal area of enhancement on the MR images. IMPRESSION: 1. Enlarged left axillary lymph nodes compatible with metastatic adenopathy. 2. Several lucent lesions are identified within the lower thoracic spine and sternum. In retrospect, on the MRI of the breast from 10/07/2014 there are 2 areas of enhancement within the sternum (best seen on the postcontrast  subtraction images) which correspond to the lucent lesions on today's study and are suspicious for bone metastasis. Thoracic spine MRI may be helpful to confirm metastatic disease within the T11 lucent lesion. Electronically Signed   By: Taylor  Stroud M.D.   On: 10/15/2020 06:29   NM Bone Scan Whole Body  Result Date: 10/15/2020 CLINICAL DATA:  Breast cancer staging. Right-sided low back pain for several years. EXAM: NUCLEAR MEDICINE WHOLE BODY BONE SCAN TECHNIQUE: Whole body anterior and posterior images were obtained approximately 3 hours after intravenous injection of radiopharmaceutical. RADIOPHARMACEUTICALS:  20.0 mCi Technetium-99m MDP IV COMPARISON:  CT of the chest Oct 13, 2020 FINDINGS: There is a focus of uptake posteriorly either in the lower thoracic spine or the upper lumbar spine.   Lucent lesions are seen in T11 on the previous CT scan. The uptake on today's study appears to be a lower than the level of these lucent lesions. Lucent lesions were seen in the sternum as well with no correlate on today's study. No other discrete bony lesions are identified. Increased uptake in the left breast consistent with the patient's known malignancy. IMPRESSION: 1. Lucent lesions were seen in the sternum and T11 on the CT scan from today. Purely lucent metastases are often not well visualized on bone scans. An MRI was recommended and would be much more sensitive. 2. Uptake near midline of the lower thoracic or upper lumbar vertebral body. The finding is nonspecific but at least somewhat concerning for a metastasis given history and CT of the chest findings. Extending the recommended MRI of the thoracic spine through L1 would be helpful to evaluate this region. 3. No other scintigraphic evidence of bony metastatic disease. 4. Increased uptake in the left breast consistent with known malignancy. Electronically Signed   By: Dorise Bullion III M.D   On: 10/15/2020 12:08   MR BREAST BILATERAL W WO CONTRAST INC  CAD  Result Date: 10/06/2020 CLINICAL DATA:  Recent diagnosis left grade 2 invasive ductal carcinoma and ductal carcinoma in situ, low grade ductal carcinoma in situ of the right breast and a metastatic left axillary lymph node. LABS:  No labs drawn at time of imaging. EXAM: BILATERAL BREAST MRI WITH AND WITHOUT CONTRAST TECHNIQUE: Multiplanar, multisequence MR images of both breasts were obtained prior to and following the intravenous administration of 10 ml of Gadavist Three-dimensional MR images were rendered by post-processing of the original MR data on an independent workstation. The three-dimensional MR images were interpreted, and findings are reported in the following complete MRI report for this study. Three dimensional images were evaluated at the independent interpreting workstation using the DynaCAD thin client. COMPARISON:  Prior exams. FINDINGS: Breast composition: c. Heterogeneous fibroglandular tissue. Background parenchymal enhancement: Mild Right breast: There is enhancement extending obliquely from superior to inferior consistent with the previous stereotactic biopsy tract. Subtle susceptibility artifact lies along the inferior aspect of this tract consistent with the post biopsy X shaped biopsy clip. At the level of the clip, surrounding enhancement measures 8 mm in greatest dimension. This reflects the low-grade DCIS found on biopsy. There are no other areas of abnormal right breast enhancement. No defined masses. Left breast: Large heterogeneous area predominantly non mass enhancement extends from the anterior to the posterior breast, throughout the upper outer and lower outer quadrants, largest discrete area in the lower outer quadrant, where there is a small focus of susceptibility artifact consistent with the heart shaped biopsy clip. This portion of the abnormal enhancement, in the lower outer quadrant, spans approximately 5.8 x 3.8 cm transversely. The overall span of the abnormal  enhancement measures 11 cm anterior-posterior by 9 cm, superior inferior, by 7 cm right to left. There is separate mass-like area of abnormal enhancement along the inferior, anterior breast, projecting near 6 o'clock, measuring 1.9 x 0.9 x 1.2 cm. This area demonstrates washout kinetics. There is skin thickening with hazy increased skin enhancement throughout the left breast most prominent laterally. Lymph nodes: Multiple enlarged/abnormal left axillary lymph nodes. Artifact from the tribell biopsy clip lies within one of the more lateral lymph nodes. Largest node measures 2.4 cm in short axis. Six lymph nodes are abnormal by size and/or cortical thickness. There are additional prominent subcentimeter lymph nodes. No enlarged or abnormal right axillary lymph  nodes. Ancillary findings:  None. IMPRESSION: 1. Extensive abnormal, predominantly non mass enhancement throughout the lateral left breast involving the lower outer and upper outer quadrants, consistent with a more extensive expanse of malignancy than evident on mammography or ultrasound. This spans 11 x 7 x 9 cm. 2. Additional mass-like abnormal enhancement in the anterior, inferior left breast suspicious for an additional focus of carcinoma. This spans 1.9 x 0.9 x 1.2 cm. 3. Skin thickening and enhancement of the left breast, which predominates laterally. Findings concerning for inflammatory breast carcinoma. 4. 8 mm area of enhancement surrounds the X shaped biopsy clip in right breast, reflecting the biopsy-proven DCIS. Enhancement is also noted along the biopsy tract. There are no findings to suggest additional right breast malignancy. 5. Multiple abnormal left axillary lymph nodes, 6 of which are enlarged/abnormal by size/cortical thickness, 1 of these being the biopsy-proven metastatic lymph node. RECOMMENDATION: 1. MRI guided biopsy the 1.9 cm mass-like area of abnormal enhancement along the anterior, inferior left breast, if this would alter treatment.  2. Otherwise treatment as planned for the known multifocal left breast carcinoma and metastatic lymphadenopathy and small focus of right breast DCIS. BI-RADS CATEGORY  4: Suspicious. Electronically Signed   By: Lajean Manes M.D.   On: 10/06/2020 10:58   DG Chest Port 1 View  Result Date: 10/18/2020 CLINICAL DATA:  30 year old female Port-A-Cath placement. Left breast cancer. EXAM: PORTABLE CHEST 1 VIEW COMPARISON:  Chest CT 10/13/2020. FINDINGS: Portable AP upright view at 0950 hours. Right chest IJ approach Port-A-Cath in place. Catheter tip projects at the lower SVC level just below the carina. No pneumothorax. Lung volumes and mediastinal contours are normal. Allowing for portable technique the lungs are clear. Stable visualized osseous structures. IMPRESSION: Right chest Port-A-Cath placed with no adverse features. Electronically Signed   By: Genevie Ann M.D.   On: 10/18/2020 10:26   DG Fluoro Guide CV Line-No Report  Result Date: 10/18/2020 Fluoroscopy was utilized by the requesting physician.  No radiographic interpretation.   ECHOCARDIOGRAM COMPLETE  Result Date: 10/13/2020    ECHOCARDIOGRAM REPORT   Patient Name:   RYLAN KAUFMANN Date of Exam: 10/13/2020 Medical Rec #:  [id number]  Height:       60.0 in Accession #:    [id number] Weight:       200.0 lb Date of Birth:  1991/04/14   BSA:          1.867 m Patient Age:    29 years   BP:           145/63 mmHg Patient Gender: F          HR:           69 bpm. Exam Location:  Outpatient Procedure: 2D Echo, 3D Echo, Cardiac Doppler, Color Doppler and Strain Analysis                                 MODIFIED REPORT:     This report was modified by Lyman Bishop MD on 10/13/2020 due to Updated                               diastolic function.  Indications:     Z51.11 Encounter for antineoplastic chemotheraphy  History:         Patient has no prior history of Echocardiogram examinations.  Breast Cancer.  Sonographer:     Jonelle Sidle Dance Referring Phys:   [place] Diagnosing Phys: Lyman Bishop MD IMPRESSIONS  1. Left ventricular ejection fraction, by estimation, is 60 to 65%. The left ventricle has normal function. The left ventricle has no regional wall motion abnormalities. Left ventricular diastolic parameters are normal. The average left ventricular global longitudinal strain is -18.6 %. The global longitudinal strain is low normal.  2. Right ventricular systolic function is normal. The right ventricular size is normal.  3. The mitral valve is abnormal. Trivial mitral valve regurgitation.  4. The aortic valve is tricuspid. Aortic valve regurgitation is not visualized.  5. The inferior vena cava is normal in size with greater than 50% respiratory variability, suggesting right atrial pressure of 3 mmHg. Comparison(s): No prior Echocardiogram. FINDINGS  Left Ventricle: Left ventricular ejection fraction, by estimation, is 60 to 65%. The left ventricle has normal function. The left ventricle has no regional wall motion abnormalities. The average left ventricular global longitudinal strain is -18.6 %. The global longitudinal strain is normal. 3D left ventricular ejection fraction analysis performed but not reported based on interpreter judgement due to suboptimal quality. The left ventricular internal cavity size was normal in size. There is no left ventricular hypertrophy. Left ventricular diastolic parameters were normal. Indeterminate filling pressures. Right Ventricle: The right ventricular size is normal. No increase in right ventricular wall thickness. Right ventricular systolic function is normal. Left Atrium: Left atrial size was normal in size. Right Atrium: Right atrial size was normal in size. Pericardium: There is no evidence of pericardial effusion. Mitral Valve: The mitral valve is abnormal. There is mild calcification of the mitral valve leaflet(s). Trivial mitral valve regurgitation. Tricuspid Valve: The tricuspid valve is grossly  normal. Tricuspid valve regurgitation is trivial. Aortic Valve: The aortic valve is tricuspid. Aortic valve regurgitation is not visualized. Pulmonic Valve: The pulmonic valve was normal in structure. Pulmonic valve regurgitation is not visualized. Aorta: The aortic root and ascending aorta are structurally normal, with no evidence of dilitation. Venous: The inferior vena cava is normal in size with greater than 50% respiratory variability, suggesting right atrial pressure of 3 mmHg. IAS/Shunts: No atrial level shunt detected by color flow Doppler.  LEFT VENTRICLE PLAX 2D LVIDd:         3.80 cm  Diastology LVIDs:         2.50 cm  LV e' medial:    6.64 cm/s LV PW:         1.10 cm  LV E/e' medial:  14.6 LV IVS:        1.00 cm  LV e' lateral:   9.68 cm/s LVOT diam:     1.80 cm  LV E/e' lateral: 10.0 LV SV:         56 LV SV Index:   30       2D Longitudinal Strain LVOT Area:     2.54 cm 2D Strain GLS (A2C):   -21.1 %                         2D Strain GLS (A3C):   -18.1 %                         2D Strain GLS (A4C):   -16.6 %  2D Strain GLS Avg:     -18.6 %                          3D Volume EF:                         3D EF:        70 %                         LV EDV:       113 ml                         LV ESV:       34 ml                         LV SV:        79 ml RIGHT VENTRICLE             IVC RV Basal diam:  2.50 cm     IVC diam: 1.00 cm RV S prime:     11.10 cm/s TAPSE (M-mode): 1.8 cm LEFT ATRIUM             Index       RIGHT ATRIUM           Index LA diam:        3.90 cm 2.09 cm/m  RA Area:     11.50 cm LA Vol (A2C):   36.3 ml 19.45 ml/m RA Volume:   23.90 ml  12.80 ml/m LA Vol (A4C):   27.4 ml 14.68 ml/m LA Biplane Vol: 31.9 ml 17.09 ml/m  AORTIC VALVE LVOT Vmax:   80.20 cm/s LVOT Vmean:  60.200 cm/s LVOT VTI:    0.220 m  AORTA Ao Root diam: 2.50 cm Ao Asc diam:  2.80 cm MITRAL VALVE MV Area (PHT): 3.36 cm    SHUNTS MV Decel Time: 226 msec    Systemic VTI:  0.22 m MV E velocity:  96.70 cm/s  Systemic Diam: 1.80 cm MV A velocity: 77.10 cm/s MV E/A ratio:  1.25 Kenneth Hilty MD Electronically signed by Kenneth Hilty MD Signature Date/Time: 10/13/2020/9:31:45 AM    Final (Updated)    US AXILLARY NODE CORE BIOPSY LEFT  Addendum Date: 09/27/2020   ADDENDUM REPORT: 09/27/2020 12:02 ADDENDUM: Pathology revealed GRADE II INVASIVE DUCTAL CARCINOMA, DUCTAL CARCINOMA IN SITU of the LEFT breast, 3 o'clock. This was found to be concordant by Dr. Drew Davis. 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 This was found to be concordant by Dr. Drew Davis. Pathology results were discussed with the patient by telephone. The patient reported doing well after the biopsies with tenderness at the sites. Post biopsy instructions and care were reviewed and questions were answered. The patient was encouraged to call [facility] for any additional concerns. My direct phone number was provided. The patient has a recent diagnosis of BILATERAL breast cancer and was referred to [facility] on Oct 04, 2020. Recommendation for a bilateral breast MRI to exclude any additional sites of disease given age and breast density. There also appears to be mild skin thickening on the LEFT mammogram, recommend to evaluate clinically for inflammatory cancer. Pathology results reported by Lynne Bailey,   RN on 09/27/2020. Electronically Signed   By: Lovey Newcomer M.D.   On: 09/27/2020 12:02   Result Date: 09/27/2020 CLINICAL DATA:  Patient with suspicious left breast mass and cortically thickened left axillary lymph node. EXAM: ULTRASOUND GUIDED LEFT BREAST CORE NEEDLE BIOPSY COMPARISON:  Previous exam(s). PROCEDURE: I met with the patient and we discussed the procedure of ultrasound-guided biopsy, including benefits and alternatives. We discussed the high likelihood of a successful procedure. We discussed  the risks of the procedure, including infection, bleeding, tissue injury, clip migration, and inadequate sampling. Informed written consent was given. The usual time-out protocol was performed immediately prior to the procedure. Site 1: Left breast mass 3 o'clock position Lesion quadrant: Upper outer quadrant Using sterile technique and 1% Lidocaine as local anesthetic, under direct ultrasound visualization, a 14 gauge spring-loaded device was used to perform biopsy of left breast mass 3 o'clock position using a lateral approach. At the conclusion of the procedure heart shaped tissue marker clip was deployed into the biopsy cavity. Follow up 2 view mammogram was performed and dictated separately. Site 2: Left axillary lymph node Lesion quadrant: Upper outer quadrant Using sterile technique and 1% Lidocaine as local anesthetic, under direct ultrasound visualization, a 14 gauge spring-loaded device was used to perform biopsy of left axillary lymph node using a lateral approach. At the conclusion of the procedure tribell tissue marker clip was deployed into the biopsy cavity. Follow up 2 view mammogram was performed and dictated separately. IMPRESSION: Ultrasound guided biopsy of left breast mass and left axillary lymph node. No apparent complications. Electronically Signed: By: Lovey Newcomer M.D. On: 09/26/2020 15:24   MR TOTAL SPINE METS SCREENING  Result Date: 10/21/2020 CLINICAL DATA:  30 year old female with breast cancer. CT evidence of osseous metastatic disease. EXAM: MRI TOTAL SPINE WITHOUT AND WITH CONTRAST TECHNIQUE: Multisequence MR imaging of the spine from the cervical spine to the sacrum was performed prior to and following IV contrast administration for evaluation of spinal metastatic disease. CONTRAST:  64m GADAVIST GADOBUTROL 1 MMOL/ML IV SOLN COMPARISON:  Whole-body bone scan 10/13/2020.  CT Chest 10/13/2020. FINDINGS: MRI CERVICAL SPINE FINDINGS Alignment: Mild straightening of lordosis.  No  spondylolisthesis. Vertebrae: Mildly heterogeneous marrow signal, but no marrow edema or suspicious vertebral enhancement. Cord: Appears normal. No dural thickening or abnormal intradural enhancement identified. Posterior Fossa, vertebral arteries, paraspinal tissues: Cervicomedullary junction and visible brain parenchyma appear grossly negative. Negative visible paraspinal soft tissues. Disc levels: No significant degenerative changes.  No cervical spinal stenosis. MRI THORACIC SPINE FINDINGS Segmentation:  Normal. Alignment:  Relatively normal cervical lordosis. Vertebrae: Abnormal enhancement and marrow edema within the left T2 pars and spinous process (series 21 images 10 through 13 and series 25, images 10 and 12) corresponds to a lucent osseous lesion on the recent chest CT. Subtle small round enhancing T4 vertebral body metastasis also suspected on series 25, image 8. Subtle right T6 transverse process metastasis also suspected on series 25, image 3. Heterogeneously enhancing 10 mm metastasis in the anterior lower T10 body was heterogeneous on the recent CT. 11 mm right side T11 and subtle 6 mm left side T11 enhancing metastases were lytic on the comparison CT. And there is heterogeneous metastatic involvement of the T12 vertebra especially the spinous process (series 26, image 10). No extraosseous extension of tumor. Cord: No thoracic cord signal abnormality. No abnormal intradural enhancement or dural thickening. Conus medullaris appears normal at T12-L1. Paraspinal and other soft tissues: Negative. Disc levels: Multiple degenerative disc protrusions  in the thoracic spine at T6-T7 (left paracentral series 33, image 6), T7-T8 (left paracentral image 10), and T9-T10 (moderate right paracentral series 30, image 11 and series 33, image 23) result in mild degenerative spinal stenosis with up to mild cord mass effect at the latter. MRI LUMBAR SPINE FINDINGS Segmentation:  Normal. Alignment:  Preserved lumbar  lordosis. Vertebrae: Subtle anterior L2 vertebral body enhancing metastasis suspected on series 26, image 10, 7 mm. Small L4 posteroinferior 9 mm enhancing metastasis. Subtle L5 6-7 mm mildly enhancing metastasis (series 26, image 8 and series 22, image 8). No extraosseous extension of tumor. Conus medullaris: Extends to the T12-L1 level and appears normal. No abnormal intradural enhancement. No dural thickening. Paraspinal and other soft tissues: Negative. Disc levels: No significant degenerative changes. Other findings: Confluent sacral metastatic disease at the central S2 body and extending into the right sacral ala toward the right SI joint. See series 26, image 9 and series 22 images 3 through 9. IMPRESSION: 1. No cervical vertebral metastasis. Widely scattered thoracic and lumbar vertebral body and posterior element metastases. These are generally small, with no extraosseous or epidural space extension. Confluent sacral metastasis extends from the S2 body into the right sacral ala. 2. No spinal cord or cauda equina metastasis. No malignant spinal stenosis. 3. Multiple degenerative thoracic disc herniations, with mild degenerative spinal stenosis and mild spinal cord mass effect at the T9-T10 level, but no cord signal abnormality. Electronically Signed   By: H  Hall M.D.   On: 10/21/2020 10:28   MM CLIP PLACEMENT LEFT  Result Date: 09/26/2020 CLINICAL DATA:  Patient status post ultrasound-guided core needle biopsy left breast mass 3 o'clock position and cortically thickened left axillary lymph node. EXAM: DIAGNOSTIC LEFT MAMMOGRAM POST ULTRASOUND BIOPSY COMPARISON:  Previous exam(s). FINDINGS: Mammographic images were obtained following ultrasound guided biopsy of left breast mass 3 o'clock position and cortically thickened left axillary lymph node. Site 1: Left breast mass 3 o'clock position: Heart shaped clip: In appropriate position. Site 2: Left axillary lymph node: Tribell clip: In appropriate  position. Previously placed coil clip is redemonstrated. IMPRESSION: Appropriate positioning of the biopsy marking clips as above. Final Assessment: Post Procedure Mammograms for Marker Placement Electronically Signed   By: Drew  Davis M.D.   On: 09/26/2020 15:54   MM CLIP PLACEMENT LEFT  Result Date: 09/25/2020 CLINICAL DATA:  Evaluate biopsy markers EXAM: DIAGNOSTIC BILATERAL MAMMOGRAM POST STEREOTACTIC BIOPSY COMPARISON:  Previous exam(s). FINDINGS: Mammographic images were obtained following stereotactic guided biopsy of bilateral breast calcifications. The biopsy marking clips are in expected position at the site of biopsy. IMPRESSION: The X shaped clip is in the approximate location of the biopsied right breast calcifications. The coil shaped clip is in the region of the biopsied left breast calcifications. Final Assessment: Post Procedure Mammograms for Marker Placement Electronically Signed   By: David  Williams III M.D   On: 09/25/2020 10:04   MM CLIP PLACEMENT RIGHT  Result Date: 09/25/2020 CLINICAL DATA:  Evaluate biopsy markers EXAM: DIAGNOSTIC BILATERAL MAMMOGRAM POST STEREOTACTIC BIOPSY COMPARISON:  Previous exam(s). FINDINGS: Mammographic images were obtained following stereotactic guided biopsy of bilateral breast calcifications. The biopsy marking clips are in expected position at the site of biopsy. IMPRESSION: The X shaped clip is in the approximate location of the biopsied right breast calcifications. The coil shaped clip is in the region of the biopsied left breast calcifications. Final Assessment: Post Procedure Mammograms for Marker Placement Electronically Signed   By: David  Williams III M.D     On: 09/25/2020 10:04   [place] LT BREAST BX W LOC DEV 1ST LESION IMG BX SPEC US GUIDE  Addendum Date: 09/27/2020   ADDENDUM REPORT: 09/27/2020 12:02 ADDENDUM: Pathology revealed GRADE II INVASIVE DUCTAL CARCINOMA, DUCTAL CARCINOMA IN SITU of the LEFT breast, 3 o'clock. This was found to be  concordant by Dr. Lovey Newcomer. 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 This was found to be concordant by Dr. Lovey Newcomer. Pathology results were discussed with the patient by telephone. The patient reported doing well after the biopsies with tenderness at the sites. Post biopsy instructions and care were reviewed and questions were answered. The patient was encouraged to call [facility] for any additional concerns. My direct phone number was provided. The patient has a recent diagnosis of BILATERAL breast cancer and was referred to [facility] on Oct 04, 2020. Recommendation for a bilateral breast MRI to exclude any additional sites of disease given age and breast density. There also appears to be mild skin thickening on the LEFT mammogram, recommend to evaluate clinically for inflammatory cancer. Pathology results reported by Terie Purser, RN on 09/27/2020. Electronically Signed   By: Lovey Newcomer M.D.   On: 09/27/2020 12:02   Result Date: 09/27/2020 CLINICAL DATA:  Patient with suspicious left breast mass and cortically thickened left axillary lymph node. EXAM: ULTRASOUND GUIDED LEFT BREAST CORE NEEDLE BIOPSY COMPARISON:  Previous exam(s). PROCEDURE: I met with the patient and we discussed the procedure of ultrasound-guided biopsy, including benefits and alternatives. We discussed the high likelihood of a successful procedure. We discussed the risks of the procedure, including infection, bleeding, tissue injury, clip migration, and inadequate sampling. Informed written consent was given. The usual time-out protocol was performed immediately prior to the procedure. Site 1: Left breast mass 3 o'clock position Lesion quadrant: Upper outer quadrant Using sterile technique and 1% Lidocaine as local anesthetic, under direct ultrasound visualization, a 14 gauge spring-loaded device was  used to perform biopsy of left breast mass 3 o'clock position using a lateral approach. At the conclusion of the procedure heart shaped tissue marker clip was deployed into the biopsy cavity. Follow up 2 view mammogram was performed and dictated separately. Site 2: Left axillary lymph node Lesion quadrant: Upper outer quadrant Using sterile technique and 1% Lidocaine as local anesthetic, under direct ultrasound visualization, a 14 gauge spring-loaded device was used to perform biopsy of left axillary lymph node using a lateral approach. At the conclusion of the procedure tribell tissue marker clip was deployed into the biopsy cavity. Follow up 2 view mammogram was performed and dictated separately. IMPRESSION: Ultrasound guided biopsy of left breast mass and left axillary lymph node. No apparent complications. Electronically Signed: By: Lovey Newcomer M.D. On: 09/26/2020 15:24   MM RT BREAST BX W LOC DEV 1ST LESION IMAGE BX SPEC STEREO GUIDE  Addendum Date: 09/27/2020   ADDENDUM REPORT: 09/26/2020 15:01 ADDENDUM: Pathology revealed GRADE II INVASIVE DUCTAL CARCINOMA, DUCTAL CARCINOMA IN SITU WITH CALCIFICATIONS AND FOCAL NECROSIS of the LEFT breast, upper outer. This was found to be concordant by Dr. Dorise Bullion. 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 This was found to be concordant by Dr. Dorise Bullion. Pathology results were discussed with the patient by telephone. The patient reported doing well after the biopsies with tenderness at the sites. Post biopsy instructions and care were reviewed and questions  were answered. The patient was encouraged to call [facility] for any additional concerns. My direct phone number was provided. The patient was referred to [facility] on Oct 05, 2019. The patient is scheduled for a LEFT breast and LEFT axillary node [facility]. Further recommendations will be guided by the results of these biopsies. Recommendation for a bilateral breast MRI to exclude any additional sites of disease given age and breast density. There also appears to be mild skin thickening on the LEFT mammogram, recommend to evaluate clinically for inflammatory cancer. Pathology results reported by Lynne Bailey, RN on 09/26/2020. Electronically Signed   By: David  Williams III M.D   On: 09/26/2020 15:01   Result Date: 09/27/2020 CLINICAL DATA:  Biopsy of bilateral breast calcifications EXAM: BREAST STEREOTACTIC CORE NEEDLE BIOPSY COMPARISON:  Previous exams. FINDINGS: The patient and I discussed the procedure of stereotactic-guided biopsy including benefits and alternatives. We discussed the high likelihood of a successful procedure. We discussed the risks of the procedure including infection, bleeding, tissue injury, clip migration, and inadequate sampling. Informed written consent was given. The usual time out protocol was performed immediately prior to the procedure. Using sterile technique and 1% Lidocaine as local anesthetic, under stereotactic guidance, a 9 gauge vacuum assisted device was used to perform core needle biopsy of calcifications in the upper-outer left breast using a superior approach. Specimen radiograph was performed showing calcifications in several core specimens. Specimens with calcifications are identified for pathology. Lesion quadrant: Upper outer left breast At the conclusion of the procedure, a coil shaped tissue marker clip was deployed into the biopsy cavity. Follow-up 2-view mammogram was performed and dictated separately. Using sterile technique and 1% Lidocaine as local anesthetic, under stereotactic guidance, a 9 gauge vacuum assisted device was used to perform core needle biopsy of calcifications in the upper outer right breast using a superior approach. Specimen radiograph was performed showing  calcifications in several core specimens. Specimens with calcifications are identified for pathology. Lesion quadrant: Upper outer right breast At the conclusion of the procedure, an X shaped tissue marker clip was deployed into the biopsy cavity. Follow-up 2-view mammogram was performed and dictated separately. IMPRESSION: Stereotactic-guided biopsy of calcifications in the upper-outer left breast and the upper outer right breast. No apparent complications. Electronically Signed: By: David  Williams III M.D On: 09/25/2020 09:42     ELIGIBLE FOR AVAILABLE RESEARCH PROTOCOL:   ASSESSMENT: 29 y.o. 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 as follows:   (0) status post left breast biopsies x2 and left axilla lymph node biopsy 09/26/2020 for a clinical T3 N1(f), stage IIa invasive ductal carcinoma, grade 2, estrogen and progesterone receptor positive, HER2 not amplified, with an MIB-1 in the 10-15% range  (a) right breast biopsy 09/25/2020 showed ductal carcinoma in situ, grade 1, estrogen and progesterone receptor positive  (b) breast MRI 10/06/2020 shows a 11 cm area of malignancy, with a second 1.9 cm mass, and skin thickening concerning for inflammatory breast carcinoma; at least 6 abnormal lymph nodes noted  (c) chest CT scan 10/13/2020 shows clear lungs and no obvious liver involvement but multiple lytic bone lesions in addition to the findings and MRI above  (d) bone scan 10/13/2020 shows nonspecific findings (lytic lesions not expected to be "hot")  (e) total spinal MRI 10/21/2020 shows widely scattered thoracic and lumbar vertebral body metastases,   with no extraosseous or epidural space extension, also confluent sacral metastases, but no spinal cord or cauda equina metastases and no malignant spinal stenosis.  No cervical vertebral metastases noted.  There is also degenerative disc disease.  (1) genetics testing  (2) neoadjuvant chemotherapy consisting of  cyclophosphamide and doxorubicin in dose dense fashion x4 starting 10/24/2020, possibly followed by paclitaxel weekly x12  (a) echo 10/13/2020 showed an ejection fraction in the 60-65% range  (3) definitive surgery pending  (4) adjuvant radiation to the left side to follow  (5) antiestrogens to follow at the completion of local treatment  (a) goserelin started 10/11/2020  (6) zolendronate to start 11/02/2018  PLAN: I reviewed all the staging studies today with Gabriela Sullivan.  She understands she has stage IV disease, but this is not curable, but that it can be treated and controlled.  We are going to start with chemotherapy in an attempt to shrink the cancer to facilitate her modified radical mastectomy which she will need on the left.  If she carries a genetic mutation she may want bilateral mastectomies of course  Dr. Brantley Stage came in and examined the patient with me and he agrees that we are dealing with an inflammatory cancer.  He is hoping to be able to perform the MRM after the 4 cycles of cyclophosphamide and doxorubicin planned.  Yared understands that reconstruction will be delayed as she will likely need radiation to the left side as well  She has a good understanding of how to take her supportive medications.  Today we also discussed zoledronate.  I have written for her to receive that when she returns to see me 11/01/2020.  At that time we will review her tolerance of this for cycle and make any changes needed to facilitate her next 3 cycles.  She knows to call for any issue that may develop before that visit  Total encounter time 35 minutes.Sarajane Jews C. Valari Taylor, MD 10/25/2020 9:20 AM Medical Oncology and Hematology [facility] [place], [place] [address] Tel. [phone]    Fax. [phone]   This document serves as a record of services personally performed by Lurline Del, MD. It was created on his behalf by Wilburn Mylar, a trained  medical scribe. The creation of this record is based on the scribe's personal observations and the provider's statements to them.   I, Lurline Del MD, have reviewed the above documentation for accuracy and completeness, and I agree with the above.    *Total Encounter Time as defined by the Centers for Medicare and Medicaid Services includes, in addition to the face-to-face time of a patient visit (documented in the note above) non-face-to-face time: obtaining and reviewing outside history, ordering and reviewing medications, tests or procedures, care coordination (communications with other health care professionals or caregivers) and documentation in the medical record.

## 2020-10-25 ENCOUNTER — Inpatient Hospital Stay: Payer: Medicaid Other

## 2020-10-25 ENCOUNTER — Encounter: Payer: Self-pay | Admitting: *Deleted

## 2020-10-25 ENCOUNTER — Telehealth: Payer: Self-pay | Admitting: Oncology

## 2020-10-25 ENCOUNTER — Other Ambulatory Visit: Payer: Self-pay

## 2020-10-25 ENCOUNTER — Telehealth: Payer: Self-pay | Admitting: *Deleted

## 2020-10-25 ENCOUNTER — Inpatient Hospital Stay (HOSPITAL_BASED_OUTPATIENT_CLINIC_OR_DEPARTMENT_OTHER): Payer: Medicaid Other | Admitting: Oncology

## 2020-10-25 ENCOUNTER — Other Ambulatory Visit: Payer: Self-pay | Admitting: Oncology

## 2020-10-25 VITALS — BP 144/99 | HR 76 | Temp 97.7°F | Resp 18 | Ht 60.0 in | Wt 198.9 lb

## 2020-10-25 DIAGNOSIS — Z95828 Presence of other vascular implants and grafts: Secondary | ICD-10-CM

## 2020-10-25 DIAGNOSIS — C50412 Malignant neoplasm of upper-outer quadrant of left female breast: Secondary | ICD-10-CM

## 2020-10-25 DIAGNOSIS — Z7189 Other specified counseling: Secondary | ICD-10-CM | POA: Insufficient documentation

## 2020-10-25 DIAGNOSIS — M899 Disorder of bone, unspecified: Secondary | ICD-10-CM

## 2020-10-25 DIAGNOSIS — Z170 Estrogen receptor positive status [ER+]: Secondary | ICD-10-CM

## 2020-10-25 LAB — CBC WITH DIFFERENTIAL/PLATELET
Abs Immature Granulocytes: 0.02 10*3/uL (ref 0.00–0.07)
Basophils Absolute: 0 10*3/uL (ref 0.0–0.1)
Basophils Relative: 0 %
Eosinophils Absolute: 0.2 10*3/uL (ref 0.0–0.5)
Eosinophils Relative: 4 %
HCT: 41.9 % (ref 36.0–46.0)
Hemoglobin: 14 g/dL (ref 12.0–15.0)
Immature Granulocytes: 0 %
Lymphocytes Relative: 36 %
Lymphs Abs: 2.4 10*3/uL (ref 0.7–4.0)
MCH: 27.8 pg (ref 26.0–34.0)
MCHC: 33.4 g/dL (ref 30.0–36.0)
MCV: 83.1 fL (ref 80.0–100.0)
Monocytes Absolute: 0.5 10*3/uL (ref 0.1–1.0)
Monocytes Relative: 7 %
Neutro Abs: 3.7 10*3/uL (ref 1.7–7.7)
Neutrophils Relative %: 53 %
Platelets: 348 10*3/uL (ref 150–400)
RBC: 5.04 MIL/uL (ref 3.87–5.11)
RDW: 12.8 % (ref 11.5–15.5)
WBC: 6.8 10*3/uL (ref 4.0–10.5)
nRBC: 0 % (ref 0.0–0.2)

## 2020-10-25 LAB — COMPREHENSIVE METABOLIC PANEL
ALT: 117 U/L — ABNORMAL HIGH (ref 0–44)
AST: 50 U/L — ABNORMAL HIGH (ref 15–41)
Albumin: 4.1 g/dL (ref 3.5–5.0)
Alkaline Phosphatase: 68 U/L (ref 38–126)
Anion gap: 9 (ref 5–15)
BUN: 18 mg/dL (ref 6–20)
CO2: 24 mmol/L (ref 22–32)
Calcium: 9.2 mg/dL (ref 8.9–10.3)
Chloride: 107 mmol/L (ref 98–111)
Creatinine, Ser: 0.58 mg/dL (ref 0.44–1.00)
GFR, Estimated: >60 mL/min (ref >60)
Glucose, Bld: 131 mg/dL — ABNORMAL HIGH (ref 70–99)
Potassium: 3.8 mmol/L (ref 3.5–5.1)
Sodium: 140 mmol/L (ref 135–145)
Total Bilirubin: 0.4 mg/dL (ref 0.3–1.2)
Total Protein: 7.5 g/dL (ref 6.5–8.1)

## 2020-10-25 MED ORDER — LIDOCAINE-PRILOCAINE 2.5-2.5 % EX CREA: TOPICAL_CREAM | CUTANEOUS | 3 refills | Status: DC

## 2020-10-25 MED ORDER — SODIUM CHLORIDE 0.9 % IV SOLN
600.0000 mg/m2 | Freq: Once | INTRAVENOUS | Status: AC
  Administered 2020-10-25: 1180 mg via INTRAVENOUS
  Filled 2020-10-25: qty 59

## 2020-10-25 MED ORDER — SODIUM CHLORIDE 0.9 % IV SOLN
10.0000 mg | Freq: Once | INTRAVENOUS | Status: AC
  Administered 2020-10-25: 10 mg via INTRAVENOUS
  Filled 2020-10-25: qty 10

## 2020-10-25 MED ORDER — PALONOSETRON HCL INJECTION 0.25 MG/5ML
0.2500 mg | Freq: Once | INTRAVENOUS | Status: AC
  Administered 2020-10-25: 0.25 mg via INTRAVENOUS

## 2020-10-25 MED ORDER — HEPARIN SOD (PORK) LOCK FLUSH 100 UNIT/ML IV SOLN
500.0000 [IU] | Freq: Once | INTRAVENOUS | Status: AC | PRN
  Administered 2020-10-25: 500 [IU]
  Filled 2020-10-25: qty 5

## 2020-10-25 MED ORDER — PALONOSETRON HCL INJECTION 0.25 MG/5ML
INTRAVENOUS | Status: AC
  Filled 2020-10-25: qty 5

## 2020-10-25 MED ORDER — DOXORUBICIN HCL CHEMO IV INJECTION 2 MG/ML
60.0000 mg/m2 | Freq: Once | INTRAVENOUS | Status: AC
  Administered 2020-10-25: 118 mg via INTRAVENOUS
  Filled 2020-10-25: qty 59

## 2020-10-25 MED ORDER — SODIUM CHLORIDE 0.9% FLUSH
10.0000 mL | INTRAVENOUS | Status: DC | PRN
Start: 2020-10-25 — End: 2020-10-25
  Administered 2020-10-25: 10 mL via INTRAVENOUS
  Filled 2020-10-25: qty 10

## 2020-10-25 MED ORDER — SODIUM CHLORIDE 0.9% FLUSH
10.0000 mL | INTRAVENOUS | Status: DC | PRN
  Administered 2020-10-25: 10 mL
  Filled 2020-10-25: qty 10

## 2020-10-25 MED ORDER — SODIUM CHLORIDE 0.9 % IV SOLN
Freq: Once | INTRAVENOUS | Status: AC
  Filled 2020-10-25: qty 250

## 2020-10-25 MED ORDER — SODIUM CHLORIDE 0.9 % IV SOLN
150.0000 mg | Freq: Once | INTRAVENOUS | Status: AC
  Administered 2020-10-25: 150 mg via INTRAVENOUS
  Filled 2020-10-25: qty 150

## 2020-10-25 NOTE — Patient Instructions (Signed)
[facility]  Discharge Instructions: Thank you for choosing [facility] to provide your oncology and hematology care.   If you have a lab appointment with the [facility], please go directly to the [facility] and check in at the registration area.   Wear comfortable clothing and clothing appropriate for easy access to any Portacath or PICC line.   We strive to give you quality time with your provider. You may need to reschedule your appointment if you arrive late (15 or more minutes).  Arriving late affects you and other patients whose appointments are after yours.  Also, if you miss three or more appointments without notifying the office, you may be dismissed from the clinic at the provider's discretion.      For prescription refill requests, have your pharmacy contact our office and allow 72 hours for refills to be completed.    Today you received the following chemotherapy and/or immunotherapy agents: Adriamycin, Cytoxan     To help prevent nausea and vomiting after your treatment, we encourage you to take your nausea medication as directed.  BELOW ARE SYMPTOMS THAT SHOULD BE REPORTED IMMEDIATELY: . *FEVER GREATER THAN 100.4 F (38 C) OR HIGHER . *CHILLS OR SWEATING . *NAUSEA AND VOMITING THAT IS NOT CONTROLLED WITH YOUR NAUSEA MEDICATION . *UNUSUAL SHORTNESS OF BREATH . *UNUSUAL BRUISING OR BLEEDING . *URINARY PROBLEMS (pain or burning when urinating, or frequent urination) . *BOWEL PROBLEMS (unusual diarrhea, constipation, pain near the anus) . TENDERNESS IN MOUTH AND THROAT WITH OR WITHOUT PRESENCE OF ULCERS (sore throat, sores in mouth, or a toothache) . UNUSUAL RASH, SWELLING OR PAIN  . UNUSUAL VAGINAL DISCHARGE OR ITCHING   Items with * indicate a potential emergency and should be followed up as soon as possible or go to the Emergency Department if any problems should occur.  Please show the CHEMOTHERAPY ALERT CARD or  IMMUNOTHERAPY ALERT CARD at check-in to the Emergency Department and triage nurse.  Should you have questions after your visit or need to cancel or reschedule your appointment, please contact [facility]  Dept: [phone]  and follow the prompts.  Office hours are 8:00 a.m. to 4:30 p.m. Monday - Friday. Please note that voicemails left after 4:00 p.m. may not be returned until the following business day.  We are closed weekends and major holidays. You have access to a nurse at all times for urgent questions. Please call the main number to the clinic Dept: [phone] and follow the prompts.   For any non-urgent questions, you may also contact your provider using MyChart. We now offer e-Visits for anyone 72 and older to request care online for non-urgent symptoms. For details visit [email or web address].   Also download the MyChart app! Go to the app store, search "MyChart", open the app, select [facility], and log in with your MyChart username and password.  Due to Covid, a mask is required upon entering the hospital/clinic. If you do not have a mask, one will be given to you upon arrival. For doctor visits, patients may have 1 support person aged 28 or older with them. For treatment visits, patients cannot have anyone with them due to current Covid guidelines and our immunocompromised population.   Doxorubicin injection What is this medicine? DOXORUBICIN (dox oh ROO bi sin) is a chemotherapy drug. It is used to treat many kinds of cancer like leukemia, lymphoma, neuroblastoma, sarcoma, and Wilms' tumor. It is also used to treat  bladder cancer, breast cancer, lung cancer, ovarian cancer, stomach cancer, and thyroid cancer. This medicine may be used for other purposes; ask your health care provider or pharmacist if you have questions. COMMON BRAND NAME(S): Adriamycin, Adriamycin PFS, Adriamycin RDF, Rubex What should I tell my health care provider before I take this  medicine? They need to know if you have any of these conditions:  heart disease  history of low blood counts caused by a medicine  liver disease  recent or ongoing radiation therapy  an unusual or allergic reaction to doxorubicin, other chemotherapy agents, other medicines, foods, dyes, or preservatives  pregnant or trying to get pregnant  breast-feeding How should I use this medicine? This drug is given as an infusion into a vein. It is administered in a hospital or clinic by a specially trained health care professional. If you have pain, swelling, burning or any unusual feeling around the site of your injection, tell your health care professional right away. Talk to your pediatrician regarding the use of this medicine in children. Special care may be needed. Overdosage: If you think you have taken too much of this medicine contact a poison control center or emergency room at once. NOTE: This medicine is only for you. Do not share this medicine with others. What if I miss a dose? It is important not to miss your dose. Call your doctor or health care professional if you are unable to keep an appointment. What may interact with this medicine? This medicine may interact with the following medications:  6-mercaptopurine  paclitaxel  phenytoin  St. John's Wort  trastuzumab  verapamil This list may not describe all possible interactions. Give your health care provider a list of all the medicines, herbs, non-prescription drugs, or dietary supplements you use. Also tell them if you smoke, drink alcohol, or use illegal drugs. Some items may interact with your medicine. What should I watch for while using this medicine? This drug may make you feel generally unwell. This is not uncommon, as chemotherapy can affect healthy cells as well as cancer cells. Report any side effects. Continue your course of treatment even though you feel ill unless your doctor tells you to stop. There is a  maximum amount of this medicine you should receive throughout your life. The amount depends on the medical condition being treated and your overall health. Your doctor will watch how much of this medicine you receive in your lifetime. Tell your doctor if you have taken this medicine before. You may need blood work done while you are taking this medicine. Your urine may turn red for a few days after your dose. This is not blood. If your urine is dark or brown, call your doctor. In some cases, you may be given additional medicines to help with side effects. Follow all directions for their use. Call your doctor or health care professional for advice if you get a fever, chills or sore throat, or other symptoms of a cold or flu. Do not treat yourself. This drug decreases your body's ability to fight infections. Try to avoid being around people who are sick. This medicine may increase your risk to bruise or bleed. Call your doctor or health care professional if you notice any unusual bleeding. Talk to your doctor about your risk of cancer. You may be more at risk for certain types of cancers if you take this medicine. Do not become pregnant while taking this medicine or for 6 months after stopping it. Women should  inform their doctor if they wish to become pregnant or think they might be pregnant. Men should not father a child while taking this medicine and for 6 months after stopping it. There is a potential for serious side effects to an unborn child. Talk to your health care professional or pharmacist for more information. Do not breast-feed an infant while taking this medicine. This medicine has caused ovarian failure in some women and reduced sperm counts in some men This medicine may interfere with the ability to have a child. Talk with your doctor or health care professional if you are concerned about your fertility. This medicine may cause a decrease in Co-Enzyme Q-10. You should make sure that you get  enough Co-Enzyme Q-10 while you are taking this medicine. Discuss the foods you eat and the vitamins you take with your health care professional. What side effects may I notice from receiving this medicine? Side effects that you should report to your doctor or health care professional as soon as possible:  allergic reactions like skin rash, itching or hives, swelling of the face, lips, or tongue  breathing problems  chest pain  fast or irregular heartbeat  low blood counts - this medicine may decrease the number of white blood cells, red blood cells and platelets. You may be at increased risk for infections and bleeding.  pain, redness, or irritation at site where injected  signs of infection - fever or chills, cough, sore throat, pain or difficulty passing urine  signs of decreased platelets or bleeding - bruising, pinpoint red spots on the skin, black, tarry stools, blood in the urine  swelling of the ankles, feet, hands  tiredness  weakness Side effects that usually do not require medical attention (report to your doctor or health care professional if they continue or are bothersome):  diarrhea  hair loss  mouth sores  nail discoloration or damage  nausea  red colored urine  vomiting This list may not describe all possible side effects. Call your doctor for medical advice about side effects. You may report side effects to FDA at 1-800-FDA-1088. Where should I keep my medicine? This drug is given in a hospital or clinic and will not be stored at home. NOTE: This sheet is a summary. It may not cover all possible information. If you have questions about this medicine, talk to your doctor, pharmacist, or health care provider.  2021 Elsevier/Gold Standard (2017-01-01 11:01:26)  Cyclophosphamide Injection What is this medicine? CYCLOPHOSPHAMIDE (sye kloe FOSS fa mide) is a chemotherapy drug. It slows the growth of cancer cells. This medicine is used to treat many types  of cancer like lymphoma, myeloma, leukemia, breast cancer, and ovarian cancer, to name a few. This medicine may be used for other purposes; ask your health care provider or pharmacist if you have questions. COMMON BRAND NAME(S): Cytoxan, Neosar What should I tell my health care provider before I take this medicine? They need to know if you have any of these conditions:  heart disease  history of irregular heartbeat  infection  kidney disease  liver disease  low blood counts, like white cells, platelets, or red blood cells  on hemodialysis  recent or ongoing radiation therapy  scarring or thickening of the lungs  trouble passing urine  an unusual or allergic reaction to cyclophosphamide, other medicines, foods, dyes, or preservatives  pregnant or trying to get pregnant  breast-feeding How should I use this medicine? This drug is usually given as an injection into a  vein or muscle or by infusion into a vein. It is administered in a hospital or clinic by a specially trained health care professional. Talk to your pediatrician regarding the use of this medicine in children. Special care may be needed. Overdosage: If you think you have taken too much of this medicine contact a poison control center or emergency room at once. NOTE: This medicine is only for you. Do not share this medicine with others. What if I miss a dose? It is important not to miss your dose. Call your doctor or health care professional if you are unable to keep an appointment. What may interact with this medicine?  amphotericin B  azathioprine  certain antivirals for HIV or hepatitis  certain medicines for blood pressure, heart disease, irregular heart beat  certain medicines that treat or prevent blood clots like warfarin  certain other medicines for cancer  cyclosporine  etanercept  indomethacin  medicines that relax muscles for surgery  medicines to increase blood  counts  metronidazole This list may not describe all possible interactions. Give your health care provider a list of all the medicines, herbs, non-prescription drugs, or dietary supplements you use. Also tell them if you smoke, drink alcohol, or use illegal drugs. Some items may interact with your medicine. What should I watch for while using this medicine? Your condition will be monitored carefully while you are receiving this medicine. You may need blood work done while you are taking this medicine. Drink water or other fluids as directed. Urinate often, even at night. Some products may contain alcohol. Ask your health care professional if this medicine contains alcohol. Be sure to tell all health care professionals you are taking this medicine. Certain medicines, like metronidazole and disulfiram, can cause an unpleasant reaction when taken with alcohol. The reaction includes flushing, headache, nausea, vomiting, sweating, and increased thirst. The reaction can last from 30 minutes to several hours. Do not become pregnant while taking this medicine or for 1 year after stopping it. Women should inform their health care professional if they wish to become pregnant or think they might be pregnant. Men should not father a child while taking this medicine and for 4 months after stopping it. There is potential for serious side effects to an unborn child. Talk to your health care professional for more information. Do not breast-feed an infant while taking this medicine or for 1 week after stopping it. This medicine has caused ovarian failure in some women. This medicine may make it more difficult to get pregnant. Talk to your health care professional if you are concerned about your fertility. This medicine has caused decreased sperm counts in some men. This may make it more difficult to father a child. Talk to your health care professional if you are concerned about your fertility. Call your health care  professional for advice if you get a fever, chills, or sore throat, or other symptoms of a cold or flu. Do not treat yourself. This medicine decreases your body's ability to fight infections. Try to avoid being around people who are sick. Avoid taking medicines that contain aspirin, acetaminophen, ibuprofen, naproxen, or ketoprofen unless instructed by your health care professional. These medicines may hide a fever. Talk to your health care professional about your risk of cancer. You may be more at risk for certain types of cancer if you take this medicine. If you are going to need surgery or other procedure, tell your health care professional that you are using this medicine.  Be careful brushing or flossing your teeth or using a toothpick because you may get an infection or bleed more easily. If you have any dental work done, tell your dentist you are receiving this medicine. What side effects may I notice from receiving this medicine? Side effects that you should report to your doctor or health care professional as soon as possible:  allergic reactions like skin rash, itching or hives, swelling of the face, lips, or tongue  breathing problems  nausea, vomiting  signs and symptoms of bleeding such as bloody or black, tarry stools; red or dark brown urine; spitting up blood or brown material that looks like coffee grounds; red spots on the skin; unusual bruising or bleeding from the eyes, gums, or nose  signs and symptoms of heart failure like fast, irregular heartbeat, sudden weight gain; swelling of the ankles, feet, hands  signs and symptoms of infection like fever; chills; cough; sore throat; pain or trouble passing urine  signs and symptoms of kidney injury like trouble passing urine or change in the amount of urine  signs and symptoms of liver injury like dark yellow or brown urine; general ill feeling or flu-like symptoms; light-colored stools; loss of appetite; nausea; right upper belly  pain; unusually weak or tired; yellowing of the eyes or skin Side effects that usually do not require medical attention (report to your doctor or health care professional if they continue or are bothersome):  confusion  decreased hearing  diarrhea  facial flushing  hair loss  headache  loss of appetite  missed menstrual periods  signs and symptoms of low red blood cells or anemia such as unusually weak or tired; feeling faint or lightheaded; falls  skin discoloration This list may not describe all possible side effects. Call your doctor for medical advice about side effects. You may report side effects to FDA at 1-800-FDA-1088. Where should I keep my medicine? This drug is given in a hospital or clinic and will not be stored at home. NOTE: This sheet is a summary. It may not cover all possible information. If you have questions about this medicine, talk to your doctor, pharmacist, or health care provider.  2021 Elsevier/Gold Standard (2019-02-22 09:53:29)

## 2020-10-25 NOTE — Telephone Encounter (Signed)
Ok to treat with elevated ALT per MD review - informed treatment room nurse.

## 2020-10-25 NOTE — Telephone Encounter (Signed)
Scheduled appts per 5/25 sch msg. Will have updated calendar printed for pt in infusion today.

## 2020-10-26 ENCOUNTER — Telehealth: Payer: Self-pay | Admitting: *Deleted

## 2020-10-26 ENCOUNTER — Other Ambulatory Visit: Payer: Self-pay | Admitting: Oncology

## 2020-10-27 ENCOUNTER — Inpatient Hospital Stay: Payer: Medicaid Other

## 2020-10-27 ENCOUNTER — Other Ambulatory Visit: Payer: Self-pay

## 2020-10-27 VITALS — BP 137/77 | HR 69 | Temp 98.3°F | Resp 18

## 2020-10-27 DIAGNOSIS — Z170 Estrogen receptor positive status [ER+]: Secondary | ICD-10-CM

## 2020-10-27 DIAGNOSIS — C50412 Malignant neoplasm of upper-outer quadrant of left female breast: Secondary | ICD-10-CM | POA: Diagnosis not present

## 2020-10-27 MED ORDER — PEGFILGRASTIM-CBQV 6 MG/0.6ML ~~LOC~~ SOSY
6.0000 mg | PREFILLED_SYRINGE | Freq: Once | SUBCUTANEOUS | Status: AC
  Administered 2020-10-27: 6 mg via SUBCUTANEOUS

## 2020-10-27 MED ORDER — PEGFILGRASTIM-CBQV 6 MG/0.6ML ~~LOC~~ SOSY
PREFILLED_SYRINGE | SUBCUTANEOUS | Status: AC
  Filled 2020-10-27: qty 0.6

## 2020-10-27 NOTE — Patient Instructions (Signed)
Pegfilgrastim injection What is this medicine? PEGFILGRASTIM (PEG fil gra stim) is a long-acting granulocyte colony-stimulating factor that stimulates the growth of neutrophils, a type of white blood cell important in the body's fight against infection. It is used to reduce the incidence of fever and infection in patients with certain types of cancer who are receiving chemotherapy that affects the bone marrow, and to increase survival after being exposed to high doses of radiation. This medicine may be used for other purposes; ask your health care provider or pharmacist if you have questions. COMMON BRAND NAME(S): Fulphila, Neulasta, Nyvepria, UDENYCA, Ziextenzo What should I tell my health care provider before I take this medicine? They need to know if you have any of these conditions:  kidney disease  latex allergy  ongoing radiation therapy  sickle cell disease  skin reactions to acrylic adhesives (On-Body Injector only)  an unusual or allergic reaction to pegfilgrastim, filgrastim, other medicines, foods, dyes, or preservatives  pregnant or trying to get pregnant  breast-feeding How should I use this medicine? This medicine is for injection under the skin. If you get this medicine at home, you will be taught how to prepare and give the pre-filled syringe or how to use the On-body Injector. Refer to the patient Instructions for Use for detailed instructions. Use exactly as directed. Tell your healthcare provider immediately if you suspect that the On-body Injector may not have performed as intended or if you suspect the use of the On-body Injector resulted in a missed or partial dose. It is important that you put your used needles and syringes in a special sharps container. Do not put them in a trash can. If you do not have a sharps container, call your pharmacist or healthcare provider to get one. Talk to your pediatrician regarding the use of this medicine in children. While this drug  may be prescribed for selected conditions, precautions do apply. Overdosage: If you think you have taken too much of this medicine contact a poison control center or emergency room at once. NOTE: This medicine is only for you. Do not share this medicine with others. What if I miss a dose? It is important not to miss your dose. Call your doctor or health care professional if you miss your dose. If you miss a dose due to an On-body Injector failure or leakage, a new dose should be administered as soon as possible using a single prefilled syringe for manual use. What may interact with this medicine? Interactions have not been studied. This list may not describe all possible interactions. Give your health care provider a list of all the medicines, herbs, non-prescription drugs, or dietary supplements you use. Also tell them if you smoke, drink alcohol, or use illegal drugs. Some items may interact with your medicine. What should I watch for while using this medicine? Your condition will be monitored carefully while you are receiving this medicine. You may need blood work done while you are taking this medicine. Talk to your health care provider about your risk of cancer. You may be more at risk for certain types of cancer if you take this medicine. If you are going to need a MRI, CT scan, or other procedure, tell your doctor that you are using this medicine (On-Body Injector only). What side effects may I notice from receiving this medicine? Side effects that you should report to your doctor or health care professional as soon as possible:  allergic reactions (skin rash, itching or hives, swelling of   the face, lips, or tongue)  back pain  dizziness  fever  pain, redness, or irritation at site where injected  pinpoint red spots on the skin  red or dark-brown urine  shortness of breath or breathing problems  stomach or side pain, or pain at the shoulder  swelling  tiredness  trouble  passing urine or change in the amount of urine  unusual bruising or bleeding Side effects that usually do not require medical attention (report to your doctor or health care professional if they continue or are bothersome):  bone pain  muscle pain This list may not describe all possible side effects. Call your doctor for medical advice about side effects. You may report side effects to FDA at 1-800-FDA-1088. Where should I keep my medicine? Keep out of the reach of children. If you are using this medicine at home, you will be instructed on how to store it. Throw away any unused medicine after the expiration date on the label. NOTE: This sheet is a summary. It may not cover all possible information. If you have questions about this medicine, talk to your doctor, pharmacist, or health care provider.  2021 Elsevier/Gold Standard (2019-06-11 13:20:51)  

## 2020-11-01 ENCOUNTER — Inpatient Hospital Stay: Payer: Medicaid Other | Attending: Oncology | Admitting: Oncology

## 2020-11-01 ENCOUNTER — Encounter: Payer: Self-pay | Admitting: *Deleted

## 2020-11-01 ENCOUNTER — Other Ambulatory Visit: Payer: Self-pay

## 2020-11-01 ENCOUNTER — Inpatient Hospital Stay: Payer: Medicaid Other

## 2020-11-01 VITALS — BP 128/80 | HR 79 | Temp 97.2°F | Resp 20 | Ht 60.0 in | Wt 195.7 lb

## 2020-11-01 DIAGNOSIS — Z79899 Other long term (current) drug therapy: Secondary | ICD-10-CM | POA: Diagnosis not present

## 2020-11-01 DIAGNOSIS — Z8042 Family history of malignant neoplasm of prostate: Secondary | ICD-10-CM | POA: Diagnosis not present

## 2020-11-01 DIAGNOSIS — D0511 Intraductal carcinoma in situ of right breast: Secondary | ICD-10-CM | POA: Insufficient documentation

## 2020-11-01 DIAGNOSIS — Z8041 Family history of malignant neoplasm of ovary: Secondary | ICD-10-CM | POA: Diagnosis not present

## 2020-11-01 DIAGNOSIS — C50412 Malignant neoplasm of upper-outer quadrant of left female breast: Secondary | ICD-10-CM | POA: Diagnosis present

## 2020-11-01 DIAGNOSIS — Z5189 Encounter for other specified aftercare: Secondary | ICD-10-CM | POA: Diagnosis not present

## 2020-11-01 DIAGNOSIS — Z170 Estrogen receptor positive status [ER+]: Secondary | ICD-10-CM | POA: Diagnosis not present

## 2020-11-01 DIAGNOSIS — R110 Nausea: Secondary | ICD-10-CM | POA: Diagnosis not present

## 2020-11-01 DIAGNOSIS — E282 Polycystic ovarian syndrome: Secondary | ICD-10-CM | POA: Insufficient documentation

## 2020-11-01 DIAGNOSIS — M2550 Pain in unspecified joint: Secondary | ICD-10-CM | POA: Diagnosis not present

## 2020-11-01 DIAGNOSIS — Z803 Family history of malignant neoplasm of breast: Secondary | ICD-10-CM | POA: Diagnosis not present

## 2020-11-01 DIAGNOSIS — Z95828 Presence of other vascular implants and grafts: Secondary | ICD-10-CM

## 2020-11-01 DIAGNOSIS — Z800 Family history of malignant neoplasm of digestive organs: Secondary | ICD-10-CM | POA: Diagnosis not present

## 2020-11-01 DIAGNOSIS — Z7189 Other specified counseling: Secondary | ICD-10-CM

## 2020-11-01 DIAGNOSIS — R0789 Other chest pain: Secondary | ICD-10-CM | POA: Diagnosis not present

## 2020-11-01 DIAGNOSIS — C773 Secondary and unspecified malignant neoplasm of axilla and upper limb lymph nodes: Secondary | ICD-10-CM | POA: Insufficient documentation

## 2020-11-01 DIAGNOSIS — Z5111 Encounter for antineoplastic chemotherapy: Secondary | ICD-10-CM | POA: Diagnosis not present

## 2020-11-01 LAB — CBC WITH DIFFERENTIAL/PLATELET
Abs Immature Granulocytes: 0.05 10*3/uL (ref 0.00–0.07)
Basophils Absolute: 0 10*3/uL (ref 0.0–0.1)
Basophils Relative: 1 %
Eosinophils Absolute: 0.1 10*3/uL (ref 0.0–0.5)
Eosinophils Relative: 4 %
HCT: 41.9 % (ref 36.0–46.0)
Hemoglobin: 14.3 g/dL (ref 12.0–15.0)
Immature Granulocytes: 2 %
Lymphocytes Relative: 48 %
Lymphs Abs: 1.3 10*3/uL (ref 0.7–4.0)
MCH: 28.4 pg (ref 26.0–34.0)
MCHC: 34.1 g/dL (ref 30.0–36.0)
MCV: 83.1 fL (ref 80.0–100.0)
Monocytes Absolute: 0.2 10*3/uL (ref 0.1–1.0)
Monocytes Relative: 8 %
Neutro Abs: 1 10*3/uL — ABNORMAL LOW (ref 1.7–7.7)
Neutrophils Relative %: 37 %
Platelets: 136 10*3/uL — ABNORMAL LOW (ref 150–400)
RBC: 5.04 MIL/uL (ref 3.87–5.11)
RDW: 12.7 % (ref 11.5–15.5)
WBC: 2.7 10*3/uL — ABNORMAL LOW (ref 4.0–10.5)
nRBC: 0 % (ref 0.0–0.2)

## 2020-11-01 LAB — COMPREHENSIVE METABOLIC PANEL
ALT: 98 U/L — ABNORMAL HIGH (ref 0–44)
AST: 17 U/L (ref 15–41)
Albumin: 3.8 g/dL (ref 3.5–5.0)
Alkaline Phosphatase: 96 U/L (ref 38–126)
Anion gap: 9 (ref 5–15)
BUN: 13 mg/dL (ref 6–20)
CO2: 28 mmol/L (ref 22–32)
Calcium: 8.8 mg/dL — ABNORMAL LOW (ref 8.9–10.3)
Chloride: 103 mmol/L (ref 98–111)
Creatinine, Ser: 0.56 mg/dL (ref 0.44–1.00)
GFR, Estimated: >60 mL/min (ref >60)
Glucose, Bld: 128 mg/dL — ABNORMAL HIGH (ref 70–99)
Potassium: 3.4 mmol/L — ABNORMAL LOW (ref 3.5–5.1)
Sodium: 140 mmol/L (ref 135–145)
Total Bilirubin: 0.9 mg/dL (ref 0.3–1.2)
Total Protein: 6.7 g/dL (ref 6.5–8.1)

## 2020-11-01 MED ORDER — SODIUM CHLORIDE 0.9% FLUSH
10.0000 mL | INTRAVENOUS | Status: DC | PRN
  Administered 2020-11-01: 10 mL via INTRAVENOUS
  Filled 2020-11-01: qty 10

## 2020-11-01 MED ORDER — ZOLEDRONIC ACID 4 MG/100ML IV SOLN
4.0000 mg | Freq: Once | INTRAVENOUS | Status: AC
  Administered 2020-11-01: 4 mg via INTRAVENOUS

## 2020-11-01 MED ORDER — ZOLEDRONIC ACID 4 MG/100ML IV SOLN
INTRAVENOUS | Status: AC
  Filled 2020-11-01: qty 100

## 2020-11-01 MED ORDER — HEPARIN SOD (PORK) LOCK FLUSH 100 UNIT/ML IV SOLN
500.0000 [IU] | Freq: Once | INTRAVENOUS | Status: AC
  Administered 2020-11-01: 500 [IU] via INTRAVENOUS
  Filled 2020-11-01: qty 5

## 2020-11-01 MED ORDER — SODIUM CHLORIDE 0.9 % IV SOLN
Freq: Once | INTRAVENOUS | Status: AC
  Filled 2020-11-01: qty 250

## 2020-11-01 NOTE — Progress Notes (Signed)
[place]  [phone] [phone]     ID: Gabriela Sullivan DOB: Nov 04, 1990  MR#: [id number]  TKP#:[id number]  Patient Care Team: Gabriela Orleans, MD as PCP - General (Family Medicine) Gabriela Germany, RN as Oncology Nurse Navigator Gabriela Kaufmann, RN as Oncology Nurse Navigator Gabriela Luna, MD as Consulting Physician (General Surgery) Gabriela Sullivan, Gabriela Dad, MD as Consulting Physician (Oncology) Gabriela Rudd, MD as Consulting Physician (Radiation Oncology) Gabriela Cruel, MD OTHER MD:  CHIEF COMPLAINT: inflammatory estrogen receptor positive breast cancer  CURRENT TREATMENT: Neoadjuvant chemotherapy; goserelin; zoledronate   INTERVAL HISTORY: Gabriela Sullivan returns today for follow up and treatment of her locally advanced breast cancer.   She began neoadjuvant chemotherapy, consisting of cyclophosphamide and doxorubicin in dose dense fashion x4, at her last visit on 10/25/2020.  Today is day 8 cycle 1.  She is scheduled to begin zolendronate today.  We discussed the possible toxicities side effects and complications of this agent including the rare cases of osteonecrosis of the jaw.  I asked her to take some extra Tums today to prevent hypocalcemia.  She had genetic testing done during clinic. The results are still pending.  Last menstrual period was January 2022.  She is having hot flashes now secondary to the Zoladex.   REVIEW OF SYSTEMS: Gabriela Sullivan did remarkably well with her first cycle of chemotherapy.  She continues to work as before.  She felt a little shaky inside she said.  She had some nausea but no vomiting.  She had no mouth sores.  Her hair has not begun to fall.  She tolerated the PEG filgrastim without any unusual pain.  She does have some fairly common back pain and some knee pain as well.  She drank a lot of water as instructed and that made her feel a little bit puffy.  She had a severe headache on day 2 likely due to the palonosetron.   Aside from these issues a detailed review of systems was stable.  Note that   COVID 19 VACCINATION STATUS: refuses vaccination   HISTORY OF CURRENT ILLNESS: From the original intake note:  Gabriela Sullivan noted a marble sized lump in her left breast in 2020.  She ignored it.  Around September 2021 this mass was "golf sized" and she brought it to her mother's attention.  She did not bring it to medical attention until several months later and she finally underwent bilateral diagnostic mammography with tomography and left breast ultrasonography at [facility] on 09/12/2020 showing: breast density category C; 5.1 cm ill-defined outer left breast mass corresponding to patient's palpable abnormality; at least 4 abnormal left axillary lymph nodes with cortical thickening; indeterminate 0.8 cm group of outer right breast calcifications; indeterminate 6 cm upper-outer left breast calcifications.  Accordingly on 09/26/2020 she proceeded to biopsy of the bilateral breast areas in question. The pathology from this procedure ([id number]) showed:  1. Left Breast, upper-outer  - invasive ductal carcinoma, grade 2.  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 Proliferation marker Ki67 at 10%. HER2 equivocal by immunohistochemistry (2+), but negative by fluorescent in situ hybridization with a signals ratio 1.61 and number per cell 2.65.  2. 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  She underwent additional biopsies on 09/26/2020. Pathology from the procedure ([id number]) showed: 1. Left Breast, 3 o'clock  - invasive ductal carcinoma, grade 2  - ductal  carcinoma in situ  - Prognostic indicators significant for: estrogen receptor, 60% positive with moderate staining intensity and progesterone receptor, 10% positive with strong staining intensity. Proliferation marker Ki67 at 15%. HER2 equivocal by immunohistochemistry (2+), but negative by fluorescent in situ hybridization with a signals ratio 1.66 and number per cell 2.65. 2. Left Axilla Lymph Node  - metastatic carcinoma  Cancer Staging Ductal carcinoma in situ (DCIS) of right breast Staging form: Breast, AJCC 8th Edition - Clinical: Stage 0 (cTis (DCIS), cN0, cM0) - Signed by Gabriela Cruel, MD on 10/04/2020  Malignant neoplasm of upper-outer quadrant of left breast in female, estrogen receptor positive ([place]) Staging form: Breast, AJCC 8th Edition - Clinical stage from 10/04/2020: Stage IIA (cT3, cN1(f), cM0, G2, ER+, PR+, HER2-) - Signed by Gabriela Cruel, MD on 10/04/2020 Stage prefix: Initial diagnosis Method of lymph node assessment: Core biopsy Histologic grading system: 3 grade system  The patient's subsequent history is as detailed below.   PAST MEDICAL HISTORY: Past Medical History:  Diagnosis Date  . Asthma    remote  . Breast cancer ([place])   . Family history of breast cancer   . Family history of ovarian cancer   . Family history of prostate cancer   . Family history of stomach cancer   . Migraines   . PCOS (polycystic ovarian syndrome)     PAST SURGICAL HISTORY: Past Surgical History:  Procedure Laterality Date  . NO PAST SURGERIES    . PORTACATH PLACEMENT Right 10/18/2020   Procedure: INSERTION PORT-A-CATH;  Surgeon: Gabriela Luna, MD;  Location: [facility];  Service: General;  Laterality: Right;    FAMILY HISTORY: Family History  Problem Relation Age of Onset  . Ovarian cancer Maternal Grandmother        unknown age of diagnosis  . Prostate cancer Maternal Grandfather        metastatic  . Breast cancer Cousin        unknown age of  diagnosis, maternal first cousin  . Stomach cancer Cousin        dx 20s/30s, maternal first cousin   She has not information on her father. Her mother is 72 as of 10/2020. Gabriela Sullivan has one brother and one sister. She reports cancer in her maternal family-- breast in a cousin, ovarian in her grandmother, prostate in her grandfather, and stomach in a cousin.   GYNECOLOGIC HISTORY:  No LMP recorded. (Menstrual status: Irregular Periods). Menarche: 30 years old [place] P 0 (s/p miscarriage in 2013) LMP 1/26 - 07/03/2020, irregular, goes 2-6 months between periods Contraceptive never used HRT n/a  Hysterectomy? no BSO? no   SOCIAL HISTORY: (updated 10/2020)  Gabriela Sullivan is currently working as a Freight forwarder at [employer] in [place]. She is single. She lives at home with her mother (who is a stay-at-home mom), their two dogs, and one cat. She is not a Designer, fashion/clothing.    ADVANCED DIRECTIVES: not in place; at the 10/04/2020 visit the patient was given the appropriate documents to complete and notarized at her discretion.   HEALTH MAINTENANCE: Social History   Tobacco Use  . Smoking status: Never Smoker  . Smokeless tobacco: Never Used  Substance Use Topics  . Alcohol use: Never  . Drug use: Never     Colonoscopy: n/a (age)  PAP:  2018  Bone density: n/a (age)   No Known Allergies  Current Outpatient Medications  Medication Sig Dispense Refill  . dexamethasone (DECADRON) 4 MG tablet Take 2 tablets (8 mg total) by mouth daily. Take daily for 3 days after chemo. Take with food. 30 tablet 1  . Goserelin Acetate (ZOLADEX [place]) Inject into the skin once a week.    Marland Kitchen ibuprofen (ADVIL) 800 MG tablet Take 1 tablet (800 mg total) by mouth every 8 (eight) hours as needed. 30 tablet 0  . lidocaine-prilocaine (EMLA) cream Apply to affected area once 30 g 3  . oxyCODONE (OXY IR/ROXICODONE) 5 MG immediate release tablet Take 1 tablet (5 mg total) by mouth every 6 (six) hours as needed for severe  pain. 15 tablet 0  . prochlorperazine (COMPAZINE) 10 MG tablet Take 1 tablet (10 mg total) by mouth every 6 (six) hours as needed (Nausea or vomiting). 30 tablet 1  . Vitamin D, Ergocalciferol, (DRISDOL) 50000 UNITS CAPS capsule Take 50,000 Units by mouth every 7 (seven) days.     No current facility-administered medications for this visit.    OBJECTIVE: White woman in no acute distress  Vitals:   11/01/20 1028  BP: 128/80  Pulse: 79  Resp: 20  Temp: (!) 97.2 F (36.2 C)  SpO2: 98%     Body mass index is 38.22 kg/m.   Wt Readings from Last 3 Encounters:  11/01/20 195 lb 11.2 oz (88.8 kg)  10/25/20 198 lb 14.4 oz (90.2 kg)  10/18/20 204 lb 5.9 oz (92.7 kg)      ECOG FS:1 - Symptomatic but completely ambulatory  Sclerae unicteric, EOMs intact Wearing a mask No cervical or supraclavicular adenopathy Lungs no rales or rhonchi Heart regular rate and rhythm Abd soft, nontender, positive bowel sounds MSK no focal spinal tenderness, no upper extremity lymphedema Neuro: nonfocal, well oriented, appropriate affect Breasts: The inflammatory component of the breast exam at today is much reduced and the palpable area is much softer.  Left breast 10/04/2020   LAB RESULTS:  CMP     Component Value Date/Time   NA 140 10/25/2020 0740   K 3.8 10/25/2020 0740   CL 107 10/25/2020 0740   CO2 24 10/25/2020 0740   GLUCOSE 131 (H) 10/25/2020 0740   BUN 18 10/25/2020 0740   CREATININE 0.58 10/25/2020 0740   CREATININE 0.64 10/04/2020 0755   CALCIUM 9.2 10/25/2020 0740   PROT 7.5 10/25/2020 0740   ALBUMIN 4.1 10/25/2020 0740   AST 50 (H) 10/25/2020 0740   AST 47 (H) 10/04/2020 0755   ALT 117 (H) 10/25/2020 0740   ALT 108 (H) 10/04/2020 0755   ALKPHOS 68 10/25/2020 0740   BILITOT 0.4 10/25/2020 0740   BILITOT 0.7 10/04/2020 0755   GFRNONAA >60 10/25/2020 0740   GFRNONAA >60 10/04/2020 0755    No results found for: TOTALPROTELP, ALBUMINELP, A1GS, A2GS, BETS, BETA2SER, GAMS,  MSPIKE, SPEI  Lab Results  Component Value Date   WBC 2.7 (L) 11/01/2020   NEUTROABS PENDING 11/01/2020   HGB 14.3 11/01/2020   HCT 41.9 11/01/2020   MCV 83.1 11/01/2020   PLT 136 (L) 11/01/2020    No results found for: LABCA2  No components found for: [id number]  No results for input(s): INR in the last 168 hours.  No results found for: LABCA2  No results found for: ZLD357  No results found for: SVX793  No results found for: JQZ009  No results found for: CA2729  No components found for:  HGQUANT  No results found for: CEA1 / No results found for: CEA1   No results found for: AFPTUMOR  No results found for: CHROMOGRNA  No results found for: KPAFRELGTCHN, LAMBDASER, KAPLAMBRATIO (kappa/lambda light chains)  No results found for: HGBA, HGBA2QUANT, HGBFQUANT, HGBSQUAN (Hemoglobinopathy evaluation)   No results found for: LDH  No results found for: IRON, TIBC, IRONPCTSAT (Iron and TIBC)  No results found for: FERRITIN  Urinalysis No results found for: COLORURINE, APPEARANCEUR, LABSPEC, PHURINE, GLUCOSEU, HGBUR, BILIRUBINUR, KETONESUR, PROTEINUR, UROBILINOGEN, NITRITE, LEUKOCYTESUR   STUDIES: CT CHEST W CONTRAST  Result Date: 10/15/2020 CLINICAL DATA:  Staging left breast cancer. EXAM: CT CHEST WITH CONTRAST TECHNIQUE: Multidetector CT imaging of the chest was performed during intravenous contrast administration. CONTRAST:  32m OMNIPAQUE IOHEXOL 300 MG/ML  SOLN COMPARISON:  None FINDINGS: Cardiovascular: The heart size appears within normal limits. No pericardial effusion. Mediastinum/Nodes: Normal appearance of the thyroid gland. The trachea appears patent and is midline. Normal appearance of the esophagus. No enlarged supraclavicular, right axillary, mediastinal, or hilar lymph nodes. Multiple enlarged left axillary lymph nodes are identified. The largest measures 2.2 cm short axis, image 43/2. Lungs/Pleura: Lungs are clear. No pleural effusion or pneumothorax. No  suspicious lung nodules. Upper Abdomen: No acute abnormality. Musculoskeletal: Within the T11 vertebral body there are 2 lucent bone lesions which are nonspecific measuring up to 7 mm. Within the distal body of sternum there is a small lucent bone lesion which measures 6 mm, image 114/7. On the MRI images obtained 10/06/2020 there may be a small area of corresponding enhancement noted. A second area within the proximal third of the body of sternum is more subtle in appearance, image 118/7. There is also a focal area of enhancement on the MR images. IMPRESSION: 1. Enlarged left axillary lymph nodes compatible with metastatic adenopathy. 2. Several lucent lesions are identified within the lower thoracic spine and sternum. In retrospect, on the MRI of the breast from 10/07/2014 there are 2 areas of enhancement within the sternum (best seen on the postcontrast subtraction images) which correspond to the lucent lesions on today's study and are suspicious for bone metastasis. Thoracic spine MRI may be helpful to confirm metastatic disease within the T11 lucent lesion. Electronically Signed   By: TKerby MoorsM.D.   On: 10/15/2020 06:29   NM Bone Scan Whole Body  Result Date: 10/15/2020 CLINICAL DATA:  Breast cancer staging. Right-sided low back pain for several years. EXAM: NUCLEAR MEDICINE WHOLE BODY BONE SCAN TECHNIQUE: Whole body anterior and posterior images were obtained approximately 3 hours after intravenous injection of radiopharmaceutical. RADIOPHARMACEUTICALS:  20.0 mCi Technetium-928mDP IV COMPARISON:  CT of the chest Oct 13, 2020 FINDINGS: There is a focus of uptake posteriorly either in the lower thoracic spine or the upper lumbar spine. Lucent lesions are seen in T11 on the previous CT scan. The uptake on today's study appears to be a lower than the level of these lucent lesions. Lucent lesions were seen in the sternum as well with no correlate on today's study. No other discrete bony lesions are  identified. Increased uptake in the left breast consistent with the patient's known malignancy. IMPRESSION: 1. Lucent lesions were seen in the sternum and T11 on the CT scan from today. Purely lucent metastases are often not well visualized on bone scans. An MRI was recommended and would be much more sensitive. 2. Uptake near midline of the lower thoracic or upper lumbar vertebral body. The finding is nonspecific but at least somewhat  concerning for a metastasis given history and CT of the chest findings. Extending the recommended MRI of the thoracic spine through L1 would be helpful to evaluate this region. 3. No other scintigraphic evidence of bony metastatic disease. 4. Increased uptake in the left breast consistent with known malignancy. 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 LABS:  No labs drawn at time of imaging. EXAM: BILATERAL BREAST MRI WITH AND WITHOUT CONTRAST TECHNIQUE: Multiplanar, multisequence MR images of both breasts were obtained prior to and following the intravenous administration of 10 ml of Gadavist Three-dimensional MR images were rendered by post-processing of the original MR data on an independent workstation. The three-dimensional MR images were interpreted, and findings are reported in the following complete MRI report for this study. Three dimensional images were evaluated at the independent interpreting workstation using the DynaCAD thin client. COMPARISON:  Prior exams. FINDINGS: Breast composition: c. Heterogeneous fibroglandular tissue. Background parenchymal enhancement: Mild Right breast: There is enhancement extending obliquely from superior to inferior consistent with the  previous stereotactic biopsy tract. Subtle susceptibility artifact lies along the inferior aspect of this tract consistent with the post biopsy X shaped biopsy clip. At the level of the clip, surrounding enhancement measures 8 mm in greatest dimension. This reflects the low-grade DCIS found on biopsy. There are no other areas of abnormal right breast enhancement. No defined masses. Left breast: Large heterogeneous area predominantly non mass enhancement extends from the anterior to the posterior breast, throughout the upper outer and lower outer quadrants, largest discrete area in the lower outer quadrant, where there is a small focus of susceptibility artifact consistent with the heart shaped biopsy clip. This portion of the abnormal enhancement, in the lower outer quadrant, spans approximately 5.8 x 3.8 cm transversely. The overall span of the abnormal enhancement measures 11 cm anterior-posterior by 9 cm, superior inferior, by 7 cm right to left. There is separate mass-like area of abnormal enhancement along the inferior, anterior breast, projecting near 6 o'clock, measuring 1.9 x 0.9 x 1.2 cm. This area demonstrates washout kinetics. There is skin thickening with hazy increased skin enhancement throughout the left breast most prominent laterally. Lymph nodes: Multiple enlarged/abnormal left axillary lymph nodes. Artifact from the tribell biopsy clip lies within one of the more lateral lymph nodes. Largest node measures 2.4 cm in short axis. Six lymph nodes are abnormal by size and/or cortical thickness. There are additional prominent subcentimeter lymph nodes. No enlarged or abnormal right axillary lymph nodes. Ancillary findings:  None. IMPRESSION: 1. Extensive abnormal, predominantly non mass enhancement throughout the lateral left breast involving the lower outer and upper outer quadrants, consistent with a more extensive expanse of malignancy than evident on mammography or ultrasound. This spans 11 x 7 x 9  cm. 2. Additional mass-like abnormal enhancement in the anterior, inferior left breast suspicious for an additional focus of carcinoma. This spans 1.9 x 0.9 x 1.2 cm. 3. Skin thickening and enhancement of the left breast, which predominates laterally. Findings concerning for inflammatory breast carcinoma. 4. 8 mm area of enhancement surrounds the X shaped biopsy clip in right breast, reflecting the biopsy-proven DCIS. Enhancement is also noted along the biopsy tract. There are  no findings to suggest additional right breast malignancy. 5. Multiple abnormal left axillary lymph nodes, 6 of which are enlarged/abnormal by size/cortical thickness, 1 of these being the biopsy-proven metastatic lymph node. RECOMMENDATION: 1. MRI guided biopsy the 1.9 cm mass-like area of abnormal enhancement along the anterior, inferior left breast, if this would alter treatment. 2. Otherwise treatment as planned for the known multifocal left breast carcinoma and metastatic lymphadenopathy and small focus of right breast DCIS. BI-RADS CATEGORY  4: Suspicious. Electronically Signed   By: Lajean Manes M.D.   On: 10/06/2020 10:58   DG Chest Port 1 View  Result Date: 10/18/2020 CLINICAL DATA:  30 year old female Port-A-Cath placement. Left breast cancer. EXAM: PORTABLE CHEST 1 VIEW COMPARISON:  Chest CT 10/13/2020. FINDINGS: Portable AP upright view at 0950 hours. Right chest IJ approach Port-A-Cath in place. Catheter tip projects at the lower SVC level just below the carina. No pneumothorax. Lung volumes and mediastinal contours are normal. Allowing for portable technique the lungs are clear. Stable visualized osseous structures. IMPRESSION: Right chest Port-A-Cath placed with no adverse features. Electronically Signed   By: Genevie Ann M.D.   On: 10/18/2020 10:26   DG Fluoro Guide CV Line-No Report  Result Date: 10/18/2020 Fluoroscopy was utilized by the requesting physician.  No radiographic interpretation.   ECHOCARDIOGRAM  COMPLETE  Result Date: 10/13/2020    ECHOCARDIOGRAM REPORT   Patient Name:   Gabriela Sullivan Date of Exam: 10/13/2020 Medical Rec #:  [id number]  Height:       60.0 in Accession #:    [id number] Weight:       200.0 lb Date of Birth:  Sep 13, 1990   BSA:          1.867 m Patient Age:    29 years   BP:           145/63 mmHg Patient Gender: F          HR:           69 bpm. Exam Location:  Outpatient Procedure: 2D Echo, 3D Echo, Cardiac Doppler, Color Doppler and Strain Analysis                                 MODIFIED REPORT:     This report was modified by Lyman Bishop MD on 10/13/2020 due to Updated                               diastolic function.  Indications:     Z51.11 Encounter for antineoplastic chemotheraphy  History:         Patient has no prior history of Echocardiogram examinations.                  Breast Cancer.  Sonographer:     Jonelle Sidle Dance Referring Phys:  [place] Diagnosing Phys: Lyman Bishop MD IMPRESSIONS  1. Left ventricular ejection fraction, by estimation, is 60 to 65%. The left ventricle has normal function. The left ventricle has no regional wall motion abnormalities. Left ventricular diastolic parameters are normal. The average left ventricular global longitudinal strain is -18.6 %. The global longitudinal strain is low normal.  2. Right ventricular systolic function is normal. The right ventricular size is normal.  3. The mitral valve is abnormal. Trivial mitral valve regurgitation.  4. The aortic valve is tricuspid. Aortic valve regurgitation is not visualized.  5. The inferior vena cava is normal in size with greater than 50% respiratory variability, suggesting right atrial pressure of 3 mmHg. Comparison(s): No prior Echocardiogram. FINDINGS  Left Ventricle: Left ventricular ejection fraction, by estimation, is 60 to 65%. The left ventricle has normal function. The left ventricle has no regional wall motion abnormalities. The average left ventricular global longitudinal strain is  -18.6 %. The global longitudinal strain is normal. 3D left ventricular ejection fraction analysis performed but not reported based on interpreter judgement due to suboptimal quality. The left ventricular internal cavity size was normal in size. There is no left ventricular hypertrophy. Left ventricular diastolic parameters were normal. Indeterminate filling pressures. Right Ventricle: The right ventricular size is normal. No increase in right ventricular wall thickness. Right ventricular systolic function is normal. Left Atrium: Left atrial size was normal in size. Right Atrium: Right atrial size was normal in size. Pericardium: There is no evidence of pericardial effusion. Mitral Valve: The mitral valve is abnormal. There is mild calcification of the mitral valve leaflet(s). Trivial mitral valve regurgitation. Tricuspid Valve: The tricuspid valve is grossly normal. Tricuspid valve regurgitation is trivial. Aortic Valve: The aortic valve is tricuspid. Aortic valve regurgitation is not visualized. Pulmonic Valve: The pulmonic valve was normal in structure. Pulmonic valve regurgitation is not visualized. Aorta: The aortic root and ascending aorta are structurally normal, with no evidence of dilitation. Venous: The inferior vena cava is normal in size with greater than 50% respiratory variability, suggesting right atrial pressure of 3 mmHg. IAS/Shunts: No atrial level shunt detected by color flow Doppler.  LEFT VENTRICLE PLAX 2D LVIDd:         3.80 cm  Diastology LVIDs:         2.50 cm  LV e' medial:    6.64 cm/s LV PW:         1.10 cm  LV E/e' medial:  14.6 LV IVS:        1.00 cm  LV e' lateral:   9.68 cm/s LVOT diam:     1.80 cm  LV E/e' lateral: 10.0 LV SV:         56 LV SV Index:   30       2D Longitudinal Strain LVOT Area:     2.54 cm 2D Strain GLS (A2C):   -21.1 %                         2D Strain GLS (A3C):   -18.1 %                         2D Strain GLS (A4C):   -16.6 %                         2D Strain GLS  Avg:     -18.6 %                          3D Volume EF:                         3D EF:        70 %                         LV EDV:       113 ml  LV ESV:       34 ml                         LV SV:        79 ml RIGHT VENTRICLE             IVC RV Basal diam:  2.50 cm     IVC diam: 1.00 cm RV S prime:     11.10 cm/s TAPSE (M-mode): 1.8 cm LEFT ATRIUM             Index       RIGHT ATRIUM           Index LA diam:        3.90 cm 2.09 cm/m  RA Area:     11.50 cm LA Vol (A2C):   36.3 ml 19.45 ml/m RA Volume:   23.90 ml  12.80 ml/m LA Vol (A4C):   27.4 ml 14.68 ml/m LA Biplane Vol: 31.9 ml 17.09 ml/m  AORTIC VALVE LVOT Vmax:   80.20 cm/s LVOT Vmean:  60.200 cm/s LVOT VTI:    0.220 m  AORTA Ao Root diam: 2.50 cm Ao Asc diam:  2.80 cm MITRAL VALVE MV Area (PHT): 3.36 cm    SHUNTS MV Decel Time: 226 msec    Systemic VTI:  0.22 m MV E velocity: 96.70 cm/s  Systemic Diam: 1.80 cm MV A velocity: 77.10 cm/s MV E/A ratio:  1.25 Lyman Bishop MD Electronically signed by Lyman Bishop MD Signature Date/Time: 10/13/2020/9:31:45 AM    Final (Updated)    MR TOTAL SPINE METS SCREENING  Result Date: 10/21/2020 CLINICAL DATA:  30 year old female with breast cancer. CT evidence of osseous metastatic disease. EXAM: MRI TOTAL SPINE WITHOUT AND WITH CONTRAST TECHNIQUE: Multisequence MR imaging of the spine from the cervical spine to the sacrum was performed prior to and following IV contrast administration for evaluation of spinal metastatic disease. CONTRAST:  24m GADAVIST GADOBUTROL 1 MMOL/ML IV SOLN COMPARISON:  Whole-body bone scan 10/13/2020.  CT Chest 10/13/2020. FINDINGS: MRI CERVICAL SPINE FINDINGS Alignment: Mild straightening of lordosis.  No spondylolisthesis. Vertebrae: Mildly heterogeneous marrow signal, but no marrow edema or suspicious vertebral enhancement. Cord: Appears normal. No dural thickening or abnormal intradural enhancement identified. Posterior Fossa, vertebral arteries, paraspinal  tissues: Cervicomedullary junction and visible brain parenchyma appear grossly negative. Negative visible paraspinal soft tissues. Disc levels: No significant degenerative changes.  No cervical spinal stenosis. MRI THORACIC SPINE FINDINGS Segmentation:  Normal. Alignment:  Relatively normal cervical lordosis. Vertebrae: Abnormal enhancement and marrow edema within the left T2 pars and spinous process (series 21 images 10 through 13 and series 25, images 10 and 12) corresponds to a lucent osseous lesion on the recent chest CT. Subtle small round enhancing T4 vertebral body metastasis also suspected on series 25, image 8. Subtle right T6 transverse process metastasis also suspected on series 25, image 3. Heterogeneously enhancing 10 mm metastasis in the anterior lower T10 body was heterogeneous on the recent CT. 11 mm right side T11 and subtle 6 mm left side T11 enhancing metastases were lytic on the comparison CT. And there is heterogeneous metastatic involvement of the T12 vertebra especially the spinous process (series 26, image 10). No extraosseous extension of tumor. Cord: No thoracic cord signal abnormality. No abnormal intradural enhancement or dural thickening. Conus medullaris appears normal at T12-L1. Paraspinal and other soft tissues: Negative. Disc levels: Multiple degenerative disc protrusions in the thoracic spine at T6-T7 (  left paracentral series 33, image 6), T7-T8 (left paracentral image 10), and T9-T10 (moderate right paracentral series 30, image 11 and series 33, image 23) result in mild degenerative spinal stenosis with up to mild cord mass effect at the latter. MRI LUMBAR SPINE FINDINGS Segmentation:  Normal. Alignment:  Preserved lumbar lordosis. Vertebrae: Subtle anterior L2 vertebral body enhancing metastasis suspected on series 26, image 10, 7 mm. Small L4 posteroinferior 9 mm enhancing metastasis. Subtle L5 6-7 mm mildly enhancing metastasis (series 26, image 8 and series 22, image 8). No  extraosseous extension of tumor. Conus medullaris: Extends to the T12-L1 level and appears normal. No abnormal intradural enhancement. No dural thickening. Paraspinal and other soft tissues: Negative. Disc levels: No significant degenerative changes. Other findings: Confluent sacral metastatic disease at the central S2 body and extending into the right sacral ala toward the right SI joint. See series 26, image 9 and series 22 images 3 through 9. IMPRESSION: 1. No cervical vertebral metastasis. Widely scattered thoracic and lumbar vertebral body and posterior element metastases. These are generally small, with no extraosseous or epidural space extension. Confluent sacral metastasis extends from the S2 body into the right sacral ala. 2. No spinal cord or cauda equina metastasis. No malignant spinal stenosis. 3. Multiple degenerative thoracic disc herniations, with mild degenerative spinal stenosis and mild spinal cord mass effect at the T9-T10 level, but no cord signal abnormality. Electronically Signed   By: Genevie Ann M.D.   On: 10/21/2020 10:28     ELIGIBLE FOR AVAILABLE RESEARCH PROTOCOL:   ASSESSMENT: 30 y.o. 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, grade 1, estrogen and progesterone receptor positive  (b) breast MRI 10/06/2020 shows a 11 cm area of malignancy, with a second 1.9 cm mass, and skin thickening concerning for inflammatory breast carcinoma; at least 6 abnormal lymph nodes noted  (c) chest CT scan 10/13/2020 shows clear lungs and no obvious liver involvement but multiple lytic bone lesions in addition to the findings and MRI  above  (d) bone scan 10/13/2020 shows nonspecific findings (lytic lesions not expected to be "hot")  (e) total spinal MRI 10/21/2020 shows 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  No cervical vertebral metastases noted.  There is also degenerative disc disease.  (1) genetics testing  (2) neoadjuvant chemotherapy consisting of cyclophosphamide and doxorubicin in dose dense fashion x4 starting 10/24/2020, possibly followed by paclitaxel weekly x12  (a) echo 10/13/2020 showed an ejection fraction in the 60-65% range  (3) definitive surgery for optimal local control pending  (4) adjuvant radiation to the left side to follow  (5) antiestrogens to follow at the completion of local treatment  (a) goserelin started 10/11/2020  (6) zolendronate to start 11/02/2018   PLAN: Gabriela Sullivan tolerated the first cycle of chemotherapy generally well.  I am going to discontinue the palonosetron which caused her severe headache.  I have added some Compazine to the other antinausea medicines and I expect she will do better with regards to that.  She will  receive her first zoledronate today and she has a good understanding of the possible toxicities side effects and complications of this agent.  She will let me know if she has any side effects from it  Roland today is 1.0 so we are not proceeding to prophylactic antibiotics.  She will return next week for cycle 2 of doxorubicin and cyclophosphamide.  She will also receive goserelin next week.  We are still waiting on the genetics test to finalize surgical plans.  Total encounter time 25 minutes.Sarajane Jews C. Quintella Mura, MD 11/01/2020 10:40 AM Medical Oncology and Hematology [facility] [place], [place] [address] Tel. [phone]    Fax.  [phone]   This document serves as a record of services personally performed by Lurline Del, MD. It was created on his behalf by Wilburn Mylar, a trained medical scribe. The creation of this record is based on the scribe's personal observations and the provider's statements to them.   I, Lurline Del MD, have reviewed the above documentation for accuracy and completeness, and I agree with the above.   *Total Encounter Time as defined by the Centers for Medicare and Medicaid Services includes, in addition to the face-to-face time of a patient visit (documented in the note above) non-face-to-face time: obtaining and reviewing outside history, ordering and reviewing medications, tests or procedures, care coordination (communications with other health care professionals or caregivers) and documentation in the medical record.

## 2020-11-01 NOTE — Patient Instructions (Signed)
Zoledronic Acid Injection (Hypercalcemia, Oncology) What is this medicine? ZOLEDRONIC ACID (ZOE le dron ik AS id) slows calcium loss from bones. It high calcium levels in the blood from some kinds of cancer. It may be used in other people at risk for bone loss. This medicine may be used for other purposes; ask your health care provider or pharmacist if you have questions. COMMON BRAND NAME(S): Zometa What should I tell my health care provider before I take this medicine? They need to know if you have any of these conditions:  cancer  dehydration  dental disease  kidney disease  liver disease  low levels of calcium in the blood  lung or breathing disease (asthma)  receiving steroids like dexamethasone or prednisone  an unusual or allergic reaction to zoledronic acid, other medicines, foods, dyes, or preservatives  pregnant or trying to get pregnant  breast-feeding How should I use this medicine? This drug is injected into a vein. It is given by a health care provider in a hospital or clinic setting. Talk to your health care provider about the use of this drug in children. Special care may be needed. Overdosage: If you think you have taken too much of this medicine contact a poison control center or emergency room at once. NOTE: This medicine is only for you. Do not share this medicine with others. What if I miss a dose? Keep appointments for follow-up doses. It is important not to miss your dose. Call your health care provider if you are unable to keep an appointment. What may interact with this medicine?  certain antibiotics given by injection  NSAIDs, medicines for pain and inflammation, like ibuprofen or naproxen  some diuretics like bumetanide, furosemide  teriparatide  thalidomide This list may not describe all possible interactions. Give your health care provider a list of all the medicines, herbs, non-prescription drugs, or dietary supplements you use. Also tell  them if you smoke, drink alcohol, or use illegal drugs. Some items may interact with your medicine. What should I watch for while using this medicine? Visit your health care provider for regular checks on your progress. It may be some time before you see the benefit from this drug. Some people who take this drug have severe bone, joint, or muscle pain. This drug may also increase your risk for jaw problems or a broken thigh bone. Tell your health care provider right away if you have severe pain in your jaw, bones, joints, or muscles. Tell you health care provider if you have any pain that does not go away or that gets worse. Tell your dentist and dental surgeon that you are taking this drug. You should not have major dental surgery while on this drug. See your dentist to have a dental exam and fix any dental problems before starting this drug. Take good care of your teeth while on this drug. Make sure you see your dentist for regular follow-up appointments. You should make sure you get enough calcium and vitamin D while you are taking this drug. Discuss the foods you eat and the vitamins you take with your health care provider. Check with your health care provider if you have severe diarrhea, nausea, and vomiting, or if you sweat a lot. The loss of too much body fluid may make it dangerous for you to take this drug. You may need blood work done while you are taking this drug. Do not become pregnant while taking this drug. Women should inform their health care provider   if they wish to become pregnant or think they might be pregnant. There is potential for serious harm to an unborn child. Talk to your health care provider for more information. What side effects may I notice from receiving this medicine? Side effects that you should report to your doctor or health care provider as soon as possible:  allergic reactions (skin rash, itching or hives; swelling of the face, lips, or tongue)  bone  pain  infection (fever, chills, cough, sore throat, pain or trouble passing urine)  jaw pain, especially after dental work  joint pain  kidney injury (trouble passing urine or change in the amount of urine)  low blood pressure (dizziness; feeling faint or lightheaded, falls; unusually weak or tired)  low calcium levels (fast heartbeat; muscle cramps or pain; pain, tingling, or numbness in the hands or feet; seizures)  low magnesium levels (fast, irregular heartbeat; muscle cramp or pain; muscle weakness; tremors; seizures)  low red blood cell counts (trouble breathing; feeling faint; lightheaded, falls; unusually weak or tired)  muscle pain  redness, blistering, peeling, or loosening of the skin, including inside the mouth  severe diarrhea  swelling of the ankles, feet, hands  trouble breathing Side effects that usually do not require medical attention (report to your doctor or health care provider if they continue or are bothersome):  anxious  constipation  coughing  depressed mood  eye irritation, itching, or pain  fever  general ill feeling or flu-like symptoms  nausea  pain, redness, or irritation at site where injected  trouble sleeping This list may not describe all possible side effects. Call your doctor for medical advice about side effects. You may report side effects to FDA at 1-800-FDA-1088. Where should I keep my medicine? This drug is given in a hospital or clinic. It will not be stored at home. NOTE: This sheet is a summary. It may not cover all possible information. If you have questions about this medicine, talk to your doctor, pharmacist, or health care provider.  2021 [employer]/Gold Standard (2019-03-04 09:13:00)  

## 2020-11-03 NOTE — Progress Notes (Signed)
[facility] OFFICE PROGRESS NOTE  [name], MD [place] [address]  DIAGNOSIS: Inflammatory estrogen receptor positive breast cancer  CURRENT THERAPY: Neoadjuvant chemotherapy; goserelin; zoledronate. Today is day 1 cycle #2.   INTERVAL HISTORY: Quyen Cutsforth 30 y.o. female returns to the clinic today for a follow-up visit.  The patient is status post her first cycle of chemotherapy and she tolerated it fairly well.  She states she is feeling "pretty good". She had a headache which Dr. Jana Hakim attributed to her palonsetron which he has discontinued. She does not have any concerning complaints today. She had some arthralgias from the neulasta injection but was able to tolerate it well without claritin or tylenol. She states she does not like taking medications. She also had a mouth sore which has resolved with salt water rinses. She denies any fever, chills, night sweats, or weight loss.  She denies any chest pain, cough, or shortness of breath.  She denies any peripheral neuropathy.  She denies any vomiting, diarrhea, or constipation but has some mild nausea.  She denies any signs or symptoms of infection including dysuria, skin infections, or sore throat.  She is here today for evaluation before starting cycle #2.  COVID 19 VACCINATION STATUS: refuses vaccination   HISTORY OF CURRENT ILLNESS: From the original intake note:  Tanekia Ryans noted a marble sized lump in her left breast in 2020.  She ignored it.  Around September 2021 this mass was "golf sized" and she brought it to her mother's attention.  She did not bring it to medical attention until several months later and she finally underwent bilateral diagnostic mammography with tomography and left breast ultrasonography at [facility] on 09/12/2020 showing: breast density category C; 5.1 cm ill-defined outer left breast mass corresponding to patient's palpable abnormality; at least 4 abnormal left axillary  lymph nodes with cortical thickening; indeterminate 0.8 cm group of outer right breast calcifications; indeterminate 6 cm upper-outer left breast calcifications.  Accordingly on 09/26/2020 she proceeded to biopsy of the bilateral breast areas in question. The pathology from this procedure ([id number]) showed:  1. Left Breast, upper-outer             - invasive ductal carcinoma, grade 2.             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 Proliferation marker Ki67 at 10%. HER2 equivocal by immunohistochemistry (2+), but negative by fluorescent in situ hybridization with a signals ratio 1.61 and number per cell 2.65.  2. Right Breast, upper-outer             - low grade ductal carcinoma in situ             - Prognostic indicators significant for: estrogen receptor, >95% positive and progesterone receptor, >95% positive, both with strong staining intensity.  She underwent additional biopsies on 09/26/2020. Pathology from the procedure ([id number]) showed: 1. Left Breast, 3 o'clock             - invasive ductal carcinoma, grade 2             - ductal carcinoma in situ             - Prognostic indicators significant for: estrogen receptor, 60% positive  with moderate staining intensity and progesterone receptor, 10% positive with strong staining intensity. Proliferation marker Ki67 at 15%. HER2 equivocal by immunohistochemistry (2+), but negative by fluorescent in situ hybridization with a signals ratio 1.66 and number per cell 2.65. 2. Left Axilla Lymph Node             - metastatic carcinoma  Cancer Staging Ductal carcinoma in situ (DCIS) of right breast Staging form: Breast, AJCC 8th Edition - Clinical: Stage 0 (cTis (DCIS), cN0, cM0) - Signed by Chauncey Cruel, MD on  10/04/2020  Malignant neoplasm of upper-outer quadrant of left breast in female, estrogen receptor positive ([place]) Staging form: Breast, AJCC 8th Edition - Clinical stage from 10/04/2020: Stage IIA (cT3, cN1(f), cM0, G2, ER+, PR+, HER2-) - Signed by Chauncey Cruel, MD on 10/04/2020 Stage prefix: Initial diagnosis Method of lymph node assessment: Core biopsy Histologic grading system: 3 grade system  The patient's subsequent history is as detailed below.   MEDICAL HISTORY: Past Medical History:  Diagnosis Date  . Asthma    remote  . Breast cancer ([place])   . Family history of breast cancer   . Family history of ovarian cancer   . Family history of prostate cancer   . Family history of stomach cancer   . Migraines   . PCOS (polycystic ovarian syndrome)     ALLERGIES:  has No Known Allergies.  MEDICATIONS:  Current Outpatient Medications  Medication Sig Dispense Refill  . amitriptyline (ELAVIL) 50 MG tablet Take 50 mg by mouth at bedtime.    Marland Kitchen dexamethasone (DECADRON) 4 MG tablet Take 2 tablets (8 mg total) by mouth daily. Take daily for 3 days after chemo. Take with food. 30 tablet 1  . Goserelin Acetate (ZOLADEX [place]) Inject into the skin once a week.    Marland Kitchen ibuprofen (ADVIL) 800 MG tablet Take 1 tablet (800 mg total) by mouth every 8 (eight) hours as needed. 30 tablet 0  . lidocaine-prilocaine (EMLA) cream Apply to affected area once 30 g 3  . magic mouthwash SOLN Take 5 mLs by mouth 4 (four) times daily as needed for mouth pain. 240 mL 0  . oxyCODONE (OXY IR/ROXICODONE) 5 MG immediate release tablet Take 1 tablet (5 mg total) by mouth every 6 (six) hours as needed for severe pain. 15 tablet 0  . prochlorperazine (COMPAZINE) 10 MG tablet Take 1 tablet (10 mg total) by mouth every 6 (six) hours as needed (Nausea or vomiting). 30 tablet 1  . UDENYCA 6 MG/0.6ML injection Inject into the skin.    . Vitamin D, Ergocalciferol, (DRISDOL) 50000 UNITS CAPS capsule Take 50,000 Units by mouth  every 7 (seven) days.     No current facility-administered medications for this visit.    SURGICAL HISTORY:  Past Surgical History:  Procedure Laterality Date  . NO PAST SURGERIES    . PORTACATH PLACEMENT Right 10/18/2020   Procedure: INSERTION PORT-A-CATH;  Surgeon: Erroll Luna, MD;  Location: [facility];  Service: General;  Laterality: Right;    REVIEW OF SYSTEMS:   Review of Systems  Constitutional: Negative for appetite change, chills, fatigue, fever and unexpected weight change.  HENT: Negative for mouth sores, nosebleeds, sore throat and trouble swallowing.   Eyes: Negative for eye problems and icterus.  Respiratory: Negative for cough, hemoptysis, shortness of breath and wheezing.   Cardiovascular: Negative for chest pain and leg swelling.  Gastrointestinal: Negative for abdominal pain, constipation, diarrhea, nausea and vomiting.  Genitourinary: Negative for bladder  incontinence, difficulty urinating, dysuria, frequency and hematuria.   Musculoskeletal: Negative for back pain, gait problem, neck pain and neck stiffness.  Skin: Negative for itching and rash.  Neurological: Negative for dizziness, extremity weakness, gait problem, headaches, light-headedness and seizures.  Hematological: Negative for adenopathy. Does not bruise/bleed easily.  Psychiatric/Behavioral: Negative for confusion, depression and sleep disturbance. The patient is not nervous/anxious.    GYNECOLOGIC HISTORY:  No LMP recorded. (Menstrual status: Irregular Periods). Menarche: 30 years old [place] P 0 (s/p miscarriage in 2013) LMP 1/26 - 07/03/2020, irregular, goes 2-6 months between periods Contraceptive never used HRT n/a Hysterectomy? no BSO? 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 She is single. She lives at home with her mother (who is a stay-at-home mom), their two dogs, and one cat. She is not a  Designer, fashion/clothing.                          ADVANCED DIRECTIVES: not in place; at the 10/04/2020 visit the patient was given the appropriate documents to complete and notarized at her discretion.  PHYSICAL EXAMINATION:  Blood pressure (!) 139/93, pulse 76, temperature 97.9 F (36.6 C), temperature source Tympanic, resp. rate 17, height 5' (1.524 m), weight 196 lb 11.2 oz (89.2 kg), SpO2 99 %.  ECOG PERFORMANCE STATUS: 1 - Symptomatic but completely ambulatory  Physical Exam  Constitutional: Oriented to person, place, and time and well-developed, well-nourished, and in no distress.  HENT:  Head: Normocephalic and atraumatic.  Mouth/Throat: Oropharynx is clear and moist. No oropharyngeal exudate.  Eyes: Conjunctivae are normal. Right eye exhibits no discharge. Left eye exhibits no discharge. No scleral icterus.  Neck: Normal range of motion. Neck supple.  Cardiovascular: Normal rate, regular rhythm, normal heart sounds and intact distal pulses.   Pulmonary/Chest: Effort normal and breath sounds normal. No respiratory distress. No wheezes. No rales.  Abdominal: Soft. Bowel sounds are normal. Exhibits no distension and no mass. There is no tenderness.  Musculoskeletal: Normal range of motion. Exhibits no edema.  Lymphadenopathy:    No cervical adenopathy.  Neurological: Alert and oriented to person, place, and time. Exhibits normal muscle tone. Gait normal. Coordination normal.  Skin: Skin is warm and dry. No rash noted. Not diaphoretic. No erythema. No pallor.  Psychiatric: Mood, memory and judgment normal.  Breast: Palpable mass in left breast.  Vitals reviewed.  LABORATORY DATA: Lab Results  Component Value Date   WBC 9.5 11/08/2020   HGB 13.6 11/08/2020   HCT 39.8 11/08/2020   MCV 83.1 11/08/2020   PLT 193 11/08/2020      Chemistry      Component Value Date/Time   NA 142 11/08/2020 0923   K 3.8 11/08/2020 0923   CL 106 11/08/2020 0923   CO2 25 11/08/2020 0923   BUN 13  11/08/2020 0923   CREATININE 0.60 11/08/2020 0923   CREATININE 0.64 10/04/2020 0755      Component Value Date/Time   CALCIUM 9.5 11/08/2020 0923   ALKPHOS 107 11/08/2020 0923   AST 50 (H) 11/08/2020 0923   AST 47 (H) 10/04/2020 0755   ALT 106 (H) 11/08/2020 0923   ALT 108 (H) 10/04/2020 0755   BILITOT 0.4 11/08/2020 0923   BILITOT 0.7 10/04/2020 0755       RADIOGRAPHIC STUDIES:  CT CHEST W CONTRAST  Result Date: 10/15/2020 CLINICAL DATA:  Staging left breast cancer. EXAM: CT CHEST  WITH CONTRAST TECHNIQUE: Multidetector CT imaging of the chest was performed during intravenous contrast administration. CONTRAST:  71mL OMNIPAQUE IOHEXOL 300 MG/ML  SOLN COMPARISON:  None FINDINGS: Cardiovascular: The heart size appears within normal limits. No pericardial effusion. Mediastinum/Nodes: Normal appearance of the thyroid gland. The trachea appears patent and is midline. Normal appearance of the esophagus. No enlarged supraclavicular, right axillary, mediastinal, or hilar lymph nodes. Multiple enlarged left axillary lymph nodes are identified. The largest measures 2.2 cm short axis, image 43/2. Lungs/Pleura: Lungs are clear. No pleural effusion or pneumothorax. No suspicious lung nodules. Upper Abdomen: No acute abnormality. Musculoskeletal: Within the T11 vertebral body there are 2 lucent bone lesions which are nonspecific measuring up to 7 mm. Within the distal body of sternum there is a small lucent bone lesion which measures 6 mm, image 114/7. On the MRI images obtained 10/06/2020 there may be a small area of corresponding enhancement noted. A second area within the proximal third of the body of sternum is more subtle in appearance, image 118/7. There is also a focal area of enhancement on the MR images. IMPRESSION: 1. Enlarged left axillary lymph nodes compatible with metastatic adenopathy. 2. Several lucent lesions are identified within the lower thoracic spine and sternum. In retrospect, on the MRI  of the breast from 10/07/2014 there are 2 areas of enhancement within the sternum (best seen on the postcontrast subtraction images) which correspond to the lucent lesions on today's study and are suspicious for bone metastasis. Thoracic spine MRI may be helpful to confirm metastatic disease within the T11 lucent lesion. Electronically Signed   By: Kerby Moors M.D.   On: 10/15/2020 06:29   NM Bone Scan Whole Body  Result Date: 10/15/2020 CLINICAL DATA:  Breast cancer staging. Right-sided low back pain for several years. EXAM: NUCLEAR MEDICINE WHOLE BODY BONE SCAN TECHNIQUE: Whole body anterior and posterior images were obtained approximately 3 hours after intravenous injection of radiopharmaceutical. RADIOPHARMACEUTICALS:  20.0 mCi Technetium-46m MDP IV COMPARISON:  CT of the chest Oct 13, 2020 FINDINGS: There is a focus of uptake posteriorly either in the lower thoracic spine or the upper lumbar spine. Lucent lesions are seen in T11 on the previous CT scan. The uptake on today's study appears to be a lower than the level of these lucent lesions. Lucent lesions were seen in the sternum as well with no correlate on today's study. No other discrete bony lesions are identified. Increased uptake in the left breast consistent with the patient's known malignancy. IMPRESSION: 1. Lucent lesions were seen in the sternum and T11 on the CT scan from today. Purely lucent metastases are often not well visualized on bone scans. An MRI was recommended and would be much more sensitive. 2. Uptake near midline of the lower thoracic or upper lumbar vertebral body. The finding is nonspecific but at least somewhat concerning for a metastasis given history and CT of the chest findings. Extending the recommended MRI of the thoracic spine through L1 would be helpful to evaluate this region. 3. No other scintigraphic evidence of bony metastatic disease. 4. Increased uptake in the left breast consistent with known malignancy.  Electronically Signed   By: Dorise Bullion III M.D   On: 10/15/2020 12:08   DG Chest Port 1 View  Result Date: 10/18/2020 CLINICAL DATA:  30 year old female Port-A-Cath placement. Left breast cancer. EXAM: PORTABLE CHEST 1 VIEW COMPARISON:  Chest CT 10/13/2020. FINDINGS: Portable AP upright view at 0950 hours. Right chest IJ approach Port-A-Cath in place. Catheter  tip projects at the lower SVC level just below the carina. No pneumothorax. Lung volumes and mediastinal contours are normal. Allowing for portable technique the lungs are clear. Stable visualized osseous structures. IMPRESSION: Right chest Port-A-Cath placed with no adverse features. Electronically Signed   By: Genevie Ann M.D.   On: 10/18/2020 10:26   DG Fluoro Guide CV Line-No Report  Result Date: 10/18/2020 Fluoroscopy was utilized by the requesting physician.  No radiographic interpretation.   ECHOCARDIOGRAM COMPLETE  Result Date: 10/13/2020    ECHOCARDIOGRAM REPORT   Patient Name:   Gabriela Sullivan Date of Exam: 10/13/2020 Medical Rec #:  [id number]  Height:       60.0 in Accession #:    [id number] Weight:       200.0 lb Date of Birth:  12/24/1990   BSA:          1.867 m Patient Age:    29 years   BP:           145/63 mmHg Patient Gender: F          HR:           69 bpm. Exam Location:  Outpatient Procedure: 2D Echo, 3D Echo, Cardiac Doppler, Color Doppler and Strain Analysis                                 MODIFIED REPORT:     This report was modified by Lyman Bishop MD on 10/13/2020 due to Updated                               diastolic function.  Indications:     Z51.11 Encounter for antineoplastic chemotheraphy  History:         Patient has no prior history of Echocardiogram examinations.                  Breast Cancer.  Sonographer:     Jonelle Sidle Dance Referring Phys:  [place] Diagnosing Phys: Lyman Bishop MD IMPRESSIONS  1. Left ventricular ejection fraction, by estimation, is 60 to 65%. The left ventricle has normal function.  The left ventricle has no regional wall motion abnormalities. Left ventricular diastolic parameters are normal. The average left ventricular global longitudinal strain is -18.6 %. The global longitudinal strain is low normal.  2. Right ventricular systolic function is normal. The right ventricular size is normal.  3. The mitral valve is abnormal. Trivial mitral valve regurgitation.  4. The aortic valve is tricuspid. Aortic valve regurgitation is not visualized.  5. The inferior vena cava is normal in size with greater than 50% respiratory variability, suggesting right atrial pressure of 3 mmHg. Comparison(s): No prior Echocardiogram. FINDINGS  Left Ventricle: Left ventricular ejection fraction, by estimation, is 60 to 65%. The left ventricle has normal function. The left ventricle has no regional wall motion abnormalities. The average left ventricular global longitudinal strain is -18.6 %. The global longitudinal strain is normal. 3D left ventricular ejection fraction analysis performed but not reported based on interpreter judgement due to suboptimal quality. The left ventricular internal cavity size was normal in size. There is no left ventricular hypertrophy. Left ventricular diastolic parameters were normal. Indeterminate filling pressures. Right Ventricle: The right ventricular size is normal. No increase in right ventricular wall thickness. Right ventricular systolic function is normal. Left Atrium: Left atrial size was normal in  size. Right Atrium: Right atrial size was normal in size. Pericardium: There is no evidence of pericardial effusion. Mitral Valve: The mitral valve is abnormal. There is mild calcification of the mitral valve leaflet(s). Trivial mitral valve regurgitation. Tricuspid Valve: The tricuspid valve is grossly normal. Tricuspid valve regurgitation is trivial. Aortic Valve: The aortic valve is tricuspid. Aortic valve regurgitation is not visualized. Pulmonic Valve: The pulmonic valve was  normal in structure. Pulmonic valve regurgitation is not visualized. Aorta: The aortic root and ascending aorta are structurally normal, with no evidence of dilitation. Venous: The inferior vena cava is normal in size with greater than 50% respiratory variability, suggesting right atrial pressure of 3 mmHg. IAS/Shunts: No atrial level shunt detected by color flow Doppler.  LEFT VENTRICLE PLAX 2D LVIDd:         3.80 cm  Diastology LVIDs:         2.50 cm  LV e' medial:    6.64 cm/s LV PW:         1.10 cm  LV E/e' medial:  14.6 LV IVS:        1.00 cm  LV e' lateral:   9.68 cm/s LVOT diam:     1.80 cm  LV E/e' lateral: 10.0 LV SV:         56 LV SV Index:   30       2D Longitudinal Strain LVOT Area:     2.54 cm 2D Strain GLS (A2C):   -21.1 %                         2D Strain GLS (A3C):   -18.1 %                         2D Strain GLS (A4C):   -16.6 %                         2D Strain GLS Avg:     -18.6 %                          3D Volume EF:                         3D EF:        70 %                         LV EDV:       113 ml                         LV ESV:       34 ml                         LV SV:        79 ml RIGHT VENTRICLE             IVC RV Basal diam:  2.50 cm     IVC diam: 1.00 cm RV S prime:     11.10 cm/s TAPSE (M-mode): 1.8 cm LEFT ATRIUM             Index       RIGHT ATRIUM           Index LA diam:  3.90 cm 2.09 cm/m  RA Area:     11.50 cm LA Vol (A2C):   36.3 ml 19.45 ml/m RA Volume:   23.90 ml  12.80 ml/m LA Vol (A4C):   27.4 ml 14.68 ml/m LA Biplane Vol: 31.9 ml 17.09 ml/m  AORTIC VALVE LVOT Vmax:   80.20 cm/s LVOT Vmean:  60.200 cm/s LVOT VTI:    0.220 m  AORTA Ao Root diam: 2.50 cm Ao Asc diam:  2.80 cm MITRAL VALVE MV Area (PHT): 3.36 cm    SHUNTS MV Decel Time: 226 msec    Systemic VTI:  0.22 m MV E velocity: 96.70 cm/s  Systemic Diam: 1.80 cm MV A velocity: 77.10 cm/s MV E/A ratio:  1.25 Lyman Bishop MD Electronically signed by Lyman Bishop MD Signature Date/Time: 10/13/2020/9:31:45  AM    Final (Updated)    MR TOTAL SPINE METS SCREENING  Result Date: 10/21/2020 CLINICAL DATA:  30 year old female with breast cancer. CT evidence of osseous metastatic disease. EXAM: MRI TOTAL SPINE WITHOUT AND WITH CONTRAST TECHNIQUE: Multisequence MR imaging of the spine from the cervical spine to the sacrum was performed prior to and following IV contrast administration for evaluation of spinal metastatic disease. CONTRAST:  44mL GADAVIST GADOBUTROL 1 MMOL/ML IV SOLN COMPARISON:  Whole-body bone scan 10/13/2020.  CT Chest 10/13/2020. FINDINGS: MRI CERVICAL SPINE FINDINGS Alignment: Mild straightening of lordosis.  No spondylolisthesis. Vertebrae: Mildly heterogeneous marrow signal, but no marrow edema or suspicious vertebral enhancement. Cord: Appears normal. No dural thickening or abnormal intradural enhancement identified. Posterior Fossa, vertebral arteries, paraspinal tissues: Cervicomedullary junction and visible brain parenchyma appear grossly negative. Negative visible paraspinal soft tissues. Disc levels: No significant degenerative changes.  No cervical spinal stenosis. MRI THORACIC SPINE FINDINGS Segmentation:  Normal. Alignment:  Relatively normal cervical lordosis. Vertebrae: Abnormal enhancement and marrow edema within the left T2 pars and spinous process (series 21 images 10 through 13 and series 25, images 10 and 12) corresponds to a lucent osseous lesion on the recent chest CT. Subtle small round enhancing T4 vertebral body metastasis also suspected on series 25, image 8. Subtle right T6 transverse process metastasis also suspected on series 25, image 3. Heterogeneously enhancing 10 mm metastasis in the anterior lower T10 body was heterogeneous on the recent CT. 11 mm right side T11 and subtle 6 mm left side T11 enhancing metastases were lytic on the comparison CT. And there is heterogeneous metastatic involvement of the T12 vertebra especially the spinous process (series 26, image 10). No  extraosseous extension of tumor. Cord: No thoracic cord signal abnormality. No abnormal intradural enhancement or dural thickening. Conus medullaris appears normal at T12-L1. Paraspinal and other soft tissues: Negative. Disc levels: Multiple degenerative disc protrusions in the thoracic spine at T6-T7 (left paracentral series 33, image 6), T7-T8 (left paracentral image 10), and T9-T10 (moderate right paracentral series 30, image 11 and series 33, image 23) result in mild degenerative spinal stenosis with up to mild cord mass effect at the latter. MRI LUMBAR SPINE FINDINGS Segmentation:  Normal. Alignment:  Preserved lumbar lordosis. Vertebrae: Subtle anterior L2 vertebral body enhancing metastasis suspected on series 26, image 10, 7 mm. Small L4 posteroinferior 9 mm enhancing metastasis. Subtle L5 6-7 mm mildly enhancing metastasis (series 26, image 8 and series 22, image 8). No extraosseous extension of tumor. Conus medullaris: Extends to the T12-L1 level and appears normal. No abnormal intradural enhancement. No dural thickening. Paraspinal and other soft tissues: Negative. Disc levels: No significant degenerative changes. Other findings: Confluent  sacral metastatic disease at the central S2 body and extending into the right sacral ala toward the right SI joint. See series 26, image 9 and series 22 images 3 through 9. IMPRESSION: 1. No cervical vertebral metastasis. Widely scattered thoracic and lumbar vertebral body and posterior element metastases. These are generally small, with no extraosseous or epidural space extension. Confluent sacral metastasis extends from the S2 body into the right sacral ala. 2. No spinal cord or cauda equina metastasis. No malignant spinal stenosis. 3. Multiple degenerative thoracic disc herniations, with mild degenerative spinal stenosis and mild spinal cord mass effect at the T9-T10 level, but no cord signal abnormality. Electronically Signed   By: Genevie Ann M.D.   On: 10/21/2020  10:28     ASSESSMENT/PLAN:  ELIGIBLE FOR AVAILABLE RESEARCH PROTOCOL:   ASSESSMENT: 30 y.o. [place] 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 as follows:              (0) status post left breast biopsies x2 and left axilla lymph node biopsy 09/26/2020 for a clinical T3 N1(f), stage IIa invasive ductal carcinoma, grade 2, estrogen and progesterone receptor positive, HER2 not amplified, with an MIB-1 in the 10-15% range             (a) right breast biopsy 09/25/2020 showed ductal carcinoma in situ, grade 1, estrogen and progesterone receptor positive             (b) breast MRI 10/06/2020 shows a 11 cm area of malignancy, with a second 1.9 cm mass, and skin thickening concerning for inflammatory breast carcinoma; at least 6 abnormal lymph nodes noted             (c) chest CT scan 10/13/2020 shows clear lungs and no obvious liver involvement but multiple lytic bone lesions in addition to the findings and MRI above             (d) bone scan 10/13/2020 shows nonspecific findings (lytic lesions not expected to be "hot")             (e) total spinal MRI 10/21/2020 shows 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  No cervical vertebral metastases noted.  There is also degenerative disc disease.  (1) genetics testing  (2) neoadjuvant chemotherapy consisting of cyclophosphamide and doxorubicin in dose dense fashion x4 starting 10/24/2020, possibly followed by paclitaxel weekly x12             (a) echo 10/13/2020 showed an ejection fraction in the 60-65% range  (3) definitive surgery for optimal local control pending  (4) adjuvant radiation to the left side to follow  (5) antiestrogens to follow at the completion of local treatment             (a) goserelin started 10/11/2020  (6)  zolendronate to start 11/02/2018  PLAN: This is a very pleasant 30 year old female diagnosed with breast cancer.  She is here today for day 1 of cycle #2.  Labs were reviewed with the patient.  Recommend that she proceed on the same treatment at the same dose.  She will also receive goserelin today.   We will see her back for follow-up visit in 2 weeks for evaluation before starting cycle #3.  She does not have any mouth sores at this time; however, I gave her a prescription for magic  mouth wash should she develop mucositis in the interval. She managed it last time with salt water rinses.   I reviewed she can take claritin for the myalgias/arthralgias. She is aware of this but stated she dose not like to take medication and she was able to tolerate it without this.   Her LFTs are slightly elevated. It appears similar to when she was treated 2 weeks ago. She is ok to treat today with LFTs. We will make sure the patient knows to avoid excessive tylenol and to avoid alcohol.   The patient was advised to call immediately if she has any concerning symptoms in the interval. The patient voices understanding of current disease status and treatment options and is in agreement with the current care plan. All questions were answered. The patient knows to call the clinic with any problems, questions or concerns. We can certainly see the patient much sooner if necessary      No orders of the defined types were placed in this encounter.    I spent 20-29 minutes in this encounter.   Shany Marinez L Margel Joens, PA-C 11/08/20

## 2020-11-08 ENCOUNTER — Ambulatory Visit: Payer: Self-pay

## 2020-11-08 ENCOUNTER — Inpatient Hospital Stay (HOSPITAL_BASED_OUTPATIENT_CLINIC_OR_DEPARTMENT_OTHER): Payer: Medicaid Other | Admitting: Physician Assistant

## 2020-11-08 ENCOUNTER — Inpatient Hospital Stay: Payer: Medicaid Other

## 2020-11-08 ENCOUNTER — Encounter: Payer: Self-pay | Admitting: Physician Assistant

## 2020-11-08 ENCOUNTER — Other Ambulatory Visit: Payer: Self-pay

## 2020-11-08 VITALS — BP 139/93 | HR 76 | Temp 97.9°F | Resp 17 | Ht 60.0 in | Wt 196.7 lb

## 2020-11-08 DIAGNOSIS — Z5111 Encounter for antineoplastic chemotherapy: Secondary | ICD-10-CM | POA: Diagnosis not present

## 2020-11-08 DIAGNOSIS — Z170 Estrogen receptor positive status [ER+]: Secondary | ICD-10-CM

## 2020-11-08 DIAGNOSIS — C50412 Malignant neoplasm of upper-outer quadrant of left female breast: Secondary | ICD-10-CM

## 2020-11-08 DIAGNOSIS — Z95828 Presence of other vascular implants and grafts: Secondary | ICD-10-CM

## 2020-11-08 LAB — COMPREHENSIVE METABOLIC PANEL
ALT: 106 U/L — ABNORMAL HIGH (ref 0–44)
AST: 50 U/L — ABNORMAL HIGH (ref 15–41)
Albumin: 4.2 g/dL (ref 3.5–5.0)
Alkaline Phosphatase: 107 U/L (ref 38–126)
Anion gap: 11 (ref 5–15)
BUN: 13 mg/dL (ref 6–20)
CO2: 25 mmol/L (ref 22–32)
Calcium: 9.5 mg/dL (ref 8.9–10.3)
Chloride: 106 mmol/L (ref 98–111)
Creatinine, Ser: 0.6 mg/dL (ref 0.44–1.00)
GFR, Estimated: >60 mL/min (ref >60)
Glucose, Bld: 135 mg/dL — ABNORMAL HIGH (ref 70–99)
Potassium: 3.8 mmol/L (ref 3.5–5.1)
Sodium: 142 mmol/L (ref 135–145)
Total Bilirubin: 0.4 mg/dL (ref 0.3–1.2)
Total Protein: 7.4 g/dL (ref 6.5–8.1)

## 2020-11-08 LAB — CBC WITH DIFFERENTIAL/PLATELET
Abs Immature Granulocytes: 0.8 10*3/uL — ABNORMAL HIGH (ref 0.00–0.07)
Basophils Absolute: 0.1 10*3/uL (ref 0.0–0.1)
Basophils Relative: 1 %
Eosinophils Absolute: 0 10*3/uL (ref 0.0–0.5)
Eosinophils Relative: 0 %
HCT: 39.8 % (ref 36.0–46.0)
Hemoglobin: 13.6 g/dL (ref 12.0–15.0)
Immature Granulocytes: 8 %
Lymphocytes Relative: 22 %
Lymphs Abs: 2.1 10*3/uL (ref 0.7–4.0)
MCH: 28.4 pg (ref 26.0–34.0)
MCHC: 34.2 g/dL (ref 30.0–36.0)
MCV: 83.1 fL (ref 80.0–100.0)
Monocytes Absolute: 0.6 10*3/uL (ref 0.1–1.0)
Monocytes Relative: 6 %
Neutro Abs: 5.9 10*3/uL (ref 1.7–7.7)
Neutrophils Relative %: 63 %
Platelets: 193 10*3/uL (ref 150–400)
RBC: 4.79 MIL/uL (ref 3.87–5.11)
RDW: 13 % (ref 11.5–15.5)
WBC: 9.5 10*3/uL (ref 4.0–10.5)
nRBC: 0.3 % — ABNORMAL HIGH (ref 0.0–0.2)

## 2020-11-08 MED ORDER — GOSERELIN ACETATE 3.6 MG ~~LOC~~ IMPL
3.6000 mg | DRUG_IMPLANT | Freq: Once | SUBCUTANEOUS | Status: AC
  Administered 2020-11-08: 3.6 mg via SUBCUTANEOUS

## 2020-11-08 MED ORDER — SODIUM CHLORIDE 0.9% FLUSH
10.0000 mL | INTRAVENOUS | Status: DC | PRN
  Administered 2020-11-08: 10 mL
  Filled 2020-11-08: qty 10

## 2020-11-08 MED ORDER — SODIUM CHLORIDE 0.9 % IV SOLN
Freq: Once | INTRAVENOUS | Status: AC
Start: 2020-11-08 — End: 2020-11-08
  Filled 2020-11-08: qty 250

## 2020-11-08 MED ORDER — PROCHLORPERAZINE EDISYLATE 10 MG/2ML IJ SOLN
10.0000 mg | Freq: Once | INTRAMUSCULAR | Status: AC
  Administered 2020-11-08: 10 mg via INTRAVENOUS

## 2020-11-08 MED ORDER — MAGIC MOUTHWASH: 5.0000 mL | Freq: Four times a day (QID) | ORAL | 0 refills | Status: DC | PRN

## 2020-11-08 MED ORDER — SODIUM CHLORIDE 0.9 % IV SOLN
600.0000 mg/m2 | Freq: Once | INTRAVENOUS | Status: AC
  Administered 2020-11-08: 1180 mg via INTRAVENOUS
  Filled 2020-11-08: qty 59

## 2020-11-08 MED ORDER — PROCHLORPERAZINE EDISYLATE 10 MG/2ML IJ SOLN
INTRAMUSCULAR | Status: AC
  Filled 2020-11-08: qty 2

## 2020-11-08 MED ORDER — DOXORUBICIN HCL CHEMO IV INJECTION 2 MG/ML
60.0000 mg/m2 | Freq: Once | INTRAVENOUS | Status: AC
  Administered 2020-11-08: 118 mg via INTRAVENOUS
  Filled 2020-11-08: qty 59

## 2020-11-08 MED ORDER — SODIUM CHLORIDE 0.9% FLUSH
10.0000 mL | INTRAVENOUS | Status: AC | PRN
  Administered 2020-11-08: 10 mL
  Filled 2020-11-08: qty 10

## 2020-11-08 MED ORDER — SODIUM CHLORIDE 0.9 % IV SOLN
150.0000 mg | Freq: Once | INTRAVENOUS | Status: AC
  Administered 2020-11-08: 150 mg via INTRAVENOUS
  Filled 2020-11-08: qty 150

## 2020-11-08 MED ORDER — HEPARIN SOD (PORK) LOCK FLUSH 100 UNIT/ML IV SOLN
500.0000 [IU] | Freq: Once | INTRAVENOUS | Status: AC | PRN
  Administered 2020-11-08: 500 [IU]
  Filled 2020-11-08: qty 5

## 2020-11-08 MED ORDER — SODIUM CHLORIDE 0.9 % IV SOLN
10.0000 mg | Freq: Once | INTRAVENOUS | Status: AC
  Administered 2020-11-08: 10 mg via INTRAVENOUS
  Filled 2020-11-08: qty 10

## 2020-11-08 MED ORDER — GOSERELIN ACETATE 3.6 MG ~~LOC~~ IMPL
DRUG_IMPLANT | SUBCUTANEOUS | Status: AC
  Filled 2020-11-08: qty 3.6

## 2020-11-08 NOTE — Patient Instructions (Addendum)
[facility]  Discharge Instructions: Thank you for choosing [facility] to provide your oncology and hematology care.   If you have a lab appointment with the [facility], please go directly to the [facility] and check in at the registration area.   Wear comfortable clothing and clothing appropriate for easy access to any Portacath or PICC line.   We strive to give you quality time with your provider. You may need to reschedule your appointment if you arrive late (15 or more minutes).  Arriving late affects you and other patients whose appointments are after yours.  Also, if you miss three or more appointments without notifying the office, you may be dismissed from the clinic at the provider's discretion.      For prescription refill requests, have your pharmacy contact our office and allow 72 hours for refills to be completed.    Today you received the following chemotherapy and/or immunotherapy agents: Adriamycin/Cytoxan.      To help prevent nausea and vomiting after your treatment, we encourage you to take your nausea medication as directed.  BELOW ARE SYMPTOMS THAT SHOULD BE REPORTED IMMEDIATELY: . *FEVER GREATER THAN 100.4 F (38 C) OR HIGHER . *CHILLS OR SWEATING . *NAUSEA AND VOMITING THAT IS NOT CONTROLLED WITH YOUR NAUSEA MEDICATION . *UNUSUAL SHORTNESS OF BREATH . *UNUSUAL BRUISING OR BLEEDING . *URINARY PROBLEMS (pain or burning when urinating, or frequent urination) . *BOWEL PROBLEMS (unusual diarrhea, constipation, pain near the anus) . TENDERNESS IN MOUTH AND THROAT WITH OR WITHOUT PRESENCE OF ULCERS (sore throat, sores in mouth, or a toothache) . UNUSUAL RASH, SWELLING OR PAIN  . UNUSUAL VAGINAL DISCHARGE OR ITCHING   Items with * indicate a potential emergency and should be followed up as soon as possible or go to the Emergency Department if any problems should occur.  Please show the CHEMOTHERAPY ALERT CARD or  IMMUNOTHERAPY ALERT CARD at check-in to the Emergency Department and triage nurse.  Should you have questions after your visit or need to cancel or reschedule your appointment, please contact [facility]  Dept: [phone]  and follow the prompts.  Office hours are 8:00 a.m. to 4:30 p.m. Monday - Friday. Please note that voicemails left after 4:00 p.m. may not be returned until the following business day.  We are closed weekends and major holidays. You have access to a nurse at all times for urgent questions. Please call the main number to the clinic Dept: [phone] and follow the prompts.   For any non-urgent questions, you may also contact your provider using MyChart. We now offer e-Visits for anyone 66 and older to request care online for non-urgent symptoms. For details visit [email or web address].   Also download the MyChart app! Go to the app store, search "MyChart", open the app, select [facility], and log in with your MyChart username and password.  Due to Covid, a mask is required upon entering the hospital/clinic. If you do not have a mask, one will be given to you upon arrival. For doctor visits, patients may have 1 support person aged 42 or older with them. For treatment visits, patients cannot have anyone with them due to current Covid guidelines and our immunocompromised population.   Goserelin injection What is this medicine? GOSERELIN (GOE se rel in) is similar to a hormone found in the body. It lowers the amount of sex hormones that the body makes. Men will have lower testosterone levels and women  will have lower estrogen levels while taking this medicine. In men, this medicine is used to treat prostate cancer; the injection is either given once per month or once every 12 weeks. A once per month injection (only) is used to treat women with endometriosis, dysfunctional uterine bleeding, or advanced breast cancer. This medicine may be used for other  purposes; ask your health care provider or pharmacist if you have questions. COMMON BRAND NAME(S): Zoladex What should I tell my health care provider before I take this medicine? They need to know if you have any of these conditions:  bone problems  diabetes  heart disease  history of irregular heartbeat  an unusual or allergic reaction to goserelin, other medicines, foods, dyes, or preservatives  pregnant or trying to get pregnant  breast-feeding How should I use this medicine? This medicine is for injection under the skin. It is given by a health care professional in a hospital or clinic setting. Talk to your pediatrician regarding the use of this medicine in children. Special care may be needed. Overdosage: If you think you have taken too much of this medicine contact a poison control center or emergency room at once. NOTE: This medicine is only for you. Do not share this medicine with others. What if I miss a dose? It is important not to miss your dose. Call your doctor or health care professional if you are unable to keep an appointment. What may interact with this medicine? Do not take this medicine with any of the following medications:  cisapride  dronedarone  pimozide  thioridazine This medicine may also interact with the following medications:  other medicines that prolong the QT interval (an abnormal heart rhythm) This list may not describe all possible interactions. Give your health care provider a list of all the medicines, herbs, non-prescription drugs, or dietary supplements you use. Also tell them if you smoke, drink alcohol, or use illegal drugs. Some items may interact with your medicine. What should I watch for while using this medicine? Visit your doctor or health care provider for regular checks on your progress. Your symptoms may appear to get worse during the first weeks of this therapy. Tell your doctor or healthcare provider if your symptoms do not  start to get better or if they get worse after this time. Your bones may get weaker if you take this medicine for a long time. If you smoke or frequently drink alcohol you may increase your risk of bone loss. A family history of osteoporosis, chronic use of drugs for seizures (convulsions), or corticosteroids can also increase your risk of bone loss. Talk to your doctor about how to keep your bones strong. This medicine should stop regular monthly menstruation in women. Tell your doctor if you continue to menstruate. Women should not become pregnant while taking this medicine or for 12 weeks after stopping this medicine. Women should inform their doctor if they wish to become pregnant or think they might be pregnant. There is a potential for serious side effects to an unborn child. Talk to your health care professional or pharmacist for more information. Do not breast-feed an infant while taking this medicine. Men should inform their doctors if they wish to father a child. This medicine may lower sperm counts. Talk to your health care professional or pharmacist for more information. This medicine may increase blood sugar. Ask your healthcare provider if changes in diet or medicines are needed if you have diabetes. What side effects may I notice  from receiving this medicine? Side effects that you should report to your doctor or health care professional as soon as possible:  allergic reactions like skin rash, itching or hives, swelling of the face, lips, or tongue  bone pain  breathing problems  changes in vision  chest pain  feeling faint or lightheaded, falls  fever, chills  pain, swelling, warmth in the leg  pain, tingling, numbness in the hands or feet  signs and symptoms of high blood sugar such as being more thirsty or hungry or having to urinate more than normal. You may also feel very tired or have blurry vision  signs and symptoms of low blood pressure like dizziness; feeling  faint or lightheaded, falls; unusually weak or tired  stomach pain  swelling of the ankles, feet, hands  trouble passing urine or change in the amount of urine  unusually high or low blood pressure  unusually weak or tired Side effects that usually do not require medical attention (report to your doctor or health care professional if they continue or are bothersome):  change in sex drive or performance  changes in breast size in both males and females  changes in emotions or moods  headache  hot flashes  irritation at site where injected  loss of appetite  skin problems like acne, dry skin  vaginal dryness This list may not describe all possible side effects. Call your doctor for medical advice about side effects. You may report side effects to FDA at 1-800-FDA-1088. Where should I keep my medicine? This drug is given in a hospital or clinic and will not be stored at home. NOTE: This sheet is a summary. It may not cover all possible information. If you have questions about this medicine, talk to your doctor, pharmacist, or health care provider.  2021 [employer]/Gold Standard (2018-09-07 14:05:56)

## 2020-11-08 NOTE — Progress Notes (Signed)
Per Cassie Heilingoetter, okay to treat with elevated LFTs.

## 2020-11-10 ENCOUNTER — Other Ambulatory Visit: Payer: Self-pay

## 2020-11-10 ENCOUNTER — Inpatient Hospital Stay: Payer: Medicaid Other

## 2020-11-10 VITALS — BP 128/69 | HR 76 | Temp 98.6°F | Resp 18

## 2020-11-10 DIAGNOSIS — Z5111 Encounter for antineoplastic chemotherapy: Secondary | ICD-10-CM | POA: Diagnosis not present

## 2020-11-10 DIAGNOSIS — C50412 Malignant neoplasm of upper-outer quadrant of left female breast: Secondary | ICD-10-CM

## 2020-11-10 MED ORDER — PEGFILGRASTIM-CBQV 6 MG/0.6ML ~~LOC~~ SOSY
6.0000 mg | PREFILLED_SYRINGE | Freq: Once | SUBCUTANEOUS | Status: AC
  Administered 2020-11-10: 6 mg via SUBCUTANEOUS

## 2020-11-10 MED ORDER — PEGFILGRASTIM-CBQV 6 MG/0.6ML ~~LOC~~ SOSY
PREFILLED_SYRINGE | SUBCUTANEOUS | Status: AC
  Filled 2020-11-10: qty 0.6

## 2020-11-10 NOTE — Patient Instructions (Signed)

## 2020-11-14 ENCOUNTER — Other Ambulatory Visit: Payer: Self-pay | Admitting: Oncology

## 2020-11-16 ENCOUNTER — Telehealth: Payer: Self-pay | Admitting: Genetic Counselor

## 2020-11-16 ENCOUNTER — Other Ambulatory Visit: Payer: Self-pay | Admitting: Oncology

## 2020-11-16 NOTE — Telephone Encounter (Signed)
LVM that we still do not have her genetic test results back and the laboratory [facility]) has placed her test on hold. Requested that she call back to discuss.

## 2020-11-17 ENCOUNTER — Encounter: Payer: Self-pay | Admitting: *Deleted

## 2020-11-17 NOTE — Telephone Encounter (Signed)
Discussed updates to genetic testing - the OOP cost through [facility] will be $250 since she is currently uninsured, rather than the $100 that we had discussed previously. Gabriela Sullivan understands and is comfortable proceeding with testing. Her results will be available in approximately two-three weeks, and we will call once they are available.

## 2020-11-17 NOTE — Progress Notes (Signed)
[facility] OFFICE PROGRESS NOTE  Gabriela Orleans, MD [place] [address]  DIAGNOSIS: Inflammatory estrogen receptor positive breast cancer  CURRENT THERAPY: Neoadjuvant chemotherapy; goserelin; zoledronate. Today is day 1 cycle #3.   INTERVAL HISTORY: Gabriela Sullivan 30 y.o. female returns to the clinic today for a follow-up visit.  The patient is status post two cycles of chemotherapy and she tolerated it fairly well. She went to the beach last week with her friends. She was able to "keep up" with them but felt fatigued the following day. She states she is feeling well today except for the progressive fatigue. She still continues to work.   She previously had a headache with cycle #1 which Dr. Jana Hakim attributed to her palonsetron which he has discontinued. She sometimes has a mild headache in the morning but these resolve shortly after waking up without any intervention. She had some arthralgias from the neulasta injection but is able to tolerate it well without claritin. She states she does not like taking medications. She previously had mouth sores with cycle #1 which resolved with salt water rinses but she has a prescription for magic mouthwash if needed. She did not develop any mouth sores with cycle #2. She denies any fever, chills, night sweats, or weight loss.  She denies any chest pain or shortness of breath. She sometimes gets a cough at work when she is being active but it resolves after a few minutes with drinking water.  She denies any peripheral neuropathy.  She denies any vomiting, diarrhea, or constipation but has some mild nausea. The redness at the breast cancer site has resolved. She sees Dr. Brantley Stage on 12/11/20 to discuss possible future surgery. She is thinking about a bilateral mastectomy. Her genetics was not sent out due to lack of insurance but she now has insurance. She does not mind paying out of pocket for the genetics test. She is here  today for evaluation before starting cycle #2.   COVID 19 VACCINATION STATUS: refuses vaccination    HISTORY OF CURRENT ILLNESS: From the original intake note:   Gabriela Sullivan noted a marble sized lump in her left breast in 2020.  She ignored it.  Around September 2021 this mass was "golf sized" and she brought it to her mother's attention.  She did not bring it to medical attention until several months later and she finally underwent bilateral diagnostic mammography with tomography and left breast ultrasonography at [facility] on 09/12/2020 showing: breast density category C; 5.1 cm ill-defined outer left breast mass corresponding to patient's palpable abnormality; at least 4 abnormal left axillary lymph nodes with cortical thickening; indeterminate 0.8 cm group of outer right breast calcifications; indeterminate 6 cm upper-outer left breast calcifications.   Accordingly on 09/26/2020 she proceeded to biopsy of the bilateral breast areas in question. The pathology from this procedure ([id number]) showed: 1. Left Breast, upper-outer             - invasive ductal carcinoma, grade 2.             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 Proliferation marker Ki67 at 10%. HER2 equivocal by immunohistochemistry (2+), but negative by fluorescent in situ hybridization with a signals ratio 1.61 and number per cell  2.65.   2. Right Breast, upper-outer             - low grade ductal carcinoma in situ             - Prognostic indicators significant for: estrogen receptor, >95% positive and progesterone receptor, >95% positive, both with strong staining intensity.   She underwent additional biopsies on 09/26/2020. Pathology from the procedure ([id number]) showed: 1. Left Breast, 3 o'clock             -  invasive ductal carcinoma, grade 2             - ductal carcinoma in situ             - Prognostic indicators significant for: estrogen receptor, 60% positive with moderate staining intensity and progesterone receptor, 10% positive with strong staining intensity. Proliferation marker Ki67 at 15%. HER2 equivocal by immunohistochemistry (2+), but negative by fluorescent in situ hybridization with a signals ratio 1.66 and number per cell 2.65. 2. Left Axilla Lymph Node             - metastatic carcinoma   Cancer Staging Ductal carcinoma in situ (DCIS) of right breast Staging form: Breast, AJCC 8th Edition - Clinical: Stage 0 (cTis (DCIS), cN0, cM0) - Signed by Chauncey Cruel, MD on 10/04/2020   Malignant neoplasm of upper-outer quadrant of left breast in female, estrogen receptor positive ([place]) Staging form: Breast, AJCC 8th Edition - Clinical stage from 10/04/2020: Stage IIA (cT3, cN1(f), cM0, G2, ER+, PR+, HER2-) - Signed by Chauncey Cruel, MD on 10/04/2020 Stage prefix: Initial diagnosis Method of lymph node assessment: Core biopsy Histologic grading system: 3 grade system   The patient's subsequent history is as detailed below.  MEDICAL HISTORY: Past Medical History:  Diagnosis Date   Asthma    remote   Breast cancer ([place])    Family history of breast cancer    Family history of ovarian cancer    Family history of prostate cancer    Family history of stomach cancer    Migraines    PCOS (polycystic ovarian syndrome)     ALLERGIES:  has No Known Allergies.  MEDICATIONS:  Current Outpatient Medications  Medication Sig Dispense Refill   amitriptyline (ELAVIL) 50 MG tablet Take 50 mg by mouth at bedtime.     dexamethasone (DECADRON) 4 MG tablet Take 2 tablets (8 mg total) by mouth daily. Take daily for 3 days after chemo. Take with food. 30 tablet 1   Goserelin Acetate (ZOLADEX [place]) Inject into the skin once a week.     ibuprofen (ADVIL) 800 MG tablet Take 1 tablet (800 mg  total) by mouth every 8 (eight) hours as needed. 30 tablet 0   lidocaine-prilocaine (EMLA) cream Apply to affected area once 30 g 3   magic mouthwash SOLN Take 5 mLs by mouth 4 (four) times daily as needed for mouth pain. 240 mL 0   oxyCODONE (OXY IR/ROXICODONE) 5 MG immediate release tablet Take 1 tablet (5 mg total) by mouth every 6 (six) hours as needed for severe pain. 15 tablet 0   prochlorperazine (COMPAZINE) 10 MG tablet Take 1 tablet (10 mg total) by mouth every 6 (six) hours as needed (Nausea or vomiting). 30 tablet 1   UDENYCA 6 MG/0.6ML injection Inject into the skin.     Vitamin D, Ergocalciferol, (DRISDOL) 50000 UNITS CAPS capsule Take 50,000 Units by mouth every 7 (seven) days.     No current facility-administered  medications for this visit.    SURGICAL HISTORY:  Past Surgical History:  Procedure Laterality Date   NO PAST SURGERIES     PORTACATH PLACEMENT Right 10/18/2020   Procedure: INSERTION PORT-A-CATH;  Surgeon: Erroll Luna, MD;  Location: [facility];  Service: General;  Laterality: Right;   GYNECOLOGIC HISTORY:  No LMP recorded. (Menstrual status: Irregular Periods). Menarche: 30 years old [place] P 0 (s/p miscarriage in 2013) LMP 1/26 - 07/03/2020, irregular, goes 2-6 months between periods Contraceptive never used HRT n/a  Hysterectomy? no BSO? no     SOCIAL HISTORY: (updated 10/2020)  Gabriela Sullivan is currently working as a Freight forwarder at [employer] in [place]. She is single. She lives at home with her mother (who is a stay-at-home mom), their two dogs, and one cat. She is not a Designer, fashion/clothing.                          ADVANCED DIRECTIVES: not in place; at the 10/04/2020 visit the patient was given the appropriate documents to complete and notarized at her discretion  REVIEW OF SYSTEMS:   Review of Systems  Constitutional: Positive for fatigue. Negative for appetite change, chills, fever and unexpected weight change.  HENT: Negative for  mouth sores, nosebleeds, sore throat and trouble swallowing.   Eyes: Negative for eye problems and icterus.  Respiratory: Positive for mild intermittent cough which resolves with drinking water. Negative for hemoptysis, shortness of breath and wheezing.   Cardiovascular: Negative for chest pain and leg swelling.  Gastrointestinal: Positive for mild nausea. Negative for abdominal pain, constipation, diarrhea, and vomiting.  Genitourinary: Negative for bladder incontinence, difficulty urinating, dysuria, frequency and hematuria.   Musculoskeletal: Negative for back pain, gait problem, neck pain and neck stiffness.  Skin: Negative for itching and rash.  Neurological: Negative for dizziness, extremity weakness, gait problem, headaches, light-headedness and seizures.  Hematological: Negative for adenopathy. Does not bruise/bleed easily.  Psychiatric/Behavioral: Negative for confusion, depression and sleep disturbance. The patient is not nervous/anxious.     PHYSICAL EXAMINATION:  Blood pressure 127/87, pulse 80, temperature 97.9 F (36.6 C), temperature source Tympanic, resp. rate 18, height 5' (1.524 m), weight 196 lb 3.2 oz (89 kg), SpO2 97 %.  ECOG PERFORMANCE STATUS: 1  Physical Exam  Constitutional: Oriented to person, place, and time and well-developed, well-nourished, and in no distress.  HENT:  Head: Normocephalic and atraumatic.  Mouth/Throat: Oropharynx is clear and moist. No oropharyngeal exudate.  Eyes: Conjunctivae are normal. Right eye exhibits no discharge. Left eye exhibits no discharge. No scleral icterus.  Neck: Normal range of motion. Neck supple.  Cardiovascular: Normal rate, regular rhythm, normal heart sounds and intact distal pulses.   Pulmonary/Chest: Effort normal and breath sounds normal. No respiratory distress. No wheezes. No rales.  Abdominal: Soft. Bowel sounds are normal. Exhibits no distension and no mass. There is no tenderness.  Musculoskeletal: Normal range  of motion. Exhibits no edema.  Lymphadenopathy:    No cervical adenopathy.  Neurological: Alert and oriented to person, place, and time. Exhibits normal muscle tone. Gait normal. Coordination normal.  Skin: Skin is warm and dry. No rash noted. Not diaphoretic. No erythema. No pallor.  Psychiatric: Mood, memory and judgment normal.  Breast: Palpable mass in left breast but the erythema and skin discoloration has improved.  Vitals reviewed.  LABORATORY DATA: Lab Results  Component Value Date   WBC 7.7 11/22/2020   HGB 12.0 11/22/2020   HCT 35.0 (L)  11/22/2020   MCV 82.4 11/22/2020   PLT 183 11/22/2020      Chemistry      Component Value Date/Time   NA 142 11/22/2020 0956   K 3.8 11/22/2020 0956   CL 106 11/22/2020 0956   CO2 26 11/22/2020 0956   BUN 13 11/22/2020 0956   CREATININE 0.56 11/22/2020 0956   CREATININE 0.64 10/04/2020 0755      Component Value Date/Time   CALCIUM 9.0 11/22/2020 0956   ALKPHOS 101 11/22/2020 0956   AST 80 (H) 11/22/2020 0956   AST 47 (H) 10/04/2020 0755   ALT 155 (H) 11/22/2020 0956   ALT 108 (H) 10/04/2020 0755   BILITOT 0.5 11/22/2020 0956   BILITOT 0.7 10/04/2020 0755       RADIOGRAPHIC STUDIES:  No results found.   ASSESSMENT/PLAN:  ELIGIBLE FOR AVAILABLE RESEARCH PROTOCOL:   ASSESSMENT: 30 y.o. [place] 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 as follows:             (0) status post left breast biopsies x2 and left axilla lymph node biopsy 09/26/2020 for a clinical T3 N1(f), stage IIa invasive ductal carcinoma, grade 2, estrogen and progesterone receptor positive, HER2 not amplified, with an MIB-1 in the 10-15% range             (a) right breast biopsy 09/25/2020 showed ductal carcinoma in situ, grade 1, estrogen and progesterone receptor positive             (b) breast MRI 10/06/2020 shows a 11 cm area of malignancy, with a second 1.9 cm mass, and skin thickening concerning for inflammatory breast  carcinoma; at least 6 abnormal lymph nodes noted             (c) chest CT scan 10/13/2020 shows clear lungs and no obvious liver involvement but multiple lytic bone lesions in addition to the findings and MRI above             (d) bone scan 10/13/2020 shows nonspecific findings (lytic lesions not expected to be "hot")             (e) total spinal MRI 10/21/2020 shows 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  No cervical vertebral metastases noted.  There is also degenerative disc disease.   (1) genetics testing   (2) neoadjuvant chemotherapy consisting of cyclophosphamide and doxorubicin in dose dense fashion x4 starting 10/24/2020, possibly followed by paclitaxel weekly x12             (a) echo 10/13/2020 showed an ejection fraction in the 60-65% range   (3) definitive surgery for optimal local control pending   (4) adjuvant radiation to the left side to follow   (5) antiestrogens to follow at the completion of local treatment             (a) goserelin started 10/11/2020   (6) zolendronate to start 11/02/2018   PLAN: This is a very pleasant 30 year old female diagnosed with breast cancer.  She is here today for day 1 of cycle #3.  The patient was seen with Dr. Jana Hakim today.   Her LFTs continue to be progressively more elevated. Her AST 80 and her ALT is 155 today. Her bili and Alk phos are within normal limits. She denies alcohol or tylenol use. She denies abdominal pain. Dr.  Magrinat recommends that she delay the start of cycle #3 by 1 week to give her liver a break.   I will send a high priority scheduling message to reschedule cycle #3 next week on 11/29/20. This means she will return for cycle #4 on 12/13/20. I will ensure that if she is on my schedule that this is a shared visit with Dr. Jana Hakim since  she will have recently seen her surgeon on 7/11.   I reached out to genetic counseling to let them know the patient has insurance and to move forward with sending out her genetic testing. It appears from reviewing the 11/17/20 note that they may have sent out her genetic testing but I have reached out just to make sure.   Dr. Jana Hakim also spoke to the patient about her thoughts on bilateral mastectomy vs unilateral. She has been leaning towards a bilateral mastectomy, especially if her genetic testing shows she is at risk for future breast cancer. She will discuss with Dr. Brantley Stage at her appointment on 12/11/20   The patient was advised to call immediately if she has any concerning symptoms in the interval. The patient voices understanding of current disease status and treatment options and is in agreement with the current care plan. All questions were answered. The patient knows to call the clinic with any problems, questions or concerns. We can certainly see the patient much sooner if necessary      No orders of the defined types were placed in this encounter.     [place], PA-C 11/22/20   ADDENDUM: Corleen is tolerating her chemotherapy well and that we are seeing a signs of response.  The breast is now no longer erythematous.  The mass is clearly smaller although still palpable in the lateral aspect of the breast.  Because of her slight increase in her liver function test we are going to give her a week off.  She will then have her third cycle week from today and her fourth 1 2 weeks later, 3 weeks from today.  She already has an appointment with her surgeon before her last chemo cycle.  She tells me she is very intent on bilateral mastectomies regardless of the genetics results but the genetics test was sent today.  She will be interested in reconstruction at some point but seems to be agreeable to waiting on that if necessary.  In any case she will be advised regarding  that by Dr. Brantley Stage when he sees her in July  I personally saw this patient and performed a substantive portion of this encounter with the listed APP documented above.    Total encounter time 25 minutes.Chauncey Cruel, MD Medical Oncology and Hematology [facility] [address] [place], [place] [address] Tel. [phone]    Fax. [phone]  *Total Encounter Time as defined by the Centers for Medicare and Medicaid Services includes, in addition to the face-to-face time of a patient visit (documented in the note above) non-face-to-face time: obtaining and reviewing outside history, ordering and reviewing medications, tests or procedures, care coordination (communications with other health care professionals or caregivers) and documentation in the medical record.

## 2020-11-20 ENCOUNTER — Other Ambulatory Visit: Payer: Self-pay | Admitting: Oncology

## 2020-11-21 MED FILL — Dexamethasone Sodium Phosphate Inj 100 MG/10ML: INTRAMUSCULAR | Qty: 1 | Status: AC

## 2020-11-21 MED FILL — Fosaprepitant Dimeglumine For IV Infusion 150 MG (Base Eq): INTRAVENOUS | Qty: 5 | Status: AC

## 2020-11-22 ENCOUNTER — Inpatient Hospital Stay (HOSPITAL_BASED_OUTPATIENT_CLINIC_OR_DEPARTMENT_OTHER): Payer: Medicaid Other | Admitting: Physician Assistant

## 2020-11-22 ENCOUNTER — Inpatient Hospital Stay: Payer: Medicaid Other

## 2020-11-22 ENCOUNTER — Ambulatory Visit: Payer: Self-pay

## 2020-11-22 ENCOUNTER — Ambulatory Visit: Payer: Self-pay | Admitting: Oncology

## 2020-11-22 ENCOUNTER — Encounter: Payer: Self-pay | Admitting: Physician Assistant

## 2020-11-22 ENCOUNTER — Other Ambulatory Visit: Payer: Self-pay

## 2020-11-22 VITALS — BP 127/87 | HR 80 | Temp 97.9°F | Resp 18 | Ht 60.0 in | Wt 196.2 lb

## 2020-11-22 DIAGNOSIS — Z170 Estrogen receptor positive status [ER+]: Secondary | ICD-10-CM

## 2020-11-22 DIAGNOSIS — C50412 Malignant neoplasm of upper-outer quadrant of left female breast: Secondary | ICD-10-CM

## 2020-11-22 DIAGNOSIS — Z5111 Encounter for antineoplastic chemotherapy: Secondary | ICD-10-CM | POA: Diagnosis not present

## 2020-11-22 LAB — CBC WITH DIFFERENTIAL/PLATELET
Abs Immature Granulocytes: 0.42 10*3/uL — ABNORMAL HIGH (ref 0.00–0.07)
Basophils Absolute: 0.1 10*3/uL (ref 0.0–0.1)
Basophils Relative: 1 %
Eosinophils Absolute: 0 10*3/uL (ref 0.0–0.5)
Eosinophils Relative: 0 %
HCT: 35 % — ABNORMAL LOW (ref 36.0–46.0)
Hemoglobin: 12 g/dL (ref 12.0–15.0)
Immature Granulocytes: 6 %
Lymphocytes Relative: 17 %
Lymphs Abs: 1.3 10*3/uL (ref 0.7–4.0)
MCH: 28.2 pg (ref 26.0–34.0)
MCHC: 34.3 g/dL (ref 30.0–36.0)
MCV: 82.4 fL (ref 80.0–100.0)
Monocytes Absolute: 0.5 10*3/uL (ref 0.1–1.0)
Monocytes Relative: 7 %
Neutro Abs: 5.4 10*3/uL (ref 1.7–7.7)
Neutrophils Relative %: 69 %
Platelets: 183 10*3/uL (ref 150–400)
RBC: 4.25 MIL/uL (ref 3.87–5.11)
RDW: 13.6 % (ref 11.5–15.5)
WBC: 7.7 10*3/uL (ref 4.0–10.5)
nRBC: 0.7 % — ABNORMAL HIGH (ref 0.0–0.2)

## 2020-11-22 LAB — COMPREHENSIVE METABOLIC PANEL
ALT: 155 U/L — ABNORMAL HIGH (ref 0–44)
AST: 80 U/L — ABNORMAL HIGH (ref 15–41)
Albumin: 3.8 g/dL (ref 3.5–5.0)
Alkaline Phosphatase: 101 U/L (ref 38–126)
Anion gap: 10 (ref 5–15)
BUN: 13 mg/dL (ref 6–20)
CO2: 26 mmol/L (ref 22–32)
Calcium: 9 mg/dL (ref 8.9–10.3)
Chloride: 106 mmol/L (ref 98–111)
Creatinine, Ser: 0.56 mg/dL (ref 0.44–1.00)
GFR, Estimated: >60 mL/min (ref >60)
Glucose, Bld: 156 mg/dL — ABNORMAL HIGH (ref 70–99)
Potassium: 3.8 mmol/L (ref 3.5–5.1)
Sodium: 142 mmol/L (ref 135–145)
Total Bilirubin: 0.5 mg/dL (ref 0.3–1.2)
Total Protein: 6.9 g/dL (ref 6.5–8.1)

## 2020-11-22 NOTE — Progress Notes (Signed)
Please see my addendum to the separate note on this patient

## 2020-11-23 NOTE — Progress Notes (Signed)
[facility] OFFICE PROGRESS NOTE  Gabriela Orleans, MD [place] [address]  DIAGNOSIS: Inflammatory estrogen receptor positive breast cancer  CURRENT THERAPY: Neoadjuvant chemotherapy; goserelin; zoledronate. Today is day 1 cycle #3.   INTERVAL HISTORY: Gabriela Sullivan 30 y.o. female returns to the clinic today for  a follow-up visit.  The patient was last seen in the clinic last week.  She was supposed to receive cycle #3 at this time but had some slight and progressive elevation of her AST and ALT.  The patient was seen with Dr. Jana Hakim.  Dr. Jana Hakim recommended that the delay treatment by 1 week to allow time for recovery of her LFTs.  Since her last appointment, the patient continues to feel well today overall. She reports nausea which is controlled with her anti-emetic. Denies abdominal pain, vomiting, alcohol use, diarrhea, or constipation.   She denies any fever, chills, night sweats, or weight loss.  Her appetite is "good". She denies any chest pain, cough, or shortness of breath but she describes an occasional cold sensation in her chest that she describes as the sensation you get with minty gum when you breath in. She denies associated symptoms such as lightheadedness or palpitations or respiratory complaints. She denies any peripheral neuropathy. She has been having improvement in the erythema near her breast mass.  Her genetic work up was negative for known hereditary predisposition to cancer. The patient is scheduled to follow-up with her surgeon, Dr. Brantley Stage, on 12/11/20.  The patient is here today for evaluation and repeat blood work before starting cycle #3.  COVID 19 VACCINATION STATUS: refuses vaccination     HISTORY OF CURRENT ILLNESS: From the original intake note:   Gabriela Sullivan noted a marble sized lump in her left breast in 2020.  She ignored it.  Around September 2021 this mass was "golf sized" and she brought it to her mother's attention.   She did not bring it to medical attention until several months later and she finally underwent bilateral diagnostic mammography with tomography and left breast ultrasonography at [facility] on 09/12/2020 showing: breast density category C; 5.1 cm ill-defined outer left breast mass corresponding to patient's palpable abnormality; at least 4 abnormal left axillary lymph nodes with cortical thickening; indeterminate 0.8 cm group of outer right breast calcifications; indeterminate 6 cm upper-outer left breast calcifications.   Accordingly on 09/26/2020 she proceeded to biopsy of the bilateral breast areas in question. The pathology from this procedure ([id number]) showed: 1. Left Breast, upper-outer             - invasive ductal carcinoma, grade 2.             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 Proliferation marker Ki67 at 10%. HER2 equivocal by immunohistochemistry (2+), but negative by fluorescent in situ hybridization with a signals ratio 1.61 and number per cell 2.65.   2. Right Breast, upper-outer             - low grade ductal carcinoma in situ             - Prognostic indicators significant for: estrogen receptor, >95% positive and progesterone receptor, >95% positive, both with strong staining intensity.   She underwent additional biopsies on 09/26/2020. Pathology  from the procedure ([id number]) showed: 1. Left Breast, 3 o'clock             - invasive ductal carcinoma, grade 2             - ductal carcinoma in situ             - Prognostic indicators significant for: estrogen receptor, 60% positive with moderate staining intensity and progesterone receptor, 10% positive with strong staining intensity. Proliferation marker Ki67 at 15%. HER2 equivocal by immunohistochemistry (2+), but  negative by fluorescent in situ hybridization with a signals ratio 1.66 and number per cell 2.65. 2. Left Axilla Lymph Node             - metastatic carcinoma   Cancer Staging Ductal carcinoma in situ (DCIS) of right breast Staging form: Breast, AJCC 8th Edition - Clinical: Stage 0 (cTis (DCIS), cN0, cM0) - Signed by Chauncey Cruel, MD on 10/04/2020   Malignant neoplasm of upper-outer quadrant of left breast in female, estrogen receptor positive ([place]) Staging form: Breast, AJCC 8th Edition - Clinical stage from 10/04/2020: Stage IIA (cT3, cN1(f), cM0, G2, ER+, PR+, HER2-) - Signed by Chauncey Cruel, MD on 10/04/2020 Stage prefix: Initial diagnosis Method of lymph node assessment: Core biopsy Histologic grading system: 3 grade system   The patient's subsequent history is as detailed below.   MEDICAL HISTORY: Past Medical History:  Diagnosis Date   Asthma    remote   Breast cancer ([place])    Family history of breast cancer    Family history of ovarian cancer    Family history of prostate cancer    Family history of stomach cancer    Migraines    PCOS (polycystic ovarian syndrome)     ALLERGIES:  has No Known Allergies.  MEDICATIONS:  Current Outpatient Medications  Medication Sig Dispense Refill   amitriptyline (ELAVIL) 50 MG tablet Take 50 mg by mouth at bedtime.     dexamethasone (DECADRON) 4 MG tablet Take 2 tablets (8 mg total) by mouth daily. Take daily for 3 days after chemo. Take with food. 30 tablet 1   Goserelin Acetate (ZOLADEX [place]) Inject into the skin once a week.     ibuprofen (ADVIL) 800 MG tablet Take 1 tablet (800 mg total) by mouth every 8 (eight) hours as needed. 30 tablet 0   lidocaine-prilocaine (EMLA) cream Apply to affected area once 30 g 3   magic mouthwash SOLN Take 5 mLs by mouth 4 (four) times daily as needed for mouth pain. 240 mL 0   oxyCODONE (OXY IR/ROXICODONE) 5 MG immediate release tablet Take 1 tablet (5 mg total) by mouth every 6 (six) hours  as needed for severe pain. 15 tablet 0   prochlorperazine (COMPAZINE) 10 MG tablet Take 1 tablet (10 mg total) by mouth every 6 (six) hours as needed (Nausea or vomiting). 30 tablet 1   UDENYCA 6 MG/0.6ML injection Inject into the skin.     Vitamin D, Ergocalciferol, (DRISDOL) 50000 UNITS CAPS capsule Take 50,000 Units by mouth every 7 (seven) days.     No current facility-administered medications for this visit.    SURGICAL HISTORY:  Past Surgical History:  Procedure Laterality Date   NO PAST SURGERIES     PORTACATH PLACEMENT Right 10/18/2020   Procedure: INSERTION PORT-A-CATH;  Surgeon: Erroll Luna, MD;  Location: [facility];  Service: General;  Laterality: Right;    GYNECOLOGIC HISTORY:  No LMP recorded. (Menstrual status: Irregular Periods).  Menarche: 30 years old [place] P 0 (s/p miscarriage in 2013) LMP 1/26 - 07/03/2020, irregular, goes 2-6 months between periods Contraceptive never used HRT n/a  Hysterectomy? no BSO? 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 She is single. She lives at home with her mother (who is a stay-at-home mom), their two dogs, and one cat. She is not a Designer, fashion/clothing.                          ADVANCED DIRECTIVES: not in place; at the 10/04/2020 visit the patient was given the appropriate documents to complete and notarized at her discretion  REVIEW OF SYSTEMS:   Review of Systems  Constitutional: Positive for stable fatigue. Negative for appetite change, chills, fever and unexpected weight change.  HENT:   Negative for mouth sores, nosebleeds, sore throat and trouble swallowing.   Eyes: Negative for eye problems and icterus.  Respiratory: Negative for cough, hemoptysis, shortness of breath and wheezing.   Cardiovascular: Positive for occasional cold sensation in chest. Negative for leg swelling.  Gastrointestinal: Positive for occasional nausea.  Negative for abdominal pain, constipation, diarrhea, and vomiting.  Genitourinary: Negative for bladder incontinence, difficulty urinating, dysuria, frequency and hematuria.   Musculoskeletal: Positive for back pain (stable since diagnosis). Negative for gait problem, neck pain and neck stiffness.  Skin: Negative for itching and rash.  Neurological: Negative for dizziness, extremity weakness, gait problem, headaches, light-headedness and seizures.  Hematological: Negative for adenopathy. Does not bruise/bleed easily.  Psychiatric/Behavioral: Negative for confusion, depression and sleep disturbance. The patient is not nervous/anxious.     PHYSICAL EXAMINATION:  Blood pressure (!) 142/75, pulse 86, temperature 98 F (36.7 C), temperature source Tympanic, resp. rate 19, height 5' (1.524 m), weight 197 lb 12.8 oz (89.7 kg), SpO2 99 %.  ECOG PERFORMANCE STATUS: 1  Physical Exam  Constitutional: Oriented to person, place, and time and well-developed, well-nourished, and in no distress. HENT:  Head: Normocephalic and atraumatic.  Mouth/Throat: Oropharynx is clear and moist. No oropharyngeal exudate.  Eyes: Conjunctivae are normal. Right eye exhibits no discharge. Left eye exhibits no discharge. No scleral icterus.  Neck: Normal range of motion. Neck supple.  Cardiovascular: Normal rate, regular rhythm, normal heart sounds and intact distal pulses.   Pulmonary/Chest: Effort normal and breath sounds normal. No respiratory distress. No wheezes. No rales.  Abdominal: Soft. Bowel sounds are normal. Exhibits no distension and no mass. There is no tenderness.  Musculoskeletal: Normal range of motion. Exhibits no edema.  Lymphadenopathy:    No cervical adenopathy.  Neurological: Alert and oriented to person, place, and time. Exhibits normal muscle tone. Gait normal. Coordination normal.  Skin: Skin is warm and dry. No rash noted. Not diaphoretic. No erythema. No pallor.  Psychiatric: Mood, memory and  judgment normal.  Vitals reviewed.  LABORATORY DATA: Lab Results  Component Value Date   WBC 7.8 11/29/2020   HGB 11.8 (L) 11/29/2020   HCT 34.8 (L) 11/29/2020   MCV 83.3 11/29/2020   PLT 359 11/29/2020      Chemistry      Component Value Date/Time   NA 139 11/29/2020 0747   K 3.9 11/29/2020 0747   CL 106 11/29/2020 0747   CO2 25 11/29/2020 0747   BUN 14 11/29/2020 0747   CREATININE 0.65 11/29/2020 0747   CREATININE 0.64 10/04/2020 0755      Component Value Date/Time  CALCIUM 8.6 (L) 11/29/2020 0747   ALKPHOS 78 11/29/2020 0747   AST 97 (H) 11/29/2020 0747   AST 47 (H) 10/04/2020 0755   ALT 228 (H) 11/29/2020 0747   ALT 108 (H) 10/04/2020 0755   BILITOT 0.5 11/29/2020 0747   BILITOT 0.7 10/04/2020 0755       RADIOGRAPHIC STUDIES:  No results found.   ASSESSMENT/PLAN:  ELIGIBLE FOR AVAILABLE RESEARCH PROTOCOL:   ASSESSMENT: 30 y.o. [place] 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 as follows:             (0) status post left breast biopsies x2 and left axilla lymph node biopsy 09/26/2020 for a clinical T3 N1(f), stage IIa invasive ductal carcinoma, grade 2, estrogen and progesterone receptor positive, HER2 not amplified, with an MIB-1 in the 10-15% range             (a) right breast biopsy 09/25/2020 showed ductal carcinoma in situ, grade 1, estrogen and progesterone receptor positive             (b) breast MRI 10/06/2020 shows a 11 cm area of malignancy, with a second 1.9 cm mass, and skin thickening concerning for inflammatory breast carcinoma; at least 6 abnormal lymph nodes noted             (c) chest CT scan 10/13/2020 shows clear lungs and no obvious liver involvement but multiple lytic bone lesions in addition to the findings and MRI above             (d) bone scan 10/13/2020 shows nonspecific findings (lytic lesions not expected to be "hot")             (e) total spinal MRI 10/21/2020 shows 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  No cervical vertebral metastases noted.  There is also degenerative disc disease.   (1) genetics testing   (2) neoadjuvant chemotherapy consisting of cyclophosphamide and doxorubicin in dose dense fashion x4 starting 10/24/2020, possibly followed by paclitaxel weekly x12             (a) echo 10/13/2020 showed an ejection fraction in the 60-65% range   (3) definitive surgery for optimal local control pending   (4) adjuvant radiation to the left side to follow   (5) antiestrogens to follow at the completion of local treatment             (a) goserelin started 10/11/2020   (6) zolendronate to start 11/02/2018   PLAN: Aritza is tolerating her chemotherapy well and we are seeing signs of response.  The left breast is no longer erythematous and the mass is smaller although still palpable in the lateral aspect of the left breast.  Last week, cycle #3 was delayed by 1 week due to a slight increase in her LFTs.  Her liver enzymes today are continuing to trend upward today.   I reviewed with Dr. Jana Hakim, we need to evaluate her liver closer via imaging to ensure no metastatic disease to the liver. I have placed an STAT MRI order for an MRI of the liver. I talked to the prior authorization team who stated it may take 48-72 hours for approval.   We will delay treatment by 1 week until 12/06/20.   She will follow-up with her surgeon on 12/11/2020.    For the abnormal  chest discomfort that she occasionally feels, I will arrange for a CXR to be performed today.   The patient was advised to call immediately if she has any concerning symptoms in the interval. The patient voices understanding of current disease status and treatment options and is in agreement with the current care plan. All questions were answered. The  patient knows to call the clinic with any problems, questions or concerns. We can certainly see the patient much sooner if necessary     Orders Placed This Encounter  Procedures   DG Chest 2 View    Standing Status:   Future    Number of Occurrences:   1    Standing Expiration Date:   11/29/2021    Order Specific Question:   Reason for Exam (SYMPTOM  OR DIAGNOSIS REQUIRED)    Answer:   Inflammatory breast cancer with abnormal sensation in chest    Order Specific Question:   Is patient pregnant?    Answer:   No    Order Specific Question:   Preferred imaging location?    Answer:   [facility]   MR LIVER W WO CONTRAST    Standing Status:   Future    Standing Expiration Date:   11/29/2021    Order Specific Question:   If indicated for the ordered procedure, I authorize the administration of contrast media per Radiology protocol    Answer:   Yes    Order Specific Question:   What is the patient's sedation requirement?    Answer:   No Sedation    Order Specific Question:   Does the patient have a pacemaker or implanted devices?    Answer:   No    Order Specific Question:   Preferred imaging location?    Answer:   [facility] (table limit - 550 lbs)      The total time spent in the appointment was 30-39 minutes in this encounter.   Vallie Fayette L Khaliyah Northrop, PA-C 11/29/20

## 2020-11-24 ENCOUNTER — Telehealth: Payer: Self-pay

## 2020-11-24 ENCOUNTER — Telehealth: Payer: Self-pay | Admitting: Physician Assistant

## 2020-11-24 ENCOUNTER — Ambulatory Visit: Payer: Self-pay

## 2020-11-24 NOTE — Telephone Encounter (Signed)
[facility] Patient Navigator medicaid application faxed to [place].

## 2020-11-24 NOTE — Telephone Encounter (Signed)
Sch per 06/21 los, no answer, no room for voicemail.

## 2020-11-24 NOTE — Telephone Encounter (Signed)
Scheduled per los. Called and left msg. Mailed printout  °

## 2020-11-25 DIAGNOSIS — Z1379 Encounter for other screening for genetic and chromosomal anomalies: Secondary | ICD-10-CM | POA: Insufficient documentation

## 2020-11-27 ENCOUNTER — Telehealth: Payer: Self-pay | Admitting: Genetic Counselor

## 2020-11-27 ENCOUNTER — Ambulatory Visit: Payer: Self-pay | Admitting: Genetic Counselor

## 2020-11-27 ENCOUNTER — Encounter: Payer: Self-pay | Admitting: Genetic Counselor

## 2020-11-27 ENCOUNTER — Encounter: Payer: Self-pay | Admitting: Oncology

## 2020-11-27 DIAGNOSIS — Z1379 Encounter for other screening for genetic and chromosomal anomalies: Secondary | ICD-10-CM

## 2020-11-27 NOTE — Progress Notes (Signed)
HPI:  Gabriela Sullivan was previously seen in the [place] clinic due to a personal and family history of cancer and concerns regarding a hereditary predisposition to cancer. Please refer to our prior cancer genetics clinic note for more information regarding our discussion, assessment and recommendations, at the time. Gabriela Sullivan recent genetic test results were disclosed to her, as were recommendations warranted by these results. These results and recommendations are discussed in more detail below.  CANCER HISTORY:  Oncology History  Ductal carcinoma in situ (DCIS) of right breast  09/29/2020 Initial Diagnosis   Ductal carcinoma in situ (DCIS) of right breast    10/04/2020 Cancer Staging   Staging form: Breast, AJCC 8th Edition - Clinical: Stage 0 (cTis (DCIS), cN0, cM0) - Signed by Chauncey Cruel, MD on 10/04/2020    11/25/2020 Genetic Testing   Negative genetic testing:  No pathogenic variants detected on the [facility] CancerNext-Expanded + RNAinsight panel. The report date is 11/25/2020.   The CancerNext-Expanded + RNAinsight gene panel offered by [employer] and includes sequencing and rearrangement analysis for the following 77 genes: AIP, ALK, APC, ATM, AXIN2, BAP1, BARD1, BLM, BMPR1A, BRCA1, BRCA2, BRIP1, CDC73, CDH1, CDK4, CDKN1B, CDKN2A, CHEK2, CTNNA1, DICER1, FANCC, FH, FLCN, GALNT12, KIF1B, LZTR1, MAX, MEN1, MET, MLH1, MSH2, MSH3, MSH6, MUTYH, NBN, NF1, NF2, NTHL1, PALB2, PHOX2B, PMS2, POT1, PRKAR1A, PTCH1, PTEN, RAD51C, RAD51D, RB1, RECQL, RET, SDHA, SDHAF2, SDHB, SDHC, SDHD, SMAD4, SMARCA4, SMARCB1, SMARCE1, STK11, SUFU, TMEM127, TP53, TSC1, TSC2, VHL and XRCC2 (sequencing and deletion/duplication); EGFR, EGLN1, HOXB13, KIT, MITF, PDGFRA, POLD1 and POLE (sequencing only); EPCAM and GREM1 (deletion/duplication only). RNA data is routinely analyzed for use in variant interpretation for all genes.   Malignant neoplasm of upper-outer quadrant of left breast in female, estrogen  receptor positive ([place])  09/29/2020 Initial Diagnosis   Malignant neoplasm of upper-outer quadrant of left breast in female, estrogen receptor positive ([place])    10/04/2020 Cancer Staging   Staging form: Breast, AJCC 8th Edition - Clinical stage from 10/04/2020: Stage IIA (cT3, cN1(f), cM0, G2, ER+, PR+, HER2-) - Signed by Chauncey Cruel, MD on 10/04/2020  Stage prefix: Initial diagnosis  Method of lymph node assessment: Core biopsy  Histologic grading system: 3 grade system    10/25/2020 -  Chemotherapy    Patient is on Treatment Plan: BREAST ADJUVANT DOSE DENSE AC Q14D / PACLITAXEL Q7D       11/25/2020 Genetic Testing   Negative genetic testing:  No pathogenic variants detected on the [facility] CancerNext-Expanded + RNAinsight panel. The report date is 11/25/2020.   The CancerNext-Expanded + RNAinsight gene panel offered by [employer] and includes sequencing and rearrangement analysis for the following 77 genes: AIP, ALK, APC, ATM, AXIN2, BAP1, BARD1, BLM, BMPR1A, BRCA1, BRCA2, BRIP1, CDC73, CDH1, CDK4, CDKN1B, CDKN2A, CHEK2, CTNNA1, DICER1, FANCC, FH, FLCN, GALNT12, KIF1B, LZTR1, MAX, MEN1, MET, MLH1, MSH2, MSH3, MSH6, MUTYH, NBN, NF1, NF2, NTHL1, PALB2, PHOX2B, PMS2, POT1, PRKAR1A, PTCH1, PTEN, RAD51C, RAD51D, RB1, RECQL, RET, SDHA, SDHAF2, SDHB, SDHC, SDHD, SMAD4, SMARCA4, SMARCB1, SMARCE1, STK11, SUFU, TMEM127, TP53, TSC1, TSC2, VHL and XRCC2 (sequencing and deletion/duplication); EGFR, EGLN1, HOXB13, KIT, MITF, PDGFRA, POLD1 and POLE (sequencing only); EPCAM and GREM1 (deletion/duplication only). RNA data is routinely analyzed for use in variant interpretation for all genes.     FAMILY HISTORY:  We obtained a detailed, 4-generation family history.  Significant diagnoses are listed below: Family History  Problem Relation Age of Onset   Ovarian cancer Maternal Grandmother  unknown age of diagnosis   Prostate cancer Maternal Grandfather        metastatic   Breast cancer Cousin         unknown age of diagnosis, maternal first cousin   Stomach cancer Cousin        dx 20s/30s, maternal first cousin   Gabriela Sullivan does not have any children. She has one maternal half-brother (age 31) and one maternal half-sister (age 19), neither of whom have had cancer.   Gabriela Sullivan mother is alive at age 20 without cancer. There is one maternal aunt and one maternal uncle currently living. Some maternal aunts/uncles have passed away, although Gabriela Sullivan does not know how many. There is no known cancer among maternal aunts/uncles. Two maternal first cousins had cancer - one had breast cancer (unknown age of diagnosis), and the other died from stomach cancer in her 39s or 32s. Gabriela Sullivan maternal grandmother died older than 5 with ovarian cancer (unknown age of diagnosis). Her maternal grandfather died older than 80 with metastatic prostate cancer.   Gabriela Sullivan does not have any information about her father or his side of the family.   Gabriela Sullivan is unaware of previous family history of genetic testing for hereditary cancer risks. Patient's maternal ancestors are of [place] descent, and paternal ancestors are of unknown descent. There is no reported Ashkenazi Jewish ancestry. There is no known consanguinity.  GENETIC TEST RESULTS: Genetic testing reported out on 11/25/2020 through the [facility] + RNAinsight panel. No pathogenic variants were detected.   The CancerNext-Expanded + RNAinsight gene panel offered by [employer] and includes sequencing and rearrangement analysis for the following 77 genes: AIP, ALK, APC, ATM, AXIN2, BAP1, BARD1, BLM, BMPR1A, BRCA1, BRCA2, BRIP1, CDC73, CDH1, CDK4, CDKN1B, CDKN2A, CHEK2, CTNNA1, DICER1, FANCC, FH, FLCN, GALNT12, KIF1B, LZTR1, MAX, MEN1, MET, MLH1, MSH2, MSH3, MSH6, MUTYH, NBN, NF1, NF2, NTHL1, PALB2, PHOX2B, PMS2, POT1, PRKAR1A, PTCH1, PTEN, RAD51C, RAD51D, RB1, RECQL, RET, SDHA, SDHAF2, SDHB, SDHC, SDHD, SMAD4, SMARCA4, SMARCB1, SMARCE1,  STK11, SUFU, TMEM127, TP53, TSC1, TSC2, VHL and XRCC2 (sequencing and deletion/duplication); EGFR, EGLN1, HOXB13, KIT, MITF, PDGFRA, POLD1 and POLE (sequencing only); EPCAM and GREM1 (deletion/duplication only). RNA data is routinely analyzed for use in variant interpretation for all genes. The test report will be scanned into EPIC and located under the Molecular Pathology section of the Results Review tab.  A portion of the result report is included below for reference.     We discussed with Gabriela Sullivan that because current genetic testing is not perfect, it is possible there may be a gene mutation in one of these genes that current testing cannot detect, but that chance is small.  We also discussed that there could be another gene that has not yet been discovered, or that we have not yet tested, that is responsible for the cancer diagnoses in the family. It is also possible there is a hereditary cause for the cancer in the family that Gabriela Sullivan did not inherit and therefore was not identified in her testing.  Therefore, it is important to remain in touch with cancer genetics in the future so that we can continue to offer Gabriela Sullivan the most up to date genetic testing.   ADDITIONAL GENETIC TESTING: We discussed with Gabriela Sullivan that her genetic testing was fairly extensive.  If there are genes identified to increase cancer risk that can be analyzed in the future, we would be happy to discuss and coordinate this testing at that  time.    CANCER SCREENING RECOMMENDATIONS: Gabriela Sullivan test result is considered negative (normal).  This means that we have not identified a hereditary cause for her personal and family history of cancer at this time. While reassuring, this does not definitively rule out a hereditary predisposition to cancer. It is still possible that there could be genetic mutations that are undetectable by current technology. There could be genetic mutations in genes that have not been tested or identified  to increase cancer risk.  Therefore, it is recommended she continue to follow the cancer management and screening guidelines provided by her oncology and primary healthcare provider.   An individual's cancer risk and medical management are not determined by genetic test results alone. Overall cancer risk assessment incorporates additional factors, including personal medical history, family history, and any available genetic information that may result in a personalized plan for cancer prevention and surveillance.  RECOMMENDATIONS FOR FAMILY MEMBERS:  Individuals in this family might be at some increased risk of developing cancer, over the general population risk, simply due to the family history of cancer.  We recommended women in this family have a yearly mammogram beginning at age 66, or 48 years younger than the earliest onset of cancer, an annual clinical breast exam, and perform monthly breast self-exams. Women in this family should also have a gynecological exam as recommended by their primary provider. All family members should be referred for colonoscopy starting at age 34.  It is also possible there is a hereditary cause for the cancer in Gabriela Sullivan family that she did not inherit and therefore was not identified in her.  Based on Gabriela Sullivan family history, we recommended her mother and maternal aunt and uncle have genetic counseling and testing. Gabriela Sullivan will let us know if we can be of any assistance in coordinating genetic counseling and/or testing for these family members.   FOLLOW-UP: Lastly, we discussed with Gabriela Sullivan that cancer genetics is a rapidly advancing field and it is possible that new genetic tests will be appropriate for her and/or her family members in the future. We encouraged her to remain in contact with cancer genetics on an annual basis so we can update her personal and family histories and let her know of advances in cancer genetics that may benefit this family.   Our  contact number was provided. Ms. Minium questions were answered to her satisfaction, and she knows she is welcome to call us at anytime with additional questions or concerns.   Clint Guy, MS, [facility] Genetic Counselor [email or web address] Phone: [phone]

## 2020-11-27 NOTE — Telephone Encounter (Signed)
Revealed negative genetic testing. Discussed that we do not know why she has breast cancer or why there is cancer in the family. There could be a genetic mutation in the family that Gabriela Sullivan did not inherit. There could also be a mutation in a different gene that we are not testing, or our current technology may not be able to detect certain mutations. It will therefore be important for her to stay in contact with genetics to keep up with whether additional testing may be appropriate in the future.

## 2020-11-28 ENCOUNTER — Encounter: Payer: Self-pay | Admitting: Oncology

## 2020-11-28 MED FILL — Dexamethasone Sodium Phosphate Inj 100 MG/10ML: INTRAMUSCULAR | Qty: 1 | Status: AC

## 2020-11-28 MED FILL — Fosaprepitant Dimeglumine For IV Infusion 150 MG (Base Eq): INTRAVENOUS | Qty: 5 | Status: AC

## 2020-11-29 ENCOUNTER — Other Ambulatory Visit: Payer: Self-pay

## 2020-11-29 ENCOUNTER — Encounter: Payer: Self-pay | Admitting: Physician Assistant

## 2020-11-29 ENCOUNTER — Inpatient Hospital Stay: Payer: Medicaid Other

## 2020-11-29 ENCOUNTER — Other Ambulatory Visit: Payer: Self-pay | Admitting: Oncology

## 2020-11-29 ENCOUNTER — Inpatient Hospital Stay (HOSPITAL_BASED_OUTPATIENT_CLINIC_OR_DEPARTMENT_OTHER): Payer: Medicaid Other | Admitting: Physician Assistant

## 2020-11-29 ENCOUNTER — Ambulatory Visit (HOSPITAL_COMMUNITY)
Admission: RE | Admit: 2020-11-29 | Discharge: 2020-11-29 | Disposition: A | Discharge status: Home / Self Care | Payer: Medicaid Other | Source: Ambulatory Visit | Attending: Physician Assistant | Admitting: Physician Assistant

## 2020-11-29 ENCOUNTER — Telehealth: Payer: Self-pay | Admitting: Physician Assistant

## 2020-11-29 VITALS — BP 142/75 | HR 86 | Temp 98.0°F | Resp 19 | Ht 60.0 in | Wt 197.8 lb

## 2020-11-29 DIAGNOSIS — Z170 Estrogen receptor positive status [ER+]: Secondary | ICD-10-CM

## 2020-11-29 DIAGNOSIS — R7989 Other specified abnormal findings of blood chemistry: Secondary | ICD-10-CM | POA: Diagnosis not present

## 2020-11-29 DIAGNOSIS — Z95828 Presence of other vascular implants and grafts: Secondary | ICD-10-CM

## 2020-11-29 DIAGNOSIS — R0789 Other chest pain: Secondary | ICD-10-CM

## 2020-11-29 DIAGNOSIS — C50412 Malignant neoplasm of upper-outer quadrant of left female breast: Secondary | ICD-10-CM

## 2020-11-29 DIAGNOSIS — Z5111 Encounter for antineoplastic chemotherapy: Secondary | ICD-10-CM | POA: Diagnosis not present

## 2020-11-29 DIAGNOSIS — D0511 Intraductal carcinoma in situ of right breast: Secondary | ICD-10-CM

## 2020-11-29 LAB — CBC WITH DIFFERENTIAL/PLATELET
Abs Immature Granulocytes: 0.17 10*3/uL — ABNORMAL HIGH (ref 0.00–0.07)
Basophils Absolute: 0.1 10*3/uL (ref 0.0–0.1)
Basophils Relative: 1 %
Eosinophils Absolute: 0.1 10*3/uL (ref 0.0–0.5)
Eosinophils Relative: 1 %
HCT: 34.8 % — ABNORMAL LOW (ref 36.0–46.0)
Hemoglobin: 11.8 g/dL — ABNORMAL LOW (ref 12.0–15.0)
Immature Granulocytes: 2 %
Lymphocytes Relative: 20 %
Lymphs Abs: 1.5 10*3/uL (ref 0.7–4.0)
MCH: 28.2 pg (ref 26.0–34.0)
MCHC: 33.9 g/dL (ref 30.0–36.0)
MCV: 83.3 fL (ref 80.0–100.0)
Monocytes Absolute: 0.8 10*3/uL (ref 0.1–1.0)
Monocytes Relative: 10 %
Neutro Abs: 5.2 10*3/uL (ref 1.7–7.7)
Neutrophils Relative %: 66 %
Platelets: 359 10*3/uL (ref 150–400)
RBC: 4.18 MIL/uL (ref 3.87–5.11)
RDW: 14.5 % (ref 11.5–15.5)
WBC: 7.8 10*3/uL (ref 4.0–10.5)
nRBC: 0 % (ref 0.0–0.2)

## 2020-11-29 LAB — COMPREHENSIVE METABOLIC PANEL
ALT: 228 U/L — ABNORMAL HIGH (ref 0–44)
AST: 97 U/L — ABNORMAL HIGH (ref 15–41)
Albumin: 3.7 g/dL (ref 3.5–5.0)
Alkaline Phosphatase: 78 U/L (ref 38–126)
Anion gap: 8 (ref 5–15)
BUN: 14 mg/dL (ref 6–20)
CO2: 25 mmol/L (ref 22–32)
Calcium: 8.6 mg/dL — ABNORMAL LOW (ref 8.9–10.3)
Chloride: 106 mmol/L (ref 98–111)
Creatinine, Ser: 0.65 mg/dL (ref 0.44–1.00)
GFR, Estimated: >60 mL/min (ref >60)
Glucose, Bld: 266 mg/dL — ABNORMAL HIGH (ref 70–99)
Potassium: 3.9 mmol/L (ref 3.5–5.1)
Sodium: 139 mmol/L (ref 135–145)
Total Bilirubin: 0.5 mg/dL (ref 0.3–1.2)
Total Protein: 7 g/dL (ref 6.5–8.1)

## 2020-11-29 IMAGING — DX DG CHEST 2V
2 series · 2 of 2 positions shown · non-contrast
Comparison: [DATE]

CLINICAL DATA: Inflammatory breast cancer new diagnosis

EXAM:
CHEST - 2 VIEW

[chest pa]
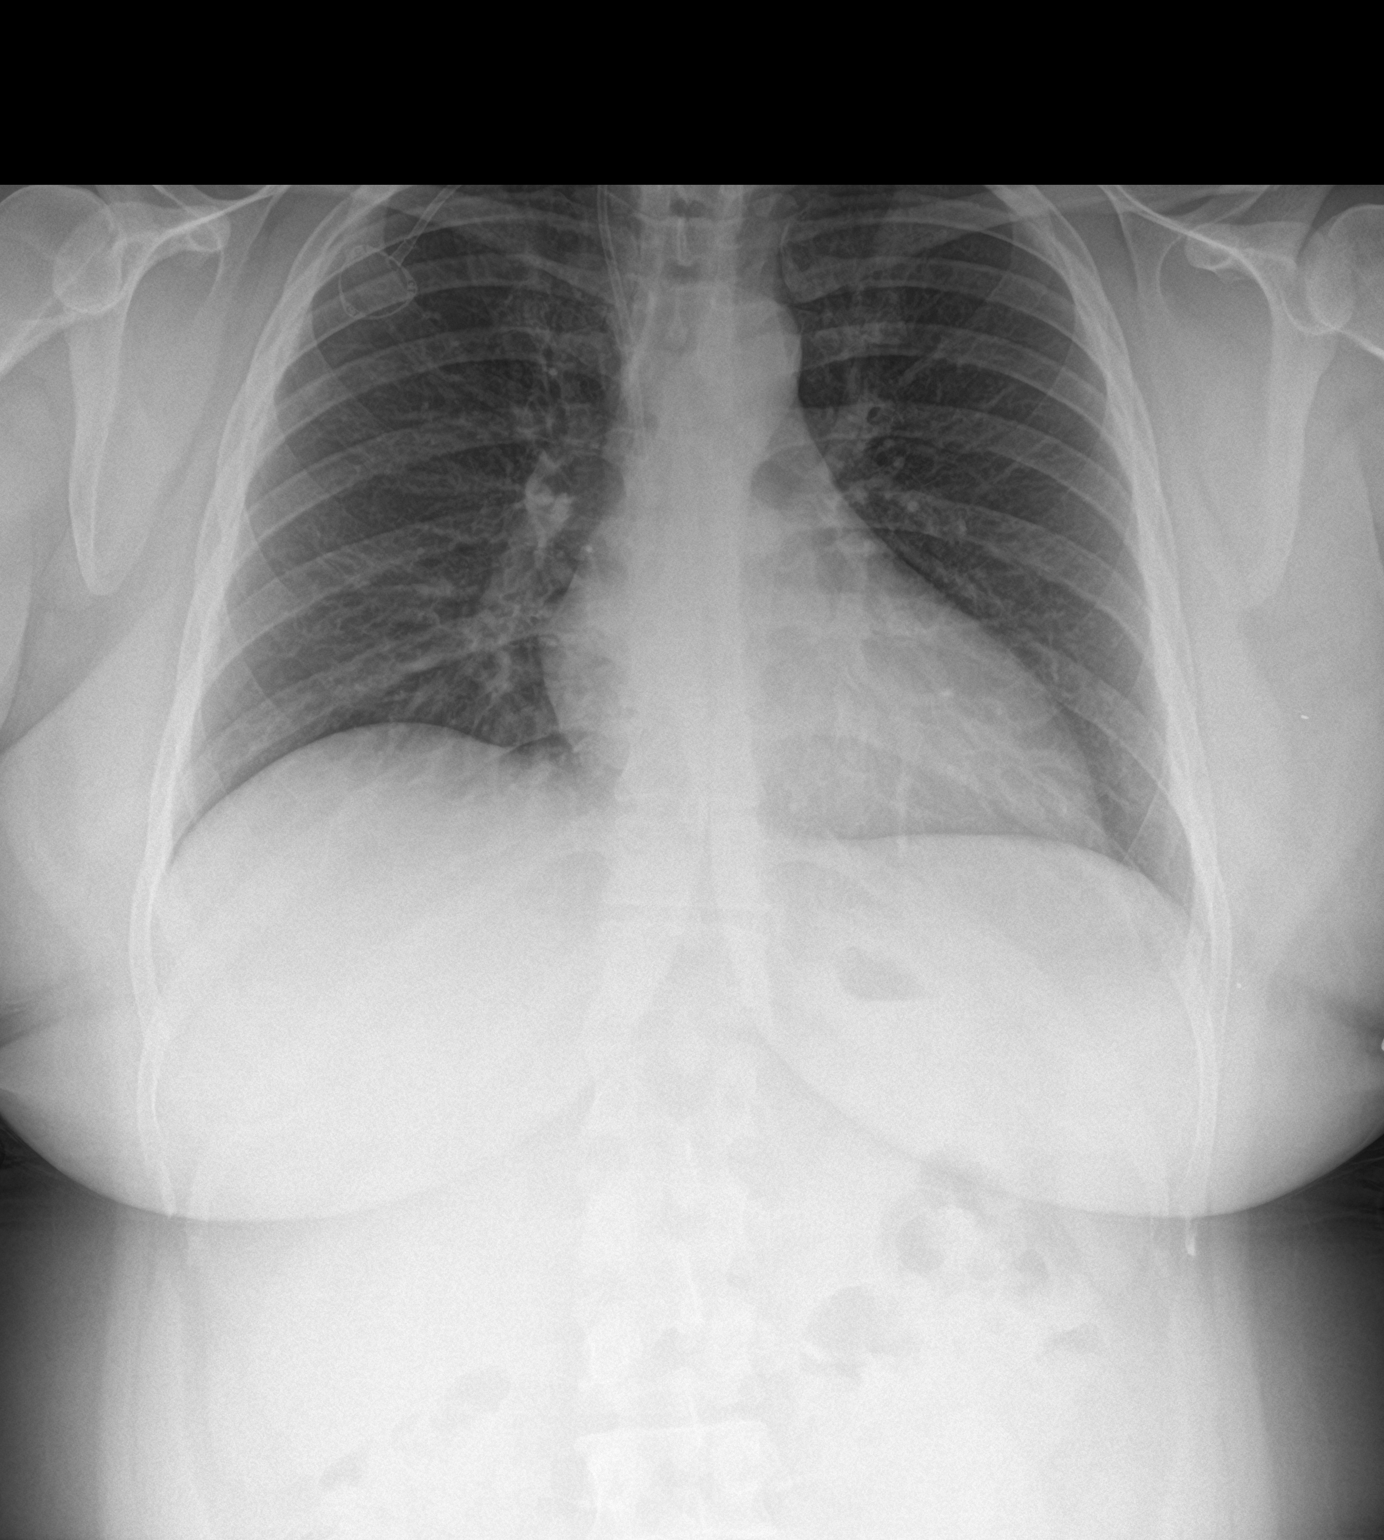

[chest lat]
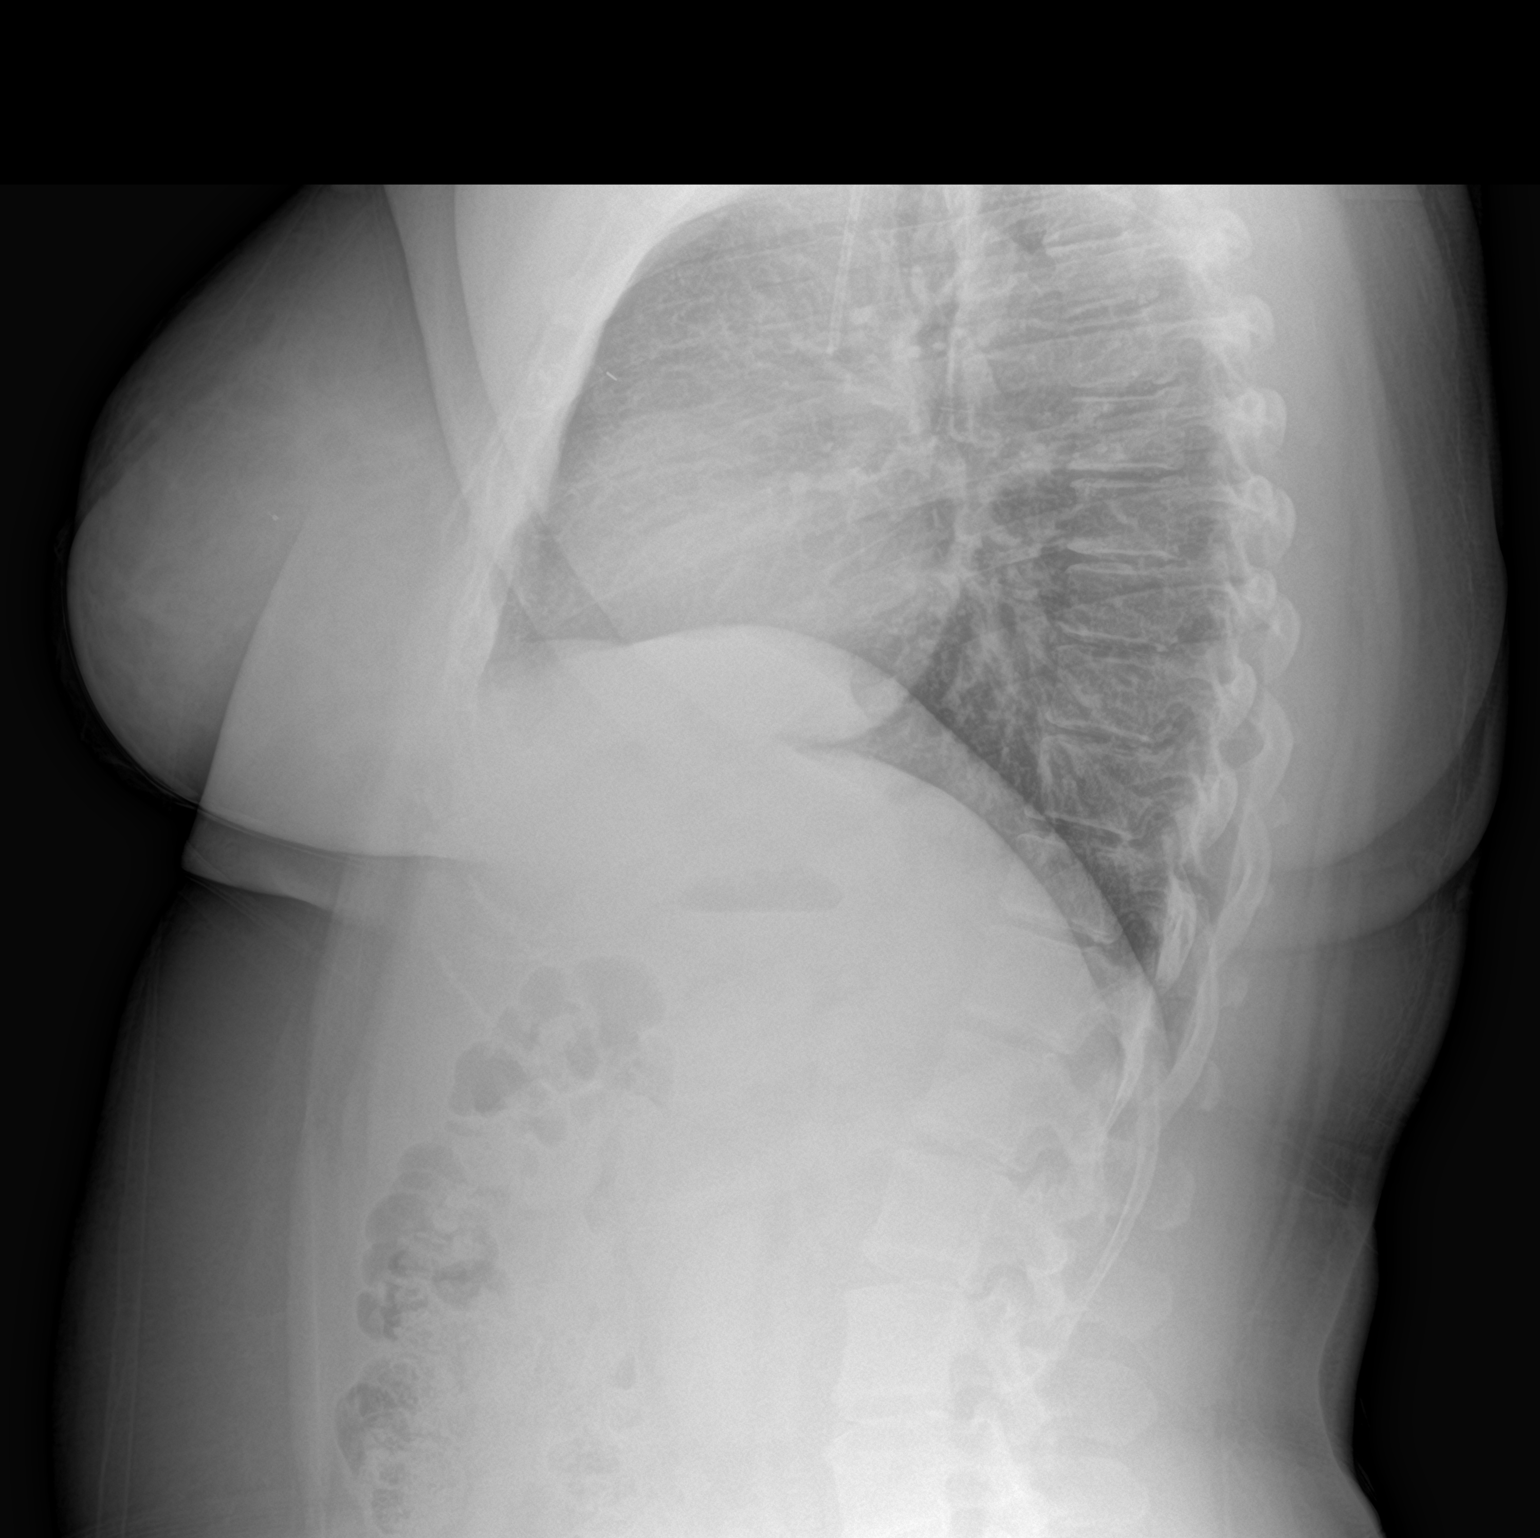

[2 of 2 positions shown; findings below may reference images not displayed]

FINDINGS: Heart size upper normal. Vascularity normal. Lungs are clear without
infiltrate effusion or mass. Port-A-Cath tip in the SVC unchanged.
No interval change.
IMPRESSION: No active cardiopulmonary disease.

## 2020-11-29 MED ORDER — SODIUM CHLORIDE 0.9% FLUSH
10.0000 mL | INTRAVENOUS | Status: DC | PRN
  Administered 2020-11-29: 10 mL via INTRAVENOUS
  Filled 2020-11-29: qty 10

## 2020-11-29 NOTE — Telephone Encounter (Signed)
Scheduled appts per 6/29 sch msg. Pt aware.  

## 2020-11-29 NOTE — Telephone Encounter (Signed)
I called the patient to let her know that her chest x ray did not show any acute cardiopulmonary disease. Her heart size was normal, the vascularity was normal. No infiltrates. Regarding the MRI of the liver, documentation was submitted to her insurance. This was submitted as a STAT order. I was told it may take 48-72 hours in order to get approval. Once this has been approved, we will make sure the imaging department reaches out to her to schedule at the earliest availability.

## 2020-11-29 NOTE — Progress Notes (Signed)
Pt's tx held today per Cassandra Heilingoetter, PA-C d/t elevated LFTs.  Pt will resume tx on next week.

## 2020-11-29 NOTE — Patient Instructions (Signed)

## 2020-11-30 ENCOUNTER — Ambulatory Visit (HOSPITAL_COMMUNITY)
Admission: RE | Admit: 2020-11-30 | Discharge: 2020-11-30 | Disposition: A | Discharge status: Home / Self Care | Payer: Medicaid Other | Source: Ambulatory Visit | Attending: Physician Assistant | Admitting: Physician Assistant

## 2020-11-30 DIAGNOSIS — C50412 Malignant neoplasm of upper-outer quadrant of left female breast: Secondary | ICD-10-CM | POA: Diagnosis not present

## 2020-11-30 DIAGNOSIS — R7989 Other specified abnormal findings of blood chemistry: Secondary | ICD-10-CM | POA: Insufficient documentation

## 2020-11-30 DIAGNOSIS — Z170 Estrogen receptor positive status [ER+]: Secondary | ICD-10-CM | POA: Insufficient documentation

## 2020-11-30 IMAGING — MR MR ABDOMEN WO/W CM
19 of 20 series · 47 of 48 positions shown · IV contrast (gadavist)
Comparison: None.

CLINICAL DATA: Screening for liver mets. Inflammatory breast cancer
with increasing LFTs.

EXAM:
MRI ABDOMEN WITHOUT AND WITH CONTRAST
TECHNIQUE: Multiplanar multisequence MR imaging of the abdomen was performed
both before and after the administration of intravenous contrast.
CONTRAST:  10mL GADAVIST GADOBUTROL 1 MMOL/ML IV SOLN

[Series 3: T2 · coronal · 6.0mm · 1.64mm/px · 2 of 30 slices shown (1 of 2)]
[im 1/30]
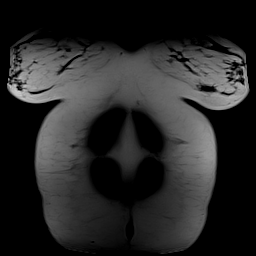
[im 30/30]
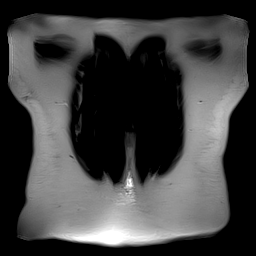

[Series 4: DWI · axial · 6.0mm · 1.57mm/px · z∈[-145,+92]mm · 3 of 68 slices shown (1 of 2)]
[im 1/68]
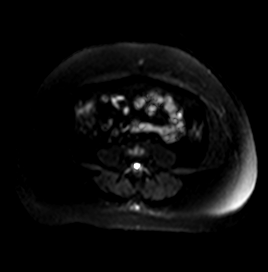
[im 34/68]
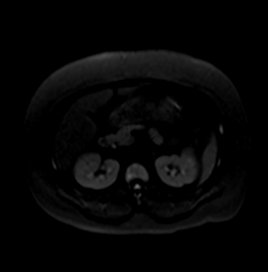
[im 68/68]
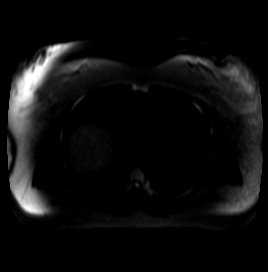

[Series 5: DWI · axial · 6.0mm · 1.57mm/px · 1 of 34 slices shown (2 of 2)]
[im 1/34]
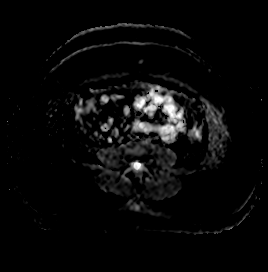

[Series 6: T2 fat-sat · 1 of 8 slices shown (1 of 2)]
[im 1/8]
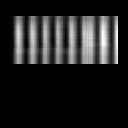

[Series 7: T2 fat-sat · axial · 6.0mm · 1.25mm/px · 1 of 36 slices shown (2 of 2)]
[im 1/36]
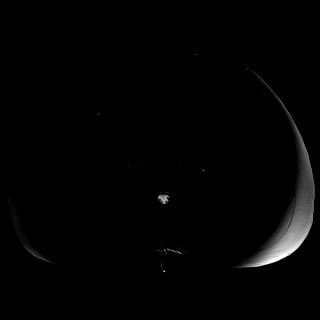

[Series 8: T1 · axial · 3.0mm · 1.25mm/px · z∈[-142,+95]mm · 3 of 80 slices shown (1 of 2)]
[im 1/80]
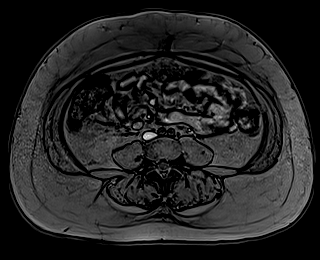
[im 40/80]
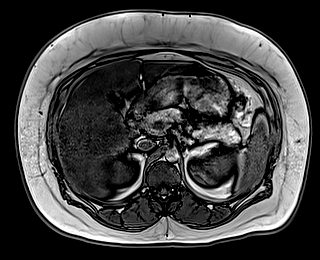
[im 80/80]
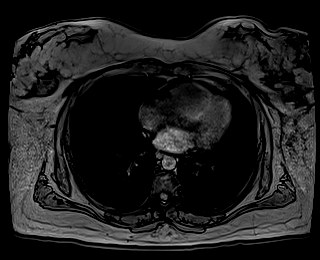

[Series 9: T1 · axial · 3.0mm · 1.25mm/px · z∈[-142,+95]mm · 3 of 80 slices shown (2 of 2)]
[im 1/80]
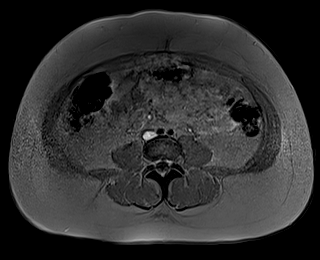
[im 40/80]
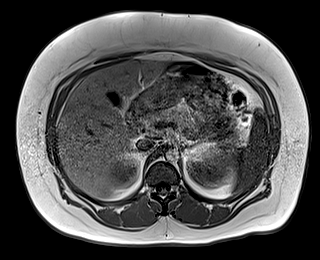
[im 80/80]
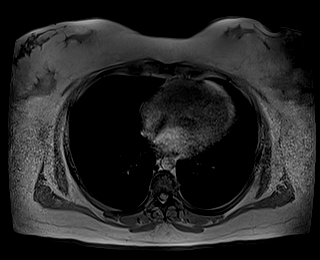

[Series 10: bSSFP · axial · 4.0mm · 0.84mm/px · z∈[-137,+99]mm · 2 of 60 slices shown]
[im 1/60]
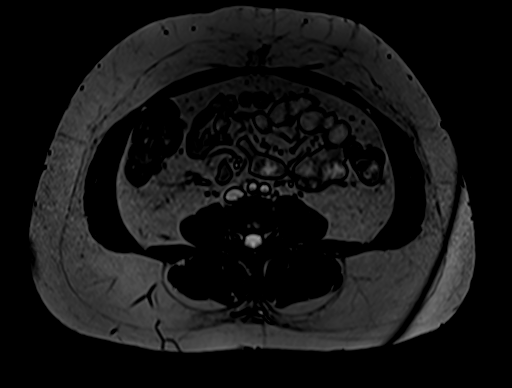
[im 60/60]
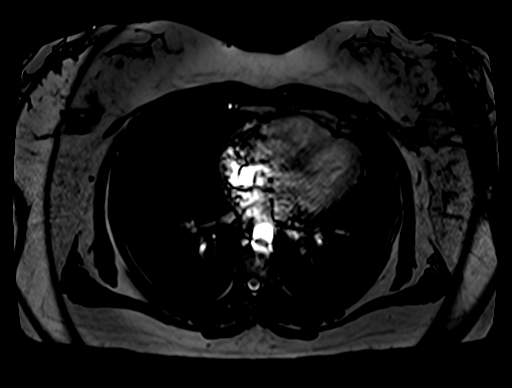

[Series 12: T1 dynamic · axial · 3.0mm · 1.25mm/px · z∈[-138,+99]mm · 3 of 80 slices shown (1 of 10)]
[im 1/80]
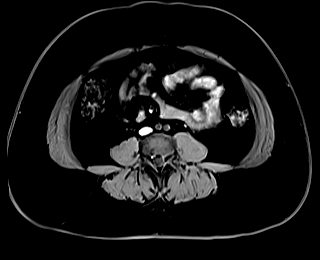
[im 40/80]
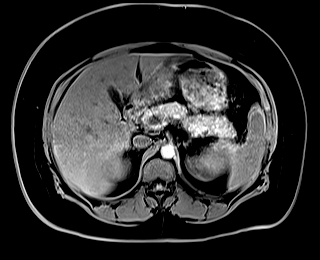
[im 80/80]
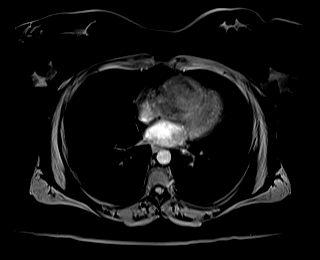

[Series 16: T1 dynamic · axial · 3.0mm · 1.25mm/px · z∈[-138,+99]mm · 3 of 80 slices shown (2 of 10)]
[im 1/80]
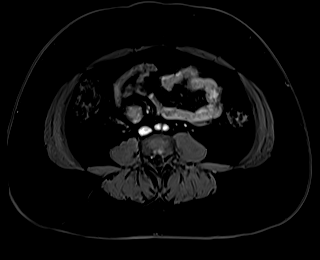
[im 40/80]
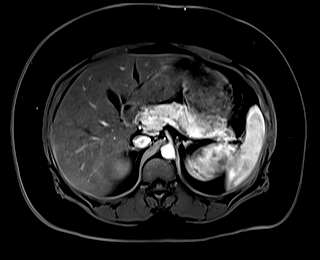
[im 80/80]
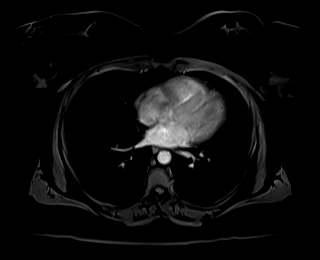

[Series 17: T1 dynamic · axial · 3.0mm · 1.25mm/px · z∈[-138,+99]mm · 3 of 80 slices shown (3 of 10)]
[im 1/80]
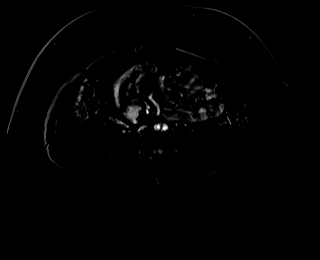
[im 40/80]
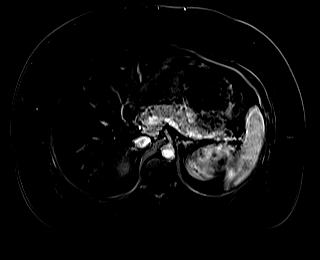
[im 80/80]
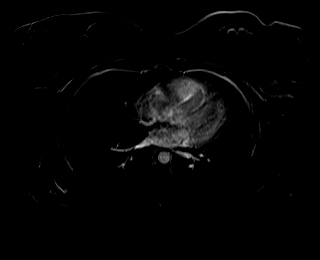

[Series 20: T1 dynamic · axial · 3.0mm · 1.25mm/px · z∈[-138,+99]mm · 3 of 80 slices shown (4 of 10)]
[im 1/80]
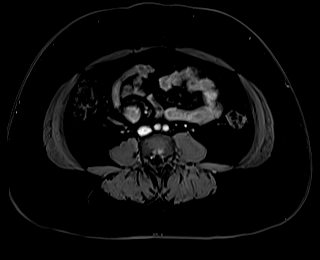
[im 40/80]
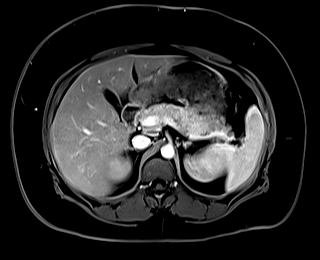
[im 80/80]
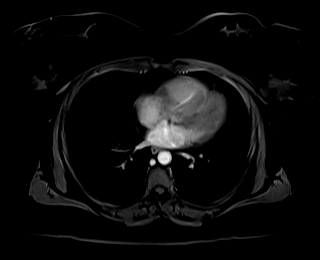

[Series 21: T1 dynamic · axial · 3.0mm · 1.25mm/px · z∈[-138,+99]mm · 3 of 80 slices shown (5 of 10)]
[im 1/80]
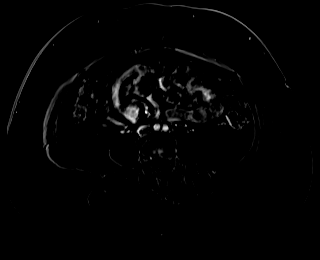
[im 40/80]
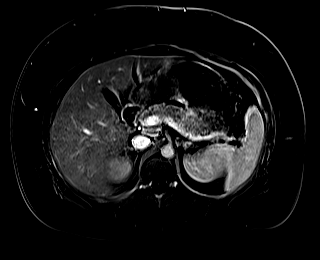
[im 80/80]
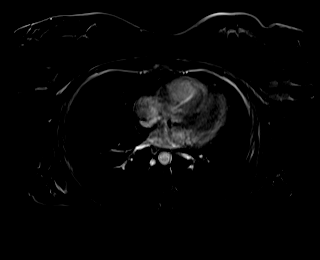

[Series 24: T1 dynamic · axial · 3.0mm · 1.25mm/px · z∈[-138,+99]mm · 3 of 80 slices shown (6 of 10)]
[im 1/80]
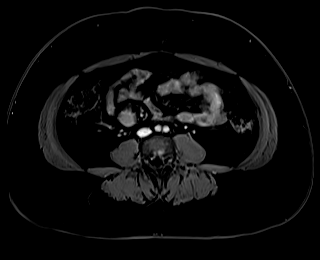
[im 40/80]
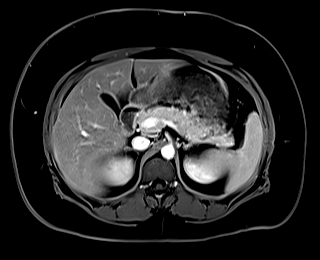
[im 80/80]
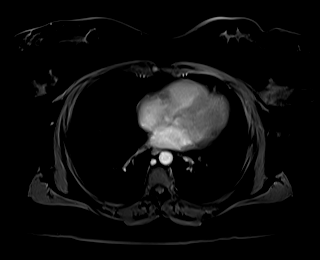

[Series 25: T1 dynamic · axial · 3.0mm · 1.25mm/px · z∈[-138,+99]mm · 3 of 80 slices shown (7 of 10)]
[im 1/80]
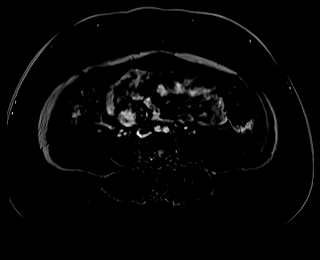
[im 40/80]
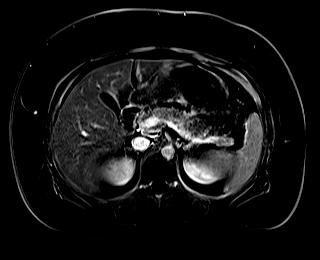
[im 80/80]
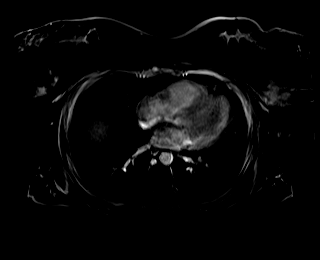

[Series 27: T1 dynamic · coronal · 3.0mm · 1.50mm/px · 3 of 72 slices shown (8 of 10)]
[im 1/72]
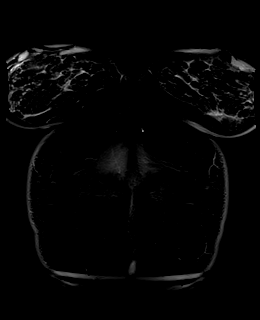
[im 36/72]
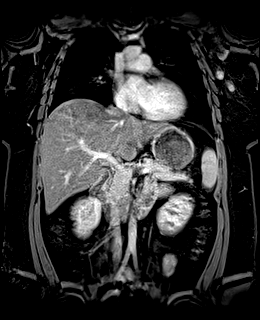
[im 72/72]
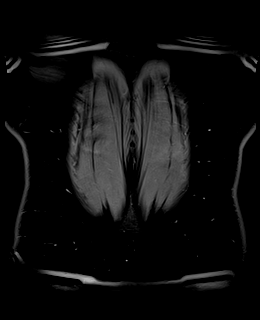

[Series 28: T2 · axial · 6.0mm · 1.56mm/px · 1 of 33 slices shown (2 of 2)]
[im 1/33]
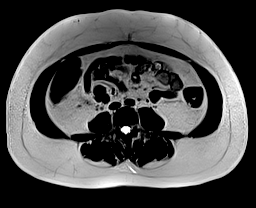

[Series 31: T1 dynamic · axial · 3.0mm · 1.25mm/px · z∈[-138,+99]mm · 3 of 80 slices shown (9 of 10)]
[im 1/80]
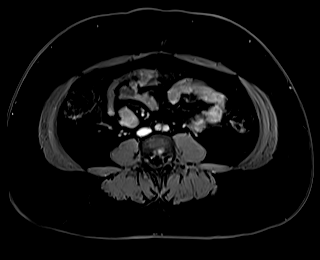
[im 40/80]
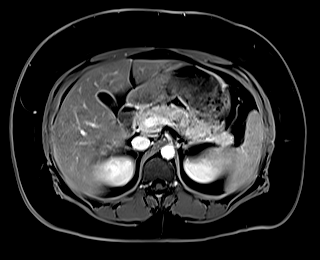
[im 80/80]
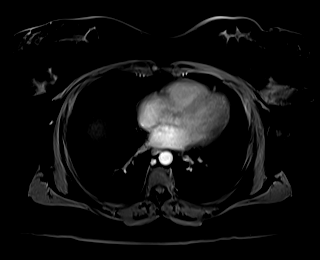

[Series 32: T1 dynamic · axial · 3.0mm · 1.25mm/px · z∈[-138,+99]mm · 3 of 80 slices shown (10 of 10)]
[im 1/80]
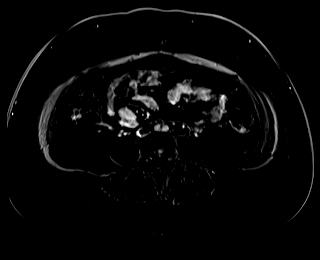
[im 40/80]
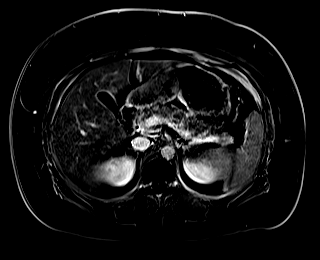
[im 80/80]
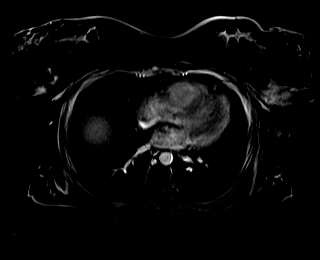

[47 of 48 positions shown; findings below may reference images not displayed]

FINDINGS: Lower chest: Unremarkable.

Hepatobiliary: Diffuse loss of signal intensity in the liver
parenchyma on out of phase T1 imaging is compatible with fatty
deposition. No suspicious enhancing liver lesion to suggest
metastatic disease. No intrahepatic or extrahepatic biliary
dilation. Gallbladder is nondistended.

Pancreas: No focal mass lesion. No dilatation of the main duct. No
intraparenchymal cyst. No peripancreatic edema.

Spleen:  No splenomegaly. No focal mass lesion.

Adrenals/Urinary Tract: No adrenal nodule or mass. Tiny T2
hyperintensity in the upper pole right kidney is too small to
characterize but likely a cyst. Left kidney unremarkable.

Stomach/Bowel: Stomach is unremarkable. No gastric wall thickening.
No evidence of outlet obstruction. Duodenum is normally positioned
as is the ligament of Treitz. No small bowel wall thickening. No
small bowel dilatation.

Vascular/Lymphatic: No abdominal aortic aneurysm. There is no
gastrohepatic or hepatoduodenal ligament lymphadenopathy. No
retroperitoneal or mesenteric lymphadenopathy.

Other:  No intraperitoneal free fluid.

Musculoskeletal: Numerous tiny enhancing lesions in the spine are
compatible with metastatic disease, better characterized on recent
spine MRI of [DATE].
IMPRESSION: 1. No evidence for metastatic disease to the liver. No intra or
extrahepatic biliary duct dilatation.
2. Hepatic steatosis.
3. Numerous tiny enhancing lesions in the spine are compatible with
metastatic disease, better characterized on recent spine MRI of
[DATE].

## 2020-11-30 MED ORDER — GADOBUTROL 1 MMOL/ML IV SOLN
10.0000 mL | Freq: Once | INTRAVENOUS | Status: AC | PRN
  Administered 2020-11-30: 10 mL via INTRAVENOUS

## 2020-11-30 NOTE — Progress Notes (Signed)
..  The following Assist/Replace Program for Udenyca from [facility] has been terminated due to patient has full Medicaid starting 11/01/2020.  Last DOS: 11/10/2020.

## 2020-11-30 NOTE — Progress Notes (Signed)
..  The following Assist/Replace Program for Zoladex from [employer] has been terminated due to patient has full coverage Medicaid starting 11/01/20.  Last DOS: 11/08/2020.

## 2020-12-01 ENCOUNTER — Telehealth: Payer: Self-pay | Admitting: Physician Assistant

## 2020-12-01 ENCOUNTER — Other Ambulatory Visit: Payer: Self-pay

## 2020-12-01 ENCOUNTER — Inpatient Hospital Stay: Payer: Medicaid Other | Attending: Oncology

## 2020-12-01 VITALS — BP 135/89 | HR 82 | Temp 98.7°F | Resp 18

## 2020-12-01 DIAGNOSIS — C773 Secondary and unspecified malignant neoplasm of axilla and upper limb lymph nodes: Secondary | ICD-10-CM | POA: Insufficient documentation

## 2020-12-01 DIAGNOSIS — C50812 Malignant neoplasm of overlapping sites of left female breast: Secondary | ICD-10-CM | POA: Diagnosis not present

## 2020-12-01 DIAGNOSIS — Z5111 Encounter for antineoplastic chemotherapy: Secondary | ICD-10-CM | POA: Insufficient documentation

## 2020-12-01 DIAGNOSIS — C7951 Secondary malignant neoplasm of bone: Secondary | ICD-10-CM | POA: Diagnosis not present

## 2020-12-01 DIAGNOSIS — C50412 Malignant neoplasm of upper-outer quadrant of left female breast: Secondary | ICD-10-CM | POA: Diagnosis not present

## 2020-12-01 DIAGNOSIS — Z170 Estrogen receptor positive status [ER+]: Secondary | ICD-10-CM | POA: Diagnosis not present

## 2020-12-01 MED ORDER — GOSERELIN ACETATE 3.6 MG ~~LOC~~ IMPL
DRUG_IMPLANT | SUBCUTANEOUS | Status: AC
  Filled 2020-12-01: qty 3.6

## 2020-12-01 MED ORDER — GOSERELIN ACETATE 3.6 MG ~~LOC~~ IMPL
3.6000 mg | DRUG_IMPLANT | Freq: Once | SUBCUTANEOUS | Status: AC
  Administered 2020-12-01: 3.6 mg via SUBCUTANEOUS

## 2020-12-01 NOTE — Progress Notes (Signed)
[facility] OFFICE PROGRESS NOTE  [name], MD [place] [address]  DIAGNOSIS: Inflammatory estrogen receptor positive breast cancer  CURRENT THERAPY: Neoadjuvant chemotherapy; goserelin; zoledronate. Today is day 1 cycle #3.   INTERVAL HISTORY: Gabriela Sullivan 30 y.o. female returns to the clinic today for a follow-up visit.  The patient was last seen in the clinic last week.  The patient's treatment has been delayed by 2 weeks until she can have further evaluation of her liver since her AST and ALT have steadily been increasing on weekly labs. Her last chemotherapy was on 12/08/20. She received zometa once on 11/01/20 which is to be given every 12 weeks. She also is on goserelin which she has received 3 total injections. Her most recent injection was on 12/01/20. She had an MRI of the liver performed last week which fortunately, did not show any evidence of metastatic disease but it did note some hepatic steatosis. 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   Overall, the patient is feeling fairly well since her last appointment. She worked a lot yesterday and feels fatigued today. She has some mild nausea which is controlled with her antiemetic.  She denies any abdominal pain, vomiting, alcohol use, diarrhea, or constipation. She denies herbal supplement use and tylenol use. She is supposed to see her PCP today at 3 PM for further testing for her recent finding of hepatic steatosis. She denies any fever, chills, night sweats, or unexplained weight loss. Denies hot flashes, vaginal dryness, or itching. Her appetite is "good".  She denies any chest pain, cough, or shortness of breath.  She denies any peripheral neuropathy.  Her review of systems are otherwise negative. She is scheduled to see Dr. Brantley Stage next week on 12/11/20. The patient is here today for evaluation before starting cycle #3 of treatment.  HISTORY OF CURRENT  ILLNESS: From the original intake note:   Gabriela Sullivan noted a marble sized lump in her left breast in 2020.  She ignored it.  Around September 2021 this mass was "golf sized" and she brought it to her mother's attention.  She did not bring it to medical attention until several months later and she finally underwent bilateral diagnostic mammography with tomography and left breast ultrasonography at [facility] on 09/12/2020 showing: breast density category C; 5.1 cm ill-defined outer left breast mass corresponding to patient's palpable abnormality; at least 4 abnormal left axillary lymph nodes with cortical thickening; indeterminate 0.8 cm group of outer right breast calcifications; indeterminate 6 cm upper-outer left breast calcifications.   Accordingly on 09/26/2020 she proceeded to biopsy of the bilateral breast areas in question. The pathology from this procedure ([id number]) showed: 1. Left Breast, upper-outer             - invasive ductal carcinoma, grade 2.             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 Proliferation marker Ki67 at 10%. HER2 equivocal by immunohistochemistry (2+), but negative by fluorescent in situ hybridization with a signals ratio 1.61 and number per cell 2.65.   2. Right Breast, upper-outer             - low grade ductal carcinoma in situ             -  Prognostic indicators significant for: estrogen receptor, >95% positive and progesterone receptor, >95% positive, both with strong staining intensity.   She underwent additional biopsies on 09/26/2020. Pathology from the procedure ([id number]) showed: 1. Left Breast, 3 o'clock             - invasive ductal carcinoma, grade 2             - ductal carcinoma in situ             - Prognostic indicators significant for:  estrogen receptor, 60% positive with moderate staining intensity and progesterone receptor, 10% positive with strong staining intensity. Proliferation marker Ki67 at 15%. HER2 equivocal by immunohistochemistry (2+), but negative by fluorescent in situ hybridization with a signals ratio 1.66 and number per cell 2.65. 2. Left Axilla Lymph Node             - metastatic carcinoma   Cancer Staging Ductal carcinoma in situ (DCIS) of right breast Staging form: Breast, AJCC 8th Edition - Clinical: Stage 0 (cTis (DCIS), cN0, cM0) - Signed by Chauncey Cruel, MD on 10/04/2020   Malignant neoplasm of upper-outer quadrant of left breast in female, estrogen receptor positive ([place]) Staging form: Breast, AJCC 8th Edition - Clinical stage from 10/04/2020: Stage IIA (cT3, cN1(f), cM0, G2, ER+, PR+, HER2-) - Signed by Chauncey Cruel, MD on 10/04/2020 Stage prefix: Initial diagnosis Method of lymph node assessment: Core biopsy Histologic grading system: 3 grade system   The patient's subsequent history is as detailed below.     MEDICAL HISTORY: Past Medical History:  Diagnosis Date   Asthma    remote   Breast cancer ([place])    Family history of breast cancer    Family history of ovarian cancer    Family history of prostate cancer    Family history of stomach cancer    Migraines    PCOS (polycystic ovarian syndrome)     ALLERGIES:  has No Known Allergies.  MEDICATIONS:  Current Outpatient Medications  Medication Sig Dispense Refill   anastrozole (ARIMIDEX) 1 MG tablet Take 1 tablet (1 mg total) by mouth daily. 30 tablet 2   amitriptyline (ELAVIL) 50 MG tablet Take 50 mg by mouth at bedtime.     dexamethasone (DECADRON) 4 MG tablet Take 2 tablets (8 mg total) by mouth daily. Take daily for 3 days after chemo. Take with food. 30 tablet 1   Goserelin Acetate (ZOLADEX [place]) Inject into the skin once a week.     ibuprofen (ADVIL) 800 MG tablet Take 1 tablet (800 mg total) by mouth every 8 (eight)  hours as needed. 30 tablet 0   lidocaine-prilocaine (EMLA) cream Apply to affected area once 30 g 3   magic mouthwash SOLN Take 5 mLs by mouth 4 (four) times daily as needed for mouth pain. 240 mL 0   oxyCODONE (OXY IR/ROXICODONE) 5 MG immediate release tablet Take 1 tablet (5 mg total) by mouth every 6 (six) hours as needed for severe pain. 15 tablet 0   prochlorperazine (COMPAZINE) 10 MG tablet Take 1 tablet (10 mg total) by mouth every 6 (six) hours as needed (Nausea or vomiting). 30 tablet 1   UDENYCA 6 MG/0.6ML injection Inject into the skin.     Vitamin D, Ergocalciferol, (DRISDOL) 50000 UNITS CAPS capsule Take 50,000 Units by mouth every 7 (seven) days.     No current facility-administered medications for this visit.    SURGICAL HISTORY:  Past Surgical History:  Procedure Laterality Date  NO PAST SURGERIES     PORTACATH PLACEMENT Right 10/18/2020   Procedure: INSERTION PORT-A-CATH;  Surgeon: Erroll Luna, MD;  Location: [facility];  Service: General;  Laterality: Right;    REVIEW OF SYSTEMS:   Review of Systems  Constitutional: Positive for stable fatigue. Negative for appetite change, chills, fever and unexpected weight change. HENT:   Negative for mouth sores, nosebleeds, sore throat and trouble swallowing.   Eyes: Negative for eye problems and icterus. Respiratory: Negative for cough, hemoptysis, shortness of breath and wheezing.   Cardiovascular: Positive for occasional cold sensation in chest. Negative for leg swelling. Gastrointestinal: Positive for occasional nausea. Negative for abdominal pain, constipation, diarrhea, and vomiting. Genitourinary: Negative for bladder incontinence, difficulty urinating, dysuria, frequency and hematuria.   Musculoskeletal: Positive for back pain (stable since diagnosis). Negative for gait problem, neck pain and neck stiffness. Skin: Negative for itching and rash. Neurological: Negative for dizziness, extremity weakness,  gait problem, headaches, light-headedness and seizures. Hematological: Negative for adenopathy. Does not bruise/bleed easily. Psychiatric/Behavioral: Negative for confusion, depression and sleep disturbance. The patient is not nervous/anxious.     PHYSICAL EXAMINATION:  Blood pressure (!) 150/90, pulse 72, temperature 98.3 F (36.8 C), temperature source Oral, resp. rate 17, weight 198 lb 1.6 oz (89.9 kg), SpO2 100 %.  ECOG PERFORMANCE STATUS: 1  Physical Exam  Constitutional: Oriented to person, place, and time and well-developed, well-nourished, and in no distress.  HENT:  Head: Normocephalic and atraumatic.  Mouth/Throat: Oropharynx is clear and moist. No oropharyngeal exudate.  Eyes: Conjunctivae are normal. Right eye exhibits no discharge. Left eye exhibits no discharge. No scleral icterus.  Neck: Normal range of motion. Neck supple.  Cardiovascular: Normal rate, regular rhythm, normal heart sounds and intact distal pulses.   Pulmonary/Chest: Effort normal and breath sounds normal. No respiratory distress. No wheezes. No rales.  Abdominal: Soft. Bowel sounds are normal. Exhibits no distension and no mass. There is no tenderness.  Musculoskeletal: Normal range of motion. Exhibits no edema.  Lymphadenopathy:    No cervical adenopathy.  Neurological: Alert and oriented to person, place, and time. Exhibits normal muscle tone. Gait normal. Coordination normal.  Skin: Skin is warm and dry. No rash noted. Not diaphoretic. No erythema. No pallor.  Psychiatric: Mood, memory and judgment normal.  Vitals reviewed.  LABORATORY DATA: Lab Results  Component Value Date   WBC 5.0 12/06/2020   HGB 12.2 12/06/2020   HCT 35.0 (L) 12/06/2020   MCV 83.5 12/06/2020   PLT 267 12/06/2020      Chemistry      Component Value Date/Time   NA 140 12/06/2020 0946   K 3.9 12/06/2020 0946   CL 107 12/06/2020 0946   CO2 26 12/06/2020 0946   BUN 12 12/06/2020 0946   CREATININE 0.60 12/06/2020 0946    CREATININE 0.64 10/04/2020 0755      Component Value Date/Time   CALCIUM 8.8 (L) 12/06/2020 0946   ALKPHOS 63 12/06/2020 0946   AST 186 (HH) 12/06/2020 0946   AST 47 (H) 10/04/2020 0755   ALT 339 (HH) 12/06/2020 0946   ALT 108 (H) 10/04/2020 0755   BILITOT 0.5 12/06/2020 0946   BILITOT 0.7 10/04/2020 0755       RADIOGRAPHIC STUDIES:  DG Chest 2 View  Result Date: 11/29/2020 CLINICAL DATA:  Inflammatory breast cancer new diagnosis EXAM: CHEST - 2 VIEW COMPARISON:  10/18/2020 FINDINGS: Heart size upper normal. Vascularity normal. Lungs are clear without infiltrate effusion or mass. Port-A-Cath tip in  the SVC unchanged. No interval change. IMPRESSION: No active cardiopulmonary disease. Electronically Signed   By: Franchot Gallo M.D.   On: 11/29/2020 09:50   MR LIVER W WO CONTRAST  Result Date: 12/01/2020 CLINICAL DATA:  Screening for liver mets. Inflammatory breast cancer with increasing LFTs. EXAM: MRI ABDOMEN WITHOUT AND WITH CONTRAST TECHNIQUE: Multiplanar multisequence MR imaging of the abdomen was performed both before and after the administration of intravenous contrast. CONTRAST:  52mL GADAVIST GADOBUTROL 1 MMOL/ML IV SOLN COMPARISON:  None. FINDINGS: Lower chest: Unremarkable. Hepatobiliary: Diffuse loss of signal intensity in the liver parenchyma on out of phase T1 imaging is compatible with fatty deposition. No suspicious enhancing liver lesion to suggest metastatic disease. No intrahepatic or extrahepatic biliary dilation. Gallbladder is nondistended. Pancreas: No focal mass lesion. No dilatation of the main duct. No intraparenchymal cyst. No peripancreatic edema. Spleen:  No splenomegaly. No focal mass lesion. Adrenals/Urinary Tract: No adrenal nodule or mass. Tiny T2 hyperintensity in the upper pole right kidney is too small to characterize but likely a cyst. Left kidney unremarkable. Stomach/Bowel: Stomach is unremarkable. No gastric wall thickening. No evidence of outlet  obstruction. Duodenum is normally positioned as is the ligament of Treitz. No small bowel wall thickening. No small bowel dilatation. Vascular/Lymphatic: No abdominal aortic aneurysm. There is no gastrohepatic or hepatoduodenal ligament lymphadenopathy. No retroperitoneal or mesenteric lymphadenopathy. Other:  No intraperitoneal free fluid. Musculoskeletal: Numerous tiny enhancing lesions in the spine are compatible with metastatic disease, better characterized on recent spine MRI of 10/21/2020. IMPRESSION: 1. No evidence for metastatic disease to the liver. No intra or extrahepatic biliary duct dilatation. 2. Hepatic steatosis. 3. Numerous tiny enhancing lesions in the spine are compatible with metastatic disease, better characterized on recent spine MRI of 10/21/2020. Electronically Signed   By: Misty Stanley M.D.   On: 12/01/2020 08:20     ASSESSMENT/PLAN:  ELIGIBLE FOR AVAILABLE RESEARCH PROTOCOL:   ASSESSMENT: 30 y.o. [place] 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 as follows:             (0) status post left breast biopsies x2 and left axilla lymph node biopsy 09/26/2020 for a clinical T3 N1(f), stage IIa invasive ductal carcinoma, grade 2, estrogen and progesterone receptor positive, HER2 not amplified, with an MIB-1 in the 10-15% range             (a) right breast biopsy 09/25/2020 showed ductal carcinoma in situ, grade 1, estrogen and progesterone receptor positive             (b) breast MRI 10/06/2020 shows a 11 cm area of malignancy, with a second 1.9 cm mass, and skin thickening concerning for inflammatory breast carcinoma; at least 6 abnormal lymph nodes noted             (c) chest CT scan 10/13/2020 shows clear lungs and no obvious liver involvement but multiple lytic bone lesions in addition to the findings and MRI above             (d) bone scan 10/13/2020 shows nonspecific findings (lytic lesions not expected to be "hot")             (e) total spinal MRI  10/21/2020 shows 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  No cervical vertebral metastases noted.  There is also degenerative disc disease.   (  1) genetics testing 11/25/2020 through the [facility] [employer] panel found no deleterious mutations in AIP, ALK, APC, ATM, AXIN2, BAP1, BARD1, BLM, BMPR1A, BRCA1, BRCA2, BRIP1, CDC73, CDH1, CDK4, CDKN1B, CDKN2A, CHEK2, CTNNA1, DICER1, FANCC, FH, FLCN, GALNT12, KIF1B, LZTR1, MAX, MEN1, MET, MLH1, MSH2, MSH3, MSH6, MUTYH, NBN, NF1, NF2, NTHL1, PALB2, PHOX2B, PMS2, POT1, PRKAR1A, PTCH1, PTEN, RAD51C, RAD51D, RB1, RECQL, RET, SDHA, SDHAF2, SDHB, SDHC, SDHD, SMAD4, SMARCA4, SMARCB1, SMARCE1, STK11, SUFU, TMEM127, TP53, TSC1, TSC2, VHL and XRCC2 (sequencing and deletion/duplication); EGFR, EGLN1, HOXB13, KIT, MITF, PDGFRA, POLD1 and POLE (sequencing only); EPCAM and GREM1 (deletion/duplication only). RNA data is routinely analyzed for use in variant interpretation for all genes.   (2) neoadjuvant chemotherapy consisting of cyclophosphamide and doxorubicin in dose dense fashion x4 starting 10/24/2020, possibly followed by paclitaxel weekly x12             (a) echo 10/13/2020 showed an ejection fraction in the 60-65% range  (b) chemotherapy discontinued after two cycles of CA due to rise in LFTs  (c) liver MRI 11/30/2020 shows no metastatic disease, significant hepatic steatosis   (3) definitive surgery pending   (4) adjuvant radiation to the left side to follow   (5) anastrozole started 12/06/2020             (a) goserelin started 10/11/2020   (6) zolendronate started 11/02/2018. Every 12 weeks.    PLAN: Emelia is tolerating her chemotherapy well and we are seeing signs of response. However, she received 2 cycles of chemotherapy and she is having progressively  worsening AST/ALT every week despite not receiving chemotherapy since 11/08/20. She had an MRI performed last week which did not show metastatic disease or acute process but saw some liver parenchymal changes most consistent with hepatic steatosis. I gave the patient a handout on patient education for hepatic steatosis today and discussed diet, exercise, and weight management. She is seeing her PCP later today to discuss this. She also is having some further lab studies performed for this at her PCP's office later today as 3 PM. I also recommended she talk to her PCP about diabetes testing since her blood sugar has been elevated on labs the last few lab draws.   The patient was seen with Dr. Jana Hakim today due to the elevated LFTs. Dr. Jana Hakim recommended discontinuing the chemotherapy at this point. She is ER/PR positive. He recommended starting on anastrozole 1 mg p.o. daily in addition to Zometa and goserelin. I have sent the prescription to the pharmacy. She was instructed to begin taking this. She is not due for zometa or goserelin today so we will cancel her infusion appointment for today.   We will also arrange for the patient to have an acute hepatitis panel performed today to evaluate other etiologies of her elevated LFTs.   The patient will see her surgeon on 12/11/20. She will follow up with Dr. Jana Hakim in 2-3 weeks.    The patient was advised to call immediately if she has any concerning symptoms in the interval. The patient voices understanding of current disease status and treatment options and is in agreement with the current care plan. All questions were answered. The patient knows to call the clinic with any problems, questions or concerns. We can certainly see the patient much sooner if necessary   Orders Placed This Encounter  Procedures   Hepatitis panel, acute    Standing Status:   Future    Number of Occurrences:   1    Standing Expiration  Date:   12/06/2021     Cassandra L  Heilingoetter, PA-C 12/06/20   ADDENDUM: [name] tolerated the chemotherapy well in general and seems to have had some response to treatment after only 2 cycles.  However her liver function tests have continued to rise.  We obtained a liver MRI with and without contrast 11/30/2020 and this showed no evidence of metastatic disease.  There was significant hepatic steatosis.  It could be that the chemotherapy and steatosis are interacting to cause the elevated liver function tests.  I certainly have no other explanation as her hepatitis viral studies are negative and her other drugs are unlikely to cause this problem.  Accordingly we are stopping her chemotherapy.  She is proceeding to surgery and already has an appointment with Dr. Brantley Stage next week.  She has been on goserelin with her third dose 12/01/2020, so at this point she is medically postmenopausal.  We are currently adding anastrozole to her treatment after discussing the possible toxicities side effects and complications with her.  She will see me again in 4 weeks with her next Zoladex treatment.  I personally saw this patient and performed a substantive portion of this encounter with the listed APP documented above.   Total encounter time 25 minutes.Chauncey Cruel, MD Medical Oncology and Hematology [facility] [address] [place], [place] [address] Tel. [phone]    Fax. [phone]

## 2020-12-01 NOTE — Patient Instructions (Signed)
Goserelin injection What is this medication? GOSERELIN (GOE se rel in) is similar to a hormone found in the body. It lowers the amount of sex hormones that the body makes. Men will have lower testosterone levels and women will have lower estrogen levels while taking this medicine. In men, this medicine is used to treat prostate cancer; the injection is either given once per month or once every 12 weeks. A once per month injection (only) is used to treat women with endometriosis, dysfunctional uterine bleeding, or advanced breast cancer. This medicine may be used for other purposes; ask your health care provider or pharmacist if you have questions. COMMON BRAND NAME(S): Zoladex What should I tell my care team before I take this medication? They need to know if you have any of these conditions: bone problems diabetes heart disease history of irregular heartbeat an unusual or allergic reaction to goserelin, other medicines, foods, dyes, or preservatives pregnant or trying to get pregnant breast-feeding How should I use this medication? This medicine is for injection under the skin. It is given by a health care professional in a hospital or clinic setting. Talk to your pediatrician regarding the use of this medicine in children. Special care may be needed. Overdosage: If you think you have taken too much of this medicine contact a poison control center or emergency room at once. NOTE: This medicine is only for you. Do not share this medicine with others. What if I miss a dose? It is important not to miss your dose. Call your doctor or health care professional if you are unable to keep an appointment. What may interact with this medication? Do not take this medicine with any of the following medications: cisapride dronedarone pimozide thioridazine This medicine may also interact with the following medications: other medicines that prolong the QT interval (an abnormal heart rhythm) This list  may not describe all possible interactions. Give your health care provider a list of all the medicines, herbs, non-prescription drugs, or dietary supplements you use. Also tell them if you smoke, drink alcohol, or use illegal drugs. Some items may interact with your medicine. What should I watch for while using this medication? Visit your doctor or health care provider for regular checks on your progress. Your symptoms may appear to get worse during the first weeks of this therapy. Tell your doctor or healthcare provider if your symptoms do not start to get better or if they get worse after this time. Your bones may get weaker if you take this medicine for a long time. If you smoke or frequently drink alcohol you may increase your risk of bone loss. A family history of osteoporosis, chronic use of drugs for seizures (convulsions), or corticosteroids can also increase your risk of bone loss. Talk to your doctor about how to keep your bones strong. This medicine should stop regular monthly menstruation in women. Tell your doctor if you continue to menstruate. Women should not become pregnant while taking this medicine or for 12 weeks after stopping this medicine. Women should inform their doctor if they wish to become pregnant or think they might be pregnant. There is a potential for serious side effects to an unborn child. Talk to your health care professional or pharmacist for more information. Do not breast-feed an infant while taking this medicine. Men should inform their doctors if they wish to father a child. This medicine may lower sperm counts. Talk to your health care professional or pharmacist for more information. This medicine may   increase blood sugar. Ask your healthcare provider if changes in diet or medicines are needed if you have diabetes. What side effects may I notice from receiving this medication? Side effects that you should report to your doctor or health care professional as soon as  possible: allergic reactions like skin rash, itching or hives, swelling of the face, lips, or tongue bone pain breathing problems changes in vision chest pain feeling faint or lightheaded, falls fever, chills pain, swelling, warmth in the leg pain, tingling, numbness in the hands or feet signs and symptoms of high blood sugar such as being more thirsty or hungry or having to urinate more than normal. You may also feel very tired or have blurry vision signs and symptoms of low blood pressure like dizziness; feeling faint or lightheaded, falls; unusually weak or tired stomach pain swelling of the ankles, feet, hands trouble passing urine or change in the amount of urine unusually high or low blood pressure unusually weak or tired Side effects that usually do not require medical attention (report to your doctor or health care professional if they continue or are bothersome): change in sex drive or performance changes in breast size in both males and females changes in emotions or moods headache hot flashes irritation at site where injected loss of appetite skin problems like acne, dry skin vaginal dryness This list may not describe all possible side effects. Call your doctor for medical advice about side effects. You may report side effects to FDA at 1-800-FDA-1088. Where should I keep my medication? This drug is given in a hospital or clinic and will not be stored at home. NOTE: This sheet is a summary. It may not cover all possible information. If you have questions about this medicine, talk to your doctor, pharmacist, or health care provider.  2022 Elsevier/Gold Standard (2018-09-07 14:05:56)  

## 2020-12-01 NOTE — Telephone Encounter (Signed)
I called the patient to let her know that there was no evidence of metastatic disease to the liver. There also was no intra or extrahepatic biliary duct dilation. The scan did note some hepatic steatosis. I reviewed this with her and can give her more information when I see her for her upcoming appointment. Briefly reviewed managing risk factors is recommended for all patient's including exercise, healthy weight loss, and avoiding excessive alcohol or other substances that can damage the liver. Just wanted to reassure her going into the long weekend that there was no metastatic disease to the liver and we would discuss in more detail at her next appointment. She expressed understanding.

## 2020-12-06 ENCOUNTER — Other Ambulatory Visit: Payer: Self-pay

## 2020-12-06 ENCOUNTER — Ambulatory Visit: Payer: Self-pay

## 2020-12-06 ENCOUNTER — Other Ambulatory Visit: Payer: Self-pay | Admitting: Oncology

## 2020-12-06 ENCOUNTER — Inpatient Hospital Stay: Payer: Medicaid Other

## 2020-12-06 ENCOUNTER — Other Ambulatory Visit: Payer: Self-pay | Admitting: *Deleted

## 2020-12-06 ENCOUNTER — Ambulatory Visit: Payer: Self-pay | Admitting: Physician Assistant

## 2020-12-06 ENCOUNTER — Ambulatory Visit: Payer: Medicaid Other

## 2020-12-06 ENCOUNTER — Inpatient Hospital Stay (HOSPITAL_BASED_OUTPATIENT_CLINIC_OR_DEPARTMENT_OTHER): Payer: Medicaid Other | Admitting: Physician Assistant

## 2020-12-06 ENCOUNTER — Encounter: Payer: Self-pay | Admitting: Oncology

## 2020-12-06 VITALS — BP 150/90 | HR 72 | Temp 98.3°F | Resp 17 | Wt 198.1 lb

## 2020-12-06 DIAGNOSIS — R7989 Other specified abnormal findings of blood chemistry: Secondary | ICD-10-CM | POA: Diagnosis not present

## 2020-12-06 DIAGNOSIS — Z95828 Presence of other vascular implants and grafts: Secondary | ICD-10-CM

## 2020-12-06 DIAGNOSIS — C50412 Malignant neoplasm of upper-outer quadrant of left female breast: Secondary | ICD-10-CM | POA: Diagnosis not present

## 2020-12-06 DIAGNOSIS — Z170 Estrogen receptor positive status [ER+]: Secondary | ICD-10-CM | POA: Diagnosis not present

## 2020-12-06 DIAGNOSIS — Z5111 Encounter for antineoplastic chemotherapy: Secondary | ICD-10-CM | POA: Diagnosis not present

## 2020-12-06 LAB — CBC WITH DIFFERENTIAL/PLATELET
Abs Immature Granulocytes: 0.02 10*3/uL (ref 0.00–0.07)
Basophils Absolute: 0 10*3/uL (ref 0.0–0.1)
Basophils Relative: 1 %
Eosinophils Absolute: 0.2 10*3/uL (ref 0.0–0.5)
Eosinophils Relative: 3 %
HCT: 35 % — ABNORMAL LOW (ref 36.0–46.0)
Hemoglobin: 12.2 g/dL (ref 12.0–15.0)
Immature Granulocytes: 0 %
Lymphocytes Relative: 25 %
Lymphs Abs: 1.3 10*3/uL (ref 0.7–4.0)
MCH: 29.1 pg (ref 26.0–34.0)
MCHC: 34.9 g/dL (ref 30.0–36.0)
MCV: 83.5 fL (ref 80.0–100.0)
Monocytes Absolute: 0.5 10*3/uL (ref 0.1–1.0)
Monocytes Relative: 9 %
Neutro Abs: 3.1 10*3/uL (ref 1.7–7.7)
Neutrophils Relative %: 62 %
Platelets: 267 10*3/uL (ref 150–400)
RBC: 4.19 MIL/uL (ref 3.87–5.11)
RDW: 14.7 % (ref 11.5–15.5)
WBC: 5 10*3/uL (ref 4.0–10.5)
nRBC: 0 % (ref 0.0–0.2)

## 2020-12-06 LAB — COMPREHENSIVE METABOLIC PANEL
ALT: 339 U/L (ref 0–44)
AST: 186 U/L (ref 15–41)
Albumin: 3.9 g/dL (ref 3.5–5.0)
Alkaline Phosphatase: 63 U/L (ref 38–126)
Anion gap: 7 (ref 5–15)
BUN: 12 mg/dL (ref 6–20)
CO2: 26 mmol/L (ref 22–32)
Calcium: 8.8 mg/dL — ABNORMAL LOW (ref 8.9–10.3)
Chloride: 107 mmol/L (ref 98–111)
Creatinine, Ser: 0.6 mg/dL (ref 0.44–1.00)
GFR, Estimated: >60 mL/min (ref >60)
Glucose, Bld: 229 mg/dL — ABNORMAL HIGH (ref 70–99)
Potassium: 3.9 mmol/L (ref 3.5–5.1)
Sodium: 140 mmol/L (ref 135–145)
Total Bilirubin: 0.5 mg/dL (ref 0.3–1.2)
Total Protein: 7.3 g/dL (ref 6.5–8.1)

## 2020-12-06 LAB — HEPATITIS PANEL, ACUTE
HCV Ab: NONREACTIVE
Hep A IgM: NONREACTIVE
Hep B C IgM: NONREACTIVE
Hepatitis B Surface Ag: NONREACTIVE

## 2020-12-06 MED ORDER — SODIUM CHLORIDE 0.9% FLUSH
10.0000 mL | INTRAVENOUS | Status: DC | PRN
  Administered 2020-12-06: 10 mL via INTRAVENOUS
  Filled 2020-12-06: qty 10

## 2020-12-06 MED ORDER — ANASTROZOLE 1 MG PO TABS: 1.0000 mg | ORAL_TABLET | Freq: Every day | ORAL | 2 refills | Status: DC

## 2020-12-06 NOTE — Patient Instructions (Signed)

## 2020-12-07 ENCOUNTER — Telehealth: Payer: Self-pay | Admitting: Oncology

## 2020-12-07 ENCOUNTER — Encounter: Payer: Self-pay | Admitting: *Deleted

## 2020-12-07 NOTE — Telephone Encounter (Signed)
R/s appts per 7/6 sch msg. Pt aware.  

## 2020-12-08 ENCOUNTER — Ambulatory Visit: Payer: Self-pay

## 2020-12-11 ENCOUNTER — Ambulatory Visit: Payer: Self-pay | Admitting: Surgery

## 2020-12-11 NOTE — H&P (Signed)
PROVIDER:  Kennieth Francois, MD   MRN: [id number] DOB: January 08, 1991 DATE OF ENCOUNTER: 12/11/2020 Subjective    Chief Complaint: surgery comsult  30 y.o. 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 as follows:             (0) status post left breast biopsies x2 and left axilla lymph node biopsy 09/26/2020 for a clinical T3 N1(f), stage IIa invasive ductal carcinoma, grade 2, estrogen and progesterone receptor positive, HER2 not amplified, with an MIB-1 in the 10-15% range             (a) right breast biopsy 09/25/2020 showed ductal carcinoma in situ, grade 1, estrogen and progesterone receptor positive             (b) breast MRI 10/06/2020 shows a 11 cm area of malignancy, with a second 1.9 cm mass, and skin thickening concerning for inflammatory breast carcinoma; at least 6 abnormal lymph nodes noted             (c) chest CT scan 10/13/2020 shows clear lungs and no obvious liver involvement but multiple lytic bone lesions in addition to the findings and MRI above             (d) bone scan 10/13/2020 shows nonspecific findings (lytic lesions not expected to be "hot")             (e) total spinal MRI 10/21/2020 shows 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  No cervical vertebral metastases noted.  There is also degenerative disc disease.     History of Present Illness: Gabriela Sullivan is a 30 y.o. female who is seen today for LEFT BREAST INFLAMMATORY CANCER FOLLOW up and right DCIS  She is tolerating chemotherapy well.  It was stopped due to elevation in liver function studies.  Liver MRI was read as normal with no visceral disease noted either on CT scan or MRI.  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  She is  here to discuss the role of surgery with stage IV breast cancer given her bony metastasis.  Overall, she feels well.                       30 y.o. 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 as follows:             (0) status post left breast biopsies x2 and left axilla lymph node biopsy 09/26/2020 for a clinical T3 N1(f), stage IIa invasive ductal carcinoma, grade 2, estrogen and progesterone receptor positive, HER2 not amplified, with an MIB-1 in the 10-15% range             (a) right breast biopsy 09/25/2020 showed ductal carcinoma in situ, grade 1, estrogen and progesterone receptor positive             (b) breast MRI 10/06/2020 shows a 11 cm area of malignancy, with a second 1.9 cm mass, and skin thickening concerning for inflammatory breast carcinoma; at least 6 abnormal lymph nodes noted             (c) chest CT scan 10/13/2020 shows clear lungs and no obvious liver involvement but multiple lytic bone lesions  in addition to the findings and MRI above             (d) bone scan 10/13/2020 shows nonspecific findings (lytic lesions not expected to be "hot")             (e) total spinal MRI 10/21/2020 shows 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  No cervical vertebral metastases noted.  There is also degenerative disc disease.       Review of Systems: A complete review of systems was obtained from the patient.  I have reviewed this information and discussed as appropriate with the patient.  See HPI as well for other ROS.   Review of Systems  Constitutional: Negative.   HENT: Negative.   Gastrointestinal: Negative.   Musculoskeletal: Negative for back pain and myalgias.  Skin: Negative.   Neurological: Negative.   Psychiatric/Behavioral: Negative for depression and  suicidal ideas.        Medical History: Past Medical History      Past Medical History:  Diagnosis Date   Asthma, unspecified asthma severity, unspecified whether complicated, unspecified whether persistent     History of cancer          There is no problem list on file for this patient.     Past Surgical History       Past Surgical History:  Procedure Laterality Date   port placement surgery  N/A          Allergies  No Known Allergies           Current Outpatient Medications on File Prior to Visit  Medication Sig Dispense Refill   anastrozole (ARIMIDEX) 1 mg tablet          No current facility-administered medications on file prior to visit.      Family History       Family History  Problem Relation Age of Onset   Diabetes Mother     High blood pressure (Hypertension) Mother     Breast cancer Other          Social History       Tobacco Use  Smoking Status Never Smoker  Smokeless Tobacco Never Used      Social History  Social History        Socioeconomic History   Marital status: Unknown  Tobacco Use   Smoking status: Never Smoker   Smokeless tobacco: Never Used  Substance and Sexual Activity   Alcohol use: Never   Drug use: Never   Sexual activity: Never        Objective:      There were no vitals filed for this visit.  There is no height or weight on file to calculate BMI.       General appearance - alert, well appearing, and in no distress Mental status - alert, oriented to person, place, and time Chest - clear to auscultation, no wheezes, rales or rhonchi, symmetric air entry Heart - normal rate, regular rhythm, normal S1, S2, no murmurs, rubs, clicks or gallops Breasts - breasts appear normal, no suspicious masses, no skin or nipple changes or axillary nodes, Previous left breast mass, redness and thickening are gone.  No left axillary adenopathy.  Mild fullness just behind the nipple noted on the left.  Right breast shows no  mass lesion today. Neurological - alert, oriented, normal  speech, no focal findings or movement disorder noted Skin - normal coloration and turgor, no rashes, no suspicious skin lesions noted     Labs, Imaging and Diagnostic Testing:   CLINICAL DATA:  Screening for liver mets. Inflammatory breast cancer with increasing LFTs.   EXAM: MRI ABDOMEN WITHOUT AND WITH CONTRAST   TECHNIQUE: Multiplanar multisequence MR imaging of the abdomen was performed both before and after the administration of intravenous contrast.   CONTRAST:  28mL GADAVIST GADOBUTROL 1 MMOL/ML IV SOLN   COMPARISON:  None.   FINDINGS: Lower chest: Unremarkable.   Hepatobiliary: Diffuse loss of signal intensity in the liver parenchyma on out of phase T1 imaging is compatible with fatty deposition. No suspicious enhancing liver lesion to suggest metastatic disease. No intrahepatic or extrahepatic biliary dilation. Gallbladder is nondistended.   Pancreas: No focal mass lesion. No dilatation of the main duct. No intraparenchymal cyst. No peripancreatic edema.   Spleen:  No splenomegaly. No focal mass lesion.   Adrenals/Urinary Tract: No adrenal nodule or mass. Tiny T2 hyperintensity in the upper pole right kidney is too small to characterize but likely a cyst. Left kidney unremarkable.   Stomach/Bowel: Stomach is unremarkable. No gastric wall thickening. No evidence of outlet obstruction. Duodenum is normally positioned as is the ligament of Treitz. No small bowel wall thickening. No small bowel dilatation.   Vascular/Lymphatic: No abdominal aortic aneurysm. There is no gastrohepatic or hepatoduodenal ligament lymphadenopathy. No retroperitoneal or mesenteric lymphadenopathy.   Other:  No intraperitoneal free fluid.   Musculoskeletal: Numerous tiny enhancing lesions in the spine are compatible with metastatic disease, better characterized on recent spine MRI of 10/21/2020.   IMPRESSION: 1. No evidence  for metastatic disease to the liver. No intra or extrahepatic biliary duct dilatation. 2. Hepatic steatosis. 3. Numerous tiny enhancing lesions in the spine are compatible with metastatic disease, better characterized on recent spine MRI of 10/21/2020.     Electronically Signed   By: Misty Stanley M.D.   On: 12/01/2020 08:20   CLINICAL DATA:  Recent diagnosis left grade 2 invasive ductal carcinoma and ductal carcinoma in situ, low grade ductal carcinoma in situ of the right breast and a metastatic left axillary lymph node.   LABS:  No labs drawn at time of imaging.   EXAM: BILATERAL BREAST MRI WITH AND WITHOUT CONTRAST   TECHNIQUE: Multiplanar, multisequence MR images of both breasts were obtained prior to and following the intravenous administration of 10 ml of Gadavist   Three-dimensional MR images were rendered by post-processing of the original MR data on an independent workstation. The three-dimensional MR images were interpreted, and findings are reported in the following complete MRI report for this study. Three dimensional images were evaluated at the independent interpreting workstation using the DynaCAD thin client.   COMPARISON:  Prior exams.   FINDINGS: Breast composition: c. Heterogeneous fibroglandular tissue.   Background parenchymal enhancement: Mild   Right breast: There is enhancement extending obliquely from superior to inferior consistent with the previous stereotactic biopsy tract. Subtle susceptibility artifact lies along the inferior aspect of this tract consistent with the post biopsy X shaped biopsy clip. At the level of the clip, surrounding enhancement measures 8 mm in greatest dimension. This reflects the low-grade DCIS found on biopsy.   There are no other areas of abnormal right breast enhancement. No defined masses.   Left breast: Large heterogeneous area predominantly non mass enhancement extends from the anterior to the posterior  breast, throughout the upper outer and lower  outer quadrants, largest discrete area in the lower outer quadrant, where there is a small focus of susceptibility artifact consistent with the heart shaped biopsy clip. This portion of the abnormal enhancement, in the lower outer quadrant, spans approximately 5.8 x 3.8 cm transversely. The overall span of the abnormal enhancement measures 11 cm anterior-posterior by 9 cm, superior inferior, by 7 cm right to left.   There is separate mass-like area of abnormal enhancement along the inferior, anterior breast, projecting near 6 o'clock, measuring 1.9 x 0.9 x 1.2 cm. This area demonstrates washout kinetics.   There is skin thickening with hazy increased skin enhancement throughout the left breast most prominent laterally.   Lymph nodes: Multiple enlarged/abnormal left axillary lymph nodes. Artifact from the tribell biopsy clip lies within one of the more lateral lymph nodes. Largest node measures 2.4 cm in short axis. Six lymph nodes are abnormal by size and/or cortical thickness. There are additional prominent subcentimeter lymph nodes. No enlarged or abnormal right axillary lymph nodes.   Ancillary findings:  None.   IMPRESSION: 1. Extensive abnormal, predominantly non mass enhancement throughout the lateral left breast involving the lower outer and upper outer quadrants, consistent with a more extensive expanse of malignancy than evident on mammography or ultrasound. This spans 11 x 7 x 9 cm. 2. Additional mass-like abnormal enhancement in the anterior, inferior left breast suspicious for an additional focus of carcinoma. This spans 1.9 x 0.9 x 1.2 cm. 3. Skin thickening and enhancement of the left breast, which predominates laterally. Findings concerning for inflammatory breast carcinoma. 4. 8 mm area of enhancement surrounds the X shaped biopsy clip in right breast, reflecting the biopsy-proven DCIS. Enhancement is also noted along  the biopsy tract. There are no findings to suggest additional right breast malignancy. 5. Multiple abnormal left axillary lymph nodes, 6 of which are enlarged/abnormal by size/cortical thickness, 1 of these being the biopsy-proven metastatic lymph node.   RECOMMENDATION: 1. MRI guided biopsy the 1.9 cm mass-like area of abnormal enhancement along the anterior, inferior left breast, if this would alter treatment. 2. Otherwise treatment as planned for the known multifocal left breast carcinoma and metastatic lymphadenopathy and small focus of right breast DCIS.   BI-RADS CATEGORY  4: Suspicious.     Electronically Signed   By: Lajean Manes M.D.   On: 10/06/2020 10:58 Assessment and Plan:  Diagnoses and all orders for this visit:   Breast cancer, stage 4, left (CMS-HCC)   Inflammatory breast cancer, left (CMS-HCC)   Ductal carcinoma in situ (DCIS) of right breast       Patient has stage IV disease but only bony involvement.  She had a good response to chemotherapy.  I discussed the pros and cons of mastectomy in the setting but given only her bony metastasis and excellent response to chemotherapy, I feel mastectomy would reduce her risk of local regional recurrence and improve her quality of life.  I discussed reconstruction as well but given her stage IV disease that may need to be addressed at a later time.  She is in total agreement with left modified radical mastectomy and right simple mastectomy.  Plan on doing a right sentinel node.  Discussed the pros and cons and the fact this will not necessarily lengthen her survival but I think has a reasonable chance at improving her quality of life.  She is in total agreement with surgery would like to proceed to soon as possible.  Risk of ostectomy include bleeding, infection, flap  necrosis, lymphedema, numbness, chronic pain, seroma, cosmesis, cardiovascular risk, anesthesia risk, death, DVT, the need for the treatments and/or  procedures.   No follow-ups on file.   Kennieth Francois, MD    I had direct face-to-face contact with the patient for a total of 30 minutes and greater than 50% of that time was spent providing counseling and/or coordination of

## 2020-12-12 ENCOUNTER — Encounter: Payer: Self-pay | Admitting: *Deleted

## 2020-12-13 ENCOUNTER — Ambulatory Visit: Payer: Self-pay | Admitting: Surgery

## 2020-12-13 ENCOUNTER — Ambulatory Visit: Payer: Self-pay

## 2020-12-13 ENCOUNTER — Other Ambulatory Visit: Payer: Self-pay

## 2020-12-13 ENCOUNTER — Ambulatory Visit: Payer: Self-pay | Admitting: Physician Assistant

## 2020-12-13 DIAGNOSIS — D0591 Unspecified type of carcinoma in situ of right breast: Secondary | ICD-10-CM

## 2020-12-14 ENCOUNTER — Encounter: Payer: Self-pay | Admitting: *Deleted

## 2020-12-15 ENCOUNTER — Ambulatory Visit: Payer: Self-pay

## 2020-12-18 NOTE — Pre-Procedure Instructions (Addendum)
Surgical Instructions:    Your procedure is scheduled on Wednesday, July 20th (11:30 AM- 2 PM).  Report to [facility] Main Entrance "A" at 09:30 A.M., then check in with the Admitting office.  Call this number if you have any questions prior to your surgery date, or have problems the morning of surgery:  [phone]    Remember:  Do not eat after midnight the night before your surgery.  You may drink clear liquids until 08:30 AM the morning of your surgery.   Clear liquids allowed are: Water, Non-Citrus Juices (without pulp), Carbonated Beverages, Clear Tea, Black Coffee Only, and Gatorade.  The day of surgery (if you do NOT have diabetes):  Drink ONE (1) Pre-Surgery Clear Ensure by 08:30 AM the morning of surgery.   This drink was given to you during your hospital pre-op appointment visit. Nothing else to drink after completing the Pre-Surgery Clear Ensure.         If you have questions, please contact your surgeon's office.     Take these medicines the morning of surgery with A SIP OF WATER:   dexamethasone (DECADRON)   IF NEEDED: prochlorperazine (COMPAZINE)    As of today, STOP taking any Aspirin (unless otherwise instructed by your surgeon) Aleve, Naproxen, Ibuprofen, Motrin, Advil, Goody's, BC's, all herbal medications, fish oil, and all vitamins.             Special instructions:    [facility]- Preparing For Surgery  Before surgery, you can play an important role. Because skin is not sterile, your skin needs to be as free of germs as possible. You can reduce the number of germs on your skin by washing with CHG (chlorahexidine gluconate) Soap before surgery.  CHG is an antiseptic cleaner which kills germs and bonds with the skin to continue killing germs even after washing.     Please do not use if you have an allergy to CHG or antibacterial soaps. If your skin becomes reddened/irritated stop using the CHG.  Do not shave (including legs and underarms) for at least  48 hours prior to first CHG shower. It is OK to shave your face.  Please follow these instructions carefully.     Shower the NIGHT BEFORE SURGERY and the MORNING OF SURGERY with CHG Soap.   If you chose to wash your hair, wash your hair first as usual with your normal shampoo. After you shampoo, rinse your hair and body thoroughly to remove the shampoo.  Then [employer] and genitals (private parts) with your normal soap and rinse thoroughly to remove soap.  After that Use CHG Soap as you would any other liquid soap. You can apply CHG directly to the skin and wash gently with a scrungie or a clean washcloth.   Apply the CHG Soap to your body ONLY FROM THE NECK DOWN.  Do not use on open wounds or open sores. Avoid contact with your eyes, ears, mouth and genitals (private parts). Wash Face and genitals (private parts)  with your normal soap.   Wash thoroughly, paying special attention to the area where your surgery will be performed.  Thoroughly rinse your body with warm water from the neck down.  DO NOT shower/wash with your normal soap after using and rinsing off the CHG Soap.  Pat yourself dry with a CLEAN TOWEL.  Wear CLEAN PAJAMAS to bed the night before surgery  Place CLEAN SHEETS on your bed the night before your surgery  DO NOT SLEEP WITH PETS.  Day of Surgery:  Take a shower with CHG soap. Wear Clean/Comfortable clothing the morning of surgery Do not wear lotions, powders, perfumes/colognes, or deodorant.   Remember to brush your teeth WITH YOUR REGULAR TOOTHPASTE. Do not wear jewelry or makeup. DO Not wear nail polish, gel polish, artificial nails, or any other type of covering on natural nails including finger and toenails. If patients have artificial nails, gel coating, etc. that need to be removed by a nail salon please have this removed prior to surgery or surgery may need to be canceled/delayed if the surgeon/ anesthesia feels like the patient is unable to be adequately  monitored. Do not shave 48 hours prior to surgery.  Men may shave face and neck. Do not bring valuables to the hospital. [facility] is not responsible for any belongings or valuables.  Do NOT Smoke (Tobacco/Vaping) or drink Alcohol 24 hours prior to your procedure.  If you use a CPAP at night, you may bring all equipment for your overnight stay.   Contacts, glasses, dentures or bridgework may not be worn into surgery, please bring cases for these belongings.   For patients admitted to the hospital, discharge time will be determined by your treatment team.  Patients discharged the day of surgery will not be allowed to drive home, and someone needs to stay with them for 24 hours.  ONLY 1 SUPPORT PERSON MAY BE PRESENT WHILE YOU ARE IN SURGERY. IF YOU ARE TO BE ADMITTED ONCE YOU ARE IN YOUR ROOM YOU WILL BE ALLOWED TWO (2) VISITORS.  Minor children may have two parents present. Special consideration for safety and communication needs will be reviewed on a case by case basis.     Please read over the following fact sheets that you were given.

## 2020-12-19 ENCOUNTER — Encounter (HOSPITAL_COMMUNITY): Payer: Self-pay | Admitting: Physician Assistant

## 2020-12-19 ENCOUNTER — Ambulatory Visit: Payer: Self-pay

## 2020-12-19 ENCOUNTER — Encounter (HOSPITAL_COMMUNITY): Payer: Self-pay

## 2020-12-19 ENCOUNTER — Other Ambulatory Visit: Payer: Self-pay

## 2020-12-19 ENCOUNTER — Encounter: Payer: Self-pay | Admitting: Oncology

## 2020-12-19 ENCOUNTER — Encounter (HOSPITAL_COMMUNITY)
Admission: RE | Admit: 2020-12-19 | Discharge: 2020-12-19 | Disposition: A | Discharge status: Home / Self Care | Payer: Medicaid Other | Source: Ambulatory Visit | Attending: Surgery | Admitting: Surgery

## 2020-12-19 ENCOUNTER — Ambulatory Visit: Payer: Self-pay | Admitting: Physician Assistant

## 2020-12-19 DIAGNOSIS — Z20822 Contact with and (suspected) exposure to covid-19: Secondary | ICD-10-CM | POA: Diagnosis not present

## 2020-12-19 DIAGNOSIS — Z01812 Encounter for preprocedural laboratory examination: Secondary | ICD-10-CM | POA: Diagnosis present

## 2020-12-19 HISTORY — DX: Abnormal levels of other serum enzymes: R74.8

## 2020-12-19 HISTORY — DX: Hyperglycemia, unspecified: R73.9

## 2020-12-19 HISTORY — DX: Fatty (change of) liver, not elsewhere classified: K76.0

## 2020-12-19 LAB — COMPREHENSIVE METABOLIC PANEL
ALT: 290 U/L — ABNORMAL HIGH (ref 0–44)
AST: 140 U/L — ABNORMAL HIGH (ref 15–41)
Albumin: 3.9 g/dL (ref 3.5–5.0)
Alkaline Phosphatase: 53 U/L (ref 38–126)
Anion gap: 6 (ref 5–15)
BUN: 12 mg/dL (ref 6–20)
CO2: 26 mmol/L (ref 22–32)
Calcium: 9 mg/dL (ref 8.9–10.3)
Chloride: 105 mmol/L (ref 98–111)
Creatinine, Ser: 0.45 mg/dL (ref 0.44–1.00)
GFR, Estimated: >60 mL/min (ref >60)
Glucose, Bld: 280 mg/dL — ABNORMAL HIGH (ref 70–99)
Potassium: 3.6 mmol/L (ref 3.5–5.1)
Sodium: 137 mmol/L (ref 135–145)
Total Bilirubin: 0.7 mg/dL (ref 0.3–1.2)
Total Protein: 7.2 g/dL (ref 6.5–8.1)

## 2020-12-19 LAB — HEMOGLOBIN A1C
Hgb A1c MFr Bld: 7.6 % — ABNORMAL HIGH (ref 4.8–5.6)
Mean Plasma Glucose: 171.42 mg/dL

## 2020-12-19 LAB — CBC WITH DIFFERENTIAL/PLATELET
Abs Immature Granulocytes: 0.01 10*3/uL (ref 0.00–0.07)
Basophils Absolute: 0.1 10*3/uL (ref 0.0–0.1)
Basophils Relative: 1 %
Eosinophils Absolute: 0.3 10*3/uL (ref 0.0–0.5)
Eosinophils Relative: 6 %
HCT: 40.2 % (ref 36.0–46.0)
Hemoglobin: 14 g/dL (ref 12.0–15.0)
Immature Granulocytes: 0 %
Lymphocytes Relative: 28 %
Lymphs Abs: 1.4 10*3/uL (ref 0.7–4.0)
MCH: 29.4 pg (ref 26.0–34.0)
MCHC: 34.8 g/dL (ref 30.0–36.0)
MCV: 84.3 fL (ref 80.0–100.0)
Monocytes Absolute: 0.4 10*3/uL (ref 0.1–1.0)
Monocytes Relative: 8 %
Neutro Abs: 2.9 10*3/uL (ref 1.7–7.7)
Neutrophils Relative %: 57 %
Platelets: 215 10*3/uL (ref 150–400)
RBC: 4.77 MIL/uL (ref 3.87–5.11)
RDW: 13.2 % (ref 11.5–15.5)
WBC: 5.1 10*3/uL (ref 4.0–10.5)
nRBC: 0 % (ref 0.0–0.2)

## 2020-12-19 LAB — SARS CORONAVIRUS 2 (TAT 6-24 HRS): SARS Coronavirus 2: NEGATIVE

## 2020-12-19 NOTE — Progress Notes (Signed)
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  PAT visit was 12/19/2020.  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 Right IJ Port-a-cath 10/18/20.  BMI is consistent with obesity.  Per oncology notes by Dr. Jana Hakim on 12/06/20, last chemotherapy on 12/08/20 as it was held due to worsening elevated LFTs. She received zometa once on 11/01/20 which is to be given every 12 weeks. She also is on goserelin which she has received 3 total injections. Her most recent injection was on 12/01/20. She had an MRI of the liver performed last week which fortunately, did not show any evidence of metastatic disease but it did note some hepatic steatosis. 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 She denies any abdominal pain, vomiting, alcohol use, diarrhea, or constipation. She denies herbal supplement use and tylenol use.  Labs trends show hyperglycemia, although no reported history of DM. A1c added to PAT labs and resulted at 7.6%. LFTs remain elevated with AST 140 and ALT 290 (down from 186 and 339 on 12/06/20). Negative hepatitis panel on 12/06/20. As above  11/30/20 liver MRI showed no evidence of liver mets, but with hepatic steatosis. CBC WNL. I called patient to review results. She felt sick after her first chemotherapy visit, but since then feels back to baseline without any obvious symptoms  related to her elevated LFTs. We discussed her glucose trends since May and that last three readings have been > 200 prompting an A1c which resulted at 7.6% which is concerning for a new diagnosis of DM. She has access to MyChart, and said she would contact her PCP about seeing if she can move up her next visit which is scheduled sometime in August 2022. She says she has been drinking a lot of sodas recently. Dicussed some basic dietary changes to improved glucose control.  While she was on chemotherapy she was taking Decadron, so that may be contributing as well. Will also see if DM Coordinator can see during her admission. Discussed symptoms such as like polyuria, polydipsia, blurred vision, N/V that would warrant more urgent evaluation.   PAT RN called [facility] to report abnormal lab results. I also discussed LFTs and A1c result with anesthesiologist Adele Barthel, MD. If surgeon feels labs acceptable then no new recommendations based on labs alone given recent liver imaging and negative hepatitis panel. She will get a CBG on arrival and anesthesia team evaluation on the day of surgery. I sent Dr. Brantley Stage an update staff message as well.   VS: BP (!) 145/78   Pulse 72   Temp 36.8 C (Oral)   Resp 17   Ht 5' (1.524 m)   Wt 89.7 kg   SpO2 99%   BMI 38.61 kg/m    PROVIDERS: [name], MD is PCP  [phone] (Work) [phone] (Fax) [place], [place]  [phone]  Magrinat, Sarajane Jews, MD is HEM-ONC   LABS: Preoperative labs noted.  See DISCUSSION. (all labs ordered are listed, but only abnormal results are displayed)  Labs Reviewed  COMPREHENSIVE METABOLIC PANEL - Abnormal; Notable for the following components:      Result Value   Glucose, Bld 280 (*)    AST 140 (*)    ALT 290 (*)    All other components within normal limits  HEMOGLOBIN A1C - Abnormal; Notable for the following components:   Hgb A1c MFr Bld 7.6 (*)    All other components within normal limits  SARS  CORONAVIRUS 2 (TAT 6-24 HRS)  CBC WITH DIFFERENTIAL/PLATELET    IMAGES: MRI Liver 11/30/20: IMPRESSION: 1. No evidence for metastatic disease to the liver. No intra or extrahepatic biliary duct dilatation. 2. Hepatic steatosis. 3. Numerous tiny enhancing lesions in the spine are compatible with metastatic disease, better characterized on recent spine MRI of 10/21/2020.  CXR 11/29/20: FINDINGS: Heart size upper normal. Vascularity normal. Lungs are clear without infiltrate effusion or mass. Port-A-Cath tip in the SVC unchanged. No interval change. IMPRESSION: No active cardiopulmonary disease.  Bone Scan 10/13/20: IMPRESSION: 1. Lucent lesions were seen in the sternum and T11 on the CT scan from today. Purely lucent metastases are often not well visualized on bone scans. An MRI was recommended and would be much more sensitive. 2. Uptake near midline of the lower thoracic or upper lumbar vertebral body. The finding is nonspecific but at least somewhat concerning for a metastasis given history and CT of the chest findings. Extending the recommended MRI of the thoracic spine through L1 would be helpful to evaluate this region. 3. No other scintigraphic evidence of bony metastatic disease. 4. Increased uptake in the left breast consistent with known malignancy.   EKG: N/A   CV: Echo 10/13/20: IMPRESSIONS   1. Left ventricular ejection fraction, by estimation, is 60 to 65%. The  left ventricle has normal function. The left ventricle has no regional  wall motion abnormalities. Left ventricular diastolic parameters are  normal. The average left ventricular  global longitudinal strain is -18.6 %. The global longitudinal strain is  low normal.   2. Right ventricular systolic function is normal. The right ventricular  size is normal.   3. The mitral valve is abnormal. Trivial mitral valve regurgitation.   4. The aortic valve is tricuspid. Aortic valve regurgitation is not   visualized.   5. The inferior vena cava is normal in size with greater than 50%  respiratory variability, suggesting right atrial pressure of 3 mmHg.  - Comparison(s): No prior Echocardiogram.    Past Medical History:  Diagnosis Date   Asthma    remote   Breast cancer ([place])    Family history of breast cancer    Family history of ovarian cancer    Family history of prostate cancer    Family history of stomach cancer    Migraines    PCOS (polycystic ovarian syndrome)     Past Surgical History:  Procedure Laterality Date   NO PAST SURGERIES     PORTACATH PLACEMENT Right 10/18/2020   Procedure: INSERTION PORT-A-CATH;  Surgeon: Erroll Luna, MD;  Location: [facility];  Service: General;  Laterality: Right;    MEDICATIONS:  anastrozole (ARIMIDEX) 1 MG tablet   dexamethasone (DECADRON) 4 MG tablet   Goserelin Acetate (ZOLADEX [place])   ibuprofen (ADVIL) 800 MG tablet   lidocaine-prilocaine (EMLA) cream   magic mouthwash SOLN   oxyCODONE (  OXY IR/ROXICODONE) 5 MG immediate release tablet   prochlorperazine (COMPAZINE) 10 MG tablet   UDENYCA 6 MG/0.6ML injection   Vitamin D, Ergocalciferol, (DRISDOL) 50000 UNITS CAPS capsule   No current facility-administered medications for this encounter.   Myra Gianotti, PA-C Surgical Short Stay/Anesthesiology [facility] Phone [phone] [facility] Phone [phone] 12/19/2020 5:41 PM

## 2020-12-19 NOTE — Progress Notes (Signed)
[employer] Dr. Brantley Stage concerning elevated liver enzymes.

## 2020-12-19 NOTE — Anesthesia Preprocedure Evaluation (Deleted)
Anesthesia Evaluation Anesthesia Physical Anesthesia Plan  ASA:   Anesthesia Plan:    Post-op Pain Management:    Induction:   PONV Risk Score and Plan:   Airway Management Planned:   Additional Equipment:   Intra-op Plan:   Post-operative Plan:   Informed Consent:   Plan Discussed with:   Anesthesia Plan Comments: (See PAT note written 12/19/2020 by Myra Gianotti, PA-C. )        Anesthesia Quick Evaluation

## 2020-12-19 NOTE — Progress Notes (Signed)
Spoke with nurse at Dr. Josetta Huddle office concerning elevated liver enzymes and serum glucose. Nurse stated she will pass on info to Dr. Brantley Stage.

## 2020-12-19 NOTE — Progress Notes (Addendum)
PCP - [name], MD w/ [facility]; records requested Cardiologist - Denies Oncologist- Lurline Del, MD  PPM/ICD - Denies  Chest x-ray - N/A EKG - N/A Stress Test - Denies ECHO - Denies Cardiac Cath - Denies  Sleep Study - Denies CPAP - N/A  Pt denies being diabetic.  Blood Thinner Instructions: N/A Aspirin Instructions:N/A  ERAS Protcol - Yes PRE-SURGERY Ensure or G2- Ensure given  COVID TEST- 12/19/20; results pending   Anesthesia review: Yes, elevated liver enzymes, serum glucose.  Patient denies shortness of breath, fever, cough and chest pain at PAT appointment   All instructions explained to the patient, with a verbal understanding of the material. Patient agrees to go over the instructions while at home for a better understanding. Patient also instructed to self quarantine after being tested for COVID-19. The opportunity to ask questions was provided.

## 2020-12-20 ENCOUNTER — Ambulatory Visit (HOSPITAL_COMMUNITY): Payer: Medicaid Other

## 2020-12-20 ENCOUNTER — Ambulatory Visit (HOSPITAL_COMMUNITY)
Admission: RE | Admit: 2020-12-20 | Discharge: 2020-12-20 | Disposition: A | Discharge status: Home / Self Care | Payer: Medicaid Other | Attending: Surgery | Admitting: Surgery

## 2020-12-20 ENCOUNTER — Encounter (HOSPITAL_COMMUNITY)
Admission: RE | Disposition: A | Discharge status: Home / Self Care | Payer: Self-pay | Source: Home / Self Care | Attending: Surgery

## 2020-12-20 ENCOUNTER — Encounter (HOSPITAL_COMMUNITY): Payer: Self-pay | Admitting: Surgery

## 2020-12-20 DIAGNOSIS — Z538 Procedure and treatment not carried out for other reasons: Secondary | ICD-10-CM | POA: Diagnosis not present

## 2020-12-20 DIAGNOSIS — C50912 Malignant neoplasm of unspecified site of left female breast: Secondary | ICD-10-CM | POA: Diagnosis present

## 2020-12-20 LAB — GLUCOSE, CAPILLARY: Glucose-Capillary: 394 mg/dL — ABNORMAL HIGH (ref 70–99)

## 2020-12-20 SURGERY — MODIFIED MASTECTOMY
Anesthesia: General | Site: Breast | Laterality: Right

## 2020-12-20 MED ORDER — CHLORHEXIDINE GLUCONATE 0.12 % MT SOLN: 15.0000 mL | Freq: Once | OROMUCOSAL | Status: DC

## 2020-12-20 MED ORDER — CHLORHEXIDINE GLUCONATE CLOTH 2 % EX PADS: 6.0000 | MEDICATED_PAD | Freq: Once | CUTANEOUS | Status: DC

## 2020-12-20 MED ORDER — CEFAZOLIN IN SODIUM CHLORIDE 3-0.9 GM/100ML-% IV SOLN: 3.0000 g | INTRAVENOUS | Status: DC

## 2020-12-20 MED ORDER — ORAL CARE MOUTH RINSE: 15.0000 mL | Freq: Once | OROMUCOSAL | Status: DC

## 2020-12-20 MED ORDER — LACTATED RINGERS IV SOLN: INTRAVENOUS | Status: DC

## 2020-12-20 NOTE — Progress Notes (Signed)
Dr Brantley Stage notified of pt's CBG of 394 in short stay. Dr Brantley Stage decided to cancel surgery and have pt f/u with PCP. Pt verbalized understanding to follow up with PCP. Pt discharged home with friend.

## 2020-12-20 NOTE — Progress Notes (Signed)
Pt arrived to short stay with a CBG of 394. Pt does not take any medications for diabetes. Pt drank pre-op Ensure this morning prior to arrival. Completed at [facility]. Dr. Fransisco Beau notified. Will contact Dr. Brantley Stage for further instructions.

## 2020-12-21 ENCOUNTER — Ambulatory Visit: Payer: Self-pay

## 2020-12-25 ENCOUNTER — Encounter: Payer: Self-pay | Admitting: *Deleted

## 2020-12-28 ENCOUNTER — Other Ambulatory Visit: Payer: Self-pay

## 2020-12-28 ENCOUNTER — Other Ambulatory Visit: Payer: Self-pay | Admitting: Oncology

## 2020-12-28 ENCOUNTER — Inpatient Hospital Stay: Payer: Medicaid Other

## 2020-12-28 ENCOUNTER — Encounter: Payer: Self-pay | Admitting: *Deleted

## 2020-12-28 ENCOUNTER — Inpatient Hospital Stay (HOSPITAL_BASED_OUTPATIENT_CLINIC_OR_DEPARTMENT_OTHER): Payer: Medicaid Other | Admitting: Oncology

## 2020-12-28 VITALS — BP 121/77 | HR 75 | Temp 97.9°F | Resp 20 | Wt 194.6 lb

## 2020-12-28 DIAGNOSIS — Z7189 Other specified counseling: Secondary | ICD-10-CM | POA: Diagnosis not present

## 2020-12-28 DIAGNOSIS — C7951 Secondary malignant neoplasm of bone: Secondary | ICD-10-CM | POA: Insufficient documentation

## 2020-12-28 DIAGNOSIS — Z170 Estrogen receptor positive status [ER+]: Secondary | ICD-10-CM

## 2020-12-28 DIAGNOSIS — C50412 Malignant neoplasm of upper-outer quadrant of left female breast: Secondary | ICD-10-CM

## 2020-12-28 DIAGNOSIS — Z95828 Presence of other vascular implants and grafts: Secondary | ICD-10-CM

## 2020-12-28 DIAGNOSIS — R7401 Elevation of levels of liver transaminase levels: Secondary | ICD-10-CM

## 2020-12-28 DIAGNOSIS — Z5111 Encounter for antineoplastic chemotherapy: Secondary | ICD-10-CM | POA: Diagnosis not present

## 2020-12-28 LAB — CBC WITH DIFFERENTIAL/PLATELET
Abs Immature Granulocytes: 0 10*3/uL (ref 0.00–0.07)
Basophils Absolute: 0 10*3/uL (ref 0.0–0.1)
Basophils Relative: 0 %
Eosinophils Absolute: 0.2 10*3/uL (ref 0.0–0.5)
Eosinophils Relative: 4 %
HCT: 40.4 % (ref 36.0–46.0)
Hemoglobin: 14 g/dL (ref 12.0–15.0)
Immature Granulocytes: 0 %
Lymphocytes Relative: 36 %
Lymphs Abs: 1.7 10*3/uL (ref 0.7–4.0)
MCH: 28.9 pg (ref 26.0–34.0)
MCHC: 34.7 g/dL (ref 30.0–36.0)
MCV: 83.3 fL (ref 80.0–100.0)
Monocytes Absolute: 0.4 10*3/uL (ref 0.1–1.0)
Monocytes Relative: 8 %
Neutro Abs: 2.4 10*3/uL (ref 1.7–7.7)
Neutrophils Relative %: 52 %
Platelets: 272 10*3/uL (ref 150–400)
RBC: 4.85 MIL/uL (ref 3.87–5.11)
RDW: 12.6 % (ref 11.5–15.5)
WBC: 4.6 10*3/uL (ref 4.0–10.5)
nRBC: 0 % (ref 0.0–0.2)

## 2020-12-28 LAB — COMPREHENSIVE METABOLIC PANEL
ALT: 515 U/L (ref 0–44)
AST: 251 U/L (ref 15–41)
Albumin: 4.3 g/dL (ref 3.5–5.0)
Alkaline Phosphatase: 58 U/L (ref 38–126)
Anion gap: 10 (ref 5–15)
BUN: 13 mg/dL (ref 6–20)
CO2: 24 mmol/L (ref 22–32)
Calcium: 10 mg/dL (ref 8.9–10.3)
Chloride: 106 mmol/L (ref 98–111)
Creatinine, Ser: 0.63 mg/dL (ref 0.44–1.00)
GFR, Estimated: >60 mL/min (ref >60)
Glucose, Bld: 132 mg/dL — ABNORMAL HIGH (ref 70–99)
Potassium: 3.8 mmol/L (ref 3.5–5.1)
Sodium: 140 mmol/L (ref 135–145)
Total Bilirubin: 0.7 mg/dL (ref 0.3–1.2)
Total Protein: 8 g/dL (ref 6.5–8.1)

## 2020-12-28 MED ORDER — VENLAFAXINE HCL ER 37.5 MG PO CP24: 37.5000 mg | ORAL_CAPSULE | Freq: Every day | ORAL | 4 refills | Status: DC

## 2020-12-28 MED ORDER — GOSERELIN ACETATE 3.6 MG ~~LOC~~ IMPL
DRUG_IMPLANT | SUBCUTANEOUS | Status: AC
  Filled 2020-12-28: qty 3.6

## 2020-12-28 MED ORDER — GOSERELIN ACETATE 3.6 MG ~~LOC~~ IMPL
3.6000 mg | DRUG_IMPLANT | Freq: Once | SUBCUTANEOUS | Status: AC
  Administered 2020-12-28: 3.6 mg via SUBCUTANEOUS

## 2020-12-28 NOTE — Patient Instructions (Signed)
Goserelin injection What is this medication? GOSERELIN (GOE se rel in) is similar to a hormone found in the body. It lowers the amount of sex hormones that the body makes. Men will have lower testosterone levels and women will have lower estrogen levels while taking this medicine. In men, this medicine is used to treat prostate cancer; the injection is either given once per month or once every 12 weeks. A once per month injection (only) is used to treat women with endometriosis, dysfunctional uterine bleeding, or advanced breast cancer. This medicine may be used for other purposes; ask your health care provider or pharmacist if you have questions. COMMON BRAND NAME(S): Zoladex What should I tell my care team before I take this medication? They need to know if you have any of these conditions: bone problems diabetes heart disease history of irregular heartbeat an unusual or allergic reaction to goserelin, other medicines, foods, dyes, or preservatives pregnant or trying to get pregnant breast-feeding How should I use this medication? This medicine is for injection under the skin. It is given by a health care professional in a hospital or clinic setting. Talk to your pediatrician regarding the use of this medicine in children. Special care may be needed. Overdosage: If you think you have taken too much of this medicine contact a poison control center or emergency room at once. NOTE: This medicine is only for you. Do not share this medicine with others. What if I miss a dose? It is important not to miss your dose. Call your doctor or health care professional if you are unable to keep an appointment. What may interact with this medication? Do not take this medicine with any of the following medications: cisapride dronedarone pimozide thioridazine This medicine may also interact with the following medications: other medicines that prolong the QT interval (an abnormal heart rhythm) This list  may not describe all possible interactions. Give your health care provider a list of all the medicines, herbs, non-prescription drugs, or dietary supplements you use. Also tell them if you smoke, drink alcohol, or use illegal drugs. Some items may interact with your medicine. What should I watch for while using this medication? Visit your doctor or health care provider for regular checks on your progress. Your symptoms may appear to get worse during the first weeks of this therapy. Tell your doctor or healthcare provider if your symptoms do not start to get better or if they get worse after this time. Your bones may get weaker if you take this medicine for a long time. If you smoke or frequently drink alcohol you may increase your risk of bone loss. A family history of osteoporosis, chronic use of drugs for seizures (convulsions), or corticosteroids can also increase your risk of bone loss. Talk to your doctor about how to keep your bones strong. This medicine should stop regular monthly menstruation in women. Tell your doctor if you continue to menstruate. Women should not become pregnant while taking this medicine or for 12 weeks after stopping this medicine. Women should inform their doctor if they wish to become pregnant or think they might be pregnant. There is a potential for serious side effects to an unborn child. Talk to your health care professional or pharmacist for more information. Do not breast-feed an infant while taking this medicine. Men should inform their doctors if they wish to father a child. This medicine may lower sperm counts. Talk to your health care professional or pharmacist for more information. This medicine may   increase blood sugar. Ask your healthcare provider if changes in diet or medicines are needed if you have diabetes. What side effects may I notice from receiving this medication? Side effects that you should report to your doctor or health care professional as soon as  possible: allergic reactions like skin rash, itching or hives, swelling of the face, lips, or tongue bone pain breathing problems changes in vision chest pain feeling faint or lightheaded, falls fever, chills pain, swelling, warmth in the leg pain, tingling, numbness in the hands or feet signs and symptoms of high blood sugar such as being more thirsty or hungry or having to urinate more than normal. You may also feel very tired or have blurry vision signs and symptoms of low blood pressure like dizziness; feeling faint or lightheaded, falls; unusually weak or tired stomach pain swelling of the ankles, feet, hands trouble passing urine or change in the amount of urine unusually high or low blood pressure unusually weak or tired Side effects that usually do not require medical attention (report to your doctor or health care professional if they continue or are bothersome): change in sex drive or performance changes in breast size in both males and females changes in emotions or moods headache hot flashes irritation at site where injected loss of appetite skin problems like acne, dry skin vaginal dryness This list may not describe all possible side effects. Call your doctor for medical advice about side effects. You may report side effects to FDA at 1-800-FDA-1088. Where should I keep my medication? This drug is given in a hospital or clinic and will not be stored at home. NOTE: This sheet is a summary. It may not cover all possible information. If you have questions about this medicine, talk to your doctor, pharmacist, or health care provider.  2022 Elsevier/Gold Standard (2018-09-07 14:05:56)  

## 2020-12-28 NOTE — Progress Notes (Unsigned)
Addendum: Gabriela Sullivan's transaminases continue to be very elevated, with a normal protein, albumin, and alkaline phosphatase.  We obtained a liver MRI which showed significant steatosis but no evidence of cancer.  An acute hepatitis panel 12/06/2020 was negative.  We have stopped her chemotherapy for this reason but that does not seem to have helped.  She does not seem to be on any medication that would cause this.  I called her and discussed with her.  She denies any supplements or taking any other medications except what we have prescribed.  She is agreeable to GI referral.

## 2020-12-28 NOTE — Progress Notes (Signed)
[place] OFFICE PROGRESS NOTE  [name], MD [place] [address]  DIAGNOSIS: Inflammatory estrogen receptor positive breast cancer  CURRENT THERAPY: Goserelin, anastrozole; definitive surgery pending  INTERVAL HISTORY: Gabriela Sullivan was scheduled for surgery last week but she was found to have a very high blood sugar.  The surgery was postponed.  She is going to be seeing her primary care physician to bring the sugars under better control and hopefully she can have her surgery sometime in August.  She is tolerating Zoladex well.  The injection is not painful or problematic.  Between that and anastrozole she does have significant hot flashes, "all the time", and they are bothersome to her.  Vaginal dryness is not a major issue.    Review of systems: A detailed review of systems today was otherwise noncontributory  HISTORY OF CURRENT ILLNESS: From the original intake note:   Gabriela Sullivan noted a marble sized lump in her left breast in 2020.  She ignored it.  Around September 2021 this mass was "golf sized" and she brought it to her mother's attention.  She did not bring it to medical attention until several months later and she finally underwent bilateral diagnostic mammography with tomography and left breast ultrasonography at [facility] on 09/12/2020 showing: breast density category C; 5.1 cm ill-defined outer left breast mass corresponding to patient's palpable abnormality; at least 4 abnormal left axillary lymph nodes with cortical thickening; indeterminate 0.8 cm group of outer right breast calcifications; indeterminate 6 cm upper-outer left breast calcifications.   Accordingly on 09/26/2020 she proceeded to biopsy of the bilateral breast areas in question. The pathology from this procedure ([id number]) showed: 1. Left Breast, upper-outer             - invasive ductal carcinoma, grade 2.             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 Proliferation marker Ki67 at 10%. HER2 equivocal by immunohistochemistry (2+), but negative by fluorescent in situ hybridization with a signals ratio 1.61 and number per cell 2.65.   2. Right Breast, upper-outer             - low grade ductal carcinoma in situ             - Prognostic indicators significant for: estrogen receptor, >95% positive and progesterone receptor, >95% positive, both with strong staining intensity.   She underwent additional biopsies on 09/26/2020. Pathology from the procedure ([id number]) showed: 1. Left Breast, 3 o'clock             - invasive ductal carcinoma, grade 2             - ductal carcinoma in situ             - Prognostic indicators significant for: estrogen receptor, 60% positive with moderate staining intensity and progesterone receptor, 10% positive with strong staining intensity. Proliferation marker Ki67 at 15%. HER2 equivocal by immunohistochemistry (2+), but negative by fluorescent in situ hybridization with a signals ratio 1.66 and number per cell 2.65. 2. 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     MEDICAL HISTORY: Past Medical History:  Diagnosis Date   Asthma    remote   Breast cancer ([place])    Elevated liver enzymes    Family history of breast cancer    Family history of ovarian cancer    Family history of prostate cancer    Family history of stomach cancer    Hepatic steatosis    Hyperglycemia    A1c 7.6% 12/19/20   Migraines    PCOS (polycystic ovarian syndrome)     ALLERGIES:  has No Known Allergies.  MEDICATIONS:  Current Outpatient Medications  Medication Sig Dispense Refill   anastrozole (ARIMIDEX) 1 MG tablet Take 1 tablet (1 mg total) by mouth daily. (Patient taking differently: Take 1 mg by mouth every evening.) 30 tablet 2   Goserelin Acetate (ZOLADEX [place]) Inject 1 Dose into the skin every 14 (fourteen) days.     ibuprofen (ADVIL) 800 MG tablet Take 1 tablet (800 mg total) by mouth every 8 (eight) hours as needed. (Patient not taking: Reported on 12/13/2020) 30 tablet 0   magic mouthwash SOLN Take 5 mLs by mouth 4 (four) times daily as needed for mouth pain. 240 mL 0   oxyCODONE (OXY IR/ROXICODONE) 5 MG immediate release tablet Take 1 tablet (5 mg total) by mouth every 6 (six) hours as needed for severe pain. (Patient not taking: Reported on 12/13/2020) 15 tablet 0   UDENYCA 6 MG/0.6ML injection Inject 6 mg into the skin every 14 (fourteen) days.     Vitamin D, Ergocalciferol, (DRISDOL) 50000 UNITS CAPS capsule Take 50,000 Units by mouth every Monday.     No current facility-administered medications for this visit.    SURGICAL HISTORY:  Past Surgical History:  Procedure Laterality Date   NO PAST SURGERIES     PORTACATH PLACEMENT Right 10/18/2020   Procedure: INSERTION PORT-A-CATH;  Surgeon: Erroll Luna, MD;  Location: [facility];  Service: General;  Laterality: Right;     PHYSICAL EXAMINATION:  There were no vitals taken for this visit.  ECOG PERFORMANCE STATUS: 1  Physical Exam  Constitutional: Oriented to person,  place, and time and well-developed, well-nourished, and in no distress.  HENT:  Head: Normocephalic and atraumatic.  Mouth/Throat: Oropharynx is clear and moist. No oropharyngeal exudate.  Eyes: Conjunctivae are normal. Right eye exhibits no discharge. Left eye exhibits no discharge. No scleral icterus.  Neck: Normal range of motion. Neck supple.  Cardiovascular: Normal rate, regular rhythm, normal heart sounds and intact distal pulses.   Pulmonary/Chest: Effort normal and breath sounds normal. No respiratory distress. No wheezes. No rales.  Abdominal: Soft. Bowel sounds are normal. Exhibits no distension and no mass. There is no tenderness.  Musculoskeletal: Normal range of motion. Exhibits no edema.  Lymphadenopathy:    No cervical adenopathy.  Neurological: Alert and oriented to person, place, and time. Exhibits normal muscle tone. Gait normal. Coordination normal.  Skin: Skin is  warm and dry. No rash noted. Not diaphoretic. No erythema. No pallor.  Psychiatric: Mood, memory and judgment normal.  Vitals reviewed.  LABORATORY DATA: Lab Results  Component Value Date   WBC 5.1 12/19/2020   HGB 14.0 12/19/2020   HCT 40.2 12/19/2020   MCV 84.3 12/19/2020   PLT 215 12/19/2020      Chemistry      Component Value Date/Time   NA 137 12/19/2020 0822   K 3.6 12/19/2020 0822   CL 105 12/19/2020 0822   CO2 26 12/19/2020 0822   BUN 12 12/19/2020 0822   CREATININE 0.45 12/19/2020 0822   CREATININE 0.64 10/04/2020 0755      Component Value Date/Time   CALCIUM 9.0 12/19/2020 0822   ALKPHOS 53 12/19/2020 0822   AST 140 (H) 12/19/2020 0822   AST 47 (H) 10/04/2020 0755   ALT 290 (H) 12/19/2020 0822   ALT 108 (H) 10/04/2020 0755   BILITOT 0.7 12/19/2020 0822   BILITOT 0.7 10/04/2020 0755       RADIOGRAPHIC STUDIES:  DG Chest 2 View  Result Date: 11/29/2020 CLINICAL DATA:  Inflammatory breast cancer new diagnosis EXAM: CHEST - 2 VIEW COMPARISON:  10/18/2020 FINDINGS: Heart size  upper normal. Vascularity normal. Lungs are clear without infiltrate effusion or mass. Port-A-Cath tip in the SVC unchanged. No interval change. IMPRESSION: No active cardiopulmonary disease. Electronically Signed   By: Franchot Gallo M.D.   On: 11/29/2020 09:50   MR LIVER W WO CONTRAST  Result Date: 12/01/2020 CLINICAL DATA:  Screening for liver mets. Inflammatory breast cancer with increasing LFTs. EXAM: MRI ABDOMEN WITHOUT AND WITH CONTRAST TECHNIQUE: Multiplanar multisequence MR imaging of the abdomen was performed both before and after the administration of intravenous contrast. CONTRAST:  93m GADAVIST GADOBUTROL 1 MMOL/ML IV SOLN COMPARISON:  None. FINDINGS: Lower chest: Unremarkable. Hepatobiliary: Diffuse loss of signal intensity in the liver parenchyma on out of phase T1 imaging is compatible with fatty deposition. No suspicious enhancing liver lesion to suggest metastatic disease. No intrahepatic or extrahepatic biliary dilation. Gallbladder is nondistended. Pancreas: No focal mass lesion. No dilatation of the main duct. No intraparenchymal cyst. No peripancreatic edema. Spleen:  No splenomegaly. No focal mass lesion. Adrenals/Urinary Tract: No adrenal nodule or mass. Tiny T2 hyperintensity in the upper pole right kidney is too small to characterize but likely a cyst. Left kidney unremarkable. Stomach/Bowel: Stomach is unremarkable. No gastric wall thickening. No evidence of outlet obstruction. Duodenum is normally positioned as is the ligament of Treitz. No small bowel wall thickening. No small bowel dilatation. Vascular/Lymphatic: No abdominal aortic aneurysm. There is no gastrohepatic or hepatoduodenal ligament lymphadenopathy. No retroperitoneal or mesenteric lymphadenopathy. Other:  No intraperitoneal free fluid. Musculoskeletal: Numerous tiny enhancing lesions in the spine are compatible with metastatic disease, better characterized on recent spine MRI of 10/21/2020. IMPRESSION: 1. No evidence  for metastatic disease to the liver. No intra or extrahepatic biliary duct dilatation. 2. Hepatic steatosis. 3. Numerous tiny enhancing lesions in the spine are compatible with metastatic disease, better characterized on recent spine MRI of 10/21/2020. Electronically Signed   By: EMisty StanleyM.D.   On: 12/01/2020 08:20     ASSESSMENT/PLAN:  ELIGIBLE FOR AVAILABLE RESEARCH PROTOCOL:   ASSESSMENT: 30y.o. [place] 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 as follows:             (0) status post left breast biopsies x2 and left axilla lymph node biopsy 09/26/2020 for a clinical T3 N1(f),  stage IIa invasive ductal carcinoma, grade 2, estrogen and progesterone receptor positive, HER2 not amplified, with an MIB-1 in the 10-15% range             (a) right breast biopsy 09/25/2020 showed ductal carcinoma in situ, grade 1, estrogen and progesterone receptor positive             (b) breast MRI 10/06/2020 shows a 11 cm area of malignancy, with a second 1.9 cm mass, and skin thickening concerning for inflammatory breast carcinoma; at least 6 abnormal lymph nodes noted             (c) chest CT scan 10/13/2020 shows clear lungs and no obvious liver involvement but multiple lytic bone lesions in addition to the findings and MRI above             (d) bone scan 10/13/2020 shows nonspecific findings (lytic lesions not expected to be "hot")             (e) total spinal MRI 10/21/2020 shows 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  No cervical vertebral metastases noted.  There is also degenerative disc disease.   (1) genetics testing 11/25/2020 through the [employer] panel found no deleterious mutations in AIP, ALK, APC, ATM, AXIN2, BAP1, BARD1, BLM, BMPR1A, BRCA1, BRCA2, BRIP1,  CDC73, CDH1, CDK4, CDKN1B, CDKN2A, CHEK2, CTNNA1, DICER1, FANCC, FH, FLCN, GALNT12, KIF1B, LZTR1, MAX, MEN1, MET, MLH1, MSH2, MSH3, MSH6, MUTYH, NBN, NF1, NF2, NTHL1, PALB2, PHOX2B, PMS2, POT1, PRKAR1A, PTCH1, PTEN, RAD51C, RAD51D, RB1, RECQL, RET, SDHA, SDHAF2, SDHB, SDHC, SDHD, SMAD4, SMARCA4, SMARCB1, SMARCE1, STK11, SUFU, TMEM127, TP53, TSC1, TSC2, VHL and XRCC2 (sequencing and deletion/duplication); EGFR, EGLN1, HOXB13, KIT, MITF, PDGFRA, POLD1 and POLE (sequencing only); EPCAM and GREM1 (deletion/duplication only). RNA data is routinely analyzed for use in variant interpretation for all genes.   (2) neoadjuvant chemotherapy consisting of cyclophosphamide and doxorubicin in dose dense fashion x4 starting 10/24/2020, possibly followed by paclitaxel weekly x12             (a) echo 10/13/2020 showed an ejection fraction in the 60-65% range  (b) chemotherapy discontinued after two cycles of CA due to rise in LFTs  (c) liver MRI 11/30/2020 shows no metastatic disease, significant hepatic steatosis   (3) definitive surgery pending   (4) adjuvant radiation to the left side to follow   (5) anastrozole started 12/06/2020             (a) goserelin started 10/11/2020   (6) zolendronate started 11/02/2018. Every 12 weeks.    PLAN: Gabriela Sullivan is tolerating the goserelin/anastrozole combination generally well.  I think we can improve on the hot flashes.  We are starting venlafaxine at 37.5 mg daily.  She understands this is a very low dose and if she does not get significant relief within a couple of weeks we will increase that to 75 mg.  She knows that she needs to be on any good diet and exercise program as well as stronger medication for her diabetes.  Hopefully she will be able to accomplish this within the next couple of weeks or so so she can proceed to her definitive surgery.  She will see [place] again in 28 days with her next Zoladex dose.  She knows to call for any other issue that may develop before  then  Total  encounter time 25 minutes.Chauncey Cruel, MD Medical Oncology and Hematology [facility] [address] [place], [place] [address] Tel. [phone]    Fax. [phone]

## 2021-01-01 ENCOUNTER — Telehealth: Payer: Self-pay | Admitting: Oncology

## 2021-01-01 NOTE — Telephone Encounter (Signed)
Scheduled appointment per 07/28 los. Patient is aware. 

## 2021-01-03 ENCOUNTER — Other Ambulatory Visit: Payer: Self-pay | Admitting: Oncology

## 2021-01-03 ENCOUNTER — Ambulatory Visit: Payer: Self-pay

## 2021-01-04 ENCOUNTER — Encounter: Payer: Self-pay | Admitting: *Deleted

## 2021-01-04 ENCOUNTER — Telehealth: Payer: Self-pay | Admitting: *Deleted

## 2021-01-04 NOTE — Telephone Encounter (Signed)
Pt arrived at pre-op on 7/20, CBG 394- per Dr. Brantley Stage, sx cx and f/u with pcp recommended. Sx has been r/s to 8/31. Appt with Mendel Ryder and injection to be r/s to 9/9. Scheduling msg sent

## 2021-01-05 ENCOUNTER — Telehealth: Payer: Self-pay | Admitting: Adult Health

## 2021-01-05 NOTE — Telephone Encounter (Signed)
R/s appts per 8/4 sch msg. Pt aware.

## 2021-01-24 ENCOUNTER — Ambulatory Visit: Payer: Self-pay | Admitting: Surgery

## 2021-01-24 NOTE — Pre-Procedure Instructions (Signed)
Surgical Instructions   Your procedure is scheduled on Wednesday, August 31st. Report to [facility] Main Entrance "A" at 12:30 P.M., then check in with the Admitting office. Call this number if you have problems the morning of surgery: [phone]   If you have any questions prior to your surgery date call [phone]: Open Monday-Friday 8am-4pm   Remember: Do not eat after midnight the night before your surgery  You may drink clear liquids until 11:30 AM the morning of your surgery.   Clear liquids allowed are: Water, Non-Citrus Juices (without pulp), Carbonated Beverages, Clear Tea, Black Coffee with (NO MILK, CREAM OR POWDERED CREAMER), and Gatorade   Take these medicines the morning of surgery with A SIP OF WATER  anastrozole (ARIMIDEX) venlafaxine XR (EFFEXOR-XR)   WHAT DO I DO ABOUT MY DIABETES MEDICATION?   Take usual dose of metFORMIN (GLUCOPHAGE-XR) on 8/30. DO NOT take metFORMIN (GLUCOPHAGE-XR)  the morning of surgery (8/31)    HOW TO MANAGE YOUR DIABETES BEFORE AND AFTER SURGERY  Why is it important to control my blood sugar before and after surgery? Improving blood sugar levels before and after surgery helps healing and can limit problems. A way of improving blood sugar control is eating a healthy diet by:  Eating less sugar and carbohydrates  Increasing activity/exercise  Talking with your doctor about reaching your blood sugar goals High blood sugars (greater than 180 mg/dL) can raise your risk of infections and slow your recovery, so you will need to focus on controlling your diabetes during the weeks before surgery. Make sure that the doctor who takes care of your diabetes knows about your planned surgery including the date and location.  How do I manage my blood sugar before surgery? Check your blood sugar at least 4 times a day, starting 2 days before surgery, to make sure that the level is not too high or low.  Check your blood sugar the morning of your  surgery when you wake up and every 2 hours until you get to the Short Stay unit.  If your blood sugar is less than 70 mg/dL, you will need to treat for low blood sugar: Do not take insulin. Treat a low blood sugar (less than 70 mg/dL) with  cup of clear juice (cranberry or apple), 4 glucose tablets, OR glucose gel. Recheck blood sugar in 15 minutes after treatment (to make sure it is greater than 70 mg/dL). If your blood sugar is not greater than 70 mg/dL on recheck, call [phone] for further instructions. Report your blood sugar to the short stay nurse when you get to Short Stay.  If you are admitted to the hospital after surgery: Your blood sugar will be checked by the staff and you will probably be given insulin after surgery (instead of oral diabetes medicines) to make sure you have good blood sugar levels. The goal for blood sugar control after surgery is 80-180 mg/dL.   As of today, STOP taking any Aspirin (unless otherwise instructed by your surgeon) Aleve, Naproxen, Ibuprofen, Motrin, Advil, Goody's, BC's, all herbal medications, fish oil, and all vitamins.          Do not wear jewelry or makeup Do not wear lotions, powders, perfumes, or deodorant. Do not shave 48 hours prior to surgery.   Do not bring valuables to the hospital. [facility] is not responsible for any belongings or valuables. Do not wear nail polish, gel polish, artificial nails, or any other type of covering on natural nails including  finger and toenails. If patients have artificial nails, gel coating, etc. that need to be removed by a nail salon please have this removed prior to surgery or surgery may need to be canceled/delayed if the surgeon/ anesthesia feels like the patient is unable to be adequately monitored.               Do NOT Smoke (Tobacco/Vaping) or drink Alcohol 24 hours prior to your procedure If you use a CPAP at night, you may bring all equipment for your overnight stay.   Contacts, glasses,  dentures or bridgework may not be worn into surgery, please bring cases for these belongings   For patients admitted to the hospital, discharge time will be determined by your treatment team.   Patients discharged the day of surgery will not be allowed to drive home, and someone needs to stay with them for 24 hours.  ONLY 1 SUPPORT PERSON MAY BE PRESENT WHILE YOU ARE IN SURGERY. IF YOU ARE TO BE ADMITTED ONCE YOU ARE IN YOUR ROOM YOU WILL BE ALLOWED TWO (2) VISITORS.  Minor children may have two parents present. Special consideration for safety and communication needs will be reviewed on a case by case basis.  Special instructions:  Oral Hygiene is also important to reduce your risk of infection.  Remember - BRUSH YOUR TEETH THE MORNING OF SURGERY WITH YOUR REGULAR TOOTHPASTE   [facility]- Preparing For Surgery  Before surgery, you can play an important role. Because skin is not sterile, your skin needs to be as free of germs as possible. You can reduce the number of germs on your skin by washing with CHG (chlorahexidine gluconate) Soap before surgery.  CHG is an antiseptic cleaner which kills germs and bonds with the skin to continue killing germs even after washing.     Please do not use if you have an allergy to CHG or antibacterial soaps. If your skin becomes reddened/irritated stop using the CHG.  Do not shave (including legs and underarms) for at least 48 hours prior to first CHG shower. It is OK to shave your face.  Please follow these instructions carefully.     Shower the NIGHT BEFORE SURGERY and the MORNING OF SURGERY with CHG Soap.   If you chose to wash your hair, wash your hair first as usual with your normal shampoo. After you shampoo, rinse your hair and body thoroughly to remove the shampoo.  Then [employer] and genitals (private parts) with your normal soap and rinse thoroughly to remove soap.  After that Use CHG Soap as you would any other liquid soap. You can apply CHG  directly to the skin and wash gently with a scrungie or a clean washcloth.   Apply the CHG Soap to your body ONLY FROM THE NECK DOWN.  Do not use on open wounds or open sores. Avoid contact with your eyes, ears, mouth and genitals (private parts). Wash Face and genitals (private parts)  with your normal soap.   Wash thoroughly, paying special attention to the area where your surgery will be performed.  Thoroughly rinse your body with warm water from the neck down.  DO NOT shower/wash with your normal soap after using and rinsing off the CHG Soap.  Pat yourself dry with a CLEAN TOWEL.  Wear CLEAN PAJAMAS to bed the night before surgery  Place CLEAN SHEETS on your bed the night before your surgery  DO NOT SLEEP WITH PETS.   Day of Surgery:  Take  a shower with CHG soap. Wear Clean/Comfortable clothing the morning of surgery Do not apply any deodorants/lotions.   Remember to brush your teeth WITH YOUR REGULAR TOOTHPASTE.   Please read over the following fact sheets that you were given.

## 2021-01-25 ENCOUNTER — Ambulatory Visit: Payer: Self-pay | Admitting: Surgery

## 2021-01-25 ENCOUNTER — Other Ambulatory Visit: Payer: Self-pay

## 2021-01-25 ENCOUNTER — Encounter (HOSPITAL_COMMUNITY): Payer: Self-pay

## 2021-01-25 ENCOUNTER — Encounter (HOSPITAL_COMMUNITY)
Admission: RE | Admit: 2021-01-25 | Discharge: 2021-01-25 | Disposition: A | Discharge status: Home / Self Care | Payer: Medicaid Other | Source: Ambulatory Visit | Attending: Surgery | Admitting: Surgery

## 2021-01-25 DIAGNOSIS — E669 Obesity, unspecified: Secondary | ICD-10-CM | POA: Diagnosis not present

## 2021-01-25 DIAGNOSIS — E118 Type 2 diabetes mellitus with unspecified complications: Secondary | ICD-10-CM | POA: Diagnosis not present

## 2021-01-25 DIAGNOSIS — Z7984 Long term (current) use of oral hypoglycemic drugs: Secondary | ICD-10-CM | POA: Diagnosis not present

## 2021-01-25 DIAGNOSIS — Z6837 Body mass index (BMI) 37.0-37.9, adult: Secondary | ICD-10-CM | POA: Insufficient documentation

## 2021-01-25 DIAGNOSIS — Z01818 Encounter for other preprocedural examination: Secondary | ICD-10-CM | POA: Diagnosis not present

## 2021-01-25 DIAGNOSIS — C50912 Malignant neoplasm of unspecified site of left female breast: Secondary | ICD-10-CM | POA: Diagnosis not present

## 2021-01-25 HISTORY — DX: Prediabetes: R73.03

## 2021-01-25 LAB — CBC WITH DIFFERENTIAL/PLATELET
Abs Immature Granulocytes: 0.01 10*3/uL (ref 0.00–0.07)
Basophils Absolute: 0 10*3/uL (ref 0.0–0.1)
Basophils Relative: 0 %
Eosinophils Absolute: 0.1 10*3/uL (ref 0.0–0.5)
Eosinophils Relative: 2 %
HCT: 41.2 % (ref 36.0–46.0)
Hemoglobin: 14.3 g/dL (ref 12.0–15.0)
Immature Granulocytes: 0 %
Lymphocytes Relative: 42 %
Lymphs Abs: 2 10*3/uL (ref 0.7–4.0)
MCH: 28.8 pg (ref 26.0–34.0)
MCHC: 34.7 g/dL (ref 30.0–36.0)
MCV: 83.1 fL (ref 80.0–100.0)
Monocytes Absolute: 0.3 10*3/uL (ref 0.1–1.0)
Monocytes Relative: 6 %
Neutro Abs: 2.4 10*3/uL (ref 1.7–7.7)
Neutrophils Relative %: 50 %
Platelets: 324 10*3/uL (ref 150–400)
RBC: 4.96 MIL/uL (ref 3.87–5.11)
RDW: 11.9 % (ref 11.5–15.5)
WBC: 4.8 10*3/uL (ref 4.0–10.5)
nRBC: 0 % (ref 0.0–0.2)

## 2021-01-25 LAB — COMPREHENSIVE METABOLIC PANEL
ALT: 120 U/L — ABNORMAL HIGH (ref 0–44)
AST: 60 U/L — ABNORMAL HIGH (ref 15–41)
Albumin: 4.3 g/dL (ref 3.5–5.0)
Alkaline Phosphatase: 41 U/L (ref 38–126)
Anion gap: 6 (ref 5–15)
BUN: 13 mg/dL (ref 6–20)
CO2: 27 mmol/L (ref 22–32)
Calcium: 9.1 mg/dL (ref 8.9–10.3)
Chloride: 105 mmol/L (ref 98–111)
Creatinine, Ser: 0.52 mg/dL (ref 0.44–1.00)
GFR, Estimated: >60 mL/min (ref >60)
Glucose, Bld: 110 mg/dL — ABNORMAL HIGH (ref 70–99)
Potassium: 3.6 mmol/L (ref 3.5–5.1)
Sodium: 138 mmol/L (ref 135–145)
Total Bilirubin: 1 mg/dL (ref 0.3–1.2)
Total Protein: 7.3 g/dL (ref 6.5–8.1)

## 2021-01-25 LAB — GLUCOSE, CAPILLARY: Glucose-Capillary: 108 mg/dL — ABNORMAL HIGH (ref 70–99)

## 2021-01-25 NOTE — Progress Notes (Signed)
IBM'd Dr. Malachi Paradise concerning procedure consent for upcoming sx.  Per pt, she discussed bilateral mastectomy, whereas the current consent states "LEFT MODIFIED RADICAL MASTECTOMY". Pt to sign consent DOS.

## 2021-01-25 NOTE — Progress Notes (Signed)
PCP - [name], MD Cardiologist - Denies Oncologist- Lurline Del, MD  PPM/ICD - Denies  Chest x-ray - N/A EKG - 01/25/21 Stress Test - Denies ECHO - 10/13/20 Cardiac Cath - Denies  Sleep Study - Denies  Fasting Blood Sugar - 110-112 Checks Blood Sugar 3 times a day. CBG at PAT appt wa 108. Last A1C was 7.6 12/19/20  Blood Thinner Instructions: N/A Aspirin Instructions: N/A  ERAS Protcol - Yes PRE-SURGERY Ensure or G2- Not ordered  COVID TEST- Pt instructed to go to [facility] 01/29/21. Pt given map with address as well as lab requisition form   Anesthesia review: Yes, current chemotherapy  Patient denies shortness of breath, fever, cough and chest pain at PAT appointment   All instructions explained to the patient, with a verbal understanding of the material. Patient agrees to go over the instructions while at home for a better understanding. The opportunity to ask questions was provided.

## 2021-01-26 NOTE — Anesthesia Preprocedure Evaluation (Addendum)
Anesthesia Evaluation  Patient identified by MRN, date of birth, ID band Patient awake    Reviewed: Allergy & Precautions, NPO status , Patient's Chart, lab work & pertinent test results  Airway Mallampati: II  TM Distance: >3 FB Neck ROM: Full    Dental no notable dental hx. (+) Dental Advisory Given   Pulmonary asthma ,    Pulmonary exam normal breath sounds clear to auscultation       Cardiovascular Exercise Tolerance: Good Normal cardiovascular exam Rhythm:Regular Rate:Normal  Normal sinus rhythm Normal ECG Confirmed by Thompson Grayer ([id number]) on 01/26/2021 10:20:23 PM   Neuro/Psych  Headaches, negative psych ROS   GI/Hepatic   Endo/Other  diabetes, Type 2, Oral Hypoglycemic AgentsMorbid obesityPCOS   Renal/GU   negative genitourinary   Musculoskeletal   Abdominal   Peds  Hematology   Anesthesia Other Findings Breast cancer  Reproductive/Obstetrics                           Anesthesia Physical Anesthesia Plan  ASA: 3  Anesthesia Plan: General and Regional   Post-op Pain Management: GA combined w/ Regional for post-op pain   Induction: Intravenous  PONV Risk Score and Plan:   Airway Management Planned: Oral ETT  Additional Equipment:   Intra-op Plan:   Post-operative Plan: Extubation in OR  Informed Consent: I have reviewed the patients History and Physical, chart, labs and discussed the procedure including the risks, benefits and alternatives for the proposed anesthesia with the patient or authorized representative who has indicated his/her understanding and acceptance.     Dental advisory given  Plan Discussed with: CRNA and Anesthesiologist  Anesthesia Plan Comments: ( )       Anesthesia Quick Evaluation

## 2021-01-26 NOTE — Progress Notes (Signed)
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 Surgery was initially scheduled for 12/20/20, but she presented for surgery with CBG of 394 in setting of drinking an Ensure and preoperative labs consistent with new diagnosis of DM2. Since then she has had PCP follow-up and was started on metformin XR 500 mg daily.   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 Right IJ Port-a-cath 10/18/20.  BMI is consistent with obesity.   Per oncology notes by Dr. Jana Hakim she only received two cycles of chemotherapy, last on 12/06/20, due to worsening elevated LFTs. She started zometa 11/01/20 and to be given every 12 weeks. She is on goserelin injections, had received her fourth injection 12/28/20. She had an MRI of the liver performed 11/30/20 which fortunately, did not show any evidence of metastatic disease but it did note some hepatic steatosis. 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 Acute hepatitis panel was negative on 12/06/20. She denied abdominal pain, vomiting, alcohol use, diarrhea, or constipation. She denied herbal supplement use and Tylenol use. At her last visit on 12/28/20, Dr. Jana Hakim referred her to [facility], Karleen Hampshire, MD) for elevated LFTs, although trending down. He noted anticipated new surgery date sometime in August.    Labs on 01/25/21 show elevated but improved AST  (60, down from 251) and ALT (120, down from 515) since 12/28/20. Her alk phos, bilirubin and CBC were normal. Random glucose 110. She reported fasting CBGs ~ 110-112. A1c 7.6% on 12/19/20, but now she in on metformin.   Preoperative COVID-19 test is scheduled for 01/29/21. Anesthesia team to evaluated on the day of surgery.    VS: BP (!) 156/94   Pulse 75   Temp 36.7 C (Oral)   Resp 18   Ht 5' (1.524 m)   Wt 86.7 kg   SpO2 100%   BMI 37.32 kg/m   PROVIDERS: [name], MD is PCP  [phone] (Work) [phone] (Fax) [place], [place] [address]   Lurline Del, MD is HEM-ONC   LABS: Labs reviewed: Acceptable for surgery. (all labs ordered are listed, but only abnormal results are displayed)  Labs Reviewed  GLUCOSE, CAPILLARY - Abnormal; Notable for the following components:      Result Value   Glucose-Capillary 108 (*)    All other components within normal limits  COMPREHENSIVE METABOLIC PANEL - Abnormal; Notable for the following components:   Glucose, Bld 110 (*)    AST 60 (*)    ALT 120 (*)    All other components within normal limits  CBC WITH DIFFERENTIAL/PLATELET  Acute hepatitis panel negative 12/06/20.    IMAGES: MRI Liver 11/30/20: IMPRESSION: 1. No evidence for metastatic disease to the liver. No intra or extrahepatic biliary duct dilatation. 2. Hepatic steatosis. 3. Numerous tiny enhancing lesions in the spine are compatible with metastatic disease, better  characterized on recent spine MRI of 10/21/2020.   CXR 11/29/20: FINDINGS: Heart size upper normal. Vascularity normal. Lungs are clear without infiltrate effusion or mass. Port-A-Cath tip in the SVC unchanged. No interval change. IMPRESSION: No active cardiopulmonary disease.   Bone Scan 10/13/20: IMPRESSION: 1. Lucent lesions were seen in the sternum and T11 on the CT scan from today. Purely lucent metastases are often not well visualized on bone scans. An MRI was  recommended and would be much more sensitive. 2. Uptake near midline of the lower thoracic or upper lumbar vertebral body. The finding is nonspecific but at least somewhat concerning for a metastasis given history and CT of the chest findings. Extending the recommended MRI of the thoracic spine through L1 would be helpful to evaluate this region. 3. No other scintigraphic evidence of bony metastatic disease. 4. Increased uptake in the left breast consistent with known malignancy.     EKG: 01/25/21: NSR     CV: Echo 10/13/20: IMPRESSIONS   1. Left ventricular ejection fraction, by estimation, is 60 to 65%. The  left ventricle has normal function. The left ventricle has no regional  wall motion abnormalities. Left ventricular diastolic parameters are  normal. The average left ventricular  global longitudinal strain is -18.6 %. The global longitudinal strain is  low normal.   2. Right ventricular systolic function is normal. The right ventricular  size is normal.   3. The mitral valve is abnormal. Trivial mitral valve regurgitation.   4. The aortic valve is tricuspid. Aortic valve regurgitation is not  visualized.   5. The inferior vena cava is normal in size with greater than 50%  respiratory variability, suggesting right atrial pressure of 3 mmHg.  - Comparison(s): No prior Echocardiogram.    Past Medical History:  Diagnosis Date   Asthma    remote   Breast cancer ([place])    Elevated liver enzymes    Family history of breast cancer    Family history of ovarian cancer    Family history of prostate cancer    Family history of stomach cancer    Hepatic steatosis    Hyperglycemia    A1c 7.6% 12/19/20   Migraines    PCOS (polycystic ovarian syndrome)    Pre-diabetes     Past Surgical History:  Procedure Laterality Date   NO PAST SURGERIES     PORTACATH PLACEMENT Right 10/18/2020   Procedure: INSERTION PORT-A-CATH;  Surgeon: Erroll Luna, MD;  Location: [facility];  Service: General;  Laterality: Right;    MEDICATIONS:  anastrozole (ARIMIDEX) 1 MG tablet   calcium carbonate (TUMS - DOSED IN MG ELEMENTAL CALCIUM) 500 MG chewable tablet   cholecalciferol (VITAMIN D3) 25 MCG (1000 UNIT) tablet   Goserelin Acetate (ZOLADEX [place])   metFORMIN (GLUCOPHAGE-XR) 500 MG 24 hr tablet   venlafaxine XR (EFFEXOR-XR) 37.5 MG 24 hr capsule   Vitamin D, Ergocalciferol, (DRISDOL) 50000 UNITS CAPS capsule   No current facility-administered medications for this encounter.    Myra Gianotti, PA-C Surgical Short Stay/Anesthesiology [facility] Phone [phone] [facility] Phone [phone] 01/26/2021 4:15 PM

## 2021-01-29 ENCOUNTER — Other Ambulatory Visit: Payer: Self-pay | Admitting: Surgery

## 2021-01-30 LAB — SARS CORONAVIRUS 2 (TAT 6-24 HRS): SARS Coronavirus 2: NEGATIVE

## 2021-01-31 ENCOUNTER — Observation Stay (HOSPITAL_COMMUNITY)
Admission: RE | Admit: 2021-01-31 | Discharge: 2021-02-01 | Disposition: A | Discharge status: Home / Self Care | Payer: Medicaid Other | Attending: Surgery | Admitting: Surgery

## 2021-01-31 ENCOUNTER — Encounter (HOSPITAL_COMMUNITY): Payer: Self-pay | Admitting: Surgery

## 2021-01-31 ENCOUNTER — Ambulatory Visit: Payer: Self-pay

## 2021-01-31 ENCOUNTER — Encounter (HOSPITAL_COMMUNITY)
Admission: RE | Disposition: A | Discharge status: Home / Self Care | Payer: Self-pay | Source: Home / Self Care | Attending: Surgery

## 2021-01-31 ENCOUNTER — Encounter (HOSPITAL_COMMUNITY)
Admission: RE | Admit: 2021-01-31 | Discharge: 2021-01-31 | Disposition: A | Discharge status: Home / Self Care | Payer: Medicaid Other | Source: Ambulatory Visit | Attending: Surgery | Admitting: Surgery

## 2021-01-31 ENCOUNTER — Ambulatory Visit (HOSPITAL_COMMUNITY): Payer: Medicaid Other | Admitting: Physician Assistant

## 2021-01-31 ENCOUNTER — Ambulatory Visit (HOSPITAL_COMMUNITY): Payer: Medicaid Other | Admitting: Anesthesiology

## 2021-01-31 ENCOUNTER — Other Ambulatory Visit: Payer: Self-pay

## 2021-01-31 DIAGNOSIS — J45909 Unspecified asthma, uncomplicated: Secondary | ICD-10-CM | POA: Insufficient documentation

## 2021-01-31 DIAGNOSIS — C50912 Malignant neoplasm of unspecified site of left female breast: Principal | ICD-10-CM | POA: Diagnosis present

## 2021-01-31 DIAGNOSIS — E119 Type 2 diabetes mellitus without complications: Secondary | ICD-10-CM | POA: Insufficient documentation

## 2021-01-31 DIAGNOSIS — N6081 Other benign mammary dysplasias of right breast: Secondary | ICD-10-CM | POA: Diagnosis not present

## 2021-01-31 HISTORY — PX: MODIFIED MASTECTOMY: SHX5268

## 2021-01-31 HISTORY — PX: MASTECTOMY W/ SENTINEL NODE BIOPSY: SHX2001

## 2021-01-31 LAB — GLUCOSE, CAPILLARY
Glucose-Capillary: 94 mg/dL (ref 70–99)
Glucose-Capillary: 97 mg/dL (ref 70–99)
Glucose-Capillary: 126 mg/dL — ABNORMAL HIGH (ref 70–99)
Glucose-Capillary: 185 mg/dL — ABNORMAL HIGH (ref 70–99)

## 2021-01-31 LAB — POCT PREGNANCY, URINE: Preg Test, Ur: NEGATIVE

## 2021-01-31 SURGERY — MODIFIED MASTECTOMY
Anesthesia: Regional | Site: Breast | Laterality: Right

## 2021-01-31 MED ORDER — ROCURONIUM BROMIDE 10 MG/ML (PF) SYRINGE
PREFILLED_SYRINGE | INTRAVENOUS | Status: AC
  Filled 2021-01-31: qty 10

## 2021-01-31 MED ORDER — EPHEDRINE SULFATE-NACL 50-0.9 MG/10ML-% IV SOSY
PREFILLED_SYRINGE | INTRAVENOUS | Status: DC | PRN
  Administered 2021-01-31: 10 mg via INTRAVENOUS

## 2021-01-31 MED ORDER — ENOXAPARIN SODIUM 40 MG/0.4ML IJ SOSY
40.0000 mg | PREFILLED_SYRINGE | INTRAMUSCULAR | Status: DC
  Administered 2021-02-01: 40 mg via SUBCUTANEOUS
  Filled 2021-01-31: qty 0.4

## 2021-01-31 MED ORDER — ACETAMINOPHEN 500 MG PO TABS
1000.0000 mg | ORAL_TABLET | Freq: Once | ORAL | Status: AC
  Administered 2021-01-31: 1000 mg via ORAL
  Filled 2021-01-31: qty 2

## 2021-01-31 MED ORDER — 0.9 % SODIUM CHLORIDE (POUR BTL) OPTIME
TOPICAL | Status: DC | PRN
  Administered 2021-01-31: 1000 mL

## 2021-01-31 MED ORDER — SCOPOLAMINE 1 MG/3DAYS TD PT72
1.0000 | MEDICATED_PATCH | TRANSDERMAL | Status: DC
  Administered 2021-01-31: 1.5 mg via TRANSDERMAL
  Filled 2021-01-31: qty 1

## 2021-01-31 MED ORDER — FENTANYL CITRATE (PF) 250 MCG/5ML IJ SOLN
INTRAMUSCULAR | Status: AC
  Filled 2021-01-31: qty 5

## 2021-01-31 MED ORDER — TECHNETIUM TC 99M TILMANOCEPT KIT
1.0000 | PACK | Freq: Once | INTRAVENOUS | Status: AC | PRN
  Administered 2021-01-31: 1 via INTRADERMAL

## 2021-01-31 MED ORDER — MIDAZOLAM HCL 2 MG/2ML IJ SOLN
INTRAMUSCULAR | Status: AC
  Filled 2021-01-31: qty 2

## 2021-01-31 MED ORDER — PROMETHAZINE HCL 25 MG/ML IJ SOLN
INTRAMUSCULAR | Status: AC
  Filled 2021-01-31: qty 1

## 2021-01-31 MED ORDER — ONDANSETRON HCL 4 MG/2ML IJ SOLN
INTRAMUSCULAR | Status: AC
  Filled 2021-01-31: qty 2

## 2021-01-31 MED ORDER — METFORMIN HCL ER 500 MG PO TB24
1000.0000 mg | ORAL_TABLET | Freq: Every day | ORAL | Status: DC
  Administered 2021-01-31: 1000 mg via ORAL
  Filled 2021-01-31: qty 2

## 2021-01-31 MED ORDER — FENTANYL CITRATE (PF) 100 MCG/2ML IJ SOLN
INTRAMUSCULAR | Status: AC
  Administered 2021-01-31: 100 ug via INTRAVENOUS
  Filled 2021-01-31: qty 2

## 2021-01-31 MED ORDER — ROCURONIUM BROMIDE 10 MG/ML (PF) SYRINGE
PREFILLED_SYRINGE | INTRAVENOUS | Status: DC | PRN
  Administered 2021-01-31: 70 mg via INTRAVENOUS
  Administered 2021-01-31: 30 mg via INTRAVENOUS

## 2021-01-31 MED ORDER — DEXAMETHASONE SODIUM PHOSPHATE 10 MG/ML IJ SOLN
INTRAMUSCULAR | Status: AC
  Filled 2021-01-31: qty 1

## 2021-01-31 MED ORDER — VENLAFAXINE HCL ER 37.5 MG PO CP24
37.5000 mg | ORAL_CAPSULE | Freq: Every day | ORAL | Status: DC
  Administered 2021-02-01: 37.5 mg via ORAL
  Filled 2021-01-31: qty 1

## 2021-01-31 MED ORDER — CEFAZOLIN SODIUM-DEXTROSE 2-4 GM/100ML-% IV SOLN
2.0000 g | INTRAVENOUS | Status: AC
Start: 2021-02-01 — End: 2021-01-31
  Administered 2021-01-31: 2 g via INTRAVENOUS
  Filled 2021-01-31: qty 100

## 2021-01-31 MED ORDER — FENTANYL CITRATE (PF) 100 MCG/2ML IJ SOLN: 25.0000 ug | INTRAMUSCULAR | Status: DC | PRN

## 2021-01-31 MED ORDER — MIDAZOLAM HCL 2 MG/2ML IJ SOLN
INTRAMUSCULAR | Status: DC | PRN
  Administered 2021-01-31: 2 mg via INTRAVENOUS

## 2021-01-31 MED ORDER — PROPOFOL 10 MG/ML IV BOLUS
INTRAVENOUS | Status: DC | PRN
  Administered 2021-01-31: 170 mg via INTRAVENOUS

## 2021-01-31 MED ORDER — FENTANYL CITRATE (PF) 250 MCG/5ML IJ SOLN
INTRAMUSCULAR | Status: DC | PRN
  Administered 2021-01-31: 50 ug via INTRAVENOUS
  Administered 2021-01-31: 100 ug via INTRAVENOUS
  Administered 2021-01-31 (×2): 50 ug via INTRAVENOUS

## 2021-01-31 MED ORDER — ONDANSETRON 4 MG PO TBDP: 4.0000 mg | ORAL_TABLET | Freq: Four times a day (QID) | ORAL | Status: DC | PRN

## 2021-01-31 MED ORDER — OXYCODONE HCL 5 MG PO TABS
5.0000 mg | ORAL_TABLET | ORAL | Status: DC | PRN
  Filled 2021-01-31: qty 2

## 2021-01-31 MED ORDER — ONDANSETRON HCL 4 MG/2ML IJ SOLN
INTRAMUSCULAR | Status: DC | PRN
  Administered 2021-01-31: 4 mg via INTRAVENOUS

## 2021-01-31 MED ORDER — PROMETHAZINE HCL 25 MG/ML IJ SOLN
6.2500 mg | INTRAMUSCULAR | Status: DC | PRN
  Administered 2021-01-31: 6.25 mg via INTRAVENOUS

## 2021-01-31 MED ORDER — HEMOSTATIC AGENTS (NO CHARGE) OPTIME
TOPICAL | Status: DC | PRN
  Administered 2021-01-31: 8 via TOPICAL

## 2021-01-31 MED ORDER — EPHEDRINE 5 MG/ML INJ
INTRAVENOUS | Status: AC
  Filled 2021-01-31: qty 5

## 2021-01-31 MED ORDER — MIDAZOLAM HCL 2 MG/2ML IJ SOLN
INTRAMUSCULAR | Status: AC
  Administered 2021-01-31: 2 mg via INTRAVENOUS
  Filled 2021-01-31: qty 2

## 2021-01-31 MED ORDER — CEFAZOLIN SODIUM-DEXTROSE 2-4 GM/100ML-% IV SOLN
2.0000 g | Freq: Three times a day (TID) | INTRAVENOUS | Status: AC
  Administered 2021-01-31: 2 g via INTRAVENOUS
  Filled 2021-01-31: qty 100

## 2021-01-31 MED ORDER — DEXAMETHASONE SODIUM PHOSPHATE 10 MG/ML IJ SOLN
INTRAMUSCULAR | Status: DC | PRN
  Administered 2021-01-31: 10 mg via INTRAVENOUS

## 2021-01-31 MED ORDER — FENTANYL CITRATE (PF) 100 MCG/2ML IJ SOLN
100.0000 ug | Freq: Once | INTRAMUSCULAR | Status: AC
Start: 2021-01-31 — End: 2021-01-31

## 2021-01-31 MED ORDER — ONDANSETRON HCL 4 MG/2ML IJ SOLN: 4.0000 mg | Freq: Four times a day (QID) | INTRAMUSCULAR | Status: DC | PRN

## 2021-01-31 MED ORDER — ZOLPIDEM TARTRATE 5 MG PO TABS: 5.0000 mg | ORAL_TABLET | Freq: Every evening | ORAL | Status: DC | PRN

## 2021-01-31 MED ORDER — DROPERIDOL 2.5 MG/ML IJ SOLN: 0.6250 mg | Freq: Once | INTRAMUSCULAR | Status: DC | PRN

## 2021-01-31 MED ORDER — OXYCODONE HCL 5 MG/5ML PO SOLN: 5.0000 mg | Freq: Once | ORAL | Status: DC | PRN

## 2021-01-31 MED ORDER — SUGAMMADEX SODIUM 200 MG/2ML IV SOLN
INTRAVENOUS | Status: DC | PRN
  Administered 2021-01-31: 200 mg via INTRAVENOUS

## 2021-01-31 MED ORDER — BUPIVACAINE LIPOSOME 1.3 % IJ SUSP
INTRAMUSCULAR | Status: DC | PRN
  Administered 2021-01-31: 10 mL

## 2021-01-31 MED ORDER — SODIUM CHLORIDE 0.9 % IV SOLN: INTRAVENOUS | Status: DC

## 2021-01-31 MED ORDER — HYDROMORPHONE HCL 1 MG/ML IJ SOLN: 1.0000 mg | INTRAMUSCULAR | Status: DC | PRN

## 2021-01-31 MED ORDER — OXYCODONE HCL 5 MG PO TABS: 5.0000 mg | ORAL_TABLET | Freq: Once | ORAL | Status: DC | PRN

## 2021-01-31 MED ORDER — CHLORHEXIDINE GLUCONATE CLOTH 2 % EX PADS: 6.0000 | MEDICATED_PAD | Freq: Once | CUTANEOUS | Status: DC

## 2021-01-31 MED ORDER — CHLORHEXIDINE GLUCONATE 0.12 % MT SOLN
15.0000 mL | Freq: Once | OROMUCOSAL | Status: AC
  Administered 2021-01-31: 15 mL via OROMUCOSAL
  Filled 2021-01-31: qty 15

## 2021-01-31 MED ORDER — INSULIN ASPART 100 UNIT/ML IJ SOLN
0.0000 [IU] | Freq: Three times a day (TID) | INTRAMUSCULAR | Status: DC
Start: 2021-02-01 — End: 2021-02-01

## 2021-01-31 MED ORDER — LACTATED RINGERS IV SOLN: INTRAVENOUS | Status: DC

## 2021-01-31 MED ORDER — MIDAZOLAM HCL 2 MG/2ML IJ SOLN: 2.0000 mg | Freq: Once | INTRAMUSCULAR | Status: AC

## 2021-01-31 MED ORDER — TRAMADOL HCL 50 MG PO TABS
50.0000 mg | ORAL_TABLET | Freq: Four times a day (QID) | ORAL | Status: DC | PRN
  Administered 2021-02-01: 50 mg via ORAL
  Filled 2021-01-31: qty 1

## 2021-01-31 MED ORDER — ORAL CARE MOUTH RINSE: 15.0000 mL | Freq: Once | OROMUCOSAL | Status: AC

## 2021-01-31 MED ORDER — METHOCARBAMOL 500 MG PO TABS: 500.0000 mg | ORAL_TABLET | Freq: Four times a day (QID) | ORAL | Status: DC | PRN

## 2021-01-31 MED ORDER — BUPIVACAINE HCL (PF) 0.5 % IJ SOLN
INTRAMUSCULAR | Status: DC | PRN
  Administered 2021-01-31: 15 mL

## 2021-01-31 MED ORDER — PROPOFOL 10 MG/ML IV BOLUS
INTRAVENOUS | Status: AC
  Filled 2021-01-31: qty 20

## 2021-01-31 MED ORDER — SODIUM CHLORIDE 0.9 % IV SOLN
INTRAVENOUS | Status: DC | PRN
  Administered 2021-01-31: 500 mL

## 2021-01-31 SURGICAL SUPPLY — 52 items
APPLIER CLIP 9.375 MED OPEN (MISCELLANEOUS) ×6
BAG COUNTER SPONGE SURGICOUNT (BAG) ×3 IMPLANT
BINDER BREAST LRG (GAUZE/BANDAGES/DRESSINGS) IMPLANT
BINDER BREAST XLRG (GAUZE/BANDAGES/DRESSINGS) IMPLANT
BINDER BREAST XXLRG (GAUZE/BANDAGES/DRESSINGS) ×3 IMPLANT
CANISTER SUCT 3000ML PPV (MISCELLANEOUS) ×3 IMPLANT
CHLORAPREP W/TINT 26 (MISCELLANEOUS) ×6 IMPLANT
CLEANER TIP ELECTROSURG 2X2 (MISCELLANEOUS) ×3 IMPLANT
CLIP APPLIE 9.375 MED OPEN (MISCELLANEOUS) ×4 IMPLANT
CNTNR URN SCR LID CUP LEK RST (MISCELLANEOUS) ×2 IMPLANT
CONT SPEC 4OZ STRL OR WHT (MISCELLANEOUS) ×1
COVER PROBE W GEL 5X96 (DRAPES) ×3 IMPLANT
COVER SURGICAL LIGHT HANDLE (MISCELLANEOUS) ×3 IMPLANT
DERMABOND ADVANCED (GAUZE/BANDAGES/DRESSINGS) ×4
DERMABOND ADVANCED .7 DNX12 (GAUZE/BANDAGES/DRESSINGS) ×8 IMPLANT
DEVICE DISSECT PLASMABLAD 3.0S (MISCELLANEOUS) IMPLANT
DRAIN CHANNEL 19F RND (DRAIN) ×12 IMPLANT
DRAPE LAPAROSCOPIC ABDOMINAL (DRAPES) ×3 IMPLANT
ELECT CAUTERY BLADE 6.4 (BLADE) ×3 IMPLANT
ELECT REM PT RETURN 9FT ADLT (ELECTROSURGICAL) ×3
ELECTRODE REM PT RTRN 9FT ADLT (ELECTROSURGICAL) ×2 IMPLANT
EVACUATOR SILICONE 100CC (DRAIN) ×12 IMPLANT
GAUZE SPONGE 4X4 12PLY STRL (GAUZE/BANDAGES/DRESSINGS) ×3 IMPLANT
GLOVE SRG 8 PF TXTR STRL LF DI (GLOVE) ×2 IMPLANT
GLOVE SURG ENC MOIS LTX SZ8 (GLOVE) ×3 IMPLANT
GLOVE SURG UNDER POLY LF SZ8 (GLOVE) ×1
GOWN STRL REUS W/ TWL LRG LVL3 (GOWN DISPOSABLE) ×4 IMPLANT
GOWN STRL REUS W/ TWL XL LVL3 (GOWN DISPOSABLE) ×2 IMPLANT
GOWN STRL REUS W/TWL LRG LVL3 (GOWN DISPOSABLE) ×2
GOWN STRL REUS W/TWL XL LVL3 (GOWN DISPOSABLE) ×1
HEMOSTAT ARISTA ABSORB 3G PWDR (HEMOSTASIS) ×24 IMPLANT
KIT BASIN OR (CUSTOM PROCEDURE TRAY) ×3 IMPLANT
KIT TURNOVER KIT B (KITS) ×3 IMPLANT
NEEDLE 18GX1X1/2 (RX/OR ONLY) (NEEDLE) ×3 IMPLANT
NEEDLE FILTER BLUNT 18X 1/2SAF (NEEDLE)
NEEDLE FILTER BLUNT 18X1 1/2 (NEEDLE) IMPLANT
NEEDLE HYPO 25GX1X1/2 BEV (NEEDLE) ×3 IMPLANT
NS IRRIG 1000ML POUR BTL (IV SOLUTION) ×3 IMPLANT
PACK GENERAL/GYN (CUSTOM PROCEDURE TRAY) ×3 IMPLANT
PAD ABD 8X10 STRL (GAUZE/BANDAGES/DRESSINGS) ×6 IMPLANT
PAD ARMBOARD 7.5X6 YLW CONV (MISCELLANEOUS) ×3 IMPLANT
PENCIL SMOKE EVACUATOR (MISCELLANEOUS) ×3 IMPLANT
PLASMABLADE 3.0S (MISCELLANEOUS)
SPECIMEN JAR X LARGE (MISCELLANEOUS) ×6 IMPLANT
SPONGE T-LAP 18X18 ~~LOC~~+RFID (SPONGE) ×9 IMPLANT
SUT ETHILON 2 0 FS 18 (SUTURE) ×12 IMPLANT
SUT ETHILON 3 0 FSL (SUTURE) ×3 IMPLANT
SUT MNCRL AB 4-0 PS2 18 (SUTURE) ×3 IMPLANT
SUT VIC AB 3-0 SH 18 (SUTURE) ×6 IMPLANT
SYR CONTROL 10ML LL (SYRINGE) ×3 IMPLANT
TOWEL GREEN STERILE (TOWEL DISPOSABLE) ×3 IMPLANT
TOWEL GREEN STERILE FF (TOWEL DISPOSABLE) ×3 IMPLANT

## 2021-01-31 NOTE — Discharge Instructions (Signed)
[facility] [phone]  MASTECTOMY: POST OP INSTRUCTIONS  Always review your discharge instruction sheet given to you by the facility where your surgery was performed. IF YOU HAVE DISABILITY OR FAMILY LEAVE FORMS, YOU MUST BRING THEM TO THE OFFICE FOR PROCESSING.   DO NOT GIVE THEM TO YOUR DOCTOR. A prescription for pain medication may be given to you upon discharge.  Take your pain medication as prescribed, if needed.  If narcotic pain medicine is not needed, then you may take acetaminophen (Tylenol) or ibuprofen (Advil) as needed. Take your usually prescribed medications unless otherwise directed. If you need a refill on your pain medication, please contact your pharmacy.  They will contact our office to request authorization.  Prescriptions will not be filled after 5pm or on week-ends. You should follow a light diet the first few days after arrival home, such as soup and crackers, etc.  Resume your normal diet the day after surgery. Most patients will experience some swelling and bruising on the chest and underarm.  Ice packs will help.  Swelling and bruising can take several days to resolve.  It is common to experience some constipation if taking pain medication after surgery.  Increasing fluid intake and taking a stool softener (such as Colace) will usually help or prevent this problem from occurring.  A mild laxative (Milk of Magnesia or Miralax) should be taken according to package instructions if there are no bowel movements after 48 hours. Unless discharge instructions indicate otherwise, leave your bandage dry and in place until your next appointment in 3-5 days.  You may take a limited sponge bath.  No tube baths or showers until the drains are removed.  You may have steri-strips (small skin tapes) in place directly over the incision.  These strips should be left on the skin for 7-10 days.  If your surgeon used skin glue on the incision, you may shower in 24 hours.   The glue will flake off over the next 2-3 weeks.  Any sutures or staples will be removed at the office during your follow-up visit. DRAINS:  If you have drains in place, it is important to keep a list of the amount of drainage produced each day in your drains.  Before leaving the hospital, you should be instructed on drain care.  Call our office if you have any questions about your drains. ACTIVITIES:  You may resume regular (light) daily activities beginning the next day--such as daily self-care, walking, climbing stairs--gradually increasing activities as tolerated.  You may have sexual intercourse when it is comfortable.  Refrain from any heavy lifting or straining until approved by your doctor. You may drive when you are no longer taking prescription pain medication, you can comfortably wear a seatbelt, and you can safely maneuver your car and apply brakes. RETURN TO WORK:  __________________________________________________________ You should see your doctor in the office for a follow-up appointment approximately 3-5 days after your surgery.  Your doctor's nurse will typically make your follow-up appointment when she calls you with your pathology report.  Expect your pathology report 2-3 business days after your surgery.  You may call to check if you do not hear from us after three days.   OTHER INSTRUCTIONS: ______________________________________________________________________________________________ ____________________________________________________________________________________________ WHEN TO CALL YOUR DOCTOR: Fever over 101.0 Nausea and/or vomiting Extreme swelling or bruising Continued bleeding from incision. Increased pain, redness, or drainage from the incision. The clinic staff is available to answer your questions during regular business hours.  Please don't hesitate   to call and ask to speak to one of the nurses for clinical concerns.  If you have a medical emergency, go to the  nearest emergency room or call 911.  A surgeon from [facility] is always on call at the hospital. [address], [address], [place], [place]  [address] ? [address] [address], [place], [place]   [address] [phone] ? [phone] ? FAX [phone] Web site: [email or web address]  

## 2021-01-31 NOTE — Anesthesia Procedure Notes (Addendum)
Procedure Name: Intubation Date/Time: 01/31/2021 2:51 PM Performed by: Lance Coon, CRNA Pre-anesthesia Checklist: Patient identified, Emergency Drugs available, Suction available, Patient being monitored and Timeout performed Patient Re-evaluated:Patient Re-evaluated prior to induction Oxygen Delivery Method: Circle system utilized Preoxygenation: Pre-oxygenation with 100% oxygen Induction Type: IV induction Ventilation: Mask ventilation without difficulty Laryngoscope Size: Miller and 2 Grade View: Grade I Tube type: Oral Tube size: 7.0 mm Number of attempts: 1 Airway Equipment and Method: Stylet Placement Confirmation: ETT inserted through vocal cords under direct vision, positive ETCO2 and breath sounds checked- equal and bilateral Secured at: 21 cm Dental Injury: Teeth and Oropharynx as per pre-operative assessment

## 2021-01-31 NOTE — Interval H&P Note (Signed)
History and Physical Interval Note:  01/31/2021 2:28 PM  Gabriela Sullivan  has presented today for surgery, with the diagnosis of LEFT BREAST CANCER AND RIGHT DCIS.  The various methods of treatment have been discussed with the patient and family. After consideration of risks, benefits and other options for treatment, the patient has consented to  Procedure(s): LEFT MODIFIED RADICAL MASTECTOMY (Left) RIGHT SIMPLE MASTECTOMY WITH RIGHT SENTINEL LYMPH NODE BIOPSY (Right) as a surgical intervention.  The patient's history has been reviewed, patient examined, no change in status, stable for surgery.  I have reviewed the patient's chart and labs.  Questions were answered to the patient's satisfaction.     [place]

## 2021-01-31 NOTE — H&P (Signed)
Chief Complaint: surgery comsult  30 y.o. 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 as follows:             (0) status post left breast biopsies x2 and left axilla lymph node biopsy 09/26/2020 for a clinical T3 N1(f), stage IIa invasive ductal carcinoma, grade 2, estrogen and progesterone receptor positive, HER2 not amplified, with an MIB-1 in the 10-15% range             (a) right breast biopsy 09/25/2020 showed ductal carcinoma in situ, grade 1, estrogen and progesterone receptor positive             (b) breast MRI 10/06/2020 shows a 11 cm area of malignancy, with a second 1.9 cm mass, and skin thickening concerning for inflammatory breast carcinoma; at least 6 abnormal lymph nodes noted             (c) chest CT scan 10/13/2020 shows clear lungs and no obvious liver involvement but multiple lytic bone lesions in addition to the findings and MRI above             (d) bone scan 10/13/2020 shows nonspecific findings (lytic lesions not expected to be "hot")             (e) total spinal MRI 10/21/2020 shows 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  No cervical vertebral metastases noted.  There is also degenerative disc disease.     History of Present Illness: Gabriela Sullivan is a 30 y.o. female who is seen today for LEFT BREAST INFLAMMATORY CANCER FOLLOW up and right DCIS  She is tolerating chemotherapy well.  It was stopped due to elevation in liver function studies.  Liver MRI was read as normal with no visceral disease noted either on CT scan or MRI.  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  She is here to discuss the role of surgery with stage IV breast cancer given her bony metastasis.  Overall, she feels well.                        30 y.o.  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 as follows:             (0) status post left breast biopsies x2 and left axilla lymph node biopsy 09/26/2020 for a clinical T3 N1(f), stage IIa invasive ductal carcinoma, grade 2, estrogen and progesterone receptor positive, HER2 not amplified, with an MIB-1 in the 10-15% range             (a) right breast biopsy 09/25/2020 showed ductal carcinoma in situ, grade 1, estrogen and progesterone receptor positive             (b) breast MRI 10/06/2020 shows a 11 cm area of malignancy, with a second 1.9 cm mass, and skin thickening concerning for inflammatory breast carcinoma; at least 6 abnormal lymph nodes noted             (c) chest CT scan 10/13/2020 shows clear lungs and no obvious liver involvement but multiple lytic bone lesions in addition to the findings and MRI above             (  d) bone scan 10/13/2020 shows nonspecific findings (lytic lesions not expected to be "hot")             (e) total spinal MRI 10/21/2020 shows 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  No cervical vertebral metastases noted.  There is also degenerative disc disease.       Review of Systems: A complete review of systems was obtained from the patient.  I have reviewed this information and discussed as appropriate with the patient.  See HPI as well for other ROS.   Review of Systems  Constitutional: Negative.   HENT: Negative.   Gastrointestinal: Negative.   Musculoskeletal: Negative for back pain and myalgias.  Skin: Negative.   Neurological: Negative.   Psychiatric/Behavioral: Negative for depression and suicidal ideas.        Medical History: Past Medical History         Past Medical History:  Diagnosis Date    Asthma, unspecified asthma severity, unspecified whether complicated, unspecified whether persistent     History of cancer          There is no problem list on file for this patient.     Past Surgical History           Past Surgical History:  Procedure Laterality Date   port placement surgery  N/A          Allergies  No Known Allergies                 Current Outpatient Medications on File Prior to Visit  Medication Sig Dispense Refill   anastrozole (ARIMIDEX) 1 mg tablet          No current facility-administered medications on file prior to visit.      Family History           Family History  Problem Relation Age of Onset   Diabetes Mother     High blood pressure (Hypertension) Mother     Breast cancer Other          Social History         Tobacco Use  Smoking Status Never Smoker  Smokeless Tobacco Never Used      Social History  Social History           Socioeconomic History   Marital status: Unknown  Tobacco Use   Smoking status: Never Smoker   Smokeless tobacco: Never Used  Substance and Sexual Activity   Alcohol use: Never   Drug use: Never   Sexual activity: Never        Objective:      There were no vitals filed for this visit.  There is no height or weight on file to calculate BMI.       General appearance - alert, well appearing, and in no distress Mental status - alert, oriented to person, place, and time Chest - clear to auscultation, no wheezes, rales or rhonchi, symmetric air entry Heart - normal rate, regular rhythm, normal S1, S2, no murmurs, rubs, clicks or gallops Breasts - breasts appear normal, no suspicious masses, no skin or nipple changes or axillary nodes, Previous left breast mass, redness and thickening are gone.  No left axillary adenopathy.  Mild fullness just behind the nipple noted on the left.  Right breast shows no mass lesion today. Neurological - alert, oriented,  normal speech, no focal findings or movement  disorder noted Skin - normal coloration and turgor, no rashes, no suspicious skin lesions noted     Labs, Imaging and Diagnostic Testing:   CLINICAL DATA:  Screening for liver mets. Inflammatory breast cancer with increasing LFTs.   EXAM: MRI ABDOMEN WITHOUT AND WITH CONTRAST   TECHNIQUE: Multiplanar multisequence MR imaging of the abdomen was performed both before and after the administration of intravenous contrast.   CONTRAST:  79mL GADAVIST GADOBUTROL 1 MMOL/ML IV SOLN   COMPARISON:  None.   FINDINGS: Lower chest: Unremarkable.   Hepatobiliary: Diffuse loss of signal intensity in the liver parenchyma on out of phase T1 imaging is compatible with fatty deposition. No suspicious enhancing liver lesion to suggest metastatic disease. No intrahepatic or extrahepatic biliary dilation. Gallbladder is nondistended.   Pancreas: No focal mass lesion. No dilatation of the main duct. No intraparenchymal cyst. No peripancreatic edema.   Spleen:  No splenomegaly. No focal mass lesion.   Adrenals/Urinary Tract: No adrenal nodule or mass. Tiny T2 hyperintensity in the upper pole right kidney is too small to characterize but likely a cyst. Left kidney unremarkable.   Stomach/Bowel: Stomach is unremarkable. No gastric wall thickening. No evidence of outlet obstruction. Duodenum is normally positioned as is the ligament of Treitz. No small bowel wall thickening. No small bowel dilatation.   Vascular/Lymphatic: No abdominal aortic aneurysm. There is no gastrohepatic or hepatoduodenal ligament lymphadenopathy. No retroperitoneal or mesenteric lymphadenopathy.   Other:  No intraperitoneal free fluid.   Musculoskeletal: Numerous tiny enhancing lesions in the spine are compatible with metastatic disease, better characterized on recent spine MRI of 10/21/2020.   IMPRESSION: 1. No evidence for metastatic disease to the liver. No intra or extrahepatic biliary duct dilatation. 2.  Hepatic steatosis. 3. Numerous tiny enhancing lesions in the spine are compatible with metastatic disease, better characterized on recent spine MRI of 10/21/2020.     Electronically Signed   By: Misty Stanley M.D.   On: 12/01/2020 08:20   CLINICAL DATA:  Recent diagnosis left grade 2 invasive ductal carcinoma and ductal carcinoma in situ, low grade ductal carcinoma in situ of the right breast and a metastatic left axillary lymph node.   LABS:  No labs drawn at time of imaging.   EXAM: BILATERAL BREAST MRI WITH AND WITHOUT CONTRAST   TECHNIQUE: Multiplanar, multisequence MR images of both breasts were obtained prior to and following the intravenous administration of 10 ml of Gadavist   Three-dimensional MR images were rendered by post-processing of the original MR data on an independent workstation. The three-dimensional MR images were interpreted, and findings are reported in the following complete MRI report for this study. Three dimensional images were evaluated at the independent interpreting workstation using the DynaCAD thin client.   COMPARISON:  Prior exams.   FINDINGS: Breast composition: c. Heterogeneous fibroglandular tissue.   Background parenchymal enhancement: Mild   Right breast: There is enhancement extending obliquely from superior to inferior consistent with the previous stereotactic biopsy tract. Subtle susceptibility artifact lies along the inferior aspect of this tract consistent with the post biopsy X shaped biopsy clip. At the level of the clip, surrounding enhancement measures 8 mm in greatest dimension. This reflects the low-grade DCIS found on biopsy.   There are no other areas of abnormal right breast enhancement. No defined masses.   Left breast: Large heterogeneous area predominantly non mass enhancement extends from the anterior to the posterior breast, throughout the upper outer and  lower outer quadrants, largest discrete area in the  lower outer quadrant, where there is a small focus of susceptibility artifact consistent with the heart shaped biopsy clip. This portion of the abnormal enhancement, in the lower outer quadrant, spans approximately 5.8 x 3.8 cm transversely. The overall span of the abnormal enhancement measures 11 cm anterior-posterior by 9 cm, superior inferior, by 7 cm right to left.   There is separate mass-like area of abnormal enhancement along the inferior, anterior breast, projecting near 6 o'clock, measuring 1.9 x 0.9 x 1.2 cm. This area demonstrates washout kinetics.   There is skin thickening with hazy increased skin enhancement throughout the left breast most prominent laterally.   Lymph nodes: Multiple enlarged/abnormal left axillary lymph nodes. Artifact from the tribell biopsy clip lies within one of the more lateral lymph nodes. Largest node measures 2.4 cm in short axis. Six lymph nodes are abnormal by size and/or cortical thickness. There are additional prominent subcentimeter lymph nodes. No enlarged or abnormal right axillary lymph nodes.   Ancillary findings:  None.   IMPRESSION: 1. Extensive abnormal, predominantly non mass enhancement throughout the lateral left breast involving the lower outer and upper outer quadrants, consistent with a more extensive expanse of malignancy than evident on mammography or ultrasound. This spans 11 x 7 x 9 cm. 2. Additional mass-like abnormal enhancement in the anterior, inferior left breast suspicious for an additional focus of carcinoma. This spans 1.9 x 0.9 x 1.2 cm. 3. Skin thickening and enhancement of the left breast, which predominates laterally. Findings concerning for inflammatory breast carcinoma. 4. 8 mm area of enhancement surrounds the X shaped biopsy clip in right breast, reflecting the biopsy-proven DCIS. Enhancement is also noted along the biopsy tract. There are no findings to suggest additional right breast malignancy. 5.  Multiple abnormal left axillary lymph nodes, 6 of which are enlarged/abnormal by size/cortical thickness, 1 of these being the biopsy-proven metastatic lymph node.   RECOMMENDATION: 1. MRI guided biopsy the 1.9 cm mass-like area of abnormal enhancement along the anterior, inferior left breast, if this would alter treatment. 2. Otherwise treatment as planned for the known multifocal left breast carcinoma and metastatic lymphadenopathy and small focus of right breast DCIS.   BI-RADS CATEGORY  4: Suspicious.     Electronically Signed   By: Lajean Manes M.D.   On: 10/06/2020 10:58 Assessment and Plan:  Diagnoses and all orders for this visit:   Breast cancer, stage 4, left (CMS-HCC)   Inflammatory breast cancer, left (CMS-HCC)   Ductal carcinoma in situ (DCIS) of right breast       Patient has stage IV disease but only bony involvement.  She had a good response to chemotherapy.  I discussed the pros and cons of mastectomy in the setting but given only her bony metastasis and excellent response to chemotherapy, I feel mastectomy would reduce her risk of local regional recurrence and improve her quality of life.  I discussed reconstruction as well but given her stage IV disease that may need to be addressed at a later time.  She is in total agreement with left modified radical mastectomy and right simple mastectomy.  Plan on doing a right sentinel node.  Discussed the pros and cons and the fact this will not necessarily lengthen her survival but I think has a reasonable chance at improving her quality of life.  Discussed SLN mapping on the right . Sentinel lymph node mapping and dissection has been discussed with the patient.  Risk  of bleeding,  Infection,  Seroma formation,  Additional procedures,,  Shoulder weakness ,  Shoulder stiffness,  Nerve and blood vessel injury and reaction to the mapping dyes have been discussed.  Alternatives to surgery have been discussed with the patient.  The  patient agrees to proceed.  She is in total agreement with surgery would like to proceed to soon as possible.  Risk of mastectomy include bleeding, infection, flap necrosis, lymphedema, numbness, chronic pain, seroma, cosmesis, cardiovascular risk, anesthesia risk, death, DVT, the need for the treatments and/or procedures.   No follow-ups on file.

## 2021-01-31 NOTE — Op Note (Signed)
Preoperative diagnosis: Stage IV breast cancer with left breast inflammatory carcinoma and right breast DCIS status post neoadjuvant chemotherapy  Postoperative diagnosis: Same  Procedure: Left modified radical mastectomy, right simple mastectomy with injection of mag trace on the right and Lymphoseek on the right  Surgeon: Erroll Luna, MD  Anesthesia: General with bilateral pectoral blocks  EBL: 30 cc  Drains: two 19 round drains 2 to the left and 2 to  the right  Specimen: Left breast with left axillary contents and right breast  IV fluids: Per anesthesia record  Indications for procedure: The patient is a 30 year old female diagnosed with inflammatory left breast cancer earlier this year.  She also had right breast DCIS.  She opted for neoadjuvant chemotherapy but also was found to have stage IV disease with bony metastasis only.  She responded very well to chemotherapy.  Most of her disease had cleared and her bony metastasis had resolved significantly.  She was sent for consultation discussed if there is any utility to mastectomy stage IV breast cancer but also consideration treatment of the right side as well.  We had a long discussion the pros and cons of surgery stage IV disease.  Those with bony metastasis have a slightly better prognosis than those with visceral metastasis.  Long-term expectations, recurrence were discussed.  Complications of surgery discussed.  We explained this would not improve her long-term survival but there may possibly be some limited benefit to local control.  After long discussion of the pros and cons of how surgery may help her versus the fact that it will not change her overall survival, she desired to have bilateral mastectomies.  She felt this would be good for her peace of mind and also local control.  After going through the procedure and discussing this at great length, we decided to proceed with left modified radical mastectomy in right simple  mastectomy.  Her goal was to try to preserve her right lymph node basin and mag trace was going to be used to help assist with that so if we had to go back to address her right axillar lymph nodes we could do it later.  I did not feel a large benefit from this but 1 to keep that option open.The surgical and non surgical options have been discussed with the patient.  Risks of surgery include bleeding,  Infection, lymphedema flap necrosis,  Tissue loss,  Chronic pain, death, Numbness,  And the need for additional procedures.  Reconstruction options also have been discussed with the patient as well.  The patient agrees to proceed.    Description of procedure: The patient was met in the holding area and questions were answered.  She underwent bilateral pectoral blocks.  She underwent injection of technetium sulfur colloid into the right breast for possible lymph node mapping.  All questions were answered.  She is brought back to the operating room.  She was placed supine upon the OR table.  After induction of general anesthesia, under sterile conditions we injected 2 cc of mag trace into the right subareolar plexus.  We then prepped and draped in a sterile fashion.  Timeout was performed.  She received appropriate preoperative antibiotics.  The left side was done first.  Curvilinear incision was made above and below the nipple areolar complex.  A superior skin flap was taken to the clavicle, inferior skin flap taken to the inframammary fold.  We then dissected out the lateral breast.  Since she had significant lymphadenopathy and bulky lymph  node disease still.  Some of this abutted up to the skin in the axilla.  I was able to dissect the breast in a medial to lateral fashion off the pectoralis fascia.  I was then able to enter the axilla.  We did an extensive lymph node dissection due to her bulky disease.  The long thoracic nerve was identified and preserved.  The thoracodorsal trunk was also identified and  preserved as was the axillary vein.  All lymphovascular tissue was excised.  This was taken en bloc with the breast specimen.  This was then passed off the field.  We assured hemostasis with cautery and Arista was placed throughout the cavity.  Through 2 separate stab incision 19 round drains were placed.  These were secured with 2-0 nylon.  The mastectomy wound was closed with a deep layer 3-0 Vicryl and 4 Monocryl for the skin.  In a similar fashion the right breast was approached.  Curvilinear incision was made above and below the nipple-areolar complex.  Probe was used to identify the mag trace and there is a very strong signal in the lower level 1 lymph node basin.  We then created a superior and inferior skin flap.  The superior skin flap was taken the clavicle.  The inferior skin flap was taken to the inframammary fold.  The breast was then dissected in a medial to lateral fashion off the pectoralis fashion using cautery for control of bleeding as well as clips as needed.  Once the lateral attachments were identified the breast was amputated there.  The extra contents were left in place.  Probe was used to identify the sentinel node which had a signal of over thousand with the mag trace in the central to upper axilla.  We did not dissect out there given her diagnosis of DCIS and also to try to reduce her risk of lymphedema on the right side.  Irrigation was used.  Hemostasis achieved with cautery and Arista was placed.  Through 2 separate stab incisions 19 round drains were placed.  These were secured to the skin with 2-0 nylon.  After ensuring hemostasis we closed the wound with 3-0 Vicryl for Monocryl.  Dermabond is applied.  All counts were found to be correct.  Breast binder placed.  The patient was then extubated awoken taken to recovery in satisfactory condition.

## 2021-01-31 NOTE — Transfer of Care (Signed)
Immediate Anesthesia Transfer of Care Note  Patient: Gabriela Sullivan  Procedure(s) Performed: LEFT MODIFIED RADICAL MASTECTOMY (Left: Breast) RIGHT SIMPLE MASTECTOMY (Right: Breast)  Patient Location: PACU  Anesthesia Type:General  Level of Consciousness: drowsy and patient cooperative  Airway & Oxygen Therapy: Patient Spontanous Breathing and Patient connected to nasal cannula oxygen  Post-op Assessment: Report given to RN  Post vital signs: Reviewed and stable  Last Vitals:  Vitals Value Taken Time  BP 130/74 01/31/21 1741  Temp    Pulse 91 01/31/21 1744  Resp 14 01/31/21 1744  SpO2 91 % 01/31/21 1744  Vitals shown include unvalidated device data.  Last Pain:  Vitals:   01/31/21 1254  TempSrc:   PainSc: 0-No pain         Complications: No notable events documented.

## 2021-01-31 NOTE — Anesthesia Procedure Notes (Signed)
Anesthesia Regional Block: Pectoralis block   Pre-Anesthetic Checklist: , timeout performed,  Correct Patient, Correct Site, Correct Laterality,  Correct Procedure, Correct Position, site marked,  Risks and benefits discussed,  Surgical consent,  Pre-op evaluation,  At surgeon's request and post-op pain management  Laterality: Right  Prep: chloraprep       Needles:  Injection technique: Single-shot  Needle Type: Echogenic Stimulator Needle     Needle Length: 10cm  Needle Gauge: 21     Additional Needles:   Procedures:,,,, ultrasound used (permanent image in chart),,    Narrative:  Start time: 01/31/2021 1:51 PM End time: 01/31/2021 2:01 PM Injection made incrementally with aspirations every 5 mL.  Performed by: Personally  Anesthesiologist: Duane Boston, MD

## 2021-01-31 NOTE — Anesthesia Postprocedure Evaluation (Signed)
Anesthesia Post Note  Patient: Gabriela Sullivan  Procedure(s) Performed: LEFT MODIFIED RADICAL MASTECTOMY (Left: Breast) RIGHT SIMPLE MASTECTOMY (Right: Breast)     Patient location during evaluation: PACU Anesthesia Type: Regional and General Level of consciousness: sedated Pain management: pain level controlled Vital Signs Assessment: post-procedure vital signs reviewed and stable Respiratory status: spontaneous breathing and respiratory function stable Cardiovascular status: stable Postop Assessment: no apparent nausea or vomiting Anesthetic complications: no   No notable events documented.  Last Vitals:  Vitals:   01/31/21 1935 01/31/21 2001  BP: 112/64 (!) 145/77  Pulse: 66 84  Resp: 17 17  Temp: 36.6 C 36.5 C  SpO2: 99% 98%    Last Pain:  Vitals:   01/31/21 2001  TempSrc: Oral  PainSc:                  Asiah Befort DANIEL

## 2021-02-01 ENCOUNTER — Ambulatory Visit: Payer: Medicaid Other | Admitting: Adult Health

## 2021-02-01 ENCOUNTER — Ambulatory Visit: Payer: Medicaid Other

## 2021-02-01 ENCOUNTER — Encounter (HOSPITAL_COMMUNITY): Payer: Self-pay | Admitting: Surgery

## 2021-02-01 ENCOUNTER — Other Ambulatory Visit: Payer: Medicaid Other

## 2021-02-01 DIAGNOSIS — C50912 Malignant neoplasm of unspecified site of left female breast: Secondary | ICD-10-CM | POA: Diagnosis not present

## 2021-02-01 LAB — CBC
HCT: 39.4 % (ref 36.0–46.0)
Hemoglobin: 13.9 g/dL (ref 12.0–15.0)
MCH: 28.7 pg (ref 26.0–34.0)
MCHC: 35.3 g/dL (ref 30.0–36.0)
MCV: 81.4 fL (ref 80.0–100.0)
Platelets: 343 10*3/uL (ref 150–400)
RBC: 4.84 MIL/uL (ref 3.87–5.11)
RDW: 11.8 % (ref 11.5–15.5)
WBC: 11.7 10*3/uL — ABNORMAL HIGH (ref 4.0–10.5)
nRBC: 0 % (ref 0.0–0.2)

## 2021-02-01 LAB — BASIC METABOLIC PANEL
Anion gap: 11 (ref 5–15)
BUN: 8 mg/dL (ref 6–20)
CO2: 23 mmol/L (ref 22–32)
Calcium: 9.5 mg/dL (ref 8.9–10.3)
Chloride: 103 mmol/L (ref 98–111)
Creatinine, Ser: 0.5 mg/dL (ref 0.44–1.00)
GFR, Estimated: >60 mL/min (ref >60)
Glucose, Bld: 158 mg/dL — ABNORMAL HIGH (ref 70–99)
Potassium: 3.5 mmol/L (ref 3.5–5.1)
Sodium: 137 mmol/L (ref 135–145)

## 2021-02-01 LAB — GLUCOSE, CAPILLARY: Glucose-Capillary: 114 mg/dL — ABNORMAL HIGH (ref 70–99)

## 2021-02-01 MED ORDER — OXYCODONE HCL 5 MG PO TABS: 5.0000 mg | ORAL_TABLET | Freq: Four times a day (QID) | ORAL | 0 refills | Status: DC | PRN

## 2021-02-01 MED ORDER — IBUPROFEN 800 MG PO TABS: 800.0000 mg | ORAL_TABLET | Freq: Three times a day (TID) | ORAL | 0 refills | Status: DC | PRN

## 2021-02-01 NOTE — Discharge Summary (Signed)
Physician Discharge Summary  Patient ID: Gabriela Sullivan MRN: [id number] DOB/AGE: 09-25-1990 30 y.o.  Admit date: 01/31/2021 Discharge date: 02/01/2021  Admission Diagnoses:left breast cancer right breast DCIS   Discharge Diagnoses:  Active Problems:   Left breast cancer with T3 tumor, >5 cm in greatest dimension [facility])  Right breast DCIS  Discharged Condition: good  Hospital Course:Pt did well after bilateral mastectomy, She had a modified radical on the left and a simple on the right.   She did well post  op tolerating her diet, ambulating and had good pain control.      Treatments: surgery: left modified radical mastectomy   right simple mastectomy   Discharge Exam: Blood pressure 113/82, pulse 91, temperature 98 F (36.7 C), temperature source Oral, resp. rate 18, height 5' (1.524 m), weight 86.2 kg, SpO2 95 %. General appearance: alert and cooperative Resp: clear to auscultation bilaterally Cardio: regular rate and rhythm, S1, S2 normal, no murmur, click, rub or gallop Incision/Wound:incisions CDI flaps viable no hematoma drain SS  Disposition: Discharge disposition: 01-Home or Self Care       Discharge Instructions     Diet - low sodium heart healthy   Complete by: As directed    Increase activity slowly   Complete by: As directed       Allergies as of 02/01/2021   No Known Allergies      Medication List     TAKE these medications    anastrozole 1 MG tablet Commonly known as: ARIMIDEX Take 1 tablet (1 mg total) by mouth daily.   calcium carbonate 500 MG chewable tablet Commonly known as: TUMS - dosed in mg elemental calcium Chew 2 tablets by mouth every other day.   cholecalciferol 25 MCG (1000 UNIT) tablet Commonly known as: VITAMIN D3 Take 1,000 Units by mouth daily.   ibuprofen 800 MG tablet Commonly known as: ADVIL Take 1 tablet (800 mg total) by mouth every 8 (eight) hours as needed.   metFORMIN 500 MG 24 hr tablet Commonly known as:  GLUCOPHAGE-XR Take 1,000 mg by mouth every evening.   oxyCODONE 5 MG immediate release tablet Commonly known as: Oxy IR/ROXICODONE Take 1 tablet (5 mg total) by mouth every 6 (six) hours as needed for severe pain.   venlafaxine XR 37.5 MG 24 hr capsule Commonly known as: EFFEXOR-XR Take 1 capsule (37.5 mg total) by mouth daily with breakfast.   Vitamin D (Ergocalciferol) 1.25 MG (50000 UNIT) Caps capsule Commonly known as: DRISDOL Take 50,000 Units by mouth every Monday.   ZOLADEX [place] Inject 1 Dose into the skin every 30 (thirty) days.         Signed: Turner Daniels MD  02/01/2021, 7:37 AM

## 2021-02-01 NOTE — Plan of Care (Signed)
  Problem: Education: Goal: Knowledge of General Education information will improve Description: Including pain rating scale, medication(s)/side effects and non-pharmacologic comfort measures 02/01/2021 0938 by Lurline Idol, RN Outcome: Adequate for Discharge 02/01/2021 [phone] by Lurline Idol, RN Outcome: Adequate for Discharge   Problem: Health Behavior/Discharge Planning: Goal: Ability to manage health-related needs will improve 02/01/2021 0938 by Lurline Idol, RN Outcome: Adequate for Discharge 02/01/2021 [phone] by Lurline Idol, RN Outcome: Adequate for Discharge   Problem: Clinical Measurements: Goal: Ability to maintain clinical measurements within normal limits will improve 02/01/2021 0938 by Lurline Idol, RN Outcome: Adequate for Discharge 02/01/2021 0938 by Lurline Idol, RN Outcome: Adequate for Discharge Goal: Will remain free from infection 02/01/2021 0938 by Lurline Idol, RN Outcome: Adequate for Discharge 02/01/2021 [phone] by Lurline Idol, RN Outcome: Adequate for Discharge Goal: Diagnostic test results will improve 02/01/2021 0938 by Lurline Idol, RN Outcome: Adequate for Discharge 02/01/2021 0938 by Lurline Idol, RN Outcome: Adequate for Discharge Goal: Respiratory complications will improve 02/01/2021 0938 by Lurline Idol, RN Outcome: Adequate for Discharge 02/01/2021 [phone] by Lurline Idol, RN Outcome: Adequate for Discharge Goal: Cardiovascular complication will be avoided 02/01/2021 0938 by Lurline Idol, RN Outcome: Adequate for Discharge 02/01/2021 [phone] by Lurline Idol, RN Outcome: Adequate for Discharge   Problem: Activity: Goal: Risk for activity intolerance will decrease 02/01/2021 0938 by Lurline Idol, RN Outcome: Adequate for Discharge 02/01/2021 [phone] by Lurline Idol, RN Outcome: Adequate for Discharge   Problem: Nutrition: Goal: Adequate nutrition will be maintained 02/01/2021 0938 by Lurline Idol, RN Outcome: Adequate for Discharge 02/01/2021 [phone]  by Lurline Idol, RN Outcome: Adequate for Discharge   Problem: Coping: Goal: Level of anxiety will decrease 02/01/2021 0938 by Lurline Idol, RN Outcome: Adequate for Discharge 02/01/2021 [phone] by Lurline Idol, RN Outcome: Adequate for Discharge   Problem: Elimination: Goal: Will not experience complications related to bowel motility 02/01/2021 0938 by Lurline Idol, RN Outcome: Adequate for Discharge 02/01/2021 0938 by Lurline Idol, RN Outcome: Adequate for Discharge Goal: Will not experience complications related to urinary retention 02/01/2021 0938 by Lurline Idol, RN Outcome: Adequate for Discharge 02/01/2021 [phone] by Lurline Idol, RN Outcome: Adequate for Discharge   Problem: Pain Managment: Goal: General experience of comfort will improve 02/01/2021 0938 by Lurline Idol, RN Outcome: Adequate for Discharge 02/01/2021 [phone] by Lurline Idol, RN Outcome: Adequate for Discharge   Problem: Safety: Goal: Ability to remain free from injury will improve 02/01/2021 0938 by Lurline Idol, RN Outcome: Adequate for Discharge 02/01/2021 [phone] by Lurline Idol, RN Outcome: Adequate for Discharge   Problem: Skin Integrity: Goal: Risk for impaired skin integrity will decrease 02/01/2021 0938 by Lurline Idol, RN Outcome: Adequate for Discharge 02/01/2021 0938 by Lurline Idol, RN Outcome: Adequate for Discharge

## 2021-02-01 NOTE — Progress Notes (Signed)
Patient given discharge instructions and stated understanding. 

## 2021-02-01 NOTE — Plan of Care (Signed)
  Problem: Education: Goal: Knowledge of General Education information will improve Description: Including pain rating scale, medication(s)/side effects and non-pharmacologic comfort measures Outcome: Progressing   Problem: Activity: Goal: Risk for activity intolerance will decrease Outcome: Progressing   Problem: Nutrition: Goal: Adequate nutrition will be maintained Outcome: Progressing   Problem: Elimination: Goal: Will not experience complications related to bowel motility Outcome: Progressing   Problem: Elimination: Goal: Will not experience complications related to urinary retention Outcome: Progressing

## 2021-02-06 ENCOUNTER — Encounter: Payer: Self-pay | Admitting: *Deleted

## 2021-02-07 ENCOUNTER — Other Ambulatory Visit: Payer: Self-pay | Admitting: Oncology

## 2021-02-08 ENCOUNTER — Encounter: Payer: Self-pay | Admitting: Nurse Practitioner

## 2021-02-09 ENCOUNTER — Other Ambulatory Visit: Payer: Self-pay

## 2021-02-09 ENCOUNTER — Inpatient Hospital Stay: Payer: Medicaid Other

## 2021-02-09 ENCOUNTER — Inpatient Hospital Stay (HOSPITAL_BASED_OUTPATIENT_CLINIC_OR_DEPARTMENT_OTHER): Payer: Medicaid Other | Admitting: Adult Health

## 2021-02-09 ENCOUNTER — Ambulatory Visit: Payer: Medicaid Other

## 2021-02-09 ENCOUNTER — Encounter: Payer: Self-pay | Admitting: *Deleted

## 2021-02-09 ENCOUNTER — Ambulatory Visit: Payer: Medicaid Other | Admitting: Adult Health

## 2021-02-09 ENCOUNTER — Inpatient Hospital Stay: Payer: Medicaid Other | Attending: Oncology

## 2021-02-09 ENCOUNTER — Other Ambulatory Visit: Payer: Medicaid Other

## 2021-02-09 VITALS — BP 146/71 | HR 85 | Temp 97.8°F | Resp 18 | Ht 60.0 in | Wt 188.2 lb

## 2021-02-09 DIAGNOSIS — Z95828 Presence of other vascular implants and grafts: Secondary | ICD-10-CM

## 2021-02-09 DIAGNOSIS — Z170 Estrogen receptor positive status [ER+]: Secondary | ICD-10-CM

## 2021-02-09 DIAGNOSIS — C50412 Malignant neoplasm of upper-outer quadrant of left female breast: Secondary | ICD-10-CM | POA: Insufficient documentation

## 2021-02-09 DIAGNOSIS — C7951 Secondary malignant neoplasm of bone: Secondary | ICD-10-CM | POA: Insufficient documentation

## 2021-02-09 DIAGNOSIS — Z79811 Long term (current) use of aromatase inhibitors: Secondary | ICD-10-CM | POA: Diagnosis not present

## 2021-02-09 LAB — CBC WITH DIFFERENTIAL/PLATELET
Abs Immature Granulocytes: 0.01 10*3/uL (ref 0.00–0.07)
Basophils Absolute: 0 10*3/uL (ref 0.0–0.1)
Basophils Relative: 0 %
Eosinophils Absolute: 0.1 10*3/uL (ref 0.0–0.5)
Eosinophils Relative: 2 %
HCT: 40.8 % (ref 36.0–46.0)
Hemoglobin: 14.3 g/dL (ref 12.0–15.0)
Immature Granulocytes: 0 %
Lymphocytes Relative: 27 %
Lymphs Abs: 1.7 10*3/uL (ref 0.7–4.0)
MCH: 28.8 pg (ref 26.0–34.0)
MCHC: 35 g/dL (ref 30.0–36.0)
MCV: 82.1 fL (ref 80.0–100.0)
Monocytes Absolute: 0.3 10*3/uL (ref 0.1–1.0)
Monocytes Relative: 5 %
Neutro Abs: 4.1 10*3/uL (ref 1.7–7.7)
Neutrophils Relative %: 66 %
Platelets: 346 10*3/uL (ref 150–400)
RBC: 4.97 MIL/uL (ref 3.87–5.11)
RDW: 12 % (ref 11.5–15.5)
WBC: 6.2 10*3/uL (ref 4.0–10.5)
nRBC: 0 % (ref 0.0–0.2)

## 2021-02-09 LAB — COMPREHENSIVE METABOLIC PANEL
ALT: 49 U/L — ABNORMAL HIGH (ref 0–44)
AST: 25 U/L (ref 15–41)
Albumin: 4 g/dL (ref 3.5–5.0)
Alkaline Phosphatase: 48 U/L (ref 38–126)
Anion gap: 10 (ref 5–15)
BUN: 14 mg/dL (ref 6–20)
CO2: 25 mmol/L (ref 22–32)
Calcium: 9.5 mg/dL (ref 8.9–10.3)
Chloride: 106 mmol/L (ref 98–111)
Creatinine, Ser: 0.6 mg/dL (ref 0.44–1.00)
GFR, Estimated: >60 mL/min (ref >60)
Glucose, Bld: 112 mg/dL — ABNORMAL HIGH (ref 70–99)
Potassium: 3.7 mmol/L (ref 3.5–5.1)
Sodium: 141 mmol/L (ref 135–145)
Total Bilirubin: 0.4 mg/dL (ref 0.3–1.2)
Total Protein: 7.2 g/dL (ref 6.5–8.1)

## 2021-02-09 MED ORDER — HEPARIN SOD (PORK) LOCK FLUSH 100 UNIT/ML IV SOLN
500.0000 [IU] | Freq: Once | INTRAVENOUS | Status: AC
  Administered 2021-02-09: 500 [IU]

## 2021-02-09 MED ORDER — ZOLEDRONIC ACID 4 MG/100ML IV SOLN
INTRAVENOUS | Status: AC
  Filled 2021-02-09: qty 100

## 2021-02-09 MED ORDER — GOSERELIN ACETATE 3.6 MG ~~LOC~~ IMPL
3.6000 mg | DRUG_IMPLANT | Freq: Once | SUBCUTANEOUS | Status: AC
  Administered 2021-02-09: 3.6 mg via SUBCUTANEOUS
  Filled 2021-02-09: qty 3.6

## 2021-02-09 MED ORDER — SODIUM CHLORIDE 0.9% FLUSH
10.0000 mL | Freq: Once | INTRAVENOUS | Status: AC
  Administered 2021-02-09: 10 mL

## 2021-02-09 MED ORDER — SODIUM CHLORIDE 0.9 % IV SOLN
Freq: Once | INTRAVENOUS | Status: AC
Start: 2021-02-09 — End: 2021-02-09

## 2021-02-09 MED ORDER — ZOLEDRONIC ACID 4 MG/100ML IV SOLN
4.0000 mg | Freq: Once | INTRAVENOUS | Status: AC
  Administered 2021-02-09: 4 mg via INTRAVENOUS

## 2021-02-09 NOTE — Patient Instructions (Signed)
[facility]  Discharge Instructions: Thank you for choosing [facility] to provide your oncology and hematology care.   If you have a lab appointment with the [facility], please go directly to the [facility] and check in at the registration area.   Wear comfortable clothing and clothing appropriate for easy access to any Portacath or PICC line.   We strive to give you quality time with your provider. You may need to reschedule your appointment if you arrive late (15 or more minutes).  Arriving late affects you and other patients whose appointments are after yours.  Also, if you miss three or more appointments without notifying the office, you may be dismissed from the clinic at the provider's discretion.      For prescription refill requests, have your pharmacy contact our office and allow 72 hours for refills to be completed.    Today you received the following chemotherapy and/or immunotherapy agents: Zometa & Zoladex   To help prevent nausea and vomiting after your treatment, we encourage you to take your nausea medication as directed.  BELOW ARE SYMPTOMS THAT SHOULD BE REPORTED IMMEDIATELY: *FEVER GREATER THAN 100.4 F (38 C) OR HIGHER *CHILLS OR SWEATING *NAUSEA AND VOMITING THAT IS NOT CONTROLLED WITH YOUR NAUSEA MEDICATION *UNUSUAL SHORTNESS OF BREATH *UNUSUAL BRUISING OR BLEEDING *URINARY PROBLEMS (pain or burning when urinating, or frequent urination) *BOWEL PROBLEMS (unusual diarrhea, constipation, pain near the anus) TENDERNESS IN MOUTH AND THROAT WITH OR WITHOUT PRESENCE OF ULCERS (sore throat, sores in mouth, or a toothache) UNUSUAL RASH, SWELLING OR PAIN  UNUSUAL VAGINAL DISCHARGE OR ITCHING   Items with * indicate a potential emergency and should be followed up as soon as possible or go to the Emergency Department if any problems should occur.  Please show the CHEMOTHERAPY ALERT CARD or IMMUNOTHERAPY ALERT CARD at check-in  to the Emergency Department and triage nurse.  Should you have questions after your visit or need to cancel or reschedule your appointment, please contact [facility]  Dept: [phone]  and follow the prompts.  Office hours are 8:00 a.m. to 4:30 p.m. Monday - Friday. Please note that voicemails left after 4:00 p.m. may not be returned until the following business day.  We are closed weekends and major holidays. You have access to a nurse at all times for urgent questions. Please call the main number to the clinic Dept: [phone] and follow the prompts.   For any non-urgent questions, you may also contact your provider using MyChart. We now offer e-Visits for anyone 31 and older to request care online for non-urgent symptoms. For details visit [email or web address].   Also download the MyChart app! Go to the app store, search "MyChart", open the app, select [facility], and log in with your MyChart username and password.  Due to Covid, a mask is required upon entering the hospital/clinic. If you do not have a mask, one will be given to you upon arrival. For doctor visits, patients may have 1 support person aged 31 or older with them. For treatment visits, patients cannot have anyone with them due to current Covid guidelines and our immunocompromised population.

## 2021-02-09 NOTE — Progress Notes (Signed)
[facility] OFFICE PROGRESS NOTE  Gabriela Orleans, MD [place] [address]  DIAGNOSIS: Inflammatory estrogen receptor positive breast cancer  CURRENT THERAPY: Goserelin, anastrozole; Zoledronate, adjuvant radiation therapy pending  INTERVAL HISTORY: Gabriela Sullivan is here today for f/u of her breast cancer unaccompanied.  She recently underwent bilateral mastectomies with left axillary lymph node dissection with Dr. Brantley Stage on 01/31/2021.  This revealed 2cm of residual invasive ductal carcinoma and 15/15 lymph nodes + for breast cancer.  She saw Dr. Brantley Stage in surgical follow up today and notes that he removed two of her drains, examined her, said she was healing well, and will see her back next week for f/u.    Gabriela Sullivan has continued to take the Anastrozole daily with good tolerance.  She also receives Goserelin every 4 weeks with good tolerance.  She is due for Goserelin and Zoledronate today.    Review of systems: Review of Systems  Constitutional:  Positive for fatigue. Negative for appetite change, chills, fever and unexpected weight change.  HENT:   Negative for hearing loss, lump/mass and trouble swallowing.   Eyes:  Negative for eye problems and icterus.  Respiratory:  Negative for chest tightness, cough and shortness of breath.   Cardiovascular:  Negative for chest pain, leg swelling and palpitations.  Gastrointestinal:  Negative for abdominal distention, abdominal pain, constipation, diarrhea, nausea and vomiting.  Endocrine: Negative for hot flashes.  Genitourinary:  Negative for difficulty urinating.   Musculoskeletal:  Negative for arthralgias.  Skin:  Negative for itching and rash.  Neurological:  Negative for dizziness, extremity weakness, headaches and numbness.  Hematological:  Negative for adenopathy. Does not bruise/bleed easily.  Psychiatric/Behavioral:  Negative for depression. The patient is not nervous/anxious.     HISTORY OF CURRENT  ILLNESS: From the original intake note:   Gabriela Sullivan noted a marble sized lump in her left breast in 2020.  She ignored it.  Around September 2021 this mass was "golf sized" and she brought it to her mother's attention.  She did not bring it to medical attention until several months later and she finally underwent bilateral diagnostic mammography with tomography and left breast ultrasonography at [facility] on 09/12/2020 showing: breast density category C; 5.1 cm ill-defined outer left breast mass corresponding to patient's palpable abnormality; at least 4 abnormal left axillary lymph nodes with cortical thickening; indeterminate 0.8 cm group of outer right breast calcifications; indeterminate 6 cm upper-outer left breast calcifications.   Accordingly on 09/26/2020 she proceeded to biopsy of the bilateral breast areas in question. The pathology from this procedure ([id number]) showed: 1. Left Breast, upper-outer             - invasive ductal carcinoma, grade 2.             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 Proliferation marker Ki67 at 10%. HER2 equivocal by immunohistochemistry (2+), but negative by fluorescent in situ hybridization with a signals ratio 1.61 and number per cell 2.65.   2. Right Breast, upper-outer             - low grade ductal carcinoma in situ             - Prognostic indicators significant for: estrogen receptor, >95% positive and  progesterone receptor, >95% positive, both with strong staining intensity.   She underwent additional biopsies on 09/26/2020. Pathology from the procedure ([id number]) showed: 1. Left Breast, 3 o'clock             - invasive ductal carcinoma, grade 2             - ductal carcinoma in situ             - Prognostic indicators significant for:  estrogen receptor, 60% positive with moderate staining intensity and progesterone receptor, 10% positive with strong staining intensity. Proliferation marker Ki67 at 15%. HER2 equivocal by immunohistochemistry (2+), but negative by fluorescent in situ hybridization with a signals ratio 1.66 and number per cell 2.65. 2. Left Axilla Lymph Node             - metastatic carcinoma   Cancer Staging Ductal carcinoma in situ (DCIS) of right breast Staging form: Breast, AJCC 8th Edition - Clinical: Stage 0 (cTis (DCIS), cN0, cM0) - Signed by Chauncey Cruel, MD on 10/04/2020   Malignant neoplasm of upper-outer quadrant of left breast in female, estrogen receptor positive ([place]) Staging form: Breast, AJCC 8th Edition - Clinical stage from 10/04/2020: Stage IIA (cT3, cN1(f), cM0, G2, ER+, PR+, HER2-) - Signed by Chauncey Cruel, MD on 10/04/2020 Stage prefix: Initial diagnosis Method of lymph node assessment: Core biopsy Histologic grading system: 3 grade system   The patient's subsequent history is as detailed below.     MEDICAL HISTORY: Past Medical History:  Diagnosis Date   Asthma    remote   Breast cancer ([place])    Elevated liver enzymes    Family history of breast cancer    Family history of ovarian cancer    Family history of prostate cancer    Family history of stomach cancer    Hepatic steatosis    Hyperglycemia    A1c 7.6% 12/19/20   Migraines    PCOS (polycystic ovarian syndrome)    Pre-diabetes     ALLERGIES:  has No Known Allergies.  MEDICATIONS:  Current Outpatient Medications  Medication Sig Dispense Refill   anastrozole (ARIMIDEX) 1 MG tablet Take 1 tablet (1 mg total) by mouth daily. 30 tablet 2   calcium carbonate (TUMS - DOSED IN MG ELEMENTAL CALCIUM) 500 MG chewable tablet Chew 2 tablets by mouth every other day.     cholecalciferol (VITAMIN D3) 25 MCG (1000 UNIT) tablet Take 1,000 Units by mouth daily.     Goserelin Acetate (ZOLADEX [place]) Inject 1 Dose into the  skin every 30 (thirty) days.     ibuprofen (ADVIL) 800 MG tablet Take 1 tablet (800 mg total) by mouth every 8 (eight) hours as needed. 30 tablet 0   metFORMIN (GLUCOPHAGE-XR) 500 MG 24 hr tablet Take 1,000 mg by mouth every evening.     oxyCODONE (OXY IR/ROXICODONE) 5 MG immediate release tablet Take 1 tablet (5 mg total) by mouth every 6 (six) hours as needed for severe pain. 20 tablet 0   venlafaxine XR (EFFEXOR-XR) 37.5 MG 24 hr capsule Take 1 capsule (37.5 mg total) by mouth daily with breakfast. 90 capsule 4   Vitamin D, Ergocalciferol, (DRISDOL) 50000 UNITS CAPS capsule Take 50,000 Units by mouth every Monday.     No current facility-administered medications for this visit.    SURGICAL HISTORY:  Past Surgical History:  Procedure Laterality Date   MASTECTOMY W/ SENTINEL NODE BIOPSY Right 01/31/2021   Procedure: RIGHT SIMPLE MASTECTOMY;  Surgeon: Brantley Stage,  Marcello Moores, MD;  Location: [facility];  Service: General;  Laterality: Right;   MODIFIED MASTECTOMY Left 01/31/2021   Procedure: LEFT MODIFIED RADICAL MASTECTOMY;  Surgeon: Erroll Luna, MD;  Location: [facility];  Service: General;  Laterality: Left;   NO PAST SURGERIES     PORTACATH PLACEMENT Right 10/18/2020   Procedure: INSERTION PORT-A-CATH;  Surgeon: Erroll Luna, MD;  Location: [facility];  Service: General;  Laterality: Right;     PHYSICAL EXAMINATION:  Blood pressure (!) 146/71, pulse 85, temperature 97.8 F (36.6 C), temperature source Temporal, resp. rate 18, height 5' (1.524 m), weight 188 lb 3.2 oz (85.4 kg), SpO2 100 %.  ECOG PERFORMANCE STATUS: 1 GENERAL: Patient is a well appearing female in no acute distress HEENT:  Sclerae anicteric.  Mask in place.. Neck is supple.  NODES:  No cervical, supraclavicular, or axillary lymphadenopathy palpated.  BREAST EXAM:  Deferred. LUNGS:  Clear to auscultation bilaterally.  No wheezes or rhonchi. HEART:  Regular rate and rhythm. No murmur appreciated. ABDOMEN:  Soft,  nontender.  Positive, normoactive bowel sounds. No organomegaly palpated. MSK:  No focal spinal tenderness to palpation. Full range of motion bilaterally in the upper extremities. EXTREMITIES:  No peripheral edema.   SKIN:  Clear with no obvious rashes or skin changes. No nail dyscrasia. NEURO:  Nonfocal. Well oriented.  Appropriate affect.   LABORATORY DATA: Lab Results  Component Value Date   WBC 6.2 02/09/2021   HGB 14.3 02/09/2021   HCT 40.8 02/09/2021   MCV 82.1 02/09/2021   PLT 346 02/09/2021      Chemistry      Component Value Date/Time   NA 141 02/09/2021 1138   K 3.7 02/09/2021 1138   CL 106 02/09/2021 1138   CO2 25 02/09/2021 1138   BUN 14 02/09/2021 1138   CREATININE 0.60 02/09/2021 1138   CREATININE 0.64 10/04/2020 0755      Component Value Date/Time   CALCIUM 9.5 02/09/2021 1138   ALKPHOS 48 02/09/2021 1138   AST 25 02/09/2021 1138   AST 47 (H) 10/04/2020 0755   ALT 49 (H) 02/09/2021 1138   ALT 108 (H) 10/04/2020 0755   BILITOT 0.4 02/09/2021 1138   BILITOT 0.7 10/04/2020 0755       RADIOGRAPHIC STUDIES:  NM Sentinel Node Inj-No Rpt (Breast)  Result Date: 01/31/2021 Sulfur Colloid was injected by the Nuclear Medicine Technologist for sentinel lymph node localization.     ASSESSMENT/PLAN:  ELIGIBLE FOR AVAILABLE RESEARCH PROTOCOL:   ASSESSMENT: 30 y.o. Gabriela [place] 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 as follows:             (0) status post left breast biopsies x2 and left axilla lymph node biopsy 09/26/2020 for a clinical T3 N1(f), stage IIa invasive ductal carcinoma, grade 2, estrogen and progesterone receptor positive, HER2 not amplified, with an MIB-1 in the 10-15% range             (a) right breast biopsy 09/25/2020 showed ductal carcinoma in situ, grade 1, estrogen and progesterone receptor positive             (b) breast MRI 10/06/2020 shows a 11 cm area of malignancy, with a second 1.9 cm mass, and skin thickening  concerning for inflammatory breast carcinoma; at least 6 abnormal lymph nodes noted             (c) chest CT scan 10/13/2020 shows clear lungs and no obvious liver involvement but multiple lytic  bone lesions in addition to the findings and MRI above             (d) bone scan 10/13/2020 shows nonspecific findings (lytic lesions not expected to be "hot")             (e) total spinal MRI 10/21/2020 shows 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  No cervical vertebral metastases noted.  There is also degenerative disc disease.   (1) genetics testing 11/25/2020 through the [facility] RNA[employer] panel found no deleterious mutations in AIP, ALK, APC, ATM, AXIN2, BAP1, BARD1, BLM, BMPR1A, BRCA1, BRCA2, BRIP1, CDC73, CDH1, CDK4, CDKN1B, CDKN2A, CHEK2, CTNNA1, DICER1, FANCC, FH, FLCN, GALNT12, KIF1B, LZTR1, MAX, MEN1, MET, MLH1, MSH2, MSH3, MSH6, MUTYH, NBN, NF1, NF2, NTHL1, PALB2, PHOX2B, PMS2, POT1, PRKAR1A, PTCH1, PTEN, RAD51C, RAD51D, RB1, RECQL, RET, SDHA, SDHAF2, SDHB, SDHC, SDHD, SMAD4, SMARCA4, SMARCB1, SMARCE1, STK11, SUFU, TMEM127, TP53, TSC1, TSC2, VHL and XRCC2 (sequencing and deletion/duplication); EGFR, EGLN1, HOXB13, KIT, MITF, PDGFRA, POLD1 and POLE (sequencing only); EPCAM and GREM1 (deletion/duplication only). RNA data is routinely analyzed for use in variant interpretation for all genes.   (2) neoadjuvant chemotherapy consisting of cyclophosphamide and doxorubicin in dose dense fashion x4 starting 10/24/2020, possibly followed by paclitaxel weekly x12             (a) echo 10/13/2020 showed an ejection fraction in the 60-65% range  (b) chemotherapy discontinued after two cycles of CA due to rise in LFTs  (c) liver MRI 11/30/2020 shows no metastatic disease, significant hepatic steatosis   (3)  definitive surgery:   (4) adjuvant radiation to the left side to follow   (5) anastrozole started 12/06/2020             (a) goserelin started 10/11/2020   (6) zolendronate started 11/02/2018. Every 12 weeks.    PLAN: Shamone is here today for follow up of her metastatic breast cancer.  She continues on Anastrozole, Goserelin, and Zolendronate with good tolerance.  She will continue this.  I reviewed her pathology with her in detail.  I reinforced the fact that unfortunately her breast cancer is metastatic to her spine which means it is uncurable and that our goal is to control the cancer from spreading elsewhere.    Abrial is healing well from her surgery.  She will continue to see Dr. Brantley Stage in follow-up.  I placed a referral back for her to see her radiation oncologist to discuss adjuvant radiation therapy.    Annalyn will return in 4 weeks for labs, f/u, and her next injection.  She knows to call for any questions that may arise between now and her next appointment.  We are happy to see her sooner if needed.  Total encounter time 30 minutes.*   Scot Dock, NP Medical Oncology and Hematology [facility] [address] [place], [place] [address] Tel. [phone]    Fax. [phone]  *Total Encounter Time as defined by the Centers for Medicare and Medicaid Services includes, in addition to the face-to-face time of a patient visit (documented in the note above) non-face-to-face time: obtaining and reviewing outside history, ordering and reviewing medications, tests or procedures, care coordination (communications with other health care professionals or caregivers) and documentation in the medical record.

## 2021-02-13 ENCOUNTER — Encounter: Payer: Self-pay | Admitting: Oncology

## 2021-02-13 ENCOUNTER — Encounter: Payer: Self-pay | Admitting: *Deleted

## 2021-02-17 LAB — SURGICAL PATHOLOGY

## 2021-02-22 ENCOUNTER — Other Ambulatory Visit: Payer: Self-pay

## 2021-02-22 ENCOUNTER — Encounter: Payer: Self-pay | Admitting: Physical Therapy

## 2021-02-22 ENCOUNTER — Ambulatory Visit: Payer: Medicaid Other | Attending: Surgery | Admitting: Physical Therapy

## 2021-02-22 DIAGNOSIS — D0511 Intraductal carcinoma in situ of right breast: Secondary | ICD-10-CM | POA: Insufficient documentation

## 2021-02-22 DIAGNOSIS — R293 Abnormal posture: Secondary | ICD-10-CM | POA: Diagnosis present

## 2021-02-22 DIAGNOSIS — M25611 Stiffness of right shoulder, not elsewhere classified: Secondary | ICD-10-CM | POA: Diagnosis present

## 2021-02-22 DIAGNOSIS — C50412 Malignant neoplasm of upper-outer quadrant of left female breast: Secondary | ICD-10-CM | POA: Diagnosis present

## 2021-02-22 DIAGNOSIS — Z170 Estrogen receptor positive status [ER+]: Secondary | ICD-10-CM | POA: Insufficient documentation

## 2021-02-22 DIAGNOSIS — M25612 Stiffness of left shoulder, not elsewhere classified: Secondary | ICD-10-CM

## 2021-02-22 NOTE — Patient Instructions (Addendum)
            [facility]         [address], [place] [address]         [phone]         Annia Friendly, PT, CLT   After Breast Cancer Class It is recommended you attend the ABC class to be educated on lymphedema risk reduction. This class is free of charge and lasts for 1 hour. It is a 1-time class. You are scheduled for November 7th at 11:00. We will send you a link but you need to download the Webex app.  Scar massage You are not ready to begin scar massage until you are more healed.  Compression garment Continue wearing the bra like you have on today.  Home exercise Program You can continue working on your exercises I gave you until you begin PT with [place].  Follow up PT: It is recommended you return every 3 months for the first 3 years following surgery to be assessed on the SOZO machine for an L-Dex score. This helps prevent clinically significant lymphedema in 95% of patients. These follow up screens are 10 minute appointments that you are not billed for.  APPOINTMENTS FOR SOZO SCREENS AFTER 03/05/2021 WILL BE LOCATED AT [facility] AT [address] [place] [address]. The phone number is [phone]. You are scheduled for December 12th at 8:00.

## 2021-02-22 NOTE — Therapy (Signed)
[place], [place], [address] Phone: [phone]   Fax:  [phone]  Physical Therapy Treatment  Patient Details  Name: Gabriela Sullivan MRN: [id number] Date of Birth: 1990/10/05 Referring Provider (PT): Dr. Erroll Luna   Encounter Date: 02/22/2021   PT End of Session - 02/22/21 1043     Visit Number 2    Number of Visits 10    Date for PT Re-Evaluation 03/22/21    PT Start Time 1000    PT Stop Time 1045    PT Time Calculation (min) 45 min    Activity Tolerance Patient tolerated treatment well    Behavior During Therapy [facility] for tasks assessed/performed             Past Medical History:  Diagnosis Date   Asthma    remote   Breast cancer ([place])    Elevated liver enzymes    Family history of breast cancer    Family history of ovarian cancer    Family history of prostate cancer    Family history of stomach cancer    Hepatic steatosis    Hyperglycemia    A1c 7.6% 12/19/20   Migraines    PCOS (polycystic ovarian syndrome)    Pre-diabetes     Past Surgical History:  Procedure Laterality Date   MASTECTOMY W/ SENTINEL NODE BIOPSY Right 01/31/2021   Procedure: RIGHT SIMPLE MASTECTOMY;  Surgeon: Erroll Luna, MD;  Location: [place];  Service: General;  Laterality: Right;   MODIFIED MASTECTOMY Left 01/31/2021   Procedure: LEFT MODIFIED RADICAL MASTECTOMY;  Surgeon: Erroll Luna, MD;  Location: [place];  Service: General;  Laterality: Left;   NO PAST SURGERIES     PORTACATH PLACEMENT Right 10/18/2020   Procedure: INSERTION PORT-A-CATH;  Surgeon: Erroll Luna, MD;  Location: [place];  Service: General;  Laterality: Right;    There were no vitals filed for this visit.   Subjective Assessment - 02/22/21 1005     Subjective Patient underwent neoadjuvant chemotherapy beginning 10/24/2020 for 2 rounds which was stopped due to elevated LFTs. She had a bilateral mastectomy with left  axillary node dissection on 01/31/2021 with 15/15 nodes positive. She started Anastrozole on 12/06/2020. 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 Her final drain was removed 02/16/2021.    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 It is ER/PR positive and HER2 negative with a Ki67 of 15%. She underwent neoadjuvant chemotherapy beginning 10/24/2020 for 2 rounds which was stopped due to elevated LFTs. She had a bilateral mastectomy with left axillary node dissection on 01/31/2021 with 15/15 nodes positive. She started Anastrozole on 12/06/2020. 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    Patient Stated Goals Get my arms back to normal    Currently in Pain? Yes    Pain Score 2     Pain Location Chest    Pain Orientation Right;Left    Pain Descriptors / Indicators Other (Comment)   Phantom pain   Pain Type Surgical pain;Neuropathic pain    Pain Onset 1 to 4 weeks ago    Pain Frequency Intermittent    Aggravating Factors  Nothing    Pain Relieving Factors Nothing                [facility] PT Assessment - 02/22/21 0001  Assessment   Medical Diagnosis s/p bil mastectomy and left ALND    Referring Provider (PT) Dr. Marcello Moores [place]    Onset Date/Surgical Date 01/31/21    Hand Dominance Right    Prior Therapy Baselines      Precautions   Precautions Other (comment)    Precaution Comments recent surgery and left arm lymphedema risk      Restrictions   Weight Bearing Restrictions No      Balance Screen   Has the patient fallen in the past 6 months No    Has the patient had a decrease in activity level because of a fear of falling?  No    Is the patient reluctant to leave their home because of a fear of falling?  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 They still have glue present and are not fully healed but no redness or concerns noted. Mild edema present. issued knitted knockers to be worn over ABD pads in bra.      Posture/Postural Control   Posture/Postural Control Postural limitations    Postural Limitations Rounded Shoulders;Forward head      ROM / Strength   AROM / PROM / Strength AROM      AROM   AROM Assessment Site Shoulder    Right/Left Shoulder Right;Left    Right Shoulder Extension 39 Degrees    Right Shoulder Flexion 110 Degrees    Right Shoulder ABduction 128 Degrees    Left Shoulder Flexion 42 Degrees    Left Shoulder ABduction 98 Degrees    Left Shoulder Internal Rotation 64 Degrees      Strength   Overall Strength Unable to assess;Due to precautions               LYMPHEDEMA/ONCOLOGY QUESTIONNAIRE - 02/22/21 0001       Type   Cancer Type Bilateral breast cancer      Surgeries   Mastectomy Date 01/31/21    Axillary Lymph Node Dissection Date 01/31/21    Number Lymph Nodes Removed 15   On left     Treatment   Active Chemotherapy Treatment No    Past Chemotherapy Treatment Yes    Date 10/24/20    Active Radiation Treatment No    Past Radiation Treatment No    Current Hormone Treatment Yes    Date 12/06/20    Drug Name Anastrozole    Past Hormone Therapy No      What other symptoms do you have   Are you Having Heaviness or Tightness Yes    Are you having Pain Yes    Are you having pitting edema No    Is it Hard or Difficult finding clothes that fit No    Do you  have infections No    [place] Sign No      Lymphedema Assessments   Lymphedema Assessments Upper extremities      Right Upper Extremity Lymphedema  10 cm Proximal to Olecranon Process 33.1 cm    Olecranon Process 26.4 cm    10 cm Proximal to Ulnar Styloid Process 24.5 cm    Just Proximal to Ulnar Styloid Process 16.9 cm    Across Hand at [employer] 20.3 cm    At [place] of 2nd Digit 7 cm      Left Upper Extremity Lymphedema   10 cm Proximal to Olecranon Process 32.9 cm    Olecranon Process 26.4 cm    10 cm Proximal to Ulnar Styloid Process 24.1 cm    Just Proximal to Ulnar Styloid Process 16.8 cm    Across Hand at [employer] 20 cm    At [place] of 2nd Digit 7 cm                Quick Dash - 02/22/21 0001     Open a tight or new jar Mild difficulty    Do heavy household chores (wash walls, wash floors) Mild difficulty    Carry a shopping bag or briefcase Mild difficulty    Wash your back Moderate difficulty    Use a knife to cut food No difficulty    Recreational activities in which you take some force or impact through your arm, shoulder, or hand (golf, hammering, tennis) Mild difficulty    During the past week, to what extent has your arm, shoulder or hand problem interfered with your normal social activities with family, friends, neighbors, or groups? Slightly    During the past week, to what extent has your arm, shoulder or hand problem limited your work or other regular daily activities Slightly    Arm, shoulder, or hand pain. None    Tingling (pins and needles) in your arm, shoulder, or hand Mild    Difficulty Sleeping Mild difficulty    DASH Score 22.73 %                             PT Education - 02/22/21 1042     Education Details Aftercare; lymphedema risk    Person(s) Educated Patient    Methods Explanation;Demonstration;Handout    Comprehension Returned demonstration;Verbalized understanding                 PT Long Term  Goals - 02/22/21 1055       PT LONG TERM GOAL #1   Title Patient will be independent in her HEP for improving shoulder ROM and function.    Baseline No knowledge    Time 4    Period Weeks    Status New    Target Date 03/22/21      PT LONG TERM GOAL #2   Title Patient will increase bilateral shoulder flexion AROM to >/= 146 degrees for increased ease reaching.    Baseline Right shoulder 110 degrees post op; 153 pre-op; left shoulder 98 degrees post op; 146 pre-op    Time 4    Period Weeks    Status New    Target Date 03/22/21      PT LONG TERM GOAL #3   Title Patient will increase bilateral shoulder abduction AROM to >/= 150 degrees for increased ease reaching.    Baseline Right shoulder 157 degrees pre-op and 128 post op; left shoulder 152 degrees pre-op and 64 degrees post op    Time 4    Period Weeks    Status New    Target Date 03/22/21  PT LONG TERM GOAL #4   Title Patient will improve her DASH score for increased overall upper extremity function to </= 11.36    Baseline 11.36 pre-op and 22.73 post op    Time 4    Period Weeks    Status New    Target Date 03/22/21      PT LONG TERM GOAL #5   Title Patient will verbalize good understanding of lymphedema risk reduction practices.    Baseline No knowledge    Time 4    Period Weeks    Status New    Target Date 03/22/21                   Plan - 02/22/21 1048     Clinical Impression Statement Patient is doing very well s/p neoadjuvant chemotherapy and a bilateral mastectomy with a left axillary node dissection on 01/31/2021. She had 15 of 15 axillary nodes positive. She is lacking shoulder range of motion bilaterally and has numbness and dysfunction in her left upper arm and shoulder. She is planning to have radiation beginning next week. She was also diagnosed with thoracic and lumbar spinal metastases which is being treated systemically. She will benefit from PT to help regain her shoulder ROM and function  and help restore left shoulder sensation and strength.    PT Frequency 2x / week    PT Duration 4 weeks    PT Treatment/Interventions ADLs/Self Care Home Management;Therapeutic exercise;Manual techniques;Manual lymph drainage;Patient/family education;Scar mobilization;Passive range of motion    PT Next Visit Plan Begin PROM bilateral shoulders and AAROM exercises    PT Home Exercise Plan Post op shoulder ROM HEP    Consulted and Agree with Plan of Care Patient             Patient will benefit from skilled therapeutic intervention in order to improve the following deficits and impairments:  Postural dysfunction, Decreased range of motion, Decreased knowledge of precautions, Impaired UE functional use, Pain, Increased fascial restricitons, Decreased strength, Increased edema, Decreased scar mobility, Decreased skin integrity  Visit Diagnosis: Ductal carcinoma in situ (DCIS) of right breast - Plan: PT plan of care cert/re-cert  Malignant neoplasm of upper-outer quadrant of left breast in female, estrogen receptor positive ([place]) - Plan: PT plan of care cert/re-cert  Abnormal posture - Plan: PT plan of care cert/re-cert  Stiffness of left shoulder, not elsewhere classified - Plan: PT plan of care cert/re-cert  Stiffness of right shoulder, not elsewhere classified - Plan: PT plan of care cert/re-cert     Problem List Patient Active Problem List   Diagnosis Date Noted   Left breast cancer with T3 tumor, >5 cm in greatest dimension ([place]) 01/31/2021   Bone metastases ([place]) 12/28/2020   Port-A-Cath in place 12/06/2020   Genetic testing 11/25/2020   Goals of care, counseling/discussion 10/25/2020   Lytic bone lesions on xray 10/16/2020   Family history of ovarian cancer    Family history of prostate cancer    Family history of breast cancer    Family history of stomach cancer    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   Malignant neoplasm of upper-outer quadrant of  left breast in female, estrogen receptor positive ([place]) 09/29/2020   Elevated LFTs    Annia Friendly, PT 02/22/21 11:04 AM   [place], [place], [address] Phone: [phone]   Fax:  [phone]  Name: Courtne Lighty MRN: [id number] Date  of Birth: [birth year]

## 2021-02-26 ENCOUNTER — Other Ambulatory Visit: Payer: Self-pay

## 2021-02-26 ENCOUNTER — Emergency Department (HOSPITAL_COMMUNITY)
Admission: EM | Admit: 2021-02-26 | Discharge: 2021-02-27 | Disposition: A | Discharge status: Home / Self Care | Payer: Medicaid Other | Attending: Emergency Medicine | Admitting: Emergency Medicine

## 2021-02-26 ENCOUNTER — Telehealth: Payer: Self-pay

## 2021-02-26 DIAGNOSIS — T8189XA Other complications of procedures, not elsewhere classified, initial encounter: Secondary | ICD-10-CM | POA: Diagnosis not present

## 2021-02-26 DIAGNOSIS — Z5321 Procedure and treatment not carried out due to patient leaving prior to being seen by health care provider: Secondary | ICD-10-CM | POA: Insufficient documentation

## 2021-02-26 DIAGNOSIS — X58XXXA Exposure to other specified factors, initial encounter: Secondary | ICD-10-CM | POA: Insufficient documentation

## 2021-02-26 NOTE — Telephone Encounter (Signed)
Called patient to inquire about current pregnancy status (Per [place]) and patient states "no sexual activity since her surgery & no chance of pregnancy".

## 2021-02-27 ENCOUNTER — Other Ambulatory Visit: Payer: Self-pay

## 2021-02-27 ENCOUNTER — Encounter (HOSPITAL_COMMUNITY): Payer: Self-pay | Admitting: Emergency Medicine

## 2021-02-27 LAB — CBC
HCT: 41.8 % (ref 36.0–46.0)
Hemoglobin: 14.3 g/dL (ref 12.0–15.0)
MCH: 28.5 pg (ref 26.0–34.0)
MCHC: 34.2 g/dL (ref 30.0–36.0)
MCV: 83.4 fL (ref 80.0–100.0)
Platelets: 355 10*3/uL (ref 150–400)
RBC: 5.01 MIL/uL (ref 3.87–5.11)
RDW: 12.5 % (ref 11.5–15.5)
WBC: 6 10*3/uL (ref 4.0–10.5)
nRBC: 0 % (ref 0.0–0.2)

## 2021-02-27 NOTE — ED Provider Notes (Signed)
Emergency Medicine Provider Triage Evaluation Note  Gabriela Sullivan , a 30 y.o. female  was evaluated in triage.  Pt complains of bleeding and purulence from her surgical incision site. Hx bilateral mastectomies with left axillary lymph node dissection with Dr. Brantley Stage on 01/31/2021. States that bleeding started tonight. She also noted a scant amount of yellow fluid that resembled pus. Came to ensure there were no concerning signs of infection. Denies fevers.  Review of Systems  Positive: Drainage from wound Negative: Fevers  Physical Exam  BP 139/84 (BP Location: Right Arm)   Pulse 85   Temp 98.8 F (37.1 C)   Resp 18   Ht 5' (1.524 m)   Wt 80 kg   SpO2 97%   BMI 34.44 kg/m  Gen:   Awake, no distress   Resp:  Normal effort  MSK:   Moves extremities without difficulty  Other:  0.5cm area along surgical incision site appears recently dehisced, but remains well approximated; sides of incision site remain close. No active drainage. No significant warmth or induration.  Medical Decision Making  Medically screening exam initiated at 12:05 AM.  Appropriate orders placed.  Gabriela Sullivan was informed that the remainder of the evaluation will be completed by another provider, this initial triage assessment does not replace that evaluation, and the importance of remaining in the ED until their evaluation is complete.  Postoperative wound check   Antonietta Breach, Hershal Coria 02/27/21 0008    Merryl Hacker, MD 02/27/21 [phone]

## 2021-02-27 NOTE — ED Triage Notes (Signed)
Patient noticed mild bleeding at incision site of left breast this evening .

## 2021-02-27 NOTE — ED Notes (Signed)
Patient states her surgery center is open and she will go there

## 2021-02-28 ENCOUNTER — Ambulatory Visit: Payer: Medicaid Other | Admitting: Radiation Oncology

## 2021-02-28 ENCOUNTER — Encounter: Payer: Self-pay | Admitting: Radiation Oncology

## 2021-02-28 ENCOUNTER — Ambulatory Visit
Admission: RE | Admit: 2021-02-28 | Discharge: 2021-02-28 | Disposition: A | Discharge status: Home / Self Care | Payer: Medicaid Other | Source: Ambulatory Visit | Attending: Radiation Oncology | Admitting: Radiation Oncology

## 2021-02-28 ENCOUNTER — Ambulatory Visit: Payer: Self-pay

## 2021-02-28 VITALS — BP 132/88 | HR 80 | Temp 97.0°F | Resp 20 | Wt 187.2 lb

## 2021-02-28 DIAGNOSIS — R739 Hyperglycemia, unspecified: Secondary | ICD-10-CM | POA: Insufficient documentation

## 2021-02-28 DIAGNOSIS — Z7984 Long term (current) use of oral hypoglycemic drugs: Secondary | ICD-10-CM | POA: Insufficient documentation

## 2021-02-28 DIAGNOSIS — Z79899 Other long term (current) drug therapy: Secondary | ICD-10-CM | POA: Diagnosis not present

## 2021-02-28 DIAGNOSIS — Z8041 Family history of malignant neoplasm of ovary: Secondary | ICD-10-CM | POA: Diagnosis not present

## 2021-02-28 DIAGNOSIS — C50412 Malignant neoplasm of upper-outer quadrant of left female breast: Secondary | ICD-10-CM

## 2021-02-28 DIAGNOSIS — Z8042 Family history of malignant neoplasm of prostate: Secondary | ICD-10-CM | POA: Insufficient documentation

## 2021-02-28 DIAGNOSIS — Z170 Estrogen receptor positive status [ER+]: Secondary | ICD-10-CM

## 2021-02-28 DIAGNOSIS — Z800 Family history of malignant neoplasm of digestive organs: Secondary | ICD-10-CM | POA: Insufficient documentation

## 2021-02-28 DIAGNOSIS — K760 Fatty (change of) liver, not elsewhere classified: Secondary | ICD-10-CM | POA: Diagnosis not present

## 2021-02-28 DIAGNOSIS — D0511 Intraductal carcinoma in situ of right breast: Secondary | ICD-10-CM

## 2021-02-28 DIAGNOSIS — Z803 Family history of malignant neoplasm of breast: Secondary | ICD-10-CM | POA: Insufficient documentation

## 2021-02-28 NOTE — Progress Notes (Signed)
Radiation Oncology         [phone]) [phone] ________________________________  Name: Gabriela Sullivan        MRN: [id number]  Date of Service: 02/28/2021 DOB: [birth year]  CC:[name], MD  Magrinat, Virgie Dad, MD     REFERRING PHYSICIAN: Magrinat, Virgie Dad, MD   DIAGNOSIS: The primary encounter diagnosis was Ductal carcinoma in situ (DCIS) of right breast. A diagnosis of Malignant neoplasm of upper-outer quadrant of left breast in female, estrogen receptor positive ([place]) was also pertinent to this visit.   HISTORY OF PRESENT ILLNESS: Gabriela Sullivan is a 30 y.o. female originally seen in the multidisciplinary breast clinic for a new diagnosis of bilateral breast cancer. The patient was noted to have a palpable mass in the left breast as well as calcifications detected during her diagnostic work-up in the right breast.  The calcifications in the left breast were noted as well in addition to the palpable mass.  The mass was located at 3:00 and measured 5.1 cm, calcifications in the left breast around this measured up to 6 cm, and she had at least 4 abnormal appearing left axillary lymph nodes.  Her right breast showed a grouping of calcifications measuring 8 mm.  She underwent biopsies of the breast.  Biopsies on 09/25/2020 in the right breast showed low-grade DCIS, and her tumor was ER/PR positive.  A left breast biopsy was also performed in the upper outer quadrant that day which revealed a grade 2 invasive ductal carcinoma with associated DCIS calcifications and focal necrosis, this tumor was ER/PR positive, HER2 was negative and a Ki-67 was 10%. She returned for additional biopsy on 09/26/2020 of the left breast and axilla, the 3:00 biopsy in the left breast revealed a grade 2 invasive ductal carcinoma with associated DCIS, the prognostic markers were similar with a ER/PR positivity, HER2 was negative and Ki-67 was 15%.  Her left axillary lymph node biopsy was consistent with metastatic disease.   Since her  last visit she had an MRI of the breasts on 10/06/2020 which showed extensive non-mass enhancement up to 11 cm in greatest dimension of the left breast additional abnormal enhancement of the inferior anterior left breast was concerning for a second focus of disease.  Skin thickening of the left breast was also identified.  Multiple left axillary lymph nodes were seen 6 of which were enlarged based on size or cortical thickness one of them was biopsy-proven prior to this.  There was also an 8 mm area of enhancement that reflected her DCIS in the right breast.  She had 6 staging scans which showed concerns for possible disease in the sternum and T11 by CT but these were not well visualized on bone scan, she underwent total MRI screening of the spine on 10/21/2020 widely scattered thoracic and lumbar vertebral body metastatic changes were seen generally small with no extraosseous or epidural extension confluence sacral metastasis from S2 to the right sacral ala were noted.  An MRI of her liver was also performed showing no evidence of disease.  She proceeded with systemic chemotherapy between 10/25/2020 and 11/08/2020 but this had to be discontinued due to rising liver function testing.  She was started on anastrozole and goserelin as well as underwent left MRM and right simple mastectomy.  Final pathology from this procedure on 01/31/2021 showed no residual DCIS in the right breast specimen in the left breast multifocal invasive ductal carcinoma was seen that was grade 2 the largest focus was 2 cm with  associated intermediate grade DCIS.  Her margins were negative, she had 15 lymph nodes that were sampled and were all positive for disease with extracapsular extension.  Her prognostics were repeated and were still ER and PR positive HER2 negative with a Ki 67 of 15%.  She is seen to discuss left postmastectomy radiotherapy.    PREVIOUS RADIATION THERAPY: No   PAST MEDICAL HISTORY:  Past Medical History:  Diagnosis  Date   Asthma    remote   Breast cancer ([place])    Elevated liver enzymes    Family history of breast cancer    Family history of ovarian cancer    Family history of prostate cancer    Family history of stomach cancer    Hepatic steatosis    Hyperglycemia    A1c 7.6% 12/19/20   Migraines    PCOS (polycystic ovarian syndrome)    Pre-diabetes        PAST SURGICAL HISTORY: Past Surgical History:  Procedure Laterality Date   MASTECTOMY W/ SENTINEL NODE BIOPSY Right 01/31/2021   Procedure: RIGHT SIMPLE MASTECTOMY;  Surgeon: Erroll Luna, MD;  Location: [facility];  Service: General;  Laterality: Right;   MODIFIED MASTECTOMY Left 01/31/2021   Procedure: LEFT MODIFIED RADICAL MASTECTOMY;  Surgeon: Erroll Luna, MD;  Location: [facility];  Service: General;  Laterality: Left;   NO PAST SURGERIES     PORTACATH PLACEMENT Right 10/18/2020   Procedure: INSERTION PORT-A-CATH;  Surgeon: Erroll Luna, MD;  Location: [facility];  Service: General;  Laterality: Right;     FAMILY HISTORY:  Family History  Problem Relation Age of Onset   Ovarian cancer Maternal Grandmother        unknown age of diagnosis   Prostate cancer Maternal Grandfather        metastatic   Breast cancer Cousin        unknown age of diagnosis, maternal first cousin   Stomach cancer Cousin        dx 20s/30s, maternal first cousin     SOCIAL HISTORY:  reports that she has never smoked. She has never used smokeless tobacco. She reports that she does not drink alcohol and does not use drugs.  The patient is single.  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   ALLERGIES: Patient has no known allergies.   MEDICATIONS:  Current Outpatient Medications  Medication Sig Dispense Refill   anastrozole (ARIMIDEX) 1 MG tablet Take 1 tablet (1 mg total) by mouth daily. 30 tablet 2   calcium carbonate (TUMS - DOSED IN MG  ELEMENTAL CALCIUM) 500 MG chewable tablet Chew 2 tablets by mouth every other day.     cholecalciferol (VITAMIN D3) 25 MCG (1000 UNIT) tablet Take 1,000 Units by mouth daily.     Goserelin Acetate (ZOLADEX [place]) Inject 1 Dose into the skin every 30 (thirty) days.     ibuprofen (ADVIL) 800 MG tablet Take 1 tablet (800 mg total) by mouth every 8 (eight) hours as needed. 30 tablet 0   metFORMIN (GLUCOPHAGE-XR) 500 MG 24 hr tablet Take 1,000 mg by mouth every evening.     oxyCODONE (OXY IR/ROXICODONE) 5 MG immediate release tablet Take 1 tablet (5 mg total) by mouth every 6 (six) hours as needed for severe pain. 20 tablet 0   venlafaxine XR (EFFEXOR-XR) 37.5 MG 24 hr capsule Take 1 capsule (37.5 mg total) by mouth daily with breakfast.  90 capsule 4   Vitamin D, Ergocalciferol, (DRISDOL) 50000 UNITS CAPS capsule Take 50,000 Units by mouth every Monday.     No current facility-administered medications for this encounter.     REVIEW OF SYSTEMS: On review of systems, the patient reports that she is doing pretty well. Her range of motion is better on the right than on the left, but she is still doing very well. She reports she is not having any pain in her chest wall and plans to consider reconstruction months down the road.      PHYSICAL EXAM:  Wt Readings from Last 3 Encounters:  02/27/21 176 lb 5.9 oz (80 kg)  02/09/21 188 lb 3.2 oz (85.4 kg)  01/31/21 190 lb (86.2 kg)   Temp Readings from Last 3 Encounters:  02/27/21 98.7 F (37.1 C)  02/09/21 97.8 F (36.6 C) (Temporal)  02/01/21 97.6 F (36.4 C) (Oral)   BP Readings from Last 3 Encounters:  02/27/21 (!) 142/87  02/09/21 (!) 146/71  02/01/21 135/83   Pulse Readings from Last 3 Encounters:  02/27/21 81  02/09/21 85  02/01/21 87    In general this is a well appearing hispanic female in no acute distress. She's alert and oriented x4 and appropriate throughout the examination. Cardiopulmonary assessment is negative for acute distress  and she exhibits normal effort. The left mastectomy incision site is well healed. Her right mastectomy is also well healed. No separation, erythema, or drainage is noted of either site.     ECOG = 1  0 - Asymptomatic (Fully active, able to carry on all predisease activities without restriction)  1 - Symptomatic but completely ambulatory (Restricted in physically strenuous activity but ambulatory and able to carry out work of a light or sedentary nature. For example, light housework, office work)  2 - Symptomatic, <50% in bed during the day (Ambulatory and capable of all self care but unable to carry out any work activities. Up and about more than 50% of waking hours)  3 - Symptomatic, >50% in bed, but not bedbound (Capable of only limited self-care, confined to bed or chair 50% or more of waking hours)  4 - Bedbound (Completely disabled. Cannot carry on any self-care. Totally confined to bed or chair)  5 - Death   Eustace Pen MM, Creech RH, Tormey DC, et al. [phone]). "Toxicity and response criteria of the [facility]". Nunda Oncol. 5 (6): 649-55    LABORATORY DATA:  Lab Results  Component Value Date   WBC 6.0 02/27/2021   HGB 14.3 02/27/2021   HCT 41.8 02/27/2021   MCV 83.4 02/27/2021   PLT 355 02/27/2021   Lab Results  Component Value Date   NA 141 02/09/2021   K 3.7 02/09/2021   CL 106 02/09/2021   CO2 25 02/09/2021   Lab Results  Component Value Date   ALT 49 (H) 02/09/2021   AST 25 02/09/2021   ALKPHOS 48 02/09/2021   BILITOT 0.4 02/09/2021      RADIOGRAPHY: NM Sentinel Node Inj-No Rpt (Breast)  Result Date: 01/31/2021 Sulfur Colloid was injected by the Nuclear Medicine Technologist for sentinel lymph node localization.        IMPRESSION/PLAN: 1. 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 Dr. Lisbeth Renshaw discusses the patient's  course since our last visit as well as the pathology findings. He reviews that she would also  benefit from external radiotherapy to the left chest wall and regional nodes  to reduce risks of local recurrence followed by antiestrogen therapy. We discussed the risks, benefits, short, and long term effects of radiotherapy, as well as the curative intent, and the patient is interested in proceeding. Dr. Lisbeth Renshaw discusses the delivery and logistics of radiotherapy and anticipates a course of 6 1/2 weeks of radiotherapy to the left chest wall and regional nodes with deep inspiration breath hold technique. Written consent is obtained and placed in the chart, a copy was provided to the patient. She will simulate today. 3. Contraceptive Counseling. The patient has PCOS and has never been pregnant. She's not currently sexually active but is aware of the need to use condoms during treatment and for pregnancy testing prior to treatment with radiotherapy.Since she is not active currently and has not been since her negative pregnancy test, no testing prior to radiotherapy simulation is necessary.  In a visit lasting 60 minutes, greater than 50% of the time was spent face to face reviewing her case, as well as in preparation of, discussing, and coordinating the patient's care.  The above documentation reflects my direct findings during this shared patient visit. Please see the separate note by Dr. Lisbeth Renshaw on this date for the remainder of the patient's plan of care.    Carola Rhine, [facility]    **Disclaimer: This note was dictated with voice recognition software. Similar sounding words can inadvertently be transcribed and this note may contain transcription errors which may not have been corrected upon publication of note.**

## 2021-02-28 NOTE — Progress Notes (Signed)
Patient reports skin redness, tenderness, numbness to LT breast and axilla, w/ limited range of motion LT arm, but full range of motion to RT arm. No other symptoms reported at this time.  Meaningful use complete.  BP 132/88   Pulse 80   Temp (!) 97 F (36.1 C)   Resp 20   Wt 187 lb 3.2 oz (84.9 kg)   SpO2 99%   BMI 36.56 kg/m

## 2021-03-01 ENCOUNTER — Other Ambulatory Visit: Payer: Self-pay

## 2021-03-01 ENCOUNTER — Inpatient Hospital Stay: Payer: Medicaid Other

## 2021-03-01 ENCOUNTER — Inpatient Hospital Stay (HOSPITAL_BASED_OUTPATIENT_CLINIC_OR_DEPARTMENT_OTHER): Payer: Medicaid Other | Admitting: Adult Health

## 2021-03-01 VITALS — BP 157/80 | HR 86 | Temp 97.5°F | Resp 18 | Ht 60.0 in | Wt 185.7 lb

## 2021-03-01 DIAGNOSIS — Z170 Estrogen receptor positive status [ER+]: Secondary | ICD-10-CM

## 2021-03-01 DIAGNOSIS — C50412 Malignant neoplasm of upper-outer quadrant of left female breast: Secondary | ICD-10-CM

## 2021-03-01 LAB — COMPREHENSIVE METABOLIC PANEL
ALT: 92 U/L — ABNORMAL HIGH (ref 0–44)
AST: 46 U/L — ABNORMAL HIGH (ref 15–41)
Albumin: 4.3 g/dL (ref 3.5–5.0)
Alkaline Phosphatase: 56 U/L (ref 38–126)
Anion gap: 11 (ref 5–15)
BUN: 20 mg/dL (ref 6–20)
CO2: 23 mmol/L (ref 22–32)
Calcium: 9.3 mg/dL (ref 8.9–10.3)
Chloride: 107 mmol/L (ref 98–111)
Creatinine, Ser: 0.61 mg/dL (ref 0.44–1.00)
GFR, Estimated: >60 mL/min (ref >60)
Glucose, Bld: 161 mg/dL — ABNORMAL HIGH (ref 70–99)
Potassium: 3.7 mmol/L (ref 3.5–5.1)
Sodium: 141 mmol/L (ref 135–145)
Total Bilirubin: 0.4 mg/dL (ref 0.3–1.2)
Total Protein: 7.6 g/dL (ref 6.5–8.1)

## 2021-03-01 LAB — CBC WITH DIFFERENTIAL/PLATELET
Abs Immature Granulocytes: 0 10*3/uL (ref 0.00–0.07)
Basophils Absolute: 0 10*3/uL (ref 0.0–0.1)
Basophils Relative: 0 %
Eosinophils Absolute: 0.2 10*3/uL (ref 0.0–0.5)
Eosinophils Relative: 3 %
HCT: 41.7 % (ref 36.0–46.0)
Hemoglobin: 14.4 g/dL (ref 12.0–15.0)
Immature Granulocytes: 0 %
Lymphocytes Relative: 31 %
Lymphs Abs: 1.9 10*3/uL (ref 0.7–4.0)
MCH: 27.9 pg (ref 26.0–34.0)
MCHC: 34.5 g/dL (ref 30.0–36.0)
MCV: 80.7 fL (ref 80.0–100.0)
Monocytes Absolute: 0.3 10*3/uL (ref 0.1–1.0)
Monocytes Relative: 5 %
Neutro Abs: 3.8 10*3/uL (ref 1.7–7.7)
Neutrophils Relative %: 61 %
Platelets: 333 10*3/uL (ref 150–400)
RBC: 5.17 MIL/uL — ABNORMAL HIGH (ref 3.87–5.11)
RDW: 12.3 % (ref 11.5–15.5)
WBC: 6.2 10*3/uL (ref 4.0–10.5)
nRBC: 0 % (ref 0.0–0.2)

## 2021-03-01 MED ORDER — GOSERELIN ACETATE 3.6 MG ~~LOC~~ IMPL: 3.6000 mg | DRUG_IMPLANT | Freq: Once | SUBCUTANEOUS | Status: DC

## 2021-03-01 NOTE — Progress Notes (Signed)
[facility] Follow up:    Gabriela Orleans, MD [place] [id number]   DIAGNOSIS: Cancer Staging Ductal carcinoma in situ (DCIS) of right breast Staging form: Breast, AJCC 8th Edition - Clinical: Stage 0 (cTis (DCIS), cN0, cM0) - Signed by Chauncey Cruel, MD on 10/04/2020  Malignant neoplasm of upper-outer quadrant of left breast in female, estrogen receptor positive (Gabriela Sullivan) Staging form: Breast, AJCC 8th Edition - Clinical stage from 10/04/2020: Stage IIA (cT3, cN1(f), cM0, G2, ER+, PR+, HER2-) - Signed by Chauncey Cruel, MD on 10/04/2020 Stage prefix: Initial diagnosis Method of lymph node assessment: Core biopsy Histologic grading system: 3 grade system   SUMMARY OF ONCOLOGIC HISTORY: Oncology History  Ductal carcinoma in situ (DCIS) of right breast  09/29/2020 Initial Diagnosis   Ductal carcinoma in situ (DCIS) of right breast   10/04/2020 Cancer Staging   Staging form: Breast, AJCC 8th Edition - Clinical: Stage 0 (cTis (DCIS), cN0, cM0) - Signed by Chauncey Cruel, MD on 10/04/2020   11/25/2020 Genetic Testing   Negative genetic testing:  No pathogenic variants detected on the [employer] CancerNext-Expanded + RNAinsight panel. The report date is 11/25/2020.   The CancerNext-Expanded + RNAinsight gene panel offered by [employer] and includes sequencing and rearrangement analysis for the following 77 genes: AIP, ALK, APC, ATM, AXIN2, BAP1, BARD1, BLM, BMPR1A, BRCA1, BRCA2, BRIP1, CDC73, CDH1, CDK4, CDKN1B, CDKN2A, CHEK2, CTNNA1, DICER1, FANCC, FH, FLCN, GALNT12, KIF1B, LZTR1, MAX, MEN1, MET, MLH1, MSH2, MSH3, MSH6, MUTYH, NBN, NF1, NF2, NTHL1, PALB2, PHOX2B, PMS2, POT1, PRKAR1A, PTCH1, PTEN, RAD51C, RAD51D, RB1, RECQL, RET, SDHA, SDHAF2, SDHB, SDHC, SDHD, SMAD4, SMARCA4, SMARCB1, SMARCE1, STK11, SUFU, TMEM127, TP53, TSC1, TSC2, VHL and XRCC2 (sequencing and deletion/duplication); EGFR, EGLN1, HOXB13, KIT, MITF, PDGFRA, POLD1 and POLE  (sequencing only); EPCAM and GREM1 (deletion/duplication only). RNA data is routinely analyzed for use in variant interpretation for all genes.   Malignant neoplasm of upper-outer quadrant of left breast in female, estrogen receptor positive (Gabriela Sullivan)  09/29/2020 Initial Diagnosis   Malignant neoplasm of upper-outer quadrant of left breast in female, estrogen receptor positive (Gabriela Sullivan)   10/04/2020 Cancer Staging   Staging form: Breast, AJCC 8th Edition - Clinical stage from 10/04/2020: Stage IIA (cT3, cN1(f), cM0, G2, ER+, PR+, HER2-) - Signed by Chauncey Cruel, MD on 10/04/2020 Stage prefix: Initial diagnosis Method of lymph node assessment: Core biopsy Histologic grading system: 3 grade system   10/25/2020 - 11/10/2020 Chemotherapy          11/25/2020 Genetic Testing   Negative genetic testing:  No pathogenic variants detected on the [employer] CancerNext-Expanded + RNAinsight panel. The report date is 11/25/2020.   The CancerNext-Expanded + RNAinsight gene panel offered by [employer] and includes sequencing and rearrangement analysis for the following 77 genes: AIP, ALK, APC, ATM, AXIN2, BAP1, BARD1, BLM, BMPR1A, BRCA1, BRCA2, BRIP1, CDC73, CDH1, CDK4, CDKN1B, CDKN2A, CHEK2, CTNNA1, DICER1, FANCC, FH, FLCN, GALNT12, KIF1B, LZTR1, MAX, MEN1, MET, MLH1, MSH2, MSH3, MSH6, MUTYH, NBN, NF1, NF2, NTHL1, PALB2, PHOX2B, PMS2, POT1, PRKAR1A, PTCH1, PTEN, RAD51C, RAD51D, RB1, RECQL, RET, SDHA, SDHAF2, SDHB, SDHC, SDHD, SMAD4, SMARCA4, SMARCB1, SMARCE1, STK11, SUFU, TMEM127, TP53, TSC1, TSC2, VHL and XRCC2 (sequencing and deletion/duplication); EGFR, EGLN1, HOXB13, KIT, MITF, PDGFRA, POLD1 and POLE (sequencing only); EPCAM and GREM1 (deletion/duplication only). RNA data is routinely analyzed for use in variant interpretation for all genes.     CURRENT THERAPY: Zoladex, anastrozole, zometa, adjuvant radiation  INTERVAL HISTORY: Gabriela Sullivan  30 y.o. female returns for f/u of her h/o breast cancer.  She is  receiving zoladex every 4 weeks and is taking anastrozole daily without issues.  She gets Zometa every 12 weeks and tolerates this well.  She says her incisions are healing moderately well, she has one open area she isn't sure what to do about.  She starts PT next week.  Gabriela Sullivan also tells me that she met with Dr. Lisbeth Renshaw yesterday and they plan to proceed with post mastectomy radiation in the next few days.  She denies any other new issues today.     Patient Active Problem List   Diagnosis Date Noted   Left breast cancer with T3 tumor, >5 cm in greatest dimension ([place]) 01/31/2021   Bone metastases ([place]) 12/28/2020   Port-A-Cath in place 12/06/2020   Genetic testing 11/25/2020   Goals of care, counseling/discussion 10/25/2020   Lytic bone lesions on xray 10/16/2020   Family history of ovarian cancer    Family history of prostate cancer    Family history of breast cancer    Family history of stomach cancer    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   Malignant neoplasm of upper-outer quadrant of left breast in female, estrogen receptor positive (Gabriela Sullivan) 09/29/2020   Elevated LFTs     has No Known Allergies.  MEDICAL HISTORY: Past Medical History:  Diagnosis Date   Asthma    remote   Breast cancer ([place])    Elevated liver enzymes    Family history of breast cancer    Family history of ovarian cancer    Family history of prostate cancer    Family history of stomach cancer    Hepatic steatosis    Hyperglycemia    A1c 7.6% 12/19/20   Migraines    PCOS (polycystic ovarian syndrome)    Pre-diabetes     SURGICAL HISTORY: Past Surgical History:  Procedure Laterality Date   MASTECTOMY W/ SENTINEL NODE BIOPSY Right 01/31/2021   Procedure: RIGHT SIMPLE MASTECTOMY;  Surgeon: Erroll Luna, MD;  Location: [facility];  Service: General;  Laterality: Right;   MODIFIED MASTECTOMY Left 01/31/2021   Procedure: LEFT MODIFIED RADICAL MASTECTOMY;  Surgeon: Erroll Luna, MD;   Location: [facility];  Service: General;  Laterality: Left;   NO PAST SURGERIES     PORTACATH PLACEMENT Right 10/18/2020   Procedure: INSERTION PORT-A-CATH;  Surgeon: Erroll Luna, MD;  Location: [facility];  Service: General;  Laterality: Right;    SOCIAL HISTORY: Social History   Socioeconomic History   Marital status: Single    Spouse name: Not on file   Number of children: Not on file   Years of education: Not on file   Highest education level: Not on file  Occupational History   Not on file  Tobacco Use   Smoking status: Never   Smokeless tobacco: Never  Vaping Use   Vaping Use: Never used  Substance and Sexual Activity   Alcohol use: Never   Drug use: Never   Sexual activity: Not on file  Other Topics Concern   Not on file  Social History Narrative   Not on file   Social Determinants of Health   Financial Resource Strain: Not on file  Food Insecurity: Not on file  Transportation Needs: No Transportation Needs   Lack of Transportation (Medical): No   Lack of Transportation (Non-Medical): No  Physical Activity: Not on file  Stress: Not on file  Social Connections: Not on  file  Intimate Partner Violence: Not on file    FAMILY HISTORY: Family History  Problem Relation Age of Onset   Ovarian cancer Maternal Grandmother        unknown age of diagnosis   Prostate cancer Maternal Grandfather        metastatic   Breast cancer Cousin        unknown age of diagnosis, maternal first cousin   Stomach cancer Cousin        dx 20s/30s, maternal first cousin    Review of Systems  Constitutional:  Negative for appetite change, chills, fatigue, fever and unexpected weight change.  HENT:   Negative for hearing loss, lump/mass and trouble swallowing.   Eyes:  Negative for eye problems and icterus.  Respiratory:  Negative for chest tightness, cough and shortness of breath.   Cardiovascular:  Negative for chest pain, leg swelling and palpitations.   Gastrointestinal:  Negative for abdominal distention, abdominal pain, constipation, diarrhea, nausea and vomiting.  Endocrine: Negative for hot flashes.  Genitourinary:  Negative for difficulty urinating.   Musculoskeletal:  Negative for arthralgias.  Skin:  Negative for itching and rash.  Neurological:  Negative for dizziness, extremity weakness, headaches and numbness.  Hematological:  Negative for adenopathy. Does not bruise/bleed easily.  Psychiatric/Behavioral:  Negative for depression. The patient is not nervous/anxious.      PHYSICAL EXAMINATION  ECOG PERFORMANCE STATUS: 1 - Symptomatic but completely ambulatory  Vitals:   03/01/21 1054  BP: (!) 157/80  Pulse: 86  Resp: 18  Temp: (!) 97.5 F (36.4 C)  SpO2: 98%    Physical Exam Constitutional:      General: She is not in acute distress.    Appearance: Normal appearance. She is not toxic-appearing.  HENT:     Head: Normocephalic and atraumatic.  Eyes:     General: No scleral icterus. Cardiovascular:     Rate and Rhythm: Normal rate and regular rhythm.     Pulses: Normal pulses.     Heart sounds: Normal heart sounds.  Pulmonary:     Effort: Pulmonary effort is normal.     Breath sounds: Normal breath sounds.     Comments: See picture in media tab  Abdominal:     General: Abdomen is flat. Bowel sounds are normal. There is no distension.     Palpations: Abdomen is soft.     Tenderness: There is no abdominal tenderness.  Musculoskeletal:        General: No swelling.     Cervical back: Neck supple.  Lymphadenopathy:     Cervical: No cervical adenopathy.  Skin:    General: Skin is warm and dry.     Findings: No rash.  Neurological:     General: No focal deficit present.     Mental Status: She is alert.  Psychiatric:        Mood and Affect: Mood normal.        Behavior: Behavior normal.    LABORATORY DATA:  CBC    Component Value Date/Time   WBC 6.2 03/01/2021 0907   RBC 5.17 (H) 03/01/2021 0907    HGB 14.4 03/01/2021 0907   HGB 13.9 10/04/2020 0755   HCT 41.7 03/01/2021 0907   PLT 333 03/01/2021 0907   PLT 302 10/04/2020 0755   MCV 80.7 03/01/2021 0907   MCH 27.9 03/01/2021 0907   MCHC 34.5 03/01/2021 0907   RDW 12.3 03/01/2021 0907   LYMPHSABS 1.9 03/01/2021 0907   MONOABS 0.3 03/01/2021 0932  EOSABS 0.2 03/01/2021 0907   BASOSABS 0.0 03/01/2021 0907    CMP     Component Value Date/Time   NA 141 03/01/2021 0907   K 3.7 03/01/2021 0907   CL 107 03/01/2021 0907   CO2 23 03/01/2021 0907   GLUCOSE 161 (H) 03/01/2021 0907   BUN 20 03/01/2021 0907   CREATININE 0.61 03/01/2021 0907   CREATININE 0.64 10/04/2020 0755   CALCIUM 9.3 03/01/2021 0907   PROT 7.6 03/01/2021 0907   ALBUMIN 4.3 03/01/2021 0907   AST 46 (H) 03/01/2021 0907   AST 47 (H) 10/04/2020 0755   ALT 92 (H) 03/01/2021 0907   ALT 108 (H) 10/04/2020 0755   ALKPHOS 56 03/01/2021 0907   BILITOT 0.4 03/01/2021 0907   BILITOT 0.7 10/04/2020 0755   GFRNONAA >60 03/01/2021 0907   GFRNONAA >60 10/04/2020 0755     ASSESSMENT and PLAN:   Malignant neoplasm of upper-outer quadrant of left breast in female, estrogen receptor positive ([place]) Gabriela Sullivan is here today for f/u of her breast cancer.  She continues on treatment with good tolerance receiving Goserelin and Anastrozole.  She will cotninue this.  She is not due for Zometa today, but will receive it in November at her f/u appointment with Dr. Lindi Adie.    Shadiamond notes that she is concerned about her left axillary open surgical wound.  I took a picture and sent a message to Dr. Brantley Stage to get her scheduled for further evaluation.  She knows to call for any questions that may arise between now and her next appointment.  We are happy to see her sooner if needed.  Total encounter time: 20 minutes in face to face time, chart review, lab review, order entry, care coordination, and documentation of the encounter.  Gabriela Bihari, NP 03/02/21 8:16 PM Medical Oncology and  Hematology [facility] [place], [place] [address] Tel. [phone]    Fax. [phone]  *Total Encounter Time as defined by the Centers for Medicare and Medicaid Services includes, in addition to the face-to-face time of a patient visit (documented in the note above) non-face-to-face time: obtaining and reviewing outside history, ordering and reviewing medications, tests or procedures, care coordination (communications with other health care professionals or caregivers) and documentation in the medical record.

## 2021-03-01 NOTE — Progress Notes (Signed)
Led axillary wound

## 2021-03-02 ENCOUNTER — Encounter: Payer: Self-pay | Admitting: Adult Health

## 2021-03-02 ENCOUNTER — Encounter: Payer: Self-pay | Admitting: Oncology

## 2021-03-02 NOTE — Assessment & Plan Note (Signed)
Gabriela Sullivan is here today for f/u of her breast cancer.  She continues on treatment with good tolerance receiving Goserelin and Anastrozole.  She will cotninue this.  She is not due for Zometa today, but will receive it in November at her f/u appointment with Dr. Lindi Adie.    Gabriela Sullivan notes that she is concerned about her left axillary open surgical wound.  I took a picture and sent a message to Dr. Brantley Stage to get her scheduled for further evaluation.  She knows to call for any questions that may arise between now and her next appointment.  We are happy to see her sooner if needed.

## 2021-03-05 ENCOUNTER — Encounter: Payer: Self-pay | Admitting: *Deleted

## 2021-03-06 ENCOUNTER — Ambulatory Visit
Admission: RE | Admit: 2021-03-06 | Discharge: 2021-03-06 | Disposition: A | Discharge status: Home / Self Care | Payer: Medicaid Other | Source: Ambulatory Visit | Attending: Radiation Oncology | Admitting: Radiation Oncology

## 2021-03-06 ENCOUNTER — Other Ambulatory Visit: Payer: Self-pay

## 2021-03-06 DIAGNOSIS — D0511 Intraductal carcinoma in situ of right breast: Secondary | ICD-10-CM | POA: Diagnosis not present

## 2021-03-06 DIAGNOSIS — Z510 Encounter for antineoplastic radiation therapy: Secondary | ICD-10-CM | POA: Insufficient documentation

## 2021-03-06 DIAGNOSIS — C50412 Malignant neoplasm of upper-outer quadrant of left female breast: Secondary | ICD-10-CM | POA: Diagnosis present

## 2021-03-06 DIAGNOSIS — R293 Abnormal posture: Secondary | ICD-10-CM | POA: Diagnosis present

## 2021-03-06 DIAGNOSIS — M25611 Stiffness of right shoulder, not elsewhere classified: Secondary | ICD-10-CM | POA: Diagnosis present

## 2021-03-06 DIAGNOSIS — M25612 Stiffness of left shoulder, not elsewhere classified: Secondary | ICD-10-CM | POA: Diagnosis present

## 2021-03-06 DIAGNOSIS — Z170 Estrogen receptor positive status [ER+]: Secondary | ICD-10-CM | POA: Diagnosis present

## 2021-03-07 ENCOUNTER — Encounter: Payer: Self-pay | Admitting: Nurse Practitioner

## 2021-03-07 ENCOUNTER — Ambulatory Visit (INDEPENDENT_AMBULATORY_CARE_PROVIDER_SITE_OTHER): Payer: Medicaid Other | Admitting: Nurse Practitioner

## 2021-03-07 VITALS — BP 122/90 | HR 88 | Ht 60.0 in | Wt 185.0 lb

## 2021-03-07 DIAGNOSIS — C50912 Malignant neoplasm of unspecified site of left female breast: Secondary | ICD-10-CM

## 2021-03-07 DIAGNOSIS — K7581 Nonalcoholic steatohepatitis (NASH): Secondary | ICD-10-CM

## 2021-03-07 DIAGNOSIS — R7989 Other specified abnormal findings of blood chemistry: Secondary | ICD-10-CM

## 2021-03-07 NOTE — Progress Notes (Addendum)
03/07/2021 Gabriela Sullivan [id number] Jul 13, 1990   CHIEF COMPLAINT: Abnormal liver tests   HISTORY OF PRESENT ILLNESS: Gabriela Sullivan is a 30 year old female with a past medical history of obesity, PCOS, DM II diagnosed one month, breast cancer stage IV initially diagnosed 09/2020 s/p bilateral mastectomy and biopsy proven NASH. She presents to our office today as referred by Dr. Jana Hakim for further evaluation regarding elevated LFTs. She was diagnosed with breast cancer. She underwent bilateral mastectomy surgery and received chemo (Cyclophosphamide and Doxorubicin) infusions 5/25 and 11/10/2020 then discontinued due to rising LFTs. Started on Zoladex 10/11/2020. Started on Zolendronate 11/01/2020 and Anastrozole 12/06/2020. Radiation therapy to initiated next week. She was started on Venlafaxine XR 2 to 3 months ago. Started on Metformin 12/29/2020. No alcohol or drug use. Her baseline AST/ALT levels were 47/100 May 2022 then AST/ALT levels spiked to 251/515 on 12/28/2020. Her LFTs since then went back to her baseline levels with intermittent fluctuations. See lab summary below.   Addendum: Labs 07/20/2014 from [facility]: Hepatitis C RNA undetectable. Iron within normal limits. AMA positive 1.34. ASMA negative. Hep B surface antigen negative. Hep B core total antibody nonreactive, hepatitis B surface antibody positive. Hepatitis A total antibody reactive.  HIV nonreactive. Quantitative immunoglobulins within normal limits. A1AT within normal limits. Ceruloplasmin within normal limits. ANA negative. LKM negative. Rheumatoid factor negative.   10/04/2020: Alk phos 59. AST 47. ALT 100. T. Bili 0.7.  11/01/2020: Alk phos 96. AST 17. ALT 98. T. Bili 0.9.   11/29/2020: Alk phos 78. AST 97. ALT 220. T. Bili 0.5  12/06/2020: Alk phos 63. AST 186. ALT 339. T. Bili 0.5.  Hep A IgM nonreactive. Hep B surface antigen negative. Hep B core IgM nonreactive. Hep C antibody nonreactive.   12/19/2020: Alk phos 53. AST 140.  ALT 290. T. Bili 0.7.   12/28/2020: Alk phos 58. AST 251. ALT 515. T. Bili 0.7.   01/25/2021:  Alk phos 41. AST 60. Alt 120. T. Bili 1.0.   02/09/2021:  Alk phos 48. AST 25.  ALT 49. T. Bili 0.4.   03/01/2021:  Alk phos 58. AST 46. ALT 92. T. Bili 0.4. WBC 6.2. Hg 14.4. PLT 333.  She underwent a liver MRI on 11/30/2020 which did not show any evidence of metastatic disease to the liver or intra/extrahepatic ductal dilatation but showed evidence of metastatic disease to the spine.   She has lost 15lbs within the past 6 months. Previously weighed 200lbs. Today's weight 185 lbs. She denies having any dysphagia, heartburn or upper abdominal pain. She is passing a normal formed bowel movement daily. No rectal bleeding or black stools. Overall, she reports reports feeling well.    Liver MRI w/wo contrast to screen for liver mets 11/30/2020: Lower chest: Unremarkable.   Hepatobiliary: Diffuse loss of signal intensity in the liver parenchyma on out of phase T1 imaging is compatible with fatty deposition. No suspicious enhancing liver lesion to suggest metastatic disease. No intrahepatic or extrahepatic biliary dilation. Gallbladder is nondistended.   Pancreas: No focal mass lesion. No dilatation of the main duct. No intraparenchymal cyst. No peripancreatic edema.   Spleen:  No splenomegaly. No focal mass lesion.   Adrenals/Urinary Tract: No adrenal nodule or mass. Tiny T2 hyperintensity in the upper pole right kidney is too small to characterize but likely a cyst. Left kidney unremarkable.   Stomach/Bowel: Stomach is unremarkable. No gastric wall thickening. No evidence of outlet obstruction. Duodenum is normally positioned as is  the ligament of Treitz. No small bowel wall thickening. No small bowel dilatation.   Vascular/Lymphatic: No abdominal aortic aneurysm. There is no gastrohepatic or hepatoduodenal ligament lymphadenopathy. No retroperitoneal or mesenteric lymphadenopathy.   Other:   No intraperitoneal free fluid.   Musculoskeletal: Numerous tiny enhancing lesions in the spine are compatible with metastatic disease, better characterized on recent spine MRI of 10/21/2020.   IMPRESSION: 1. No evidence for metastatic disease to the liver. No intra or extrahepatic biliary duct dilatation. 2. Hepatic steatosis. 3. Numerous tiny enhancing lesions in the spine are compatible with metastatic disease, better characterized on recent spine MRI of 10/21/2020.  Liver, needle/core biopsy 08/03/2014: - BENIGN LIVER WITH FEATURES OF STEATOHEPATITIS, SEE COMMENT. - TRICHROME STAIN DEMONSTRATES PORTAL AND SINUSOIDAL FIBROSIS. - IRON STAIN NEGATIVE FOR HEMOSIDERIN DEPOSITION.   Past Medical History:  Diagnosis Date   Asthma    remote   Breast cancer ([place])    Elevated liver enzymes    Family history of breast cancer    Family history of ovarian cancer    Family history of prostate cancer    Family history of stomach cancer    Hepatic steatosis    Hyperglycemia    A1c 7.6% 12/19/20   Migraines    PCOS (polycystic ovarian syndrome)    Pre-diabetes    Past Surgical History:  Procedure Laterality Date   MASTECTOMY W/ SENTINEL NODE BIOPSY Right 01/31/2021   Procedure: RIGHT SIMPLE MASTECTOMY;  Surgeon: Erroll Luna, MD;  Location: [facility];  Service: General;  Laterality: Right;   MODIFIED MASTECTOMY Left 01/31/2021   Procedure: LEFT MODIFIED RADICAL MASTECTOMY;  Surgeon: Erroll Luna, MD;  Location: [place];  Service: General;  Laterality: Left;   NO PAST SURGERIES     PORTACATH PLACEMENT Right 10/18/2020   Procedure: INSERTION PORT-A-CATH;  Surgeon: Erroll Luna, MD;  Location: [facility];  Service: General;  Laterality: Right;   Social History: She is single. 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 Nonsmoker. No alcohol use. No drug use.   Family History: family history includes Breast cancer in her cousin; Ovarian cancer in her maternal grandmother;  Prostate cancer in her maternal grandfather; Stomach cancer in her cousin. No family history of colon, pancreatic or liver cancer.   No Known Allergies   Outpatient Encounter Medications as of 03/07/2021  Medication Sig   anastrozole (ARIMIDEX) 1 MG tablet Take 1 tablet (1 mg total) by mouth daily.   calcium carbonate (TUMS - DOSED IN MG ELEMENTAL CALCIUM) 500 MG chewable tablet Chew 2 tablets by mouth every other day.   cholecalciferol (VITAMIN D3) 25 MCG (1000 UNIT) tablet Take 1,000 Units by mouth daily.   Goserelin Acetate (ZOLADEX [place]) Inject 1 Dose into the skin every 30 (thirty) days.   ibuprofen (ADVIL) 800 MG tablet Take 1 tablet (800 mg total) by mouth every 8 (eight) hours as needed.   metFORMIN (GLUCOPHAGE-XR) 500 MG 24 hr tablet Take 1,000 mg by mouth every evening.   oxyCODONE (OXY IR/ROXICODONE) 5 MG immediate release tablet Take 1 tablet (5 mg total) by mouth every 6 (six) hours as needed for severe pain.   venlafaxine XR (EFFEXOR-XR) 37.5 MG 24 hr capsule Take 1 capsule (37.5 mg total) by mouth daily with breakfast.   Vitamin D, Ergocalciferol, (DRISDOL) 50000 UNITS CAPS capsule Take 50,000 Units by mouth every Monday.   [DISCONTINUED] prochlorperazine (COMPAZINE) 10 MG tablet Take 1 tablet (10 mg total) by mouth every 6 (six) hours as needed (Nausea  or vomiting).   No facility-administered encounter medications on file as of 03/07/2021.    Gen: Denies fever, sweats or chills. No weight loss.  CV: Denies chest pain, palpitations or edema. Resp: Denies cough, shortness of breath of hemoptysis.  GI:See HPI.  GU : Denies urinary burning, blood in urine, increased urinary frequency or incontinence. MS: Denies joint pain, muscles aches or weakness. Derm: Denies rash, itchiness, skin lesions or unhealing ulcers. Psych: Denies depression, anxiety or memory loss.  Heme: Denies bruising, bleeding. Neuro:  Denies headaches, dizziness or paresthesias. Endo:  Recently diagnosed with  diabetes.   PHYSICAL EXAM: BP 122/90   Pulse 88   Ht 5' (1.524 m)   Wt 185 lb (83.9 kg)   BMI 36.13 kg/m  General: 30 year old female in NAD.  Head: Normocephalic and atraumatic. Eyes:  Sclerae non-icteric, conjunctive pink. Ears: Normal auditory acuity. Mouth: Dentition intact. No ulcers or lesions.  Neck: Supple, no lymphadenopathy or thyromegaly.  Lungs: Clear bilaterally to auscultation without wheezes, crackles or rhonchi. Chest: Right subclavian portacath intact.  Heart: Regular rate and rhythm. No murmur, rub or gallop appreciated.  Abdomen: Soft, nontender, non distended. No masses. No hepatosplenomegaly. Normoactive bowel sounds x 4 quadrants.  Rectal: Deferred.  Musculoskeletal: Symmetrical with no gross deformities. Skin: Warm and dry. No rash or lesions on visible extremities. Extremities: No edema. Neurological: Alert oriented x 4, no focal deficits.  Psychological:  Alert and cooperative. Normal mood and affect.  ASSESSMENT AND PLAN:  39) 30 year old female with a history of NASH with elevated LFTs. AST and ALT levels increased from baseline levels 11/2020 and spiked 12/28/2020 with AST 251 and ALT 515 possibly due to chemo and hormone associated therapy. AST/ALT levels nearly back to baseline. Liver MRI without evidence of cirrhosis, hepatoma/liver mets. Normal gallbladder. No intra/extrahepatic bilary dilatation.  -Repeat hepatic panel  -Avoid weight gain -Further recommendations to be determined after lab results received  -Follow up in office with Dr. Havery Moros in 6 to 8 weeks   2) Stage IV breast cancer s/p bilateral mastectomy treated with chemo with plans to initiate radiation next week  3) DM II on Metformin     CC:  [name], MD

## 2021-03-07 NOTE — Patient Instructions (Addendum)
If you are age 31 or younger, your body mass index should be between 19-25. Your Body mass index is 36.13 kg/m. If this is out of the aformentioned range listed, please consider follow up with your Primary Care Provider.   The  GI providers would like to encourage you to use [facility] to communicate with providers for non-urgent requests or questions.  Due to long hold times on the telephone, sending your provider a message by [facility] may be faster and more efficient way to get a response. Please allow 48 business hours for a response.  Please remember that this is for non-urgent requests/questions.  LABS:  Lab work has been ordered for you to do in 2 weeks.  Our lab is located in the basement. Press "B" on the elevator. The lab is located at the first door on the left as you exit the elevator.  HEALTHCARE LAWS AND MY CHART RESULTS: Due to recent changes in healthcare laws, you may see the results of your imaging and laboratory studies on MyChart before your provider has had a chance to review them.   We understand that in some cases there may be results that are confusing or concerning to you. Not all laboratory results come back in the same time frame and the provider may be waiting for multiple results in order to interpret others.  Please give [place] 48 hours in order for your provider to thoroughly review all the results before contacting the office for clarification of your results.   We have scheduled you a follow up with Dr. Havery Moros on 05/07/21 at 1:40pm.  Avoid weight gain. Eventual nutritional management consult will be needed.  It was great seeing you today! Thank you for entrusting me with your care and choosing [facility].  Noralyn Pick, CRNP

## 2021-03-08 ENCOUNTER — Inpatient Hospital Stay: Payer: Medicaid Other | Attending: Oncology

## 2021-03-08 ENCOUNTER — Other Ambulatory Visit: Payer: Self-pay

## 2021-03-08 VITALS — BP 139/90 | HR 73 | Temp 98.2°F | Resp 18

## 2021-03-08 DIAGNOSIS — Z95828 Presence of other vascular implants and grafts: Secondary | ICD-10-CM

## 2021-03-08 DIAGNOSIS — Z79811 Long term (current) use of aromatase inhibitors: Secondary | ICD-10-CM | POA: Insufficient documentation

## 2021-03-08 DIAGNOSIS — C50412 Malignant neoplasm of upper-outer quadrant of left female breast: Secondary | ICD-10-CM | POA: Diagnosis not present

## 2021-03-08 DIAGNOSIS — Z170 Estrogen receptor positive status [ER+]: Secondary | ICD-10-CM | POA: Diagnosis not present

## 2021-03-08 DIAGNOSIS — C7951 Secondary malignant neoplasm of bone: Secondary | ICD-10-CM | POA: Insufficient documentation

## 2021-03-08 MED ORDER — GOSERELIN ACETATE 3.6 MG ~~LOC~~ IMPL
3.6000 mg | DRUG_IMPLANT | Freq: Once | SUBCUTANEOUS | Status: AC
  Administered 2021-03-08: 3.6 mg via SUBCUTANEOUS
  Filled 2021-03-08: qty 3.6

## 2021-03-08 NOTE — Patient Instructions (Signed)
Goserelin injection What is this medication? GOSERELIN (GOE se rel in) is similar to a hormone found in the body. It lowers the amount of sex hormones that the body makes. Men will have lower testosterone levels and women will have lower estrogen levels while taking this medicine. In men, this medicine is used to treat prostate cancer; the injection is either given once per month or once every 12 weeks. A once per month injection (only) is used to treat women with endometriosis, dysfunctional uterine bleeding, or advanced breast cancer. This medicine may be used for other purposes; ask your health care provider or pharmacist if you have questions. COMMON BRAND NAME(S): Zoladex What should I tell my care team before I take this medication? They need to know if you have any of these conditions: bone problems diabetes heart disease history of irregular heartbeat an unusual or allergic reaction to goserelin, other medicines, foods, dyes, or preservatives pregnant or trying to get pregnant breast-feeding How should I use this medication? This medicine is for injection under the skin. It is given by a health care professional in a hospital or clinic setting. Talk to your pediatrician regarding the use of this medicine in children. Special care may be needed. Overdosage: If you think you have taken too much of this medicine contact a poison control center or emergency room at once. NOTE: This medicine is only for you. Do not share this medicine with others. What if I miss a dose? It is important not to miss your dose. Call your doctor or health care professional if you are unable to keep an appointment. What may interact with this medication? Do not take this medicine with any of the following medications: cisapride dronedarone pimozide thioridazine This medicine may also interact with the following medications: other medicines that prolong the QT interval (an abnormal heart rhythm) This list  may not describe all possible interactions. Give your health care provider a list of all the medicines, herbs, non-prescription drugs, or dietary supplements you use. Also tell them if you smoke, drink alcohol, or use illegal drugs. Some items may interact with your medicine. What should I watch for while using this medication? Visit your doctor or health care provider for regular checks on your progress. Your symptoms may appear to get worse during the first weeks of this therapy. Tell your doctor or healthcare provider if your symptoms do not start to get better or if they get worse after this time. Your bones may get weaker if you take this medicine for a long time. If you smoke or frequently drink alcohol you may increase your risk of bone loss. A family history of osteoporosis, chronic use of drugs for seizures (convulsions), or corticosteroids can also increase your risk of bone loss. Talk to your doctor about how to keep your bones strong. This medicine should stop regular monthly menstruation in women. Tell your doctor if you continue to menstruate. Women should not become pregnant while taking this medicine or for 12 weeks after stopping this medicine. Women should inform their doctor if they wish to become pregnant or think they might be pregnant. There is a potential for serious side effects to an unborn child. Talk to your health care professional or pharmacist for more information. Do not breast-feed an infant while taking this medicine. Men should inform their doctors if they wish to father a child. This medicine may lower sperm counts. Talk to your health care professional or pharmacist for more information. This medicine may   increase blood sugar. Ask your healthcare provider if changes in diet or medicines are needed if you have diabetes. What side effects may I notice from receiving this medication? Side effects that you should report to your doctor or health care professional as soon as  possible: allergic reactions like skin rash, itching or hives, swelling of the face, lips, or tongue bone pain breathing problems changes in vision chest pain feeling faint or lightheaded, falls fever, chills pain, swelling, warmth in the leg pain, tingling, numbness in the hands or feet signs and symptoms of high blood sugar such as being more thirsty or hungry or having to urinate more than normal. You may also feel very tired or have blurry vision signs and symptoms of low blood pressure like dizziness; feeling faint or lightheaded, falls; unusually weak or tired stomach pain swelling of the ankles, feet, hands trouble passing urine or change in the amount of urine unusually high or low blood pressure unusually weak or tired Side effects that usually do not require medical attention (report to your doctor or health care professional if they continue or are bothersome): change in sex drive or performance changes in breast size in both males and females changes in emotions or moods headache hot flashes irritation at site where injected loss of appetite skin problems like acne, dry skin vaginal dryness This list may not describe all possible side effects. Call your doctor for medical advice about side effects. You may report side effects to FDA at 1-800-FDA-1088. Where should I keep my medication? This drug is given in a hospital or clinic and will not be stored at home. NOTE: This sheet is a summary. It may not cover all possible information. If you have questions about this medicine, talk to your doctor, pharmacist, or health care provider.  2022 Elsevier/Gold Standard (2018-09-07 14:05:56)  

## 2021-03-10 NOTE — Progress Notes (Signed)
Agree with assessment with the following thoughts: Patient has baseline NASH with liver biopsy in 2016 showing this with grade 2/4 fibrotic change at that time. Now with rise in LAEs while on regimen for breast cancer, levels have since gone back to prior baseline. MRI shows no metastatic disease to the liver and no overt cirrhosis, which is good news. Agree with repeating LAEs and seeing where this is trending. If rising / fails to improve with the patient's weight loss may need to consider a repeat biopsy.  Jaclyn Shaggy can you clarify what work the patient has had done in the past in regards to screening for other causes? Looks like recent viral hep panel negative. She does warrant hep A total AB and hep B surface antigen to screen for immunity to these and give vaccine if needed.

## 2021-03-13 ENCOUNTER — Ambulatory Visit: Payer: Medicaid Other | Attending: Surgery

## 2021-03-13 ENCOUNTER — Other Ambulatory Visit: Payer: Self-pay

## 2021-03-13 ENCOUNTER — Ambulatory Visit: Admission: RE | Admit: 2021-03-13 | Payer: Medicaid Other | Source: Ambulatory Visit | Admitting: Radiation Oncology

## 2021-03-13 DIAGNOSIS — R293 Abnormal posture: Secondary | ICD-10-CM | POA: Insufficient documentation

## 2021-03-13 DIAGNOSIS — M25611 Stiffness of right shoulder, not elsewhere classified: Secondary | ICD-10-CM | POA: Insufficient documentation

## 2021-03-13 DIAGNOSIS — D0511 Intraductal carcinoma in situ of right breast: Secondary | ICD-10-CM | POA: Insufficient documentation

## 2021-03-13 DIAGNOSIS — C50412 Malignant neoplasm of upper-outer quadrant of left female breast: Secondary | ICD-10-CM | POA: Insufficient documentation

## 2021-03-13 DIAGNOSIS — M25612 Stiffness of left shoulder, not elsewhere classified: Secondary | ICD-10-CM | POA: Insufficient documentation

## 2021-03-13 DIAGNOSIS — Z170 Estrogen receptor positive status [ER+]: Secondary | ICD-10-CM | POA: Insufficient documentation

## 2021-03-13 NOTE — Therapy (Signed)
[facility] @ [place], [place], [address] Phone: [phone]   Fax:  [phone]  Physical Therapy Treatment  Patient Details  Name: Gabriela Sullivan MRN: [id number] Date of Birth: 03-Mar-1991 Referring Provider (PT): Dr. Erroll Luna   Encounter Date: 03/13/2021   PT End of Session - 03/13/21 0953     Visit Number 3    Number of Visits 10    Date for PT Re-Evaluation 03/22/21    PT Start Time 0907    PT Stop Time 1002    PT Time Calculation (min) 55 min    Activity Tolerance Patient tolerated treatment well    Behavior During Therapy [facility] for tasks assessed/performed             Past Medical History:  Diagnosis Date   Asthma    remote   Breast cancer ([place])    Elevated liver enzymes    Family history of breast cancer    Family history of ovarian cancer    Family history of prostate cancer    Family history of stomach cancer    Hepatic steatosis    Hyperglycemia    A1c 7.6% 12/19/20   Migraines    PCOS (polycystic ovarian syndrome)    Pre-diabetes     Past Surgical History:  Procedure Laterality Date   MASTECTOMY W/ SENTINEL NODE BIOPSY Right 01/31/2021   Procedure: RIGHT SIMPLE MASTECTOMY;  Surgeon: Erroll Luna, MD;  Location: [place];  Service: General;  Laterality: Right;   MODIFIED MASTECTOMY Left 01/31/2021   Procedure: LEFT MODIFIED RADICAL MASTECTOMY;  Surgeon: Erroll Luna, MD;  Location: [facility];  Service: General;  Laterality: Left;   NO PAST SURGERIES     PORTACATH PLACEMENT Right 10/18/2020   Procedure: INSERTION PORT-A-CATH;  Surgeon: Erroll Luna, MD;  Location: [place];  Service: General;  Laterality: Right;    There were no vitals filed for this visit.   Subjective Assessment - 03/13/21 0912     Subjective I've been doing the exercises Inez Catalina gave me last time and they are going well. My Rt shoulder is feeling alot better, the Lt is still feeling most of  the tightness in my axilla. I start radiation today. The simulation went pretty good last week, I didn't have any problem getting my arm in position.    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 It is ER/PR positive and HER2 negative with a Ki67 of 15%. She underwent neoadjuvant chemotherapy beginning 10/24/2020 for 2 rounds which was stopped due to elevated LFTs. She had a bilateral mastectomy with left axillary node dissection on 01/31/2021 with 15/15 nodes positive. She started Anastrozole on 12/06/2020. 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    Patient Stated Goals Get my arms back to normal    Currently in Pain? No/denies                               [facility] Adult PT Treatment/Exercise - 03/13/21 0001       Shoulder Exercises: Supine   Horizontal ABduction Strengthening;Both;10 reps;Theraband    Theraband Level (Shoulder Horizontal ABduction) Level 1 (Yellow)    External Rotation Strengthening;Both;10 reps;Theraband    Theraband Level (Shoulder External Rotation) Level 1 (Yellow)    Flexion Strengthening;Both;10 reps;Theraband   Narrow  and Wide Grip, 10x each   Theraband Level (Shoulder Flexion) Level 1 (Yellow)    Diagonals Strengthening;Right;Left;10 reps;Theraband    Theraband Level (Shoulder Diagonals) Level 1 (Yellow)      Manual Therapy   Manual Therapy Myofascial release;Passive ROM    Myofascial Release In Supine to Lt axilla during P/ROM and UE pulling during bil shoulders P/ROM, was mindful of not creating a pull at Lt lateral incision at open area    Passive ROM In supine to bil shoulders but focused more on Lt into flexion, abduction and D2 to pts tolerance being mindful of not pulling at Lt lateral incision                     PT Education - 03/13/21 0952     Education Details Supine scapular series with yellow  theraband    Person(s) Educated Patient    Methods Explanation;Demonstration;Handout    Comprehension Verbalized understanding;Returned demonstration                 PT Long Term Goals - 02/22/21 1055       PT LONG TERM GOAL #1   Title Patient will be independent in her HEP for improving shoulder ROM and function.    Baseline No knowledge    Time 4    Period Weeks    Status New    Target Date 03/22/21      PT LONG TERM GOAL #2   Title Patient will increase bilateral shoulder flexion AROM to >/= 146 degrees for increased ease reaching.    Baseline Right shoulder 110 degrees post op; 153 pre-op; left shoulder 98 degrees post op; 146 pre-op    Time 4    Period Weeks    Status New    Target Date 03/22/21      PT LONG TERM GOAL #3   Title Patient will increase bilateral shoulder abduction AROM to >/= 150 degrees for increased ease reaching.    Baseline Right shoulder 157 degrees pre-op and 128 post op; left shoulder 152 degrees pre-op and 64 degrees post op    Time 4    Period Weeks    Status New    Target Date 03/22/21      PT LONG TERM GOAL #4   Title Patient will improve her DASH score for increased overall upper extremity function to </= 11.36    Baseline 11.36 pre-op and 22.73 post op    Time 4    Period Weeks    Status New    Target Date 03/22/21      PT LONG TERM GOAL #5   Title Patient will verbalize good understanding of lymphedema risk reduction practices.    Baseline No knowledge    Time 4    Period Weeks    Status New    Target Date 03/22/21                   Plan - 03/13/21 8177     Clinical Impression Statement Pt has made excellent gains since she was here last. She now has full P/ROM in bil shoulders so issued supine scapular series today. She did excellent with this returning good demo and reporting feeling good stretch at end motions, no pain or discomofort. She does have more recent open area at most lateral aspect of her Lt incision  that is in a skin fold that has been mildly draining for about a week. She made radiation team  aware of this last week so they will assess this again before radiation today. Did caution pt throughout session with all activities to not push to feeling a pull at this area and she was able to verbalize good understanding of this.    Stability/Clinical Decision Making Stable/Uncomplicated    PT Frequency 2x / week    PT Duration 4 weeks    PT Treatment/Interventions ADLs/Self Care Home Management;Therapeutic exercise;Manual techniques;Manual lymph drainage;Patient/family education;Scar mobilization;Passive range of motion    PT Next Visit Plan Cont PROM bilateral shoulders, add AAROM exercises with pulleys and ball roll up wall but be mondful of open area at Lt lateral incision; review supine scapular series prn    PT Home Exercise Plan Post op shoulder ROM HEP; supine scapular series with yellow theraband    Consulted and Agree with Plan of Care Patient             Patient will benefit from skilled therapeutic intervention in order to improve the following deficits and impairments:  Postural dysfunction, Decreased range of motion, Decreased knowledge of precautions, Impaired UE functional use, Pain, Increased fascial restricitons, Decreased strength, Increased edema, Decreased scar mobility, Decreased skin integrity  Visit Diagnosis: Ductal carcinoma in situ (DCIS) of right breast  Malignant neoplasm of upper-outer quadrant of left breast in female, estrogen receptor positive (HCC)  Abnormal posture  Stiffness of left shoulder, not elsewhere classified  Stiffness of right shoulder, not elsewhere classified     Problem List Patient Active Problem List   Diagnosis Date Noted   Left breast cancer with T3 tumor, >5 cm in greatest dimension ([place]) 01/31/2021   Bone metastases ([place]) 12/28/2020   Port-A-Cath in place 12/06/2020   Genetic testing 11/25/2020   Goals of care,  counseling/discussion 10/25/2020   Lytic bone lesions on xray 10/16/2020   Family history of ovarian cancer    Family history of prostate cancer    Family history of breast cancer    Family history of stomach cancer    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   Malignant neoplasm of upper-outer quadrant of left breast in female, estrogen receptor positive ([place]) 09/29/2020   Elevated LFTs     Otelia Limes, PTA 03/13/2021, 10:04 AM  [facility] @ [place], [place], [address] Phone: [phone]   Fax:  [phone]  Name: Kyiesha Millward MRN: [id number] Date of Birth: [birth year]

## 2021-03-13 NOTE — Patient Instructions (Signed)

## 2021-03-14 ENCOUNTER — Ambulatory Visit: Payer: Medicaid Other

## 2021-03-14 ENCOUNTER — Ambulatory Visit
Admission: RE | Admit: 2021-03-14 | Discharge: 2021-03-14 | Disposition: A | Discharge status: Home / Self Care | Payer: Medicaid Other | Source: Ambulatory Visit | Attending: Radiation Oncology | Admitting: Radiation Oncology

## 2021-03-14 DIAGNOSIS — M25611 Stiffness of right shoulder, not elsewhere classified: Secondary | ICD-10-CM

## 2021-03-14 DIAGNOSIS — C50412 Malignant neoplasm of upper-outer quadrant of left female breast: Secondary | ICD-10-CM

## 2021-03-14 DIAGNOSIS — R293 Abnormal posture: Secondary | ICD-10-CM

## 2021-03-14 DIAGNOSIS — Z170 Estrogen receptor positive status [ER+]: Secondary | ICD-10-CM

## 2021-03-14 DIAGNOSIS — M25612 Stiffness of left shoulder, not elsewhere classified: Secondary | ICD-10-CM

## 2021-03-14 DIAGNOSIS — D0511 Intraductal carcinoma in situ of right breast: Secondary | ICD-10-CM

## 2021-03-14 NOTE — Therapy (Signed)
[facility] @ [place], [place], [address] Phone: [phone]   Fax:  [phone]  Physical Therapy Treatment  Patient Details  Name: Gabriela Sullivan MRN: [id number] Date of Birth: 1990-09-16 Referring Provider (PT): Dr. Erroll Luna   Encounter Date: 03/14/2021   PT End of Session - 03/14/21 0958     Visit Number 4    Number of Visits 10    Date for PT Re-Evaluation 03/22/21    PT Start Time 0910    PT Stop Time 1003    PT Time Calculation (min) 53 min    Activity Tolerance Patient tolerated treatment well    Behavior During Therapy [facility] for tasks assessed/performed             Past Medical History:  Diagnosis Date   Asthma    remote   Breast cancer ([place])    Elevated liver enzymes    Family history of breast cancer    Family history of ovarian cancer    Family history of prostate cancer    Family history of stomach cancer    Hepatic steatosis    Hyperglycemia    A1c 7.6% 12/19/20   Migraines    PCOS (polycystic ovarian syndrome)    Pre-diabetes     Past Surgical History:  Procedure Laterality Date   MASTECTOMY W/ SENTINEL NODE BIOPSY Right 01/31/2021   Procedure: RIGHT SIMPLE MASTECTOMY;  Surgeon: Erroll Luna, MD;  Location: [facility];  Service: General;  Laterality: Right;   MODIFIED MASTECTOMY Left 01/31/2021   Procedure: LEFT MODIFIED RADICAL MASTECTOMY;  Surgeon: Erroll Luna, MD;  Location: [facility];  Service: General;  Laterality: Left;   NO PAST SURGERIES     PORTACATH PLACEMENT Right 10/18/2020   Procedure: INSERTION PORT-A-CATH;  Surgeon: Erroll Luna, MD;  Location: [facility];  Service: General;  Laterality: Right;    There were no vitals filed for this visit.   Subjective Assessment - 03/14/21 0913     Subjective I was a little sore after yesterday but it wasn't bad and I don't even feel it today. 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 It is ER/PR positive and HER2 negative with a Ki67 of 15%. She underwent neoadjuvant chemotherapy beginning 10/24/2020 for 2 rounds which was stopped due to elevated LFTs. She had a bilateral mastectomy with left axillary node dissection on 01/31/2021 with 15/15 nodes positive. She started Anastrozole on 12/06/2020. 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    Patient Stated Goals Get my arms back to normal    Currently in Pain? No/denies                               [facility] Adult PT Treatment/Exercise - 03/14/21 0001       Shoulder Exercises: Pulleys   Flexion 2 minutes    Flexion Limitations Pt returned therapist demo    ABduction 2 minutes    ABduction Limitations Pt returned therapist demo      Shoulder Exercises: Therapy Ball   Flexion Both;10 reps    Flexion Limitations Pt  returned therapist dmeo and VCs to not allow pull at open area of incision      Manual Therapy   Manual Therapy Myofascial release;Passive ROM;Soft tissue mobilization    Soft tissue mobilization Gently to Lt pectoralis insertion with cocoa butter (pt will have time to wash this off before radiation today) with arm relaxed in radiation position    Myofascial Release In Supine to Lt axilla during P/ROM and UE pulling during bil shoulders P/ROM, was mindful of not creating a pull at Lt lateral incision at open area    Passive ROM In supine to bil shoulders but focused more on Lt into flexion, abduction and D2 to pts tolerance being mindful of not pulling at Lt lateral incision                          PT Long Term Goals - 02/22/21 1055       PT LONG TERM GOAL #1   Title Patient will be independent in her HEP  for improving shoulder ROM and function.    Baseline No knowledge    Time 4    Period Weeks    Status New    Target Date 03/22/21      PT LONG TERM GOAL #2   Title Patient will increase bilateral shoulder flexion AROM to >/= 146 degrees for increased ease reaching.    Baseline Right shoulder 110 degrees post op; 153 pre-op; left shoulder 98 degrees post op; 146 pre-op    Time 4    Period Weeks    Status New    Target Date 03/22/21      PT LONG TERM GOAL #3   Title Patient will increase bilateral shoulder abduction AROM to >/= 150 degrees for increased ease reaching.    Baseline Right shoulder 157 degrees pre-op and 128 post op; left shoulder 152 degrees pre-op and 64 degrees post op    Time 4    Period Weeks    Status New    Target Date 03/22/21      PT LONG TERM GOAL #4   Title Patient will improve her DASH score for increased overall upper extremity function to </= 11.36    Baseline 11.36 pre-op and 22.73 post op    Time 4    Period Weeks    Status New    Target Date 03/22/21      PT LONG TERM GOAL #5   Title Patient will verbalize good understanding of lymphedema risk reduction practices.    Baseline No knowledge    Time 4    Period Weeks    Status New    Target Date 03/22/21                   Plan - 03/14/21 1000     Clinical Impression Statement Continued with manual therapy working on decreasing mild tightness at end ROM's of bil shoulders. Also focused on STM to Lt pectoralis insertion where pt reports has felt tight since "pulling" it shortly after surgery. Also progressed pt to include AA/ROM with pulleys and ball roll up wall. She tolerated both very well reporting feeling good stretches with both.    Stability/Clinical Decision Making Stable/Uncomplicated    PT Frequency 2x / week    PT Duration 4 weeks    PT Treatment/Interventions ADLs/Self Care Home Management;Therapeutic exercise;Manual techniques;Manual lymph drainage;Patient/family  education;Scar mobilization;Passive range of motion    PT Next Visit Plan Cont  PROM bilateral shoulders, add AAROM exercises with pulleys and ball roll up wall but be mondful of open area at Lt lateral incision; review supine scapular series prn    PT Home Exercise Plan Post op shoulder ROM HEP; supine scapular series with yellow theraband    Consulted and Agree with Plan of Care Patient             Patient will benefit from skilled therapeutic intervention in order to improve the following deficits and impairments:  Postural dysfunction, Decreased range of motion, Decreased knowledge of precautions, Impaired UE functional use, Pain, Increased fascial restricitons, Decreased strength, Increased edema, Decreased scar mobility, Decreased skin integrity  Visit Diagnosis: Ductal carcinoma in situ (DCIS) of right breast  Malignant neoplasm of upper-outer quadrant of left breast in female, estrogen receptor positive (HCC)  Abnormal posture  Stiffness of left shoulder, not elsewhere classified  Stiffness of right shoulder, not elsewhere classified     Problem List Patient Active Problem List   Diagnosis Date Noted   Left breast cancer with T3 tumor, >5 cm in greatest dimension ([place]) 01/31/2021   Bone metastases ([place]) 12/28/2020   Port-A-Cath in place 12/06/2020   Genetic testing 11/25/2020   Goals of care, counseling/discussion 10/25/2020   Lytic bone lesions on xray 10/16/2020   Family history of ovarian cancer    Family history of prostate cancer    Family history of breast cancer    Family history of stomach cancer    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   Malignant neoplasm of upper-outer quadrant of left breast in female, estrogen receptor positive ([place]) 09/29/2020   Elevated LFTs     Otelia Limes, PTA 03/14/2021, 10:04 AM  [facility] @ [place], [place],  [address] Phone: [phone]   Fax:  [phone]  Name: Virginia Curl MRN: [id number] Date of Birth: 02/02/1991

## 2021-03-15 ENCOUNTER — Other Ambulatory Visit: Payer: Self-pay

## 2021-03-15 ENCOUNTER — Ambulatory Visit
Admission: RE | Admit: 2021-03-15 | Discharge: 2021-03-15 | Disposition: A | Discharge status: Home / Self Care | Payer: Medicaid Other | Source: Ambulatory Visit | Attending: Radiation Oncology | Admitting: Radiation Oncology

## 2021-03-15 DIAGNOSIS — D0511 Intraductal carcinoma in situ of right breast: Secondary | ICD-10-CM | POA: Diagnosis not present

## 2021-03-15 NOTE — Progress Notes (Signed)
Pt here for patient teaching.  Pt given Radiation and You booklet, skin care instructions, Alra deodorant, and Radiaplex gel.  Reviewed areas of pertinence such as fatigue, hair loss, skin changes, breast tenderness, and breast swelling . Pt able to give teach back of to pat skin and use unscented/gentle soap,apply Radiaplex bid, avoid applying anything to skin within 4 hours of treatment, avoid wearing an under wire bra, and to use an electric razor if they must shave. Pt verbalizes understanding of information given and will contact nursing with any questions or concerns.     [email or web address]/treatmentinformation/whattoexpect/index  Mayjor Ager M. Bolton Canupp RN, BSN       

## 2021-03-16 ENCOUNTER — Ambulatory Visit
Admission: RE | Admit: 2021-03-16 | Discharge: 2021-03-16 | Disposition: A | Discharge status: Home / Self Care | Payer: Medicaid Other | Source: Ambulatory Visit | Attending: Radiation Oncology | Admitting: Radiation Oncology

## 2021-03-16 DIAGNOSIS — D0511 Intraductal carcinoma in situ of right breast: Secondary | ICD-10-CM | POA: Diagnosis not present

## 2021-03-16 DIAGNOSIS — Z170 Estrogen receptor positive status [ER+]: Secondary | ICD-10-CM

## 2021-03-16 DIAGNOSIS — C50412 Malignant neoplasm of upper-outer quadrant of left female breast: Secondary | ICD-10-CM

## 2021-03-16 MED ORDER — RADIAPLEXRX EX GEL: Freq: Once | CUTANEOUS | Status: AC

## 2021-03-16 MED ORDER — ALRA NON-METALLIC DEODORANT (RAD-ONC)
1.0000 "application " | Freq: Once | TOPICAL | Status: AC
  Administered 2021-03-16: 1 via TOPICAL

## 2021-03-19 ENCOUNTER — Ambulatory Visit
Admission: RE | Admit: 2021-03-19 | Discharge: 2021-03-19 | Disposition: A | Discharge status: Home / Self Care | Payer: Medicaid Other | Source: Ambulatory Visit | Attending: Radiation Oncology | Admitting: Radiation Oncology

## 2021-03-19 ENCOUNTER — Other Ambulatory Visit: Payer: Self-pay

## 2021-03-19 DIAGNOSIS — D0511 Intraductal carcinoma in situ of right breast: Secondary | ICD-10-CM | POA: Diagnosis not present

## 2021-03-20 ENCOUNTER — Other Ambulatory Visit: Payer: Self-pay

## 2021-03-20 ENCOUNTER — Ambulatory Visit
Admission: RE | Admit: 2021-03-20 | Discharge: 2021-03-20 | Disposition: A | Discharge status: Home / Self Care | Payer: Medicaid Other | Source: Ambulatory Visit | Attending: Radiation Oncology | Admitting: Radiation Oncology

## 2021-03-20 ENCOUNTER — Ambulatory Visit: Payer: Medicaid Other

## 2021-03-20 DIAGNOSIS — C50412 Malignant neoplasm of upper-outer quadrant of left female breast: Secondary | ICD-10-CM

## 2021-03-20 DIAGNOSIS — D0511 Intraductal carcinoma in situ of right breast: Secondary | ICD-10-CM | POA: Diagnosis not present

## 2021-03-20 DIAGNOSIS — M25611 Stiffness of right shoulder, not elsewhere classified: Secondary | ICD-10-CM

## 2021-03-20 DIAGNOSIS — R293 Abnormal posture: Secondary | ICD-10-CM

## 2021-03-20 DIAGNOSIS — M25612 Stiffness of left shoulder, not elsewhere classified: Secondary | ICD-10-CM

## 2021-03-20 NOTE — Therapy (Signed)
[facility] @ [place], [place], [address] Phone: [phone]   Fax:  [phone]  Physical Therapy Treatment  Patient Details  Name: Gabriela Sullivan MRN: [id number] Date of Birth: [birth year] Referring Provider (PT): Dr. Erroll Luna   Encounter Date: 03/20/2021   PT End of Session - 03/20/21 1156     Visit Number 5    Number of Visits 10    Date for PT Re-Evaluation 03/22/21    PT Start Time 0906    PT Stop Time 1005    PT Time Calculation (min) 59 min    Activity Tolerance Patient tolerated treatment well    Behavior During Therapy [facility] for tasks assessed/performed             Past Medical History:  Diagnosis Date   Asthma    remote   Breast cancer ([place])    Elevated liver enzymes    Family history of breast cancer    Family history of ovarian cancer    Family history of prostate cancer    Family history of stomach cancer    Hepatic steatosis    Hyperglycemia    A1c 7.6% 12/19/20   Migraines    PCOS (polycystic ovarian syndrome)    Pre-diabetes     Past Surgical History:  Procedure Laterality Date   MASTECTOMY W/ SENTINEL NODE BIOPSY Right 01/31/2021   Procedure: RIGHT SIMPLE MASTECTOMY;  Surgeon: Erroll Luna, MD;  Location: [facility];  Service: General;  Laterality: Right;   MODIFIED MASTECTOMY Left 01/31/2021   Procedure: LEFT MODIFIED RADICAL MASTECTOMY;  Surgeon: Erroll Luna, MD;  Location: [facility];  Service: General;  Laterality: Left;   NO PAST SURGERIES     PORTACATH PLACEMENT Right 10/18/2020   Procedure: INSERTION PORT-A-CATH;  Surgeon: Erroll Luna, MD;  Location: [facility];  Service: General;  Laterality: Right;    There were no vitals filed for this visit.   Subjective Assessment - 03/20/21 0911     Subjective I had my first 4 days of radiation lsat week and it went really well. I had no problem getting into position and the small spot in my incision that  was slow to heal is doing well also.    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 It is ER/PR positive and HER2 negative with a Ki67 of 15%. She underwent neoadjuvant chemotherapy beginning 10/24/2020 for 2 rounds which was stopped due to elevated LFTs. She had a bilateral mastectomy with left axillary node dissection on 01/31/2021 with 15/15 nodes positive. She started Anastrozole on 12/06/2020. 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    Patient Stated Goals Get my arms back to normal    Currently in Pain? No/denies                [facility] PT Assessment - 03/20/21 0001       AROM   Right Shoulder Extension 72 Degrees    Right Shoulder Flexion 158 Degrees    Right Shoulder ABduction 164 Degrees    Left Shoulder Extension 66 Degrees    Left Shoulder Flexion 158 Degrees    Left Shoulder ABduction 164 Degrees                           [facility] Adult PT Treatment/Exercise - 03/20/21  0001       Shoulder Exercises: Standing   Other Standing Exercises Standing with back against wall for bil UE 3 way raises with 1 lb into flexion, scaption and abduction to shoulder height only returning therapist demo      Shoulder Exercises: Pulleys   Flexion 2 minutes    ABduction 2 minutes      Shoulder Exercises: Therapy Ball   Flexion Both;10 reps   forward lean into end of stretch   ABduction Both;10 reps   same side leans into ends of stretches     Shoulder Exercises: Stretch   Wall Stretch - Flexion 5 reps   5 sec; modified downward dog on wall   Wall Stretch - ABduction 5 reps   5 sec; "snow angel" with back against wall   Other Shoulder Stretches Childs pose on mat table 5x in 3 directions, 5 sec holds each      Manual Therapy   Manual Therapy Myofascial release;Passive ROM;Soft tissue mobilization      Prosthetics   Prosthetic Care Comments   Measured pt for custom compression flat knit sleeve and glove/gauntlet                          PT Long Term Goals - 02/22/21 1055       PT LONG TERM GOAL #1   Title Patient will be independent in her HEP for improving shoulder ROM and function.    Baseline No knowledge    Time 4    Period Weeks    Status New    Target Date 03/22/21      PT LONG TERM GOAL #2   Title Patient will increase bilateral shoulder flexion AROM to >/= 146 degrees for increased ease reaching.    Baseline Right shoulder 110 degrees post op; 153 pre-op; left shoulder 98 degrees post op; 146 pre-op    Time 4    Period Weeks    Status New    Target Date 03/22/21      PT LONG TERM GOAL #3   Title Patient will increase bilateral shoulder abduction AROM to >/= 150 degrees for increased ease reaching.    Baseline Right shoulder 157 degrees pre-op and 128 post op; left shoulder 152 degrees pre-op and 64 degrees post op    Time 4    Period Weeks    Status New    Target Date 03/22/21      PT LONG TERM GOAL #4   Title Patient will improve her DASH score for increased overall upper extremity function to </= 11.36    Baseline 11.36 pre-op and 22.73 post op    Time 4    Period Weeks    Status New    Target Date 03/22/21      PT LONG TERM GOAL #5   Title Patient will verbalize good understanding of lymphedema risk reduction practices.    Baseline No knowledge    Time 4    Period Weeks    Status New    Target Date 03/22/21                   Plan - 03/20/21 1157     Clinical Impression Statement Pt began radiation last week and reports this is going well so far. She has no issues getting into radiation position and her incision seems to be healing well at Lt side lateral most aspect. Progressed exercises today to include  bil UE stretches and gentle strength with bil UE 3 way raises. Then discussed compression garments and pt is interested in pursuing this since she has had an ALND  making her at an increased risk of developing lymphedema. So spent rest of session measuring her for a custom flat knit medi garment (due to the circumference of her upper arm she does not fit into an OTS).    Stability/Clinical Decision Making Stable/Uncomplicated    PT Frequency 2x / week    PT Duration 4 weeks    PT Treatment/Interventions ADLs/Self Care Home Management;Therapeutic exercise;Manual techniques;Manual lymph drainage;Patient/family education;Scar mobilization;Passive range of motion    PT Next Visit Plan Cont PROM bilateral shoulders, cont AAROM exercises with pulleys and ball roll up wall and other UE stretches; review supine scapular series prn    PT Home Exercise Plan Post op shoulder ROM HEP; supine scapular series with yellow theraband    Consulted and Agree with Plan of Care Patient             Patient will benefit from skilled therapeutic intervention in order to improve the following deficits and impairments:  Postural dysfunction, Decreased range of motion, Decreased knowledge of precautions, Impaired UE functional use, Pain, Increased fascial restricitons, Decreased strength, Increased edema, Decreased scar mobility, Decreased skin integrity  Visit Diagnosis: Ductal carcinoma in situ (DCIS) of right breast  Malignant neoplasm of upper-outer quadrant of left breast in female, estrogen receptor positive (HCC)  Abnormal posture  Stiffness of left shoulder, not elsewhere classified  Stiffness of right shoulder, not elsewhere classified     Problem List Patient Active Problem List   Diagnosis Date Noted   Left breast cancer with T3 tumor, >5 cm in greatest dimension ([place]) 01/31/2021   Bone metastases ([place]) 12/28/2020   Port-A-Cath in place 12/06/2020   Genetic testing 11/25/2020   Goals of care, counseling/discussion 10/25/2020   Lytic bone lesions on xray 10/16/2020   Family history of ovarian cancer    Family history of prostate cancer    Family  history of breast cancer    Family history of stomach cancer    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   Malignant neoplasm of upper-outer quadrant of left breast in female, estrogen receptor positive ([place]) 09/29/2020   Elevated LFTs     Otelia Limes, PTA 03/20/2021, 12:02 PM  [facility] @ [place], [place], [address] Phone: [phone]   Fax:  [phone]  Name: Gabriela Sullivan MRN: [id number] Date of Birth: 1990/11/13

## 2021-03-21 ENCOUNTER — Ambulatory Visit
Admission: RE | Admit: 2021-03-21 | Discharge: 2021-03-21 | Disposition: A | Discharge status: Home / Self Care | Payer: Medicaid Other | Source: Ambulatory Visit | Attending: Radiation Oncology | Admitting: Radiation Oncology

## 2021-03-21 DIAGNOSIS — D0511 Intraductal carcinoma in situ of right breast: Secondary | ICD-10-CM | POA: Diagnosis not present

## 2021-03-22 ENCOUNTER — Ambulatory Visit
Admission: RE | Admit: 2021-03-22 | Discharge: 2021-03-22 | Disposition: A | Discharge status: Home / Self Care | Payer: Medicaid Other | Source: Ambulatory Visit | Attending: Radiation Oncology | Admitting: Radiation Oncology

## 2021-03-22 ENCOUNTER — Other Ambulatory Visit: Payer: Self-pay

## 2021-03-22 DIAGNOSIS — D0511 Intraductal carcinoma in situ of right breast: Secondary | ICD-10-CM | POA: Diagnosis not present

## 2021-03-23 ENCOUNTER — Ambulatory Visit
Admission: RE | Admit: 2021-03-23 | Discharge: 2021-03-23 | Disposition: A | Discharge status: Home / Self Care | Payer: Medicaid Other | Source: Ambulatory Visit | Attending: Radiation Oncology | Admitting: Radiation Oncology

## 2021-03-23 DIAGNOSIS — D0511 Intraductal carcinoma in situ of right breast: Secondary | ICD-10-CM | POA: Diagnosis not present

## 2021-03-26 ENCOUNTER — Ambulatory Visit
Admission: RE | Admit: 2021-03-26 | Discharge: 2021-03-26 | Disposition: A | Discharge status: Home / Self Care | Payer: Medicaid Other | Source: Ambulatory Visit | Attending: Radiation Oncology | Admitting: Radiation Oncology

## 2021-03-26 ENCOUNTER — Other Ambulatory Visit: Payer: Self-pay

## 2021-03-26 DIAGNOSIS — D0511 Intraductal carcinoma in situ of right breast: Secondary | ICD-10-CM | POA: Diagnosis not present

## 2021-03-26 NOTE — Progress Notes (Signed)
Beth, pls provide a new lab order for a hepatic panel, Hep A total antibody and Hep B surface antibody level. DX elevated LFTS.  Cancel the prior order (which was for only a hepatic panel). She can get the labs done next 1 to 2 weeks is fine. Thx

## 2021-03-27 ENCOUNTER — Other Ambulatory Visit: Payer: Self-pay

## 2021-03-27 ENCOUNTER — Other Ambulatory Visit (INDEPENDENT_AMBULATORY_CARE_PROVIDER_SITE_OTHER): Payer: Medicaid Other

## 2021-03-27 ENCOUNTER — Ambulatory Visit
Admission: RE | Admit: 2021-03-27 | Discharge: 2021-03-27 | Disposition: A | Discharge status: Home / Self Care | Payer: Medicaid Other | Source: Ambulatory Visit | Attending: Radiation Oncology | Admitting: Radiation Oncology

## 2021-03-27 ENCOUNTER — Ambulatory Visit: Payer: Medicaid Other

## 2021-03-27 ENCOUNTER — Telehealth: Payer: Self-pay

## 2021-03-27 DIAGNOSIS — R7989 Other specified abnormal findings of blood chemistry: Secondary | ICD-10-CM

## 2021-03-27 DIAGNOSIS — R293 Abnormal posture: Secondary | ICD-10-CM

## 2021-03-27 DIAGNOSIS — C50412 Malignant neoplasm of upper-outer quadrant of left female breast: Secondary | ICD-10-CM

## 2021-03-27 DIAGNOSIS — Z170 Estrogen receptor positive status [ER+]: Secondary | ICD-10-CM

## 2021-03-27 DIAGNOSIS — D0511 Intraductal carcinoma in situ of right breast: Secondary | ICD-10-CM | POA: Diagnosis not present

## 2021-03-27 DIAGNOSIS — M25611 Stiffness of right shoulder, not elsewhere classified: Secondary | ICD-10-CM

## 2021-03-27 DIAGNOSIS — K7581 Nonalcoholic steatohepatitis (NASH): Secondary | ICD-10-CM

## 2021-03-27 DIAGNOSIS — M25612 Stiffness of left shoulder, not elsewhere classified: Secondary | ICD-10-CM

## 2021-03-27 LAB — HEPATIC FUNCTION PANEL
ALT: 72 U/L — ABNORMAL HIGH (ref 0–35)
AST: 41 U/L — ABNORMAL HIGH (ref 0–37)
Albumin: 4.5 g/dL (ref 3.5–5.2)
Alkaline Phosphatase: 40 U/L (ref 39–117)
Bilirubin, Direct: 0.1 mg/dL (ref 0.0–0.3)
Total Bilirubin: 0.4 mg/dL (ref 0.2–1.2)
Total Protein: 7.3 g/dL (ref 6.0–8.3)

## 2021-03-27 NOTE — Therapy (Signed)
[facility] @ [facility], [place], [address] Phone: [phone]   Fax:  [phone]  Physical Therapy Treatment  Patient Details  Name: Gabriela Sullivan MRN: [id number] Date of Birth: 08/15/1990 Referring Provider (PT): Dr. Erroll Luna   Encounter Date: 03/27/2021   PT End of Session - 03/27/21 1000     Visit Number 6    Number of Visits 10    Date for PT Re-Evaluation 05/08/21    PT Start Time 0856    PT Stop Time 0956    PT Time Calculation (min) 60 min    Activity Tolerance Patient tolerated treatment well    Behavior During Therapy [facility] for tasks assessed/performed             Past Medical History:  Diagnosis Date   Asthma    remote   Breast cancer ([place])    Elevated liver enzymes    Family history of breast cancer    Family history of ovarian cancer    Family history of prostate cancer    Family history of stomach cancer    Hepatic steatosis    Hyperglycemia    A1c 7.6% 12/19/20   Migraines    PCOS (polycystic ovarian syndrome)    Pre-diabetes     Past Surgical History:  Procedure Laterality Date   MASTECTOMY W/ SENTINEL NODE BIOPSY Right 01/31/2021   Procedure: RIGHT SIMPLE MASTECTOMY;  Surgeon: Erroll Luna, MD;  Location: [employer];  Service: General;  Laterality: Right;   MODIFIED MASTECTOMY Left 01/31/2021   Procedure: LEFT MODIFIED RADICAL MASTECTOMY;  Surgeon: Erroll Luna, MD;  Location: [place];  Service: General;  Laterality: Left;   NO PAST SURGERIES     PORTACATH PLACEMENT Right 10/18/2020   Procedure: INSERTION PORT-A-CATH;  Surgeon: Erroll Luna, MD;  Location: [place];  Service: General;  Laterality: Right;    There were no vitals filed for this visit.   Subjective Assessment - 03/27/21 0903     Subjective I'm in my third week of radiation and it's going fine, I'm just feeling really tired.    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 It is ER/PR positive and HER2 negative with a Ki67 of 15%. She underwent neoadjuvant chemotherapy beginning 10/24/2020 for 2 rounds which was stopped due to elevated LFTs. She had a bilateral mastectomy with left axillary node dissection on 01/31/2021 with 15/15 nodes positive. She started Anastrozole on 12/06/2020. 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    Patient Stated Goals Get my arms back to normal    Currently in Pain? No/denies                               [facility] Adult PT Treatment/Exercise - 03/27/21 0001       Self-Care   Self-Care Other Self-Care Comments    Other Self-Care Comments  Educated pt in lymphedema risk reduction and infection prevention practices, also how to safely progress weights iwth exercises with the "low and low" mindset      Shoulder Exercises: Pulleys   Flexion 2 minutes    ABduction 2 minutes    ABduction Limitations VCs to remind pt of correct technique      Shoulder Exercises: Therapy Ball   Flexion Both;10 reps   Forward lean  into end of stretch with 1# on each wrist   ABduction Right;Left;10 reps   Same side lean into end of stretch with 1# on each wrist     Shoulder Exercises: Stretch   Other Shoulder Stretches Childs pose on mat table 3x in 3 directions, 5 sec holds each, pt struggles with lat weakness and has to "walk"herself up from each stretch so encouraged her to try lesser ROM and she was able to slide hands from there. Then encouraged her to slowly work to lower position as her muscle strength improves      Manual Therapy   Manual Therapy Myofascial release;Passive ROM;Soft tissue mobilization    Soft tissue mobilization Gently to Lt pectoralis insertion with arm relaxed in radiation position    Passive ROM In supine to Lt shoulder into flexion, abduction and D2 to pts tolerance being mindful of not pulling at Lt  lateral incision                          PT Long Term Goals - 03/27/21 1242       PT LONG TERM GOAL #1   Title Patient will be independent in her HEP for improving shoulder ROM and function.    Baseline No knowledge; pt is independent with initial HEP but will benefit from progression of this - 03/27/21    Status On-going      PT LONG TERM GOAL #2   Title Patient will increase bilateral shoulder flexion AROM to >/= 146 degrees for increased ease reaching.    Baseline Right shoulder 110 degrees post op; 153 pre-op; left shoulder 98 degrees post op; 146 pre-op; Rt and Lt 158 degrees - 03/27/21    Status Achieved      PT LONG TERM GOAL #3   Title Patient will increase bilateral shoulder abduction AROM to >/= 150 degrees for increased ease reaching.    Baseline Right shoulder 157 degrees pre-op and 128 post op; left shoulder 152 degrees pre-op and 64 degrees post op; Rt and Lt 164 degrees but pt reports pulling at pect with Lt end motion - 03/27/21    Status Achieved      PT LONG TERM GOAL #4   Title Patient will improve her DASH score for increased overall upper extremity function to </= 11.36    Baseline 11.36 pre-op and 22.73 post op    Status On-going      PT LONG TERM GOAL #5   Title Patient will verbalize good understanding of lymphedema risk reduction practices.    Baseline No knowledge; pt was instructed in this today and handout issued - 03/27/21    Status Achieved                   Plan - 03/27/21 1238     Clinical Impression Statement Medicaid and MD renewal done today. Pt is progressing very well towards her goals and reports feeling much improved with her end ROM of Rt>Lt shoulder. She is working towards independence with her HEP. Also still presents with Lt pectoralis insertion tightness that limits her end P/ROM. Pt will benefit from continued physical therapy at this time to cont work towards aforementioned deficits. Pt is also currently into  the 3rd out of 6.5 weeks of radiation and will possibly have new ROM defecits or fascial restrictions after this as well.    Stability/Clinical Decision Making Stable/Uncomplicated    PT Frequency 2x / week  PT Duration 6 weeks    PT Treatment/Interventions ADLs/Self Care Home Management;Therapeutic exercise;Manual techniques;Manual lymph drainage;Patient/family education;Scar mobilization;Passive range of motion    PT Next Visit Plan Cont PROM Lt>Rt shoulders, cont AAROM exercises with pulleys and ball roll up wall and other UE stretches; review supine scapular series prn; instruct in Strength ABC program    PT Home Exercise Plan Post op shoulder ROM HEP; supine scapular series with yellow theraband    Consulted and Agree with Plan of Care Patient             Patient will benefit from skilled therapeutic intervention in order to improve the following deficits and impairments:  Postural dysfunction, Decreased range of motion, Decreased knowledge of precautions, Impaired UE functional use, Pain, Increased fascial restricitons, Decreased strength, Increased edema, Decreased scar mobility, Decreased skin integrity  Visit Diagnosis: Ductal carcinoma in situ (DCIS) of right breast  Malignant neoplasm of upper-outer quadrant of left breast in female, estrogen receptor positive (HCC)  Abnormal posture  Stiffness of left shoulder, not elsewhere classified  Stiffness of right shoulder, not elsewhere classified     Problem List Patient Active Problem List   Diagnosis Date Noted   Left breast cancer with T3 tumor, >5 cm in greatest dimension ([place]) 01/31/2021   Bone metastases ([place]) 12/28/2020   Port-A-Cath in place 12/06/2020   Genetic testing 11/25/2020   Goals of care, counseling/discussion 10/25/2020   Lytic bone lesions on xray 10/16/2020   Family history of ovarian cancer    Family history of prostate cancer    Family history of breast cancer    Family history of stomach cancer     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   Malignant neoplasm of upper-outer quadrant of left breast in female, estrogen receptor positive ([place]) 09/29/2020   Elevated LFTs     Otelia Limes, PTA 03/27/2021, 12:57 PM  [facility] @ [facility] [place] [place], [place], [address] Phone: [phone]   Fax:  [phone]  Name: Gabriela Sullivan MRN: [id number] Date of Birth: 02-21-1991

## 2021-03-27 NOTE — Telephone Encounter (Signed)
Author: Noralyn Pick, NP Service: Gastroenterology Author Type: Nurse Practitioner  Filed: 03/26/2021  1:32 PM Encounter Date: 03/07/2021 Status: Signed  Editor: Noralyn Pick, NP (Nurse Practitioner)        Beth, pls provide a new lab order for a hepatic panel, Hep A total antibody and Hep B surface antibody level. DX elevated LFTS.  Cancel the prior order (which was for only a hepatic panel). She can get the labs done next 1 to 2 weeks is fine. Thx

## 2021-03-27 NOTE — Telephone Encounter (Signed)
-----   Message from Noralyn Pick, NP sent at 03/26/2021  1:30 PM EDT -----    ----- Message ----- From: Yetta Flock, MD Sent: 03/20/2021   1:57 PM EDT To: Noralyn Pick, NP  Okay thanks [place]. Appreciate the follow up. I think okay to repeat LFTs and see where they are trending and do the hep B / hep A immunity. Will see where her LFTs are trending, if you can keep me posted. Thanks   ----- Message ----- From: Noralyn Pick, NP Sent: 03/20/2021   8:48 AM EDT To: Yetta Flock, MD  Dr. Havery Moros, I did not order autoimmune liver disease serologies at the time of her office visit since she had a liver biopsy which confirmed NASH. Liver biopsy was done after AMA was positive.  I located the past hepatic serologies you requested. Refer to [facility] Liver clinic notes 08/2014 from [facility] found under media.   Labs 07/20/2014 from [facility]: Hepatitis C RNA undetectable. Iron within normal limits. AMA positive 1.34. ASMA negative. Hep B surface antigen negative. Hep B core total antibody nonreactive, hepatitis B surface antibody positive. Hepatitis A total antibody reactive.  HIV nonreactive. Quantitative immunoglobulins within normal limits. A1AT within normal limits. Ceruloplasmin within normal limits. ANA negative. LKM negative. Rheumatoid factor negative.  I will add Hep B surface antibody level and Hepatitis A total antibody levels to her next lab draw, after results received I will schedule her for Hep A vaccination and if her Hep B surface antibody is undetectable will also order Hep B vaccination.   Let me know if you want me to order any additional labs. I appreciate your input on this patient.   ----- Message ----- From: Yetta Flock, MD Sent: 03/10/2021   2:52 PM EDT To: Noralyn Pick, NP     ----- Message ----- From: Noralyn Pick, NP Sent: 03/10/2021   7:43 AM EDT To: Yetta Flock,  MD

## 2021-03-28 ENCOUNTER — Other Ambulatory Visit: Payer: Self-pay

## 2021-03-28 ENCOUNTER — Ambulatory Visit
Admission: RE | Admit: 2021-03-28 | Discharge: 2021-03-28 | Disposition: A | Discharge status: Home / Self Care | Payer: Medicaid Other | Source: Ambulatory Visit | Attending: Radiation Oncology | Admitting: Radiation Oncology

## 2021-03-28 DIAGNOSIS — D0511 Intraductal carcinoma in situ of right breast: Secondary | ICD-10-CM | POA: Diagnosis not present

## 2021-03-29 ENCOUNTER — Ambulatory Visit
Admission: RE | Admit: 2021-03-29 | Discharge: 2021-03-29 | Disposition: A | Discharge status: Home / Self Care | Payer: Medicaid Other | Source: Ambulatory Visit | Attending: Radiation Oncology | Admitting: Radiation Oncology

## 2021-03-29 ENCOUNTER — Ambulatory Visit: Payer: Medicaid Other

## 2021-03-29 ENCOUNTER — Other Ambulatory Visit: Payer: Medicaid Other

## 2021-03-29 DIAGNOSIS — R7989 Other specified abnormal findings of blood chemistry: Secondary | ICD-10-CM

## 2021-03-29 DIAGNOSIS — D0511 Intraductal carcinoma in situ of right breast: Secondary | ICD-10-CM | POA: Diagnosis not present

## 2021-03-30 ENCOUNTER — Ambulatory Visit
Admission: RE | Admit: 2021-03-30 | Discharge: 2021-03-30 | Disposition: A | Discharge status: Home / Self Care | Payer: Medicaid Other | Source: Ambulatory Visit | Attending: Radiation Oncology | Admitting: Radiation Oncology

## 2021-03-30 ENCOUNTER — Other Ambulatory Visit: Payer: Self-pay

## 2021-03-30 DIAGNOSIS — D0511 Intraductal carcinoma in situ of right breast: Secondary | ICD-10-CM | POA: Diagnosis not present

## 2021-03-30 LAB — HEPATITIS A ANTIBODY, TOTAL: Hepatitis A AB,Total: REACTIVE — AB

## 2021-03-30 LAB — HEPATITIS B SURFACE ANTIBODY,QUALITATIVE: Hep B S Ab: REACTIVE — AB

## 2021-04-02 ENCOUNTER — Other Ambulatory Visit: Payer: Self-pay

## 2021-04-02 ENCOUNTER — Ambulatory Visit
Admission: RE | Admit: 2021-04-02 | Discharge: 2021-04-02 | Disposition: A | Discharge status: Home / Self Care | Payer: Medicaid Other | Source: Ambulatory Visit | Attending: Radiation Oncology | Admitting: Radiation Oncology

## 2021-04-02 DIAGNOSIS — D0511 Intraductal carcinoma in situ of right breast: Secondary | ICD-10-CM | POA: Diagnosis not present

## 2021-04-03 ENCOUNTER — Ambulatory Visit: Payer: Medicaid Other

## 2021-04-03 ENCOUNTER — Other Ambulatory Visit: Payer: Self-pay

## 2021-04-03 ENCOUNTER — Ambulatory Visit
Admission: RE | Admit: 2021-04-03 | Discharge: 2021-04-03 | Disposition: A | Discharge status: Home / Self Care | Payer: Medicaid Other | Source: Ambulatory Visit | Attending: Radiation Oncology | Admitting: Radiation Oncology

## 2021-04-03 DIAGNOSIS — Z170 Estrogen receptor positive status [ER+]: Secondary | ICD-10-CM | POA: Insufficient documentation

## 2021-04-03 DIAGNOSIS — D0511 Intraductal carcinoma in situ of right breast: Secondary | ICD-10-CM | POA: Insufficient documentation

## 2021-04-03 DIAGNOSIS — R293 Abnormal posture: Secondary | ICD-10-CM | POA: Insufficient documentation

## 2021-04-03 DIAGNOSIS — Z510 Encounter for antineoplastic radiation therapy: Secondary | ICD-10-CM | POA: Insufficient documentation

## 2021-04-03 DIAGNOSIS — C50412 Malignant neoplasm of upper-outer quadrant of left female breast: Secondary | ICD-10-CM | POA: Insufficient documentation

## 2021-04-03 DIAGNOSIS — M25611 Stiffness of right shoulder, not elsewhere classified: Secondary | ICD-10-CM

## 2021-04-03 DIAGNOSIS — M25612 Stiffness of left shoulder, not elsewhere classified: Secondary | ICD-10-CM

## 2021-04-03 NOTE — Therapy (Signed)
[facility] @ [address] [place], [place], [address] Phone: [phone]   Fax:  [phone]  Physical Therapy Treatment  Patient Details  Name: Gabriela Sullivan MRN: [id number] Date of Birth: 11/05/1990 Referring Provider (PT): Dr. Erroll Luna   Encounter Date: 04/03/2021   PT End of Session - 04/03/21 1000     Visit Number 7    Number of Visits 10    Date for PT Re-Evaluation 05/08/21    Authorization Type Awaiting auth for request for more visits and to extend date to 05/08/21    PT Start Time 0909    PT Stop Time 0957    PT Time Calculation (min) 48 min    Activity Tolerance Patient tolerated treatment well    Behavior During Therapy [facility] for tasks assessed/performed             Past Medical History:  Diagnosis Date   Asthma    remote   Breast cancer ([place])    Elevated liver enzymes    Family history of breast cancer    Family history of ovarian cancer    Family history of prostate cancer    Family history of stomach cancer    Hepatic steatosis    Hyperglycemia    A1c 7.6% 12/19/20   Migraines    PCOS (polycystic ovarian syndrome)    Pre-diabetes     Past Surgical History:  Procedure Laterality Date   MASTECTOMY W/ SENTINEL NODE BIOPSY Right 01/31/2021   Procedure: RIGHT SIMPLE MASTECTOMY;  Surgeon: Erroll Luna, MD;  Location: [place];  Service: General;  Laterality: Right;   MODIFIED MASTECTOMY Left 01/31/2021   Procedure: LEFT MODIFIED RADICAL MASTECTOMY;  Surgeon: Erroll Luna, MD;  Location: [place];  Service: General;  Laterality: Left;   NO PAST SURGERIES     PORTACATH PLACEMENT Right 10/18/2020   Procedure: INSERTION PORT-A-CATH;  Surgeon: Erroll Luna, MD;  Location: [place];  Service: General;  Laterality: Right;    There were no vitals filed for this visit.   Subjective Assessment - 04/03/21 0950     Subjective I've had 17 radiation treatments so far. My skin is starting  to darken but no skin openings. I have noticed a small bump on my chest that I want you to look at.    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 It is ER/PR positive and HER2 negative with a Ki67 of 15%. She underwent neoadjuvant chemotherapy beginning 10/24/2020 for 2 rounds which was stopped due to elevated LFTs. She had a bilateral mastectomy with left axillary node dissection on 01/31/2021 with 15/15 nodes positive. She started Anastrozole on 12/06/2020. 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    Patient Stated Goals Get my arms back to normal    Currently in Pain? No/denies                               [facility] Adult PT Treatment/Exercise - 04/03/21 0001       Exercises   Exercises Other Exercises    Other Exercises  Instructed pt in Strength ABC program in it's entirety, pt was able to return therapist demo for all exs. Instructed her in Figure 4 pirirformis stretch in lieu of butterfly stretch and reverse curl in lieu of crunch.  Shoulder Exercises: Therapy Ball   Flexion Both;5 reps   forward lean into end of stretch   ABduction Right;Left;5 reps   same side lean into end of stretch     Manual Therapy   Manual therapy comments Inspected small bump pt had recently noted and this is in area near pectoral nodes so seems probable that this is a swollen lymph node due to the duress her Lt upper quadrant is under right now with currently undergoing radiation treatments. Also, though, instructed pt to show radiation tech at next appt so they are aware. She verbalized understanding.                          PT Long Term Goals - 03/27/21 1242       PT LONG TERM GOAL #1   Title Patient will be independent in her HEP for improving shoulder ROM and function.    Baseline No knowledge; pt is independent with initial HEP but will  benefit from progression of this - 03/27/21    Status On-going      PT LONG TERM GOAL #2   Title Patient will increase bilateral shoulder flexion AROM to >/= 146 degrees for increased ease reaching.    Baseline Right shoulder 110 degrees post op; 153 pre-op; left shoulder 98 degrees post op; 146 pre-op; Rt and Lt 158 degrees - 03/27/21    Status Achieved      PT LONG TERM GOAL #3   Title Patient will increase bilateral shoulder abduction AROM to >/= 150 degrees for increased ease reaching.    Baseline Right shoulder 157 degrees pre-op and 128 post op; left shoulder 152 degrees pre-op and 64 degrees post op; Rt and Lt 164 degrees but pt reports pulling at pect with Lt end motion - 03/27/21    Status Achieved      PT LONG TERM GOAL #4   Title Patient will improve her DASH score for increased overall upper extremity function to </= 11.36    Baseline 11.36 pre-op and 22.73 post op    Status On-going      PT LONG TERM GOAL #5   Title Patient will verbalize good understanding of lymphedema risk reduction practices.    Baseline No knowledge; pt was instructed in this today and handout issued - 03/27/21    Status Achieved                   Plan - 04/03/21 1001     Clinical Impression Statement Educated pt in Strength ABC Program. She did very well with returning demo of each exercise using correct technique and no discomfort reported with any of the exercises. Pt had area of concern to her so assessed this. See flowsheet, but seems like swollen lymph node due to current treatment. She is going to make radiation tech aware of this at next treatment. Pt reported feeling very good aftera ll exercises today. She was instructed to do strength portion only 2x/wk for now with no weights while undergoing radiation. Then can begin progressing with weights using the "low and slow" progression as she was instructed at previous session. Pt able to verbalize good understanding.    Stability/Clinical  Decision Making Stable/Uncomplicated    PT Frequency 2x / week    PT Duration 6 weeks    PT Treatment/Interventions ADLs/Self Care Home Management;Therapeutic exercise;Manual techniques;Manual lymph drainage;Patient/family education;Scar mobilization;Passive range of motion    PT Next Visit Plan  Review Strength ABC Program and assess Lt shoulder A/ROM while undergoing radiation.    PT Home Exercise Plan Post op shoulder ROM HEP; supine scapular series with yellow theraband; Strength ABC Program    Consulted and Agree with Plan of Care Patient             Patient will benefit from skilled therapeutic intervention in order to improve the following deficits and impairments:  Postural dysfunction, Decreased range of motion, Decreased knowledge of precautions, Impaired UE functional use, Pain, Increased fascial restricitons, Decreased strength, Increased edema, Decreased scar mobility, Decreased skin integrity  Visit Diagnosis: Ductal carcinoma in situ (DCIS) of right breast  Malignant neoplasm of upper-outer quadrant of left breast in female, estrogen receptor positive (HCC)  Abnormal posture  Stiffness of left shoulder, not elsewhere classified  Stiffness of right shoulder, not elsewhere classified     Problem List Patient Active Problem List   Diagnosis Date Noted   Left breast cancer with T3 tumor, >5 cm in greatest dimension ([place]) 01/31/2021   Bone metastases ([place]) 12/28/2020   Port-A-Cath in place 12/06/2020   Genetic testing 11/25/2020   Goals of care, counseling/discussion 10/25/2020   Lytic bone lesions on xray 10/16/2020   Family history of ovarian cancer    Family history of prostate cancer    Family history of breast cancer    Family history of stomach cancer    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   Malignant neoplasm of upper-outer quadrant of left breast in female, estrogen receptor positive ([place]) 09/29/2020   Elevated LFTs      Otelia Limes, PTA 04/03/2021, 10:05 AM  [facility] @ [address] [place], [place], [address] Phone: [phone]   Fax:  [phone]  Name: Gabriela Sullivan MRN: [id number] Date of Birth: 1991/02/06

## 2021-04-04 ENCOUNTER — Inpatient Hospital Stay: Payer: Medicaid Other

## 2021-04-04 ENCOUNTER — Ambulatory Visit
Admission: RE | Admit: 2021-04-04 | Discharge: 2021-04-04 | Disposition: A | Discharge status: Home / Self Care | Payer: Medicaid Other | Source: Ambulatory Visit | Attending: Radiation Oncology | Admitting: Radiation Oncology

## 2021-04-04 VITALS — BP 142/90 | HR 64 | Resp 17

## 2021-04-04 DIAGNOSIS — Z170 Estrogen receptor positive status [ER+]: Secondary | ICD-10-CM | POA: Insufficient documentation

## 2021-04-04 DIAGNOSIS — C50412 Malignant neoplasm of upper-outer quadrant of left female breast: Secondary | ICD-10-CM

## 2021-04-04 DIAGNOSIS — Z510 Encounter for antineoplastic radiation therapy: Secondary | ICD-10-CM | POA: Diagnosis not present

## 2021-04-04 DIAGNOSIS — Z95828 Presence of other vascular implants and grafts: Secondary | ICD-10-CM

## 2021-04-04 LAB — CBC WITH DIFFERENTIAL/PLATELET
Abs Immature Granulocytes: 0 10*3/uL (ref 0.00–0.07)
Basophils Absolute: 0 10*3/uL (ref 0.0–0.1)
Basophils Relative: 1 %
Eosinophils Absolute: 0.1 10*3/uL (ref 0.0–0.5)
Eosinophils Relative: 3 %
HCT: 38.6 % (ref 36.0–46.0)
Hemoglobin: 13.7 g/dL (ref 12.0–15.0)
Immature Granulocytes: 0 %
Lymphocytes Relative: 25 %
Lymphs Abs: 1 10*3/uL (ref 0.7–4.0)
MCH: 28.4 pg (ref 26.0–34.0)
MCHC: 35.5 g/dL (ref 30.0–36.0)
MCV: 79.9 fL — ABNORMAL LOW (ref 80.0–100.0)
Monocytes Absolute: 0.3 10*3/uL (ref 0.1–1.0)
Monocytes Relative: 8 %
Neutro Abs: 2.5 10*3/uL (ref 1.7–7.7)
Neutrophils Relative %: 63 %
Platelets: 280 10*3/uL (ref 150–400)
RBC: 4.83 MIL/uL (ref 3.87–5.11)
RDW: 12.8 % (ref 11.5–15.5)
WBC: 4 10*3/uL (ref 4.0–10.5)
nRBC: 0 % (ref 0.0–0.2)

## 2021-04-04 LAB — COMPREHENSIVE METABOLIC PANEL
ALT: 97 U/L — ABNORMAL HIGH (ref 0–44)
AST: 58 U/L — ABNORMAL HIGH (ref 15–41)
Albumin: 4.2 g/dL (ref 3.5–5.0)
Alkaline Phosphatase: 49 U/L (ref 38–126)
Anion gap: 11 (ref 5–15)
BUN: 9 mg/dL (ref 6–20)
CO2: 24 mmol/L (ref 22–32)
Calcium: 9 mg/dL (ref 8.9–10.3)
Chloride: 109 mmol/L (ref 98–111)
Creatinine, Ser: 0.57 mg/dL (ref 0.44–1.00)
GFR, Estimated: >60 mL/min (ref >60)
Glucose, Bld: 117 mg/dL — ABNORMAL HIGH (ref 70–99)
Potassium: 3.4 mmol/L — ABNORMAL LOW (ref 3.5–5.1)
Sodium: 144 mmol/L (ref 135–145)
Total Bilirubin: 0.8 mg/dL (ref 0.3–1.2)
Total Protein: 7.2 g/dL (ref 6.5–8.1)

## 2021-04-04 MED ORDER — GOSERELIN ACETATE 3.6 MG ~~LOC~~ IMPL
3.6000 mg | DRUG_IMPLANT | Freq: Once | SUBCUTANEOUS | Status: AC
  Administered 2021-04-04: 3.6 mg via SUBCUTANEOUS
  Filled 2021-04-04: qty 3.6

## 2021-04-04 NOTE — Patient Instructions (Signed)
Goserelin injection What is this medication? GOSERELIN (GOE se rel in) is similar to a hormone found in the body. It lowers the amount of sex hormones that the body makes. Men will have lower testosterone levels and women will have lower estrogen levels while taking this medicine. In men, this medicine is used to treat prostate cancer; the injection is either given once per month or once every 12 weeks. A once per month injection (only) is used to treat women with endometriosis, dysfunctional uterine bleeding, or advanced breast cancer. This medicine may be used for other purposes; ask your health care provider or pharmacist if you have questions. COMMON BRAND NAME(S): Zoladex What should I tell my care team before I take this medication? They need to know if you have any of these conditions: bone problems diabetes heart disease history of irregular heartbeat an unusual or allergic reaction to goserelin, other medicines, foods, dyes, or preservatives pregnant or trying to get pregnant breast-feeding How should I use this medication? This medicine is for injection under the skin. It is given by a health care professional in a hospital or clinic setting. Talk to your pediatrician regarding the use of this medicine in children. Special care may be needed. Overdosage: If you think you have taken too much of this medicine contact a poison control center or emergency room at once. NOTE: This medicine is only for you. Do not share this medicine with others. What if I miss a dose? It is important not to miss your dose. Call your doctor or health care professional if you are unable to keep an appointment. What may interact with this medication? Do not take this medicine with any of the following medications: cisapride dronedarone pimozide thioridazine This medicine may also interact with the following medications: other medicines that prolong the QT interval (an abnormal heart rhythm) This list  may not describe all possible interactions. Give your health care provider a list of all the medicines, herbs, non-prescription drugs, or dietary supplements you use. Also tell them if you smoke, drink alcohol, or use illegal drugs. Some items may interact with your medicine. What should I watch for while using this medication? Visit your doctor or health care provider for regular checks on your progress. Your symptoms may appear to get worse during the first weeks of this therapy. Tell your doctor or healthcare provider if your symptoms do not start to get better or if they get worse after this time. Your bones may get weaker if you take this medicine for a long time. If you smoke or frequently drink alcohol you may increase your risk of bone loss. A family history of osteoporosis, chronic use of drugs for seizures (convulsions), or corticosteroids can also increase your risk of bone loss. Talk to your doctor about how to keep your bones strong. This medicine should stop regular monthly menstruation in women. Tell your doctor if you continue to menstruate. Women should not become pregnant while taking this medicine or for 12 weeks after stopping this medicine. Women should inform their doctor if they wish to become pregnant or think they might be pregnant. There is a potential for serious side effects to an unborn child. Talk to your health care professional or pharmacist for more information. Do not breast-feed an infant while taking this medicine. Men should inform their doctors if they wish to father a child. This medicine may lower sperm counts. Talk to your health care professional or pharmacist for more information. This medicine may   increase blood sugar. Ask your healthcare provider if changes in diet or medicines are needed if you have diabetes. What side effects may I notice from receiving this medication? Side effects that you should report to your doctor or health care professional as soon as  possible: allergic reactions like skin rash, itching or hives, swelling of the face, lips, or tongue bone pain breathing problems changes in vision chest pain feeling faint or lightheaded, falls fever, chills pain, swelling, warmth in the leg pain, tingling, numbness in the hands or feet signs and symptoms of high blood sugar such as being more thirsty or hungry or having to urinate more than normal. You may also feel very tired or have blurry vision signs and symptoms of low blood pressure like dizziness; feeling faint or lightheaded, falls; unusually weak or tired stomach pain swelling of the ankles, feet, hands trouble passing urine or change in the amount of urine unusually high or low blood pressure unusually weak or tired Side effects that usually do not require medical attention (report to your doctor or health care professional if they continue or are bothersome): change in sex drive or performance changes in breast size in both males and females changes in emotions or moods headache hot flashes irritation at site where injected loss of appetite skin problems like acne, dry skin vaginal dryness This list may not describe all possible side effects. Call your doctor for medical advice about side effects. You may report side effects to FDA at 1-800-FDA-1088. Where should I keep my medication? This drug is given in a hospital or clinic and will not be stored at home. NOTE: This sheet is a summary. It may not cover all possible information. If you have questions about this medicine, talk to your doctor, pharmacist, or health care provider.  2022 Elsevier/Gold Standard (2018-09-07 14:05:56)  

## 2021-04-05 ENCOUNTER — Ambulatory Visit
Admission: RE | Admit: 2021-04-05 | Discharge: 2021-04-05 | Disposition: A | Discharge status: Home / Self Care | Payer: Medicaid Other | Source: Ambulatory Visit | Attending: Radiation Oncology | Admitting: Radiation Oncology

## 2021-04-05 ENCOUNTER — Other Ambulatory Visit: Payer: Self-pay

## 2021-04-05 DIAGNOSIS — Z510 Encounter for antineoplastic radiation therapy: Secondary | ICD-10-CM | POA: Diagnosis not present

## 2021-04-06 ENCOUNTER — Ambulatory Visit
Admission: RE | Admit: 2021-04-06 | Discharge: 2021-04-06 | Disposition: A | Discharge status: Home / Self Care | Payer: Medicaid Other | Source: Ambulatory Visit | Attending: Radiation Oncology | Admitting: Radiation Oncology

## 2021-04-06 DIAGNOSIS — Z510 Encounter for antineoplastic radiation therapy: Secondary | ICD-10-CM | POA: Diagnosis not present

## 2021-04-09 ENCOUNTER — Ambulatory Visit
Admission: RE | Admit: 2021-04-09 | Discharge: 2021-04-09 | Disposition: A | Discharge status: Home / Self Care | Payer: Medicaid Other | Source: Ambulatory Visit | Attending: Radiation Oncology | Admitting: Radiation Oncology

## 2021-04-09 ENCOUNTER — Other Ambulatory Visit: Payer: Self-pay

## 2021-04-09 DIAGNOSIS — Z510 Encounter for antineoplastic radiation therapy: Secondary | ICD-10-CM | POA: Diagnosis not present

## 2021-04-10 ENCOUNTER — Encounter: Payer: Medicaid Other | Admitting: Rehabilitation

## 2021-04-10 ENCOUNTER — Ambulatory Visit
Admission: RE | Admit: 2021-04-10 | Discharge: 2021-04-10 | Disposition: A | Discharge status: Home / Self Care | Payer: Medicaid Other | Source: Ambulatory Visit | Attending: Radiation Oncology | Admitting: Radiation Oncology

## 2021-04-10 DIAGNOSIS — Z510 Encounter for antineoplastic radiation therapy: Secondary | ICD-10-CM | POA: Diagnosis not present

## 2021-04-11 ENCOUNTER — Ambulatory Visit
Admission: RE | Admit: 2021-04-11 | Discharge: 2021-04-11 | Disposition: A | Discharge status: Home / Self Care | Payer: Medicaid Other | Source: Ambulatory Visit | Attending: Radiation Oncology | Admitting: Radiation Oncology

## 2021-04-11 ENCOUNTER — Other Ambulatory Visit: Payer: Self-pay

## 2021-04-11 DIAGNOSIS — Z510 Encounter for antineoplastic radiation therapy: Secondary | ICD-10-CM | POA: Diagnosis not present

## 2021-04-12 ENCOUNTER — Ambulatory Visit
Admission: RE | Admit: 2021-04-12 | Discharge: 2021-04-12 | Disposition: A | Discharge status: Home / Self Care | Payer: Medicaid Other | Source: Ambulatory Visit | Attending: Radiation Oncology | Admitting: Radiation Oncology

## 2021-04-12 ENCOUNTER — Encounter: Payer: Medicaid Other | Admitting: Rehabilitation

## 2021-04-12 DIAGNOSIS — Z510 Encounter for antineoplastic radiation therapy: Secondary | ICD-10-CM | POA: Diagnosis not present

## 2021-04-13 ENCOUNTER — Ambulatory Visit: Payer: Medicaid Other | Admitting: Radiation Oncology

## 2021-04-13 ENCOUNTER — Other Ambulatory Visit: Payer: Self-pay

## 2021-04-13 ENCOUNTER — Ambulatory Visit
Admission: RE | Admit: 2021-04-13 | Discharge: 2021-04-13 | Disposition: A | Discharge status: Home / Self Care | Payer: Medicaid Other | Source: Ambulatory Visit | Attending: Radiation Oncology | Admitting: Radiation Oncology

## 2021-04-13 DIAGNOSIS — Z510 Encounter for antineoplastic radiation therapy: Secondary | ICD-10-CM | POA: Diagnosis not present

## 2021-04-16 ENCOUNTER — Ambulatory Visit: Payer: Medicaid Other

## 2021-04-16 DIAGNOSIS — Z510 Encounter for antineoplastic radiation therapy: Secondary | ICD-10-CM | POA: Diagnosis not present

## 2021-04-17 ENCOUNTER — Ambulatory Visit
Admission: RE | Admit: 2021-04-17 | Discharge: 2021-04-17 | Disposition: A | Discharge status: Home / Self Care | Payer: Medicaid Other | Source: Ambulatory Visit | Attending: Radiation Oncology | Admitting: Radiation Oncology

## 2021-04-17 ENCOUNTER — Other Ambulatory Visit: Payer: Self-pay

## 2021-04-17 ENCOUNTER — Ambulatory Visit: Payer: Medicaid Other | Admitting: Rehabilitation

## 2021-04-17 DIAGNOSIS — C50412 Malignant neoplasm of upper-outer quadrant of left female breast: Secondary | ICD-10-CM

## 2021-04-17 DIAGNOSIS — R293 Abnormal posture: Secondary | ICD-10-CM

## 2021-04-17 DIAGNOSIS — D0511 Intraductal carcinoma in situ of right breast: Secondary | ICD-10-CM

## 2021-04-17 DIAGNOSIS — M25611 Stiffness of right shoulder, not elsewhere classified: Secondary | ICD-10-CM

## 2021-04-17 DIAGNOSIS — Z170 Estrogen receptor positive status [ER+]: Secondary | ICD-10-CM

## 2021-04-17 DIAGNOSIS — Z510 Encounter for antineoplastic radiation therapy: Secondary | ICD-10-CM | POA: Diagnosis not present

## 2021-04-17 DIAGNOSIS — M25612 Stiffness of left shoulder, not elsewhere classified: Secondary | ICD-10-CM

## 2021-04-17 NOTE — Therapy (Signed)
[facility] @ [address] [place], [place], [address] Phone: [phone]   Fax:  [phone]  Physical Therapy Treatment  Patient Details  Name: Gabriela Sullivan MRN: [id number] Date of Birth: 01/20/1991 Referring Provider (PT): Dr. Erroll Luna   Encounter Date: 04/17/2021   PT End of Session - 04/17/21 0920     Visit Number 8    Number of Visits 14    Date for PT Re-Evaluation 06/25/21    Authorization Type Ok 12 visits from 03/27/21-05/08/21    Authorization - Visit Number 3    Authorization - Number of Visits 12    PT Start Time 0900    PT Stop Time 0910    PT Time Calculation (min) 10 min    Activity Tolerance Treatment limited secondary to medical complications (Comment)    Behavior During Therapy [facility] for tasks assessed/performed             Past Medical History:  Diagnosis Date   Asthma    remote   Breast cancer ([place])    Elevated liver enzymes    Family history of breast cancer    Family history of ovarian cancer    Family history of prostate cancer    Family history of stomach cancer    Hepatic steatosis    Hyperglycemia    A1c 7.6% 12/19/20   Migraines    PCOS (polycystic ovarian syndrome)    Pre-diabetes     Past Surgical History:  Procedure Laterality Date   MASTECTOMY W/ SENTINEL NODE BIOPSY Right 01/31/2021   Procedure: RIGHT SIMPLE MASTECTOMY;  Surgeon: Erroll Luna, MD;  Location: [facility];  Service: General;  Laterality: Right;   MODIFIED MASTECTOMY Left 01/31/2021   Procedure: LEFT MODIFIED RADICAL MASTECTOMY;  Surgeon: Erroll Luna, MD;  Location: [facility];  Service: General;  Laterality: Left;   NO PAST SURGERIES     PORTACATH PLACEMENT Right 10/18/2020   Procedure: INSERTION PORT-A-CATH;  Surgeon: Erroll Luna, MD;  Location: [facility];  Service: General;  Laterality: Right;    There were no vitals filed for this visit.   Subjective Assessment - 04/17/21 0918      Subjective I have a week and a half left of radiation.  The sleeve does fit. 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 It is ER/PR positive and HER2 negative with a Ki67 of 15%. She underwent neoadjuvant chemotherapy beginning 10/24/2020 for 2 rounds which was stopped due to elevated LFTs. She had a bilateral mastectomy with left axillary node dissection on 01/31/2021 with 15/15 nodes positive. She started Anastrozole on 12/06/2020. 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    Currently in Pain? Yes    Pain Score 2     Pain Location Chest    Pain Orientation Left    Pain Descriptors / Indicators Burning    Pain Type Acute pain    Pain Onset In the past 7 days    Pain Frequency Constant                [facility] PT Assessment - 04/17/21 0001       Observation/Other Assessments   Observations Lt chest now red and starting to bleed near the lateral chest.  has gauze pad covering and using sports bra - discussed  POC and will hold and then start 1x per week x 6 weeks                                         PT Long Term Goals - 03/27/21 1242       PT LONG TERM GOAL #1   Title Patient will be independent in her HEP for improving shoulder ROM and function.    Baseline No knowledge; pt is independent with initial HEP but will benefit from progression of this - 03/27/21    Status On-going      PT LONG TERM GOAL #2   Title Patient will increase bilateral shoulder flexion AROM to >/= 146 degrees for increased ease reaching.    Baseline Right shoulder 110 degrees post op; 153 pre-op; left shoulder 98 degrees post op; 146 pre-op; Rt and Lt 158 degrees - 03/27/21    Status Achieved      PT LONG TERM GOAL #3   Title Patient will increase bilateral shoulder abduction AROM to >/= 150 degrees for  increased ease reaching.    Baseline Right shoulder 157 degrees pre-op and 128 post op; left shoulder 152 degrees pre-op and 64 degrees post op; Rt and Lt 164 degrees but pt reports pulling at pect with Lt end motion - 03/27/21    Status Achieved      PT LONG TERM GOAL #4   Title Patient will improve her DASH score for increased overall upper extremity function to </= 11.36    Baseline 11.36 pre-op and 22.73 post op    Status On-going      PT LONG TERM GOAL #5   Title Patient will verbalize good understanding of lymphedema risk reduction practices.    Baseline No knowledge; pt was instructed in this today and handout issued - 03/27/21    Status Achieved                   Plan - 04/17/21 0923     Clinical Impression Statement Pt has redness, pain and skin breakdown at the left chest radiation site so therapy visits were put on hold until 2 weeks post radiation.  Pt reports her new sleeve is fitting well and she is still trying to move and be active at home.  Overall feeling good except for skin currently.    PT Treatment/Interventions ADLs/Self Care Home Management;Therapeutic exercise;Manual techniques;Manual lymph drainage;Patient/family education;Scar mobilization;Passive range of motion    PT Next Visit Plan *Do SOZO* reassess post radiation - treat accordingly    Consulted and Agree with Plan of Care Patient             Patient will benefit from skilled therapeutic intervention in order to improve the following deficits and impairments:  Postural dysfunction, Decreased range of motion, Decreased knowledge of precautions, Impaired UE functional use, Pain, Increased fascial restricitons, Decreased strength, Increased edema, Decreased scar mobility, Decreased skin integrity  Visit Diagnosis: Ductal carcinoma in situ (DCIS) of right breast  Malignant neoplasm of upper-outer quadrant of left breast in female, estrogen receptor positive (HCC)  Abnormal posture  Stiffness  of left shoulder, not elsewhere classified  Stiffness of right shoulder, not elsewhere classified     Problem List Patient Active Problem List   Diagnosis Date Noted   Left breast cancer with T3 tumor, >5 cm in greatest dimension ([place]) 01/31/2021   Bone metastases ([place])  12/28/2020   Port-A-Cath in place 12/06/2020   Genetic testing 11/25/2020   Goals of care, counseling/discussion 10/25/2020   Lytic bone lesions on xray 10/16/2020   Family history of ovarian cancer    Family history of prostate cancer    Family history of breast cancer    Family history of stomach cancer    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   Malignant neoplasm of upper-outer quadrant of left breast in female, estrogen receptor positive ([place]) 09/29/2020   Elevated LFTs     Stark Bray, PT 04/17/2021, 9:26 AM  [facility] @ [address], [place], [address] Phone: [phone]   Fax:  [phone]  Name: Gabriela Sullivan MRN: [id number] Date of Birth: 1991-03-02

## 2021-04-18 ENCOUNTER — Ambulatory Visit
Admission: RE | Admit: 2021-04-18 | Discharge: 2021-04-18 | Disposition: A | Discharge status: Home / Self Care | Payer: Medicaid Other | Source: Ambulatory Visit | Attending: Radiation Oncology | Admitting: Radiation Oncology

## 2021-04-18 DIAGNOSIS — Z510 Encounter for antineoplastic radiation therapy: Secondary | ICD-10-CM | POA: Diagnosis not present

## 2021-04-19 ENCOUNTER — Ambulatory Visit
Admission: RE | Admit: 2021-04-19 | Discharge: 2021-04-19 | Disposition: A | Discharge status: Home / Self Care | Payer: Medicaid Other | Source: Ambulatory Visit | Attending: Radiation Oncology | Admitting: Radiation Oncology

## 2021-04-19 ENCOUNTER — Encounter: Payer: Medicaid Other | Admitting: Rehabilitation

## 2021-04-19 ENCOUNTER — Other Ambulatory Visit: Payer: Self-pay

## 2021-04-19 DIAGNOSIS — Z510 Encounter for antineoplastic radiation therapy: Secondary | ICD-10-CM | POA: Diagnosis not present

## 2021-04-20 ENCOUNTER — Ambulatory Visit
Admission: RE | Admit: 2021-04-20 | Discharge: 2021-04-20 | Disposition: A | Discharge status: Home / Self Care | Payer: Medicaid Other | Source: Ambulatory Visit | Attending: Radiation Oncology | Admitting: Radiation Oncology

## 2021-04-20 ENCOUNTER — Other Ambulatory Visit: Payer: Self-pay

## 2021-04-20 DIAGNOSIS — Z510 Encounter for antineoplastic radiation therapy: Secondary | ICD-10-CM | POA: Diagnosis not present

## 2021-04-22 ENCOUNTER — Ambulatory Visit
Admission: RE | Admit: 2021-04-22 | Discharge: 2021-04-22 | Disposition: A | Discharge status: Home / Self Care | Payer: Medicaid Other | Source: Ambulatory Visit | Attending: Radiation Oncology | Admitting: Radiation Oncology

## 2021-04-22 DIAGNOSIS — Z510 Encounter for antineoplastic radiation therapy: Secondary | ICD-10-CM | POA: Diagnosis not present

## 2021-04-23 ENCOUNTER — Ambulatory Visit: Payer: Medicaid Other

## 2021-04-24 ENCOUNTER — Ambulatory Visit: Payer: Medicaid Other

## 2021-04-24 ENCOUNTER — Encounter: Payer: Self-pay | Admitting: *Deleted

## 2021-04-24 ENCOUNTER — Encounter: Payer: Medicaid Other | Admitting: Rehabilitation

## 2021-04-24 DIAGNOSIS — C50412 Malignant neoplasm of upper-outer quadrant of left female breast: Secondary | ICD-10-CM

## 2021-04-24 DIAGNOSIS — Z170 Estrogen receptor positive status [ER+]: Secondary | ICD-10-CM

## 2021-04-24 NOTE — Progress Notes (Signed)
Patient Care Team: [name], MD as PCP - General (Family Medicine) Rockwell Germany, RN as Oncology Nurse Navigator Mauro Kaufmann, RN as Oncology Nurse Navigator Erroll Luna, MD as Consulting Physician (General Surgery) Kyung Rudd, MD as Consulting Physician (Radiation Oncology) Nicholas Lose, MD as Consulting Physician (Hematology and Oncology)  DIAGNOSIS:    ICD-10-CM   1. Malignant neoplasm of upper-outer quadrant of left breast in female, estrogen receptor positive ([place])  C50.412 anastrozole (ARIMIDEX) 1 MG tablet   Z17.0 CT CHEST ABDOMEN PELVIS W CONTRAST    NM Bone Scan Whole Body      SUMMARY OF ONCOLOGIC HISTORY: Oncology History  Ductal carcinoma in situ (DCIS) of right breast  09/29/2020 Initial Diagnosis   Ductal carcinoma in situ (DCIS) of right breast   10/04/2020 Cancer Staging   Staging form: Breast, AJCC 8th Edition - Clinical: Stage 0 (cTis (DCIS), cN0, cM0) - Signed by Chauncey Cruel, MD on 10/04/2020    11/25/2020 Genetic Testing   Negative genetic testing:  No pathogenic variants detected on the [employer] CancerNext-Expanded + RNAinsight panel. The report date is 11/25/2020.   The CancerNext-Expanded + RNAinsight gene panel offered by [employer] and includes sequencing and rearrangement analysis for the following 77 genes: AIP, ALK, APC, ATM, AXIN2, BAP1, BARD1, BLM, BMPR1A, BRCA1, BRCA2, BRIP1, CDC73, CDH1, CDK4, CDKN1B, CDKN2A, CHEK2, CTNNA1, DICER1, FANCC, FH, FLCN, GALNT12, KIF1B, LZTR1, MAX, MEN1, MET, MLH1, MSH2, MSH3, MSH6, MUTYH, NBN, NF1, NF2, NTHL1, PALB2, PHOX2B, PMS2, POT1, PRKAR1A, PTCH1, PTEN, RAD51C, RAD51D, RB1, RECQL, RET, SDHA, SDHAF2, SDHB, SDHC, SDHD, SMAD4, SMARCA4, SMARCB1, SMARCE1, STK11, SUFU, TMEM127, TP53, TSC1, TSC2, VHL and XRCC2 (sequencing and deletion/duplication); EGFR, EGLN1, HOXB13, KIT, MITF, PDGFRA, POLD1 and POLE (sequencing only); EPCAM and GREM1 (deletion/duplication only). RNA data is routinely analyzed for  use in variant interpretation for all genes.   Malignant neoplasm of upper-outer quadrant of left breast in female, estrogen receptor positive ([place])  09/29/2020 Initial Diagnosis   Malignant neoplasm of upper-outer quadrant of left breast in female, estrogen receptor positive ([place])   10/04/2020 Cancer Staging   Staging form: Breast, AJCC 8th Edition - Clinical stage from 10/04/2020: Stage IIA (cT3, cN1(f), cM0, G2, ER+, PR+, HER2-) - Signed by Chauncey Cruel, MD on 10/04/2020 Stage prefix: Initial diagnosis Method of lymph node assessment: Core biopsy Histologic grading system: 3 grade system    10/25/2020 - 11/10/2020 Chemotherapy   2 cycles of Adriamycin and Cytoxan.  Chemo discontinued because of rising LFTs.        11/25/2020 Genetic Testing   Negative genetic testing:  No pathogenic variants detected on the [employer] CancerNext-Expanded + RNAinsight panel. The report date is 11/25/2020.   The CancerNext-Expanded + RNAinsight gene panel offered by [employer] and includes sequencing and rearrangement analysis for the following 77 genes: AIP, ALK, APC, ATM, AXIN2, BAP1, BARD1, BLM, BMPR1A, BRCA1, BRCA2, BRIP1, CDC73, CDH1, CDK4, CDKN1B, CDKN2A, CHEK2, CTNNA1, DICER1, FANCC, FH, FLCN, GALNT12, KIF1B, LZTR1, MAX, MEN1, MET, MLH1, MSH2, MSH3, MSH6, MUTYH, NBN, NF1, NF2, NTHL1, PALB2, PHOX2B, PMS2, POT1, PRKAR1A, PTCH1, PTEN, RAD51C, RAD51D, RB1, RECQL, RET, SDHA, SDHAF2, SDHB, SDHC, SDHD, SMAD4, SMARCA4, SMARCB1, SMARCE1, STK11, SUFU, TMEM127, TP53, TSC1, TSC2, VHL and XRCC2 (sequencing and deletion/duplication); EGFR, EGLN1, HOXB13, KIT, MITF, PDGFRA, POLD1 and POLE (sequencing only); EPCAM and GREM1 (deletion/duplication only). RNA data is routinely analyzed for use in variant interpretation for all genes.    Anti-estrogen oral therapy   anastrozole started 12/06/2020 goserelin started 10/11/2020  zolendronate  started 11/02/2018. Every 12 weeks.   01/31/2021 Surgery   Left mastectomy:  Residual multifocal IDC 2 cm with intermediate grade DCIS, 15/15 lymph nodes positive, margins negative, lymphovascular space invasion present, ER 60%, PR 10%, HER2 negative, Ki-67 15% Right mastectomy: Fibrocystic change no residual cancer or DCIS     CHIEF COMPLIANT: Follow-up of breast cancer  INTERVAL HISTORY: Gabriela Sullivan is a 30 y.o. with above-mentioned history of breast cancer currently on antiestrogen therapy with anastrozole. She presents to the clinic today for follow-up.   ALLERGIES:  has No Known Allergies.  MEDICATIONS:  Current Outpatient Medications  Medication Sig Dispense Refill   abemaciclib (VERZENIO) 100 MG tablet Take 1 tablet (100 mg total) by mouth 2 (two) times daily. Swallow tablets whole. Do not chew, crush, or split tablets before swallowing. 60 tablet 0   anastrozole (ARIMIDEX) 1 MG tablet Take 1 tablet (1 mg total) by mouth daily. 90 tablet 3   calcium carbonate (TUMS - DOSED IN MG ELEMENTAL CALCIUM) 500 MG chewable tablet Chew 2 tablets by mouth every other day.     cholecalciferol (VITAMIN D3) 25 MCG (1000 UNIT) tablet Take 1,000 Units by mouth daily.     Goserelin Acetate (ZOLADEX [place]) Inject 1 Dose into the skin every 30 (thirty) days.     ibuprofen (ADVIL) 800 MG tablet Take 1 tablet (800 mg total) by mouth every 8 (eight) hours as needed. 30 tablet 0   metFORMIN (GLUCOPHAGE-XR) 500 MG 24 hr tablet Take 1,000 mg by mouth every evening.     oxyCODONE (OXY IR/ROXICODONE) 5 MG immediate release tablet Take 1 tablet (5 mg total) by mouth every 6 (six) hours as needed for severe pain. 20 tablet 0   venlafaxine XR (EFFEXOR-XR) 37.5 MG 24 hr capsule Take 1 capsule (37.5 mg total) by mouth daily with breakfast. 90 capsule 4   Vitamin D, Ergocalciferol, (DRISDOL) 50000 UNITS CAPS capsule Take 50,000 Units by mouth every Monday.     No current facility-administered medications for this visit.    PHYSICAL EXAMINATION: ECOG PERFORMANCE STATUS: 1 - Symptomatic but  completely ambulatory  Vitals:   04/25/21 0932  BP: (!) 148/88  Pulse: 79  Resp: 17  Temp: 97.7 F (36.5 C)  SpO2: 98%   Filed Weights   04/25/21 0932  Weight: 185 lb 6.4 oz (84.1 kg)    BREAST: No palpable masses or nodules in either right or left breasts. No palpable axillary supraclavicular or infraclavicular adenopathy no breast tenderness or nipple discharge. (exam performed in the presence of a chaperone)  LABORATORY DATA:  I have reviewed the data as listed CMP Latest Ref Rng & Units 04/25/2021 04/04/2021 03/27/2021  Glucose 70 - 99 mg/dL 123(H) 117(H) -  BUN 6 - 20 mg/dL 9 9 -  Creatinine 0.44 - 1.00 mg/dL 0.57 0.57 -  Sodium 135 - 145 mmol/L 142 144 -  Potassium 3.5 - 5.1 mmol/L 3.6 3.4(L) -  Chloride 98 - 111 mmol/L 111 109 -  CO2 22 - 32 mmol/L 24 24 -  Calcium 8.9 - 10.3 mg/dL 9.0 9.0 -  Total Protein 6.5 - 8.1 g/dL 7.3 7.2 7.3  Total Bilirubin 0.3 - 1.2 mg/dL 0.3 0.8 0.4  Alkaline Phos 38 - 126 U/L 53 49 40  AST 15 - 41 U/L 41 58(H) 41(H)  ALT 0 - 44 U/L 85(H) 97(H) 72(H)    Lab Results  Component Value Date   WBC 4.3 04/25/2021   HGB 14.0 04/25/2021   HCT  40.7 04/25/2021   MCV 81.6 04/25/2021   PLT 311 04/25/2021   NEUTROABS 2.8 04/25/2021    ASSESSMENT & PLAN:  Malignant neoplasm of upper-outer quadrant of left breast in female, estrogen receptor positive [facility]) 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% 09/25/2020: Right breast biopsy: DCIS grade 1, ER/PR positive 10/06/2020  breast MRI showed a 11 cm area of malignancy, with a second 1.9 cm mass, and skin thickening concerning for inflammatory breast carcinoma; at least 6 abnormal lymph nodes noted 10/13/2020 CT chest shows clear lungs and no obvious liver involvement but multiple lytic bone lesions in addition to the findings and MRI above 10/21/2020 spine MRI: Shows widely scattered thoracic and lumbar vertebral body  metastases 10/24/2020: 2 cycles of dose dense Adriamycin and Cytoxan discontinued because of rising LFTs 12/01/2020: MRI abdomen: No evidence of metastatic disease in the liver 01/31/2021:Left mastectomy: Residual multifocal IDC 2 cm with intermediate grade DCIS, 15/15 lymph nodes positive, margins negative, lymphovascular space invasion present, ER 60%, PR 10%, HER2 negative, Ki-67 15% Right mastectomy: Fibrocystic change no residual cancer or DCIS  Current treatment: Anastrozole with goserelin and Zometa  Anastrozole toxicities: Tolerating the treatment extremely well without any problems or concerns. I recommended adding Verzenio to the treatment.  We will start her at 20 mg twice a day after radiation is complete.  Abemaciclib counseling: I discussed at length the risks and benefits of Abemaciclib in combination with letrozole. Adverse effects of Abemaciclib include decreasing neutrophil count, pneumonia, blood clots in lungs as well as nausea and GI symptoms. Side effects of letrozole include hot flashes, muscle aches and pains, uterine bleeding/spotting/cancer, osteoporosis, risk of blood clots.  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  I ordered CT chest abdomen pelvis and bone scan. Patient will see Jenny Reichmann in [redacted] weeks along with her injection appointments.    Orders Placed This Encounter  Procedures   CT CHEST ABDOMEN PELVIS W CONTRAST    Standing Status:   Future    Standing Expiration Date:   04/25/2022    Order Specific Question:   Is patient pregnant?    Answer:   No    Order Specific Question:   Preferred imaging location?    Answer:   [facility]    Order Specific Question:   Release to patient    Answer:   Immediate    Order Specific Question:   Is Oral Contrast requested for this exam?    Answer:   Yes, Per Radiology protocol    Order Specific Question:   Reason for Exam (SYMPTOM  OR DIAGNOSIS REQUIRED)     Answer:   Met breast cancer restaging   NM Bone Scan Whole Body    Standing Status:   Future    Standing Expiration Date:   04/25/2022    Order Specific Question:   If indicated for the ordered procedure, I authorize the administration of a radiopharmaceutical per Radiology protocol    Answer:   Yes    Order Specific Question:   Is the patient pregnant?    Answer:   No    Order Specific Question:   Preferred imaging location?    Answer:   [facility]    Order Specific Question:   Release to patient    Answer:   Immediate    The patient has a good understanding of  the overall plan. she agrees with it. she will call with any problems that may develop before the next visit here.  Total time spent: 45 mins including face to face time and time spent for planning, charting and coordination of care  Rulon Eisenmenger, MD, MPH 04/25/2021  I, Thana Ates, am acting as scribe for Dr. Nicholas Lose.  I have reviewed the above documentation for accuracy and completeness, and I agree with the above.

## 2021-04-25 ENCOUNTER — Inpatient Hospital Stay: Payer: Medicaid Other

## 2021-04-25 ENCOUNTER — Telehealth: Payer: Self-pay | Admitting: Pharmacy Technician

## 2021-04-25 ENCOUNTER — Ambulatory Visit: Payer: Medicaid Other

## 2021-04-25 ENCOUNTER — Other Ambulatory Visit (HOSPITAL_COMMUNITY): Payer: Self-pay

## 2021-04-25 ENCOUNTER — Other Ambulatory Visit: Payer: Self-pay

## 2021-04-25 ENCOUNTER — Telehealth: Payer: Self-pay

## 2021-04-25 ENCOUNTER — Encounter: Payer: Self-pay | Admitting: Oncology

## 2021-04-25 ENCOUNTER — Inpatient Hospital Stay (HOSPITAL_BASED_OUTPATIENT_CLINIC_OR_DEPARTMENT_OTHER): Payer: Medicaid Other | Admitting: Hematology and Oncology

## 2021-04-25 DIAGNOSIS — Z170 Estrogen receptor positive status [ER+]: Secondary | ICD-10-CM

## 2021-04-25 DIAGNOSIS — C50412 Malignant neoplasm of upper-outer quadrant of left female breast: Secondary | ICD-10-CM

## 2021-04-25 DIAGNOSIS — Z510 Encounter for antineoplastic radiation therapy: Secondary | ICD-10-CM | POA: Diagnosis not present

## 2021-04-25 DIAGNOSIS — Z95828 Presence of other vascular implants and grafts: Secondary | ICD-10-CM

## 2021-04-25 LAB — COMPREHENSIVE METABOLIC PANEL
ALT: 85 U/L — ABNORMAL HIGH (ref 0–44)
AST: 41 U/L (ref 15–41)
Albumin: 4.1 g/dL (ref 3.5–5.0)
Alkaline Phosphatase: 53 U/L (ref 38–126)
Anion gap: 7 (ref 5–15)
BUN: 9 mg/dL (ref 6–20)
CO2: 24 mmol/L (ref 22–32)
Calcium: 9 mg/dL (ref 8.9–10.3)
Chloride: 111 mmol/L (ref 98–111)
Creatinine, Ser: 0.57 mg/dL (ref 0.44–1.00)
GFR, Estimated: >60 mL/min (ref >60)
Glucose, Bld: 123 mg/dL — ABNORMAL HIGH (ref 70–99)
Potassium: 3.6 mmol/L (ref 3.5–5.1)
Sodium: 142 mmol/L (ref 135–145)
Total Bilirubin: 0.3 mg/dL (ref 0.3–1.2)
Total Protein: 7.3 g/dL (ref 6.5–8.1)

## 2021-04-25 LAB — CBC WITH DIFFERENTIAL/PLATELET
Abs Immature Granulocytes: 0.01 10*3/uL (ref 0.00–0.07)
Basophils Absolute: 0 10*3/uL (ref 0.0–0.1)
Basophils Relative: 1 %
Eosinophils Absolute: 0.2 10*3/uL (ref 0.0–0.5)
Eosinophils Relative: 5 %
HCT: 40.7 % (ref 36.0–46.0)
Hemoglobin: 14 g/dL (ref 12.0–15.0)
Immature Granulocytes: 0 %
Lymphocytes Relative: 19 %
Lymphs Abs: 0.8 10*3/uL (ref 0.7–4.0)
MCH: 28.1 pg (ref 26.0–34.0)
MCHC: 34.4 g/dL (ref 30.0–36.0)
MCV: 81.6 fL (ref 80.0–100.0)
Monocytes Absolute: 0.4 10*3/uL (ref 0.1–1.0)
Monocytes Relative: 9 %
Neutro Abs: 2.8 10*3/uL (ref 1.7–7.7)
Neutrophils Relative %: 66 %
Platelets: 311 10*3/uL (ref 150–400)
RBC: 4.99 MIL/uL (ref 3.87–5.11)
RDW: 13.1 % (ref 11.5–15.5)
WBC: 4.3 10*3/uL (ref 4.0–10.5)
nRBC: 0 % (ref 0.0–0.2)

## 2021-04-25 MED ORDER — SODIUM CHLORIDE 0.9 % IV SOLN: Freq: Once | INTRAVENOUS | Status: AC

## 2021-04-25 MED ORDER — SODIUM CHLORIDE 0.9% FLUSH
10.0000 mL | Freq: Once | INTRAVENOUS | Status: AC
  Administered 2021-04-25: 10 mL

## 2021-04-25 MED ORDER — ANASTROZOLE 1 MG PO TABS: 1.0000 mg | ORAL_TABLET | Freq: Every day | ORAL | 3 refills | Status: DC

## 2021-04-25 MED ORDER — ABEMACICLIB 100 MG PO TABS
100.0000 mg | ORAL_TABLET | Freq: Two times a day (BID) | ORAL | 0 refills | Status: DC
  Filled 2021-04-25: qty 60, 30d supply, fill #0

## 2021-04-25 MED ORDER — ZOLEDRONIC ACID 4 MG/100ML IV SOLN
4.0000 mg | Freq: Once | INTRAVENOUS | Status: AC
  Administered 2021-04-25: 4 mg via INTRAVENOUS
  Filled 2021-04-25: qty 100

## 2021-04-25 MED ORDER — GOSERELIN ACETATE 3.6 MG ~~LOC~~ IMPL
3.6000 mg | DRUG_IMPLANT | Freq: Once | SUBCUTANEOUS | Status: AC
  Administered 2021-04-25: 3.6 mg via SUBCUTANEOUS
  Filled 2021-04-25: qty 3.6

## 2021-04-25 MED ORDER — HEPARIN SOD (PORK) LOCK FLUSH 100 UNIT/ML IV SOLN
500.0000 [IU] | Freq: Once | INTRAVENOUS | Status: AC
  Administered 2021-04-25: 500 [IU]

## 2021-04-25 NOTE — Assessment & Plan Note (Addendum)
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% 09/25/2020: Right breast biopsy: DCIS grade 1, ER/PR positive 10/06/2020  breast MRI showed a 11 cm area of malignancy, with a second 1.9 cm mass, and skin thickening concerning for inflammatory breast carcinoma; at least 6 abnormal lymph nodes noted 10/13/2020 CT chest shows clear lungs and no obvious liver involvement but multiple lytic bone lesions in addition to the findings and MRI above 10/21/2020 spine MRI: Shows widely scattered thoracic and lumbar vertebral body metastases 10/24/2020: 2 cycles of dose dense Adriamycin and Cytoxan discontinued because of rising LFTs 12/01/2020: MRI abdomen: No evidence of metastatic disease in the liver 01/31/2021:Left mastectomy: Residual multifocal IDC 2 cm with intermediate grade DCIS, 15/15 lymph nodes positive, margins negative, lymphovascular space invasion present, ER 60%, PR 10%, HER2 negative, Ki-67 15% Right mastectomy: Fibrocystic change no residual cancer or DCIS  Current treatment: Anastrozole with goserelin and Zometa  Anastrozole toxicities: Tolerating the treatment extremely well without any problems or concerns. I recommended adding Verzenio to the treatment.  We will start her at 20 mg twice a day after radiation is complete.  Abemaciclib counseling: I discussed at length the risks and benefits of Abemaciclib in combination with letrozole. Adverse effects of Abemaciclib include decreasing neutrophil count, pneumonia, blood clots in lungs as well as nausea and GI symptoms. Side effects of letrozole include hot flashes, muscle aches and pains, uterine bleeding/spotting/cancer, osteoporosis, risk of blood clots.  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  I ordered CT chest abdomen pelvis and bone scan. Patient will see Jenny Reichmann in [redacted] weeks along with her injection  appointments.

## 2021-04-25 NOTE — Telephone Encounter (Signed)
Oral Oncology Patient Advocate Encounter  After completing a benefits investigation, prior authorization for Verzenio is not required at this time through [place].  Patient's copay is $0.00.    [facility] [place] Phone [phone] Fax [phone] 04/25/2021 10:41 AM

## 2021-04-25 NOTE — Patient Instructions (Signed)
Zoledronic Acid Injection (Hypercalcemia, Oncology) What is this medication? ZOLEDRONIC ACID (ZOE le dron ik AS id) slows calcium loss from bones. It high calcium levels in the blood from some kinds of cancer. It may be used in other people at risk for bone loss. This medicine may be used for other purposes; ask your health care provider or pharmacist if you have questions. COMMON BRAND NAME(S): Zometa What should I tell my care team before I take this medication? They need to know if you have any of these conditions: cancer dehydration dental disease kidney disease liver disease low levels of calcium in the blood lung or breathing disease (asthma) receiving steroids like dexamethasone or prednisone an unusual or allergic reaction to zoledronic acid, other medicines, foods, dyes, or preservatives pregnant or trying to get pregnant breast-feeding How should I use this medication? This drug is injected into a vein. It is given by a health care provider in a hospital or clinic setting. Talk to your health care provider about the use of this drug in children. Special care may be needed. Overdosage: If you think you have taken too much of this medicine contact a poison control center or emergency room at once. NOTE: This medicine is only for you. Do not share this medicine with others. What if I miss a dose? Keep appointments for follow-up doses. It is important not to miss your dose. Call your health care provider if you are unable to keep an appointment. What may interact with this medication? certain antibiotics given by injection NSAIDs, medicines for pain and inflammation, like ibuprofen or naproxen some diuretics like bumetanide, furosemide teriparatide thalidomide This list may not describe all possible interactions. Give your health care provider a list of all the medicines, herbs, non-prescription drugs, or dietary supplements you use. Also tell them if you smoke, drink alcohol, or  use illegal drugs. Some items may interact with your medicine. What should I watch for while using this medication? Visit your health care provider for regular checks on your progress. It may be some time before you see the benefit from this drug. Some people who take this drug have severe bone, joint, or muscle pain. This drug may also increase your risk for jaw problems or a broken thigh bone. Tell your health care provider right away if you have severe pain in your jaw, bones, joints, or muscles. Tell you health care provider if you have any pain that does not go away or that gets worse. Tell your dentist and dental surgeon that you are taking this drug. You should not have major dental surgery while on this drug. See your dentist to have a dental exam and fix any dental problems before starting this drug. Take good care of your teeth while on this drug. Make sure you see your dentist for regular follow-up appointments. You should make sure you get enough calcium and vitamin D while you are taking this drug. Discuss the foods you eat and the vitamins you take with your health care provider. Check with your health care provider if you have severe diarrhea, nausea, and vomiting, or if you sweat a lot. The loss of too much body fluid may make it dangerous for you to take this drug. You may need blood work done while you are taking this drug. Do not become pregnant while taking this drug. Women should inform their health care provider if they wish to become pregnant or think they might be pregnant. There is potential for serious  harm to an unborn child. Talk to your health care provider for more information. What side effects may I notice from receiving this medication? Side effects that you should report to your doctor or health care provider as soon as possible: allergic reactions (skin rash, itching or hives; swelling of the face, lips, or tongue) bone pain infection (fever, chills, cough, sore  throat, pain or trouble passing urine) jaw pain, especially after dental work joint pain kidney injury (trouble passing urine or change in the amount of urine) low blood pressure (dizziness; feeling faint or lightheaded, falls; unusually weak or tired) low calcium levels (fast heartbeat; muscle cramps or pain; pain, tingling, or numbness in the hands or feet; seizures) low magnesium levels (fast, irregular heartbeat; muscle cramp or pain; muscle weakness; tremors; seizures) low red blood cell counts (trouble breathing; feeling faint; lightheaded, falls; unusually weak or tired) muscle pain redness, blistering, peeling, or loosening of the skin, including inside the mouth severe diarrhea swelling of the ankles, feet, hands trouble breathing Side effects that usually do not require medical attention (report to your doctor or health care provider if they continue or are bothersome): anxious constipation coughing depressed mood eye irritation, itching, or pain fever general ill feeling or flu-like symptoms nausea pain, redness, or irritation at site where injected trouble sleeping This list may not describe all possible side effects. Call your doctor for medical advice about side effects. You may report side effects to FDA at 1-800-FDA-1088. Where should I keep my medication? This drug is given in a hospital or clinic. It will not be stored at home. NOTE: This sheet is a summary. It may not cover all possible information. If you have questions about this medicine, talk to your doctor, pharmacist, or health care provider.  2022 Elsevier/Gold Standard (2021-02-06 00:00:00)  Goserelin injection What is this medication? GOSERELIN (GOE se rel in) is similar to a hormone found in the body. It lowers the amount of sex hormones that the body makes. Men will have lower testosterone levels and women will have lower estrogen levels while taking this medicine. In men, this medicine is used to treat  prostate cancer; the injection is either given once per month or once every 12 weeks. A once per month injection (only) is used to treat women with endometriosis, dysfunctional uterine bleeding, or advanced breast cancer. This medicine may be used for other purposes; ask your health care provider or pharmacist if you have questions. COMMON BRAND NAME(S): Zoladex, Zoladex 41-Month What should I tell my care team before I take this medication? They need to know if you have any of these conditions: bone problems diabetes heart disease history of irregular heartbeat an unusual or allergic reaction to goserelin, other medicines, foods, dyes, or preservatives pregnant or trying to get pregnant breast-feeding How should I use this medication? This medicine is for injection under the skin. It is given by a health care professional in a hospital or clinic setting. Talk to your pediatrician regarding the use of this medicine in children. Special care may be needed. Overdosage: If you think you have taken too much of this medicine contact a poison control center or emergency room at once. NOTE: This medicine is only for you. Do not share this medicine with others. What if I miss a dose? It is important not to miss your dose. Call your doctor or health care professional if you are unable to keep an appointment. What may interact with this medication? Do not take this medicine  with any of the following medications: cisapride dronedarone pimozide thioridazine This medicine may also interact with the following medications: other medicines that prolong the QT interval (an abnormal heart rhythm) This list may not describe all possible interactions. Give your health care provider a list of all the medicines, herbs, non-prescription drugs, or dietary supplements you use. Also tell them if you smoke, drink alcohol, or use illegal drugs. Some items may interact with your medicine. What should I watch for while  using this medication? Visit your doctor or health care provider for regular checks on your progress. Your symptoms may appear to get worse during the first weeks of this therapy. Tell your doctor or healthcare provider if your symptoms do not start to get better or if they get worse after this time. Your bones may get weaker if you take this medicine for a long time. If you smoke or frequently drink alcohol you may increase your risk of bone loss. A family history of osteoporosis, chronic use of drugs for seizures (convulsions), or corticosteroids can also increase your risk of bone loss. Talk to your doctor about how to keep your bones strong. This medicine should stop regular monthly menstruation in women. Tell your doctor if you continue to menstruate. Women should not become pregnant while taking this medicine or for 12 weeks after stopping this medicine. Women should inform their doctor if they wish to become pregnant or think they might be pregnant. There is a potential for serious side effects to an unborn child. Talk to your health care professional or pharmacist for more information. Do not breast-feed an infant while taking this medicine. Men should inform their doctors if they wish to father a child. This medicine may lower sperm counts. Talk to your health care professional or pharmacist for more information. This medicine may increase blood sugar. Ask your healthcare provider if changes in diet or medicines are needed if you have diabetes. What side effects may I notice from receiving this medication? Side effects that you should report to your doctor or health care professional as soon as possible: allergic reactions like skin rash, itching or hives, swelling of the face, lips, or tongue bone pain breathing problems changes in vision chest pain feeling faint or lightheaded, falls fever, chills pain, swelling, warmth in the leg pain, tingling, numbness in the hands or feet signs and  symptoms of high blood sugar such as being more thirsty or hungry or having to urinate more than normal. You may also feel very tired or have blurry vision signs and symptoms of low blood pressure like dizziness; feeling faint or lightheaded, falls; unusually weak or tired stomach pain swelling of the ankles, feet, hands trouble passing urine or change in the amount of urine unusually high or low blood pressure unusually weak or tired Side effects that usually do not require medical attention (report to your doctor or health care professional if they continue or are bothersome): change in sex drive or performance changes in breast size in both males and females changes in emotions or moods headache hot flashes irritation at site where injected loss of appetite skin problems like acne, dry skin vaginal dryness This list may not describe all possible side effects. Call your doctor for medical advice about side effects. You may report side effects to FDA at 1-800-FDA-1088. Where should I keep my medication? This drug is given in a hospital or clinic and will not be stored at home. NOTE: This sheet is a summary. It  may not cover all possible information. If you have questions about this medicine, talk to your doctor, pharmacist, or health care provider.  2022 Elsevier/Gold Standard (2018-09-18 00:00:00)

## 2021-04-25 NOTE — Progress Notes (Signed)
Per Dr. Lindi Adie, okay to give zoladex and zometa a week early.

## 2021-04-25 NOTE — Telephone Encounter (Signed)
Oral Oncology Pharmacist Encounter  Received new prescription for abemaciclib (Verzenio) for the treatment of early, high risk, node positive, estrogen receptor positive, HER2 negative breast cancer in conjunction with anastrozole, planned duration for 2 years of treatment or unacceptable toxicity.  Labs from 04/25/21 assessed, no interventions needed.  Current medication list in Epic reviewed, DDIs with verzenio identified:  - metformin (Cat c): monitor for increased metformin effects (can increase the concentration)   Evaluated chart and no patient barriers to medication adherence noted.   Patient agreement for treatment documented in MD note on 04/25/21.  Prescription has been e-scribed to the [facility] for benefits analysis and approval.  Oral Oncology Clinic will continue to follow for insurance authorization, copayment issues, initial counseling and start date.  Drema Halon, PharmD Hematology/Oncology Clinical Pharmacist [facility] [phone] 04/25/2021 10:42 AM

## 2021-04-30 ENCOUNTER — Ambulatory Visit
Admission: RE | Admit: 2021-04-30 | Discharge: 2021-04-30 | Disposition: A | Discharge status: Home / Self Care | Payer: Medicaid Other | Source: Ambulatory Visit | Attending: Radiation Oncology | Admitting: Radiation Oncology

## 2021-04-30 ENCOUNTER — Ambulatory Visit: Payer: Medicaid Other

## 2021-04-30 ENCOUNTER — Other Ambulatory Visit: Payer: Self-pay

## 2021-04-30 DIAGNOSIS — Z510 Encounter for antineoplastic radiation therapy: Secondary | ICD-10-CM | POA: Diagnosis not present

## 2021-05-01 ENCOUNTER — Ambulatory Visit: Payer: Medicaid Other

## 2021-05-01 ENCOUNTER — Ambulatory Visit
Admission: RE | Admit: 2021-05-01 | Discharge: 2021-05-01 | Disposition: A | Discharge status: Home / Self Care | Payer: Medicaid Other | Source: Ambulatory Visit | Attending: Radiation Oncology | Admitting: Radiation Oncology

## 2021-05-01 ENCOUNTER — Other Ambulatory Visit: Payer: Self-pay

## 2021-05-01 DIAGNOSIS — Z510 Encounter for antineoplastic radiation therapy: Secondary | ICD-10-CM | POA: Diagnosis not present

## 2021-05-02 ENCOUNTER — Ambulatory Visit
Admission: RE | Admit: 2021-05-02 | Discharge: 2021-05-02 | Disposition: A | Discharge status: Home / Self Care | Payer: Medicaid Other | Source: Ambulatory Visit | Attending: Radiation Oncology | Admitting: Radiation Oncology

## 2021-05-02 ENCOUNTER — Other Ambulatory Visit (HOSPITAL_COMMUNITY): Payer: Self-pay

## 2021-05-02 ENCOUNTER — Ambulatory Visit: Payer: Medicaid Other

## 2021-05-02 DIAGNOSIS — Z510 Encounter for antineoplastic radiation therapy: Secondary | ICD-10-CM | POA: Diagnosis not present

## 2021-05-02 MED ORDER — ABEMACICLIB 100 MG PO TABS
100.0000 mg | ORAL_TABLET | Freq: Two times a day (BID) | ORAL | 0 refills | Status: DC
  Filled 2021-05-02 – 2021-05-03 (×2): qty 56, 28d supply, fill #0

## 2021-05-02 NOTE — Telephone Encounter (Signed)
Oral Chemotherapy Pharmacist Encounter  I spoke with patient for overview of: Verzenio for the treatment of early, high risk, node positive, hormone-receptor positive breast cancer, in combination with anastrozole, planned duration for 2 years of treatment or unacceptable toxicity.  Counseled patient on administration, dosing, side effects, monitoring, drug-food interactions, safe handling, storage, and disposal.  Patient will take Verzenio 100mg  tablets, 1 tablet by mouth twice daily without regard to food.  Patient knows to avoid grapefruit and grapefruit juice.  Verzenio start date: 05/07/2021  Adverse effects include but are not limited to: diarrhea, fatigue, nausea, abdominal pain, decreased blood counts, and increased liver function tests, and joint pains. Severe, life-threatening, and/or fatal interstitial lung disease (ILD) and/or pneumonitis may occur with CDK 4/6 inhibitors.  Patient has anti-emetic on hand and knows to take it if nausea develops.   Patient will obtain anti diarrheal and alert the office of 4 or more loose stools above baseline.  Reviewed with patient importance of keeping a medication schedule and plan for any missed doses. No barriers to medication adherence identified.  Medication reconciliation performed and medication/allergy list updated.  Insurance authorization for [employer] has been obtained. Test claim at the pharmacy revealed copayment $0 for 1st fill of 28 days. Patient will pick up medication from the [facility] on 05/04/2021.   Patient informed the pharmacy will reach out 5-7 days prior to needing next fill of Verzenio to coordinate continued medication acquisition to prevent break in therapy.  All questions answered.  Mrs. Marzano voiced understanding and appreciation.   Medication education handout placed in mail for patient. Patient knows to call the office with questions or concerns. Oral Chemotherapy Clinic phone number  provided to patient.   Drema Halon, PharmD Hematology/Oncology Clinical Pharmacist [facility] [phone] 05/02/2021   1:32 PM

## 2021-05-03 ENCOUNTER — Other Ambulatory Visit: Payer: Self-pay

## 2021-05-03 ENCOUNTER — Other Ambulatory Visit (HOSPITAL_COMMUNITY): Payer: Self-pay

## 2021-05-03 ENCOUNTER — Ambulatory Visit
Admission: RE | Admit: 2021-05-03 | Discharge: 2021-05-03 | Disposition: A | Discharge status: Home / Self Care | Payer: Medicaid Other | Source: Ambulatory Visit | Attending: Radiation Oncology | Admitting: Radiation Oncology

## 2021-05-03 DIAGNOSIS — C50412 Malignant neoplasm of upper-outer quadrant of left female breast: Secondary | ICD-10-CM | POA: Insufficient documentation

## 2021-05-03 DIAGNOSIS — Z170 Estrogen receptor positive status [ER+]: Secondary | ICD-10-CM | POA: Diagnosis not present

## 2021-05-03 DIAGNOSIS — Z510 Encounter for antineoplastic radiation therapy: Secondary | ICD-10-CM | POA: Diagnosis present

## 2021-05-04 ENCOUNTER — Encounter: Payer: Self-pay | Admitting: Radiation Oncology

## 2021-05-04 ENCOUNTER — Ambulatory Visit
Admission: RE | Admit: 2021-05-04 | Discharge: 2021-05-04 | Disposition: A | Discharge status: Home / Self Care | Payer: Medicaid Other | Source: Ambulatory Visit | Attending: Radiation Oncology | Admitting: Radiation Oncology

## 2021-05-04 ENCOUNTER — Other Ambulatory Visit (HOSPITAL_COMMUNITY): Payer: Self-pay

## 2021-05-04 ENCOUNTER — Other Ambulatory Visit: Payer: Self-pay

## 2021-05-04 DIAGNOSIS — Z510 Encounter for antineoplastic radiation therapy: Secondary | ICD-10-CM | POA: Diagnosis not present

## 2021-05-07 ENCOUNTER — Encounter: Payer: Self-pay | Admitting: Gastroenterology

## 2021-05-07 ENCOUNTER — Ambulatory Visit (INDEPENDENT_AMBULATORY_CARE_PROVIDER_SITE_OTHER): Payer: Medicaid Other | Admitting: Gastroenterology

## 2021-05-07 VITALS — BP 118/80 | HR 81 | Ht 60.0 in | Wt 184.0 lb

## 2021-05-07 DIAGNOSIS — C50412 Malignant neoplasm of upper-outer quadrant of left female breast: Secondary | ICD-10-CM | POA: Diagnosis not present

## 2021-05-07 DIAGNOSIS — Z170 Estrogen receptor positive status [ER+]: Secondary | ICD-10-CM | POA: Diagnosis not present

## 2021-05-07 DIAGNOSIS — K7581 Nonalcoholic steatohepatitis (NASH): Secondary | ICD-10-CM

## 2021-05-07 DIAGNOSIS — R748 Abnormal levels of other serum enzymes: Secondary | ICD-10-CM | POA: Diagnosis not present

## 2021-05-07 NOTE — Patient Instructions (Addendum)
If you are age 31 or older, your body mass index should be between 23-30. Your Body mass index is 35.94 kg/m. If this is out of the aforementioned range listed, please consider follow up with your Primary Care Provider.  If you are age 50 or younger, your body mass index should be between 19-25. Your Body mass index is 35.94 kg/m. If this is out of the aformentioned range listed, please consider follow up with your Primary Care Provider.   ________________________________________________________  The [facility] providers would like to encourage you to use [facility] to communicate with providers for non-urgent requests or questions.  Due to long hold times on the telephone, sending your provider a message by [facility] may be a faster and more efficient way to get a response.  Please allow 48 business hours for a response.  Please remember that this is for non-urgent requests.  _______________________________________________________  Please follow up in 6 months  Thank you for entrusting me with your care and for choosing [facility], Dr. Julesburg Cellar

## 2021-05-07 NOTE — Progress Notes (Signed)
                                                                                                                                                              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  Intent: Curative  Radiation Treatment Dates: 03/14/2021 through 05/04/2021 Site Technique Total Dose (Gy) Dose per Fx (Gy) Completed Fx Beam Energies  Chest Wall, Left: CW_Lt 3D 50.4/50.4 1.8 28/28 10X  Chest Wall, Left: CW_Lt_SCLV 3D 50.4/50.4 1.8 28/28 6X, 10X  Chest Wall, Left: CW_Lt_Bst specialPort 10/10 2 5/5 6E   Narrative: The patient tolerated radiation therapy relatively well. She developed   anticipated skin changes in the treatment field but her skin was intact.  Plan: The patient will receive a call in about one month from the radiation oncology department. She will continue follow up with Dr. Lindi Adie as well.   ________________________________________________    Carola Rhine, [facility]

## 2021-05-07 NOTE — Progress Notes (Signed)
HPI :  30 year old female here for a follow-up visit to discuss her liver enzymes.  Recall she has a history of obesity, PCOS, DM II diagnosed this year, breast cancer stage IV initially diagnosed 09/2020 s/p bilateral mastectomy.  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  Recall she was initially seen by our office in October 2022, referred by Dr. Jana Hakim for elevated liver function testing.  Recall she had elevated liver enzymes with transaminitis in the low 100s back in 2016, had a negative serologic work-up by hepatology at that time and was thought to have NASH with grade 2 fibrosis based on liver biopsy.  In May of this year she was diagnosed with breast cancer, her baseline LFTs were also in the low 100s at that time.  She states she did not have any lab work done between 2016 and 2022. She underwent bilateral mastectomy and received chemo (Cyclophosphamide and Doxorubicin) infusions 5/25 and 11/10/2020 then discontinued due to rising LFTs -her ALT peaked on July 28 with a value of 515 and AST of 251.  Once these were withdrawn her AST went to 60 and ALT to 120, and have since have maintained at prior elevated levels. She was started on Venlafaxine XR 4-5 months ago. Started on Metformin 12/29/2020. No alcohol or drug use.   More recently she is on a regimen of anastrozole, goserelin, Zometa.  She was just started on Verzenio which she is about to start today.  She feels okay in general.  No abdominal pains. She underwent a liver MRI on 11/30/2020 which did not show any evidence of metastatic disease to the liver or intra/extrahepatic ductal dilatation but showed evidence of metastatic disease to the spine.    She states she has been working on losing weight with diet.  Since May she has lost about 16 pounds.  Most recently her liver enzymes show that her ALT is in the 80s and AST in 50s. She has another CT scan chest abdomen pelvis for her  malignancy work-up in the near future.  Prior work-up: Liver MRI w/wo contrast to screen for liver mets 11/30/2020: Lower chest: Unremarkable.   Hepatobiliary: Diffuse loss of signal intensity in the liver parenchyma on out of phase T1 imaging is compatible with fatty deposition. No suspicious enhancing liver lesion to suggest metastatic disease. No intrahepatic or extrahepatic biliary dilation. Gallbladder is nondistended.   Pancreas: No focal mass lesion. No dilatation of the main duct. No intraparenchymal cyst. No peripancreatic edema.   Spleen:  No splenomegaly. No focal mass lesion.   Adrenals/Urinary Tract: No adrenal nodule or mass. Tiny T2 hyperintensity in the upper pole right kidney is too small to characterize but likely a cyst. Left kidney unremarkable.   Stomach/Bowel: Stomach is unremarkable. No gastric wall thickening. No evidence of outlet obstruction. Duodenum is normally positioned as is the ligament of Treitz. No small bowel wall thickening. No small bowel dilatation.   Vascular/Lymphatic: No abdominal aortic aneurysm. There is no gastrohepatic or hepatoduodenal ligament lymphadenopathy. No retroperitoneal or mesenteric lymphadenopathy.   Other:  No intraperitoneal free fluid.   Musculoskeletal: Numerous tiny enhancing lesions in the spine are compatible with metastatic disease, better characterized on recent spine MRI of 10/21/2020.   IMPRESSION: 1. No evidence for metastatic disease to the liver. No intra or extrahepatic biliary duct dilatation. 2. Hepatic steatosis. 3. Numerous tiny enhancing lesions in the spine are compatible with metastatic disease, better characterized  on recent spine MRI of 10/21/2020.   Liver, needle/core biopsy 08/03/2014: - BENIGN LIVER WITH FEATURES OF STEATOHEPATITIS, SEE COMMENT. - TRICHROME STAIN DEMONSTRATES PORTAL AND SINUSOIDAL FIBROSIS. - IRON STAIN NEGATIVE FOR HEMOSIDERIN DEPOSITION.   03/27/21: ALT 72, AST 41,  AP 40, T bil 0.4 Hep A total AB (+) and hep B surface antibody (+)  04/04/21 ALT 97, AST 58, AP 49, T bil 0.8  04/25/21: ALT 85, AST 41, AP 53, T bil 0.3     Past Medical History:  Diagnosis Date   Asthma    remote   Breast cancer ([facility])    Elevated liver enzymes    Family history of breast cancer    Family history of ovarian cancer    Family history of prostate cancer    Family history of stomach cancer    Hepatic steatosis    Hyperglycemia    A1c 7.6% 12/19/20   Migraines    PCOS (polycystic ovarian syndrome)    Pre-diabetes      Past Surgical History:  Procedure Laterality Date   MASTECTOMY W/ SENTINEL NODE BIOPSY Right 01/31/2021   Procedure: RIGHT SIMPLE MASTECTOMY;  Surgeon: Erroll Luna, MD;  Location: [facility];  Service: General;  Laterality: Right;   MODIFIED MASTECTOMY Left 01/31/2021   Procedure: LEFT MODIFIED RADICAL MASTECTOMY;  Surgeon: Erroll Luna, MD;  Location: [facility];  Service: General;  Laterality: Left;   NO PAST SURGERIES     PORTACATH PLACEMENT Right 10/18/2020   Procedure: INSERTION PORT-A-CATH;  Surgeon: Erroll Luna, MD;  Location: [facility];  Service: General;  Laterality: Right;   Family History  Problem Relation Age of Onset   Ovarian cancer Maternal Grandmother        unknown age of diagnosis   Prostate cancer Maternal Grandfather        metastatic   Breast cancer Cousin        unknown age of diagnosis, maternal first cousin   Stomach cancer Cousin        dx 20s/30s, maternal first cousin   Social History   Tobacco Use   Smoking status: Never   Smokeless tobacco: Never  Vaping Use   Vaping Use: Never used  Substance Use Topics   Alcohol use: Never   Drug use: Never   Current Outpatient Medications  Medication Sig Dispense Refill   abemaciclib (VERZENIO) 100 MG tablet Take 1 tablet (100 mg total) by mouth 2 (two) times daily. Swallow tablets whole. Do not chew, crush, or split tablets before swallowing. 56  tablet 0   anastrozole (ARIMIDEX) 1 MG tablet Take 1 tablet (1 mg total) by mouth daily. 90 tablet 3   calcium carbonate (TUMS - DOSED IN MG ELEMENTAL CALCIUM) 500 MG chewable tablet Chew 2 tablets by mouth every other day.     cholecalciferol (VITAMIN D3) 25 MCG (1000 UNIT) tablet Take 1,000 Units by mouth daily.     Goserelin Acetate (ZOLADEX [place]) Inject 1 Dose into the skin every 30 (thirty) days.     metFORMIN (GLUCOPHAGE-XR) 500 MG 24 hr tablet Take 1,000 mg by mouth every evening.     venlafaxine XR (EFFEXOR-XR) 37.5 MG 24 hr capsule Take 1 capsule (37.5 mg total) by mouth daily with breakfast. 90 capsule 4   Vitamin D, Ergocalciferol, (DRISDOL) 50000 UNITS CAPS capsule Take 50,000 Units by mouth every Monday.     No current facility-administered medications for this visit.   No Known Allergies   Review of Systems: All  systems reviewed and negative except where noted in HPI.   Lab Results  Component Value Date   ALT 85 (H) 04/25/2021   AST 41 04/25/2021   ALKPHOS 53 04/25/2021   BILITOT 0.3 04/25/2021   Lab Results  Component Value Date   WBC 4.3 04/25/2021   HGB 14.0 04/25/2021   HCT 40.7 04/25/2021   MCV 81.6 04/25/2021   PLT 311 04/25/2021    Lab Results  Component Value Date   INR 1.10 08/03/2014      Physical Exam: BP 118/80   Pulse 81   Ht 5' (1.524 m)   Wt 184 lb (83.5 kg)   BMI 35.94 kg/m  Constitutional: Pleasant,well-developed, female in no acute distress. Neurological: Alert and oriented to person place and time. Psychiatric: Normal mood and affect. Behavior is normal.   ASSESSMENT AND PLAN: 30 year old female here for reassessment of the following:  NASH Elevated liver enzymes Metastatic breast cancer  As above patient with history of elevated enzymes dating back years.  She had a biopsy of her liver in 2016 showing NASH with grade 2 fibrosis.  Her baseline ALT was in the low 100s at that time.  No evaluation of her LFTs down until May 2022  at which time her enzymes were at the same level.  She had a spike in her LAE's related to chemotherapy which down trended to previous baseline after it was stopped.  She has lost some weight over the past several months and her ALT is actually a bit lower than it has been previously.  Her baseline ALT elevation is likely due to NASH, although need to keep a close eye on her liver enzymes moving forward especially while starting Verzenio which is high risk for elevation in liver enzymes and hepatotoxicity.  This will be monitored by Dr. Sonny Dandy.   In regards to her history of fibrotic change, persistence of ongoing elevation is concerning for disease progression.  That being said her platelet count is normal and her liver disease not show any evidence of cirrhosis on MRI.  We discussed the only way to reassess degree of fibrosis would be with a repeat liver biopsy or consider elastography which is not as accurate.  I do not think it will change her management at this time.  She is working on weight loss and hopefully she can continue to do so which should help her transaminitis.  We will await her repeat imaging for her malignancy and make sure her liver appears stable.  She is immune to hepatitis a and B.  We discussed drinking coffee routinely can help prevent fibrotic change in her liver, she will start doing this.  I would like to see her again in 6 months or sooner with any questions.  Plan: - close monitoring of LAEs at this point, especially on Verzenio, she states Dr. Lindi Adie is monitoring this - continued weight loss if possible - await follow up CT C/A/P for malignancy reassessment - encouraged liberal coffee intake - do not think she needs a liver biopsy at this point. She understands risks of cirrhosis with prior biopsy result, she should have liver imaging at least yearly to screen for cirrhosis - follow up in 6 months  Jolly Mango, MD [facility]

## 2021-05-08 ENCOUNTER — Encounter: Payer: Self-pay | Admitting: Oncology

## 2021-05-10 ENCOUNTER — Encounter: Payer: Self-pay | Admitting: Oncology

## 2021-05-14 ENCOUNTER — Ambulatory Visit: Payer: Self-pay

## 2021-05-15 ENCOUNTER — Other Ambulatory Visit (HOSPITAL_COMMUNITY): Payer: Self-pay

## 2021-05-16 ENCOUNTER — Ambulatory Visit (HOSPITAL_COMMUNITY)
Admission: RE | Admit: 2021-05-16 | Discharge: 2021-05-16 | Disposition: A | Discharge status: Home / Self Care | Payer: Medicaid Other | Source: Ambulatory Visit | Attending: Hematology and Oncology | Admitting: Hematology and Oncology

## 2021-05-16 DIAGNOSIS — C50412 Malignant neoplasm of upper-outer quadrant of left female breast: Secondary | ICD-10-CM | POA: Diagnosis not present

## 2021-05-16 DIAGNOSIS — Z170 Estrogen receptor positive status [ER+]: Secondary | ICD-10-CM | POA: Insufficient documentation

## 2021-05-16 IMAGING — CT CT CHEST-ABD-PELV W/ CM
2 of 4 series · 14 of 36 positions shown, 16 images · IV contrast (OMNIPAQUE)
Comparison: Comparison is made with [DATE].

CLINICAL DATA: Staging of metastatic breast cancer in a is
30-year-old female.

EXAM:
CT CHEST, ABDOMEN, AND PELVIS WITH CONTRAST
TECHNIQUE: Multidetector CT imaging of the chest, abdomen and pelvis was
performed following the standard protocol during bolus
administration of intravenous contrast.
CONTRAST:  75mL OMNIPAQUE IOHEXOL 350 MG/ML SOLN

[Series 2: cap with · axial · 0.94mm/px · z∈[+1134,+1634]mm · 11 of 121 slices shown, 13 images]
[im 11/121  mediastinal]
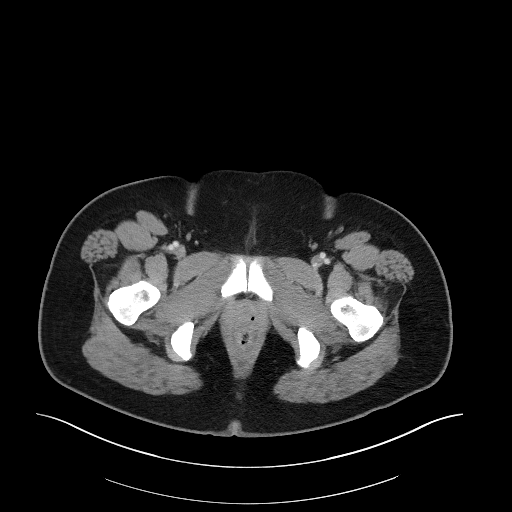
[im 11/121  bone]
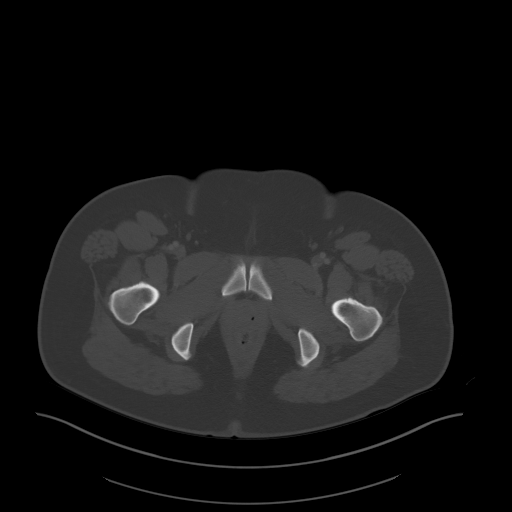
[im 21/121  mediastinal]
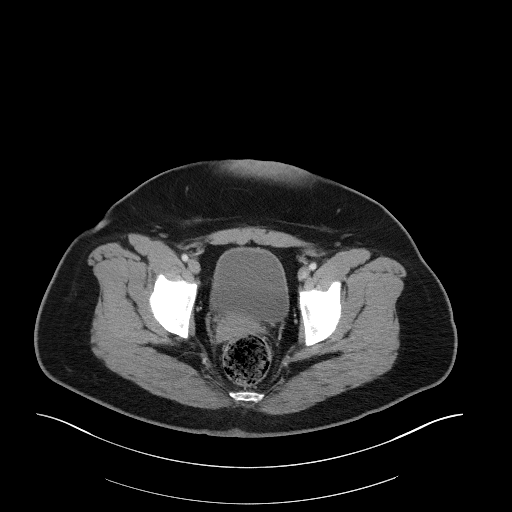
[im 31/121  mediastinal]
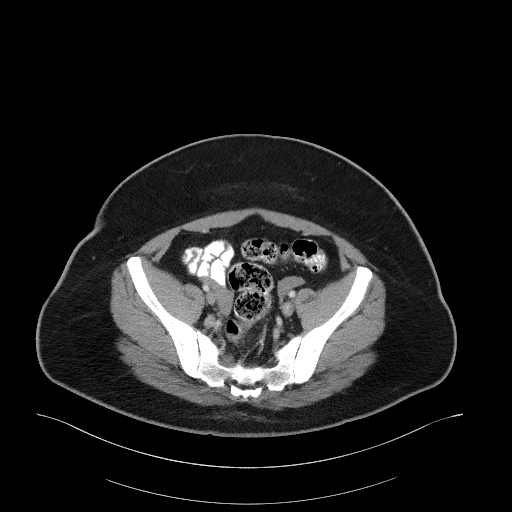
[im 41/121  mediastinal]
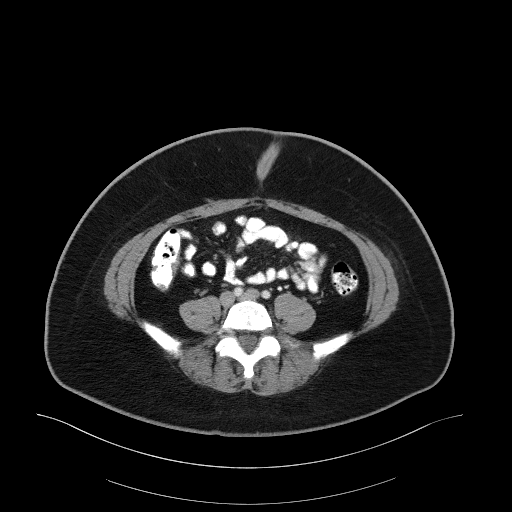
[im 51/121  mediastinal]
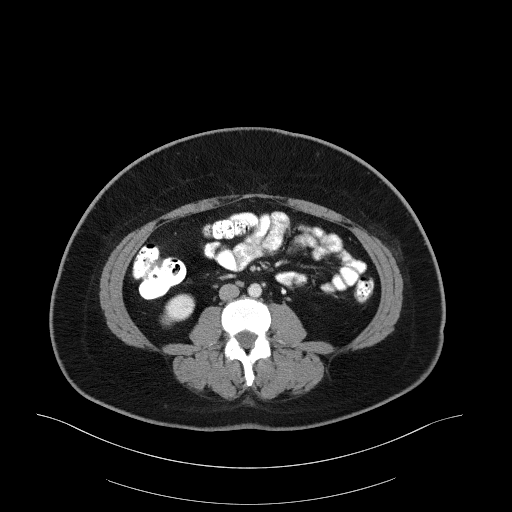
[im 61/121  mediastinal]
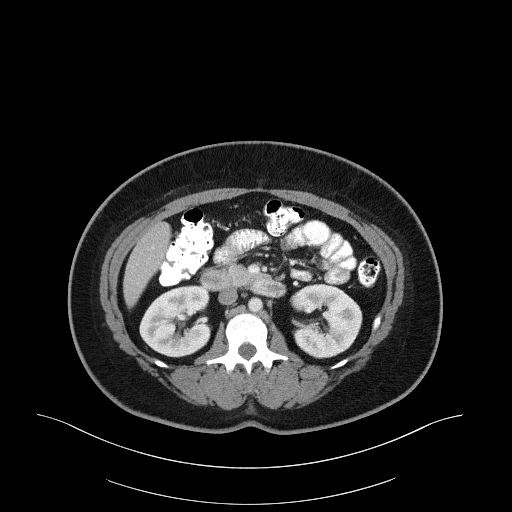
[im 71/121  mediastinal]
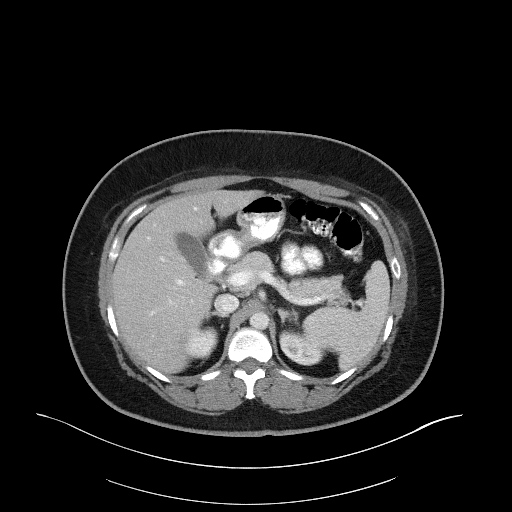
[im 81/121  mediastinal]
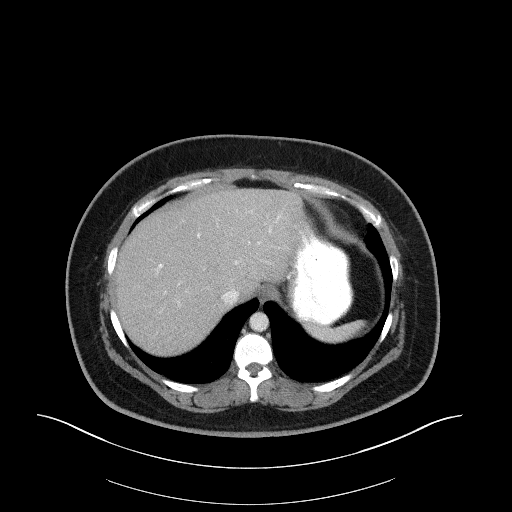
[im 91/121  mediastinal]
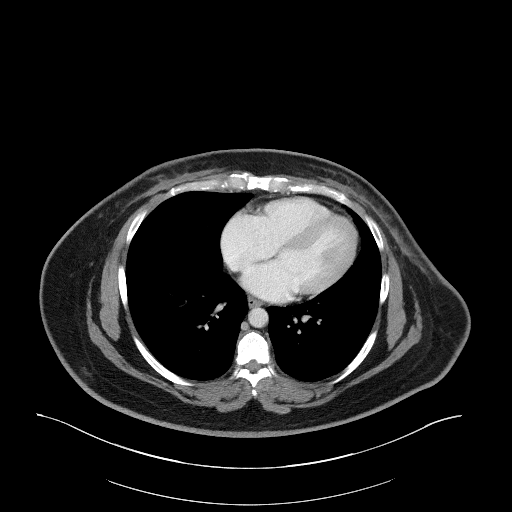
[im 91/121  bone]
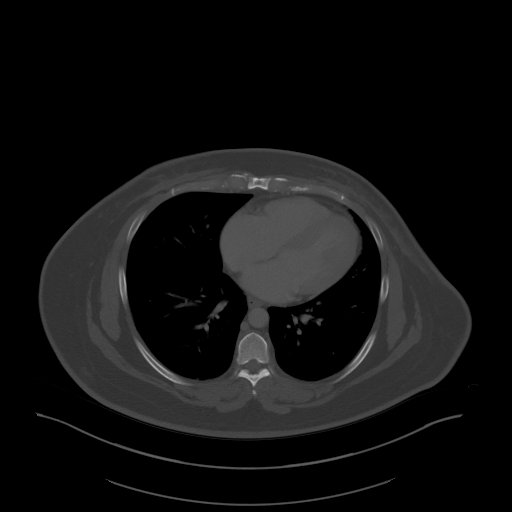
[im 101/121  mediastinal]
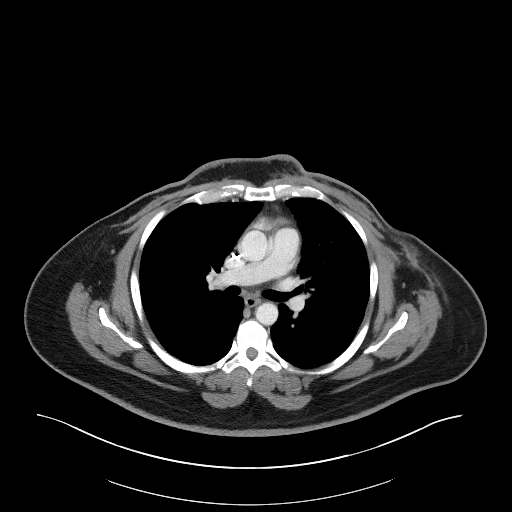
[im 111/121  mediastinal]
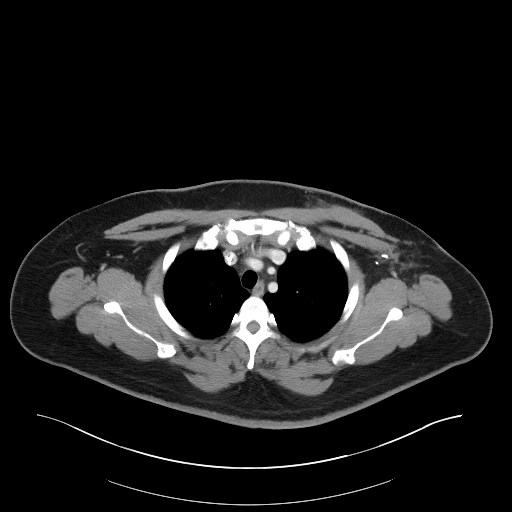

[Series 5: coronals · coronal · 0.85mm/px · 3 of 124 slices shown]
[im 25/124  mediastinal]
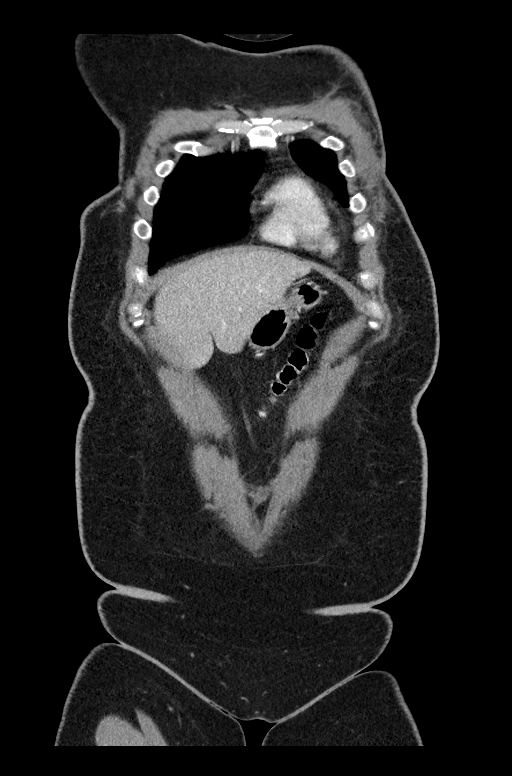
[im 50/124  mediastinal]
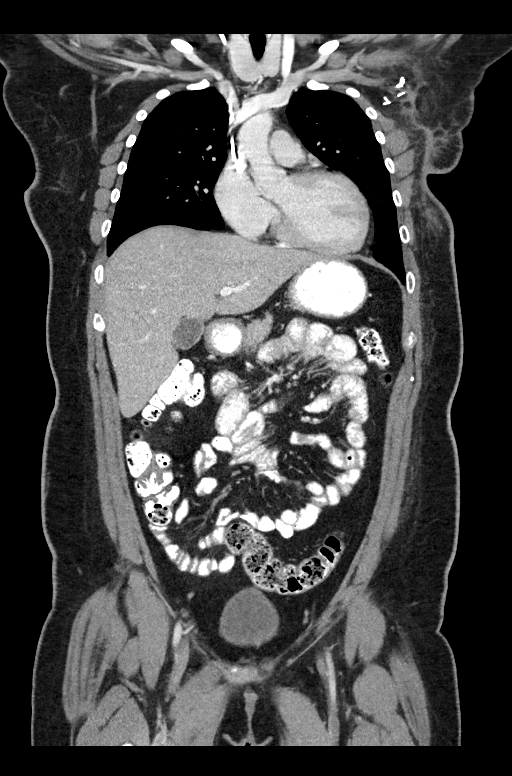
[im 74/124  mediastinal]
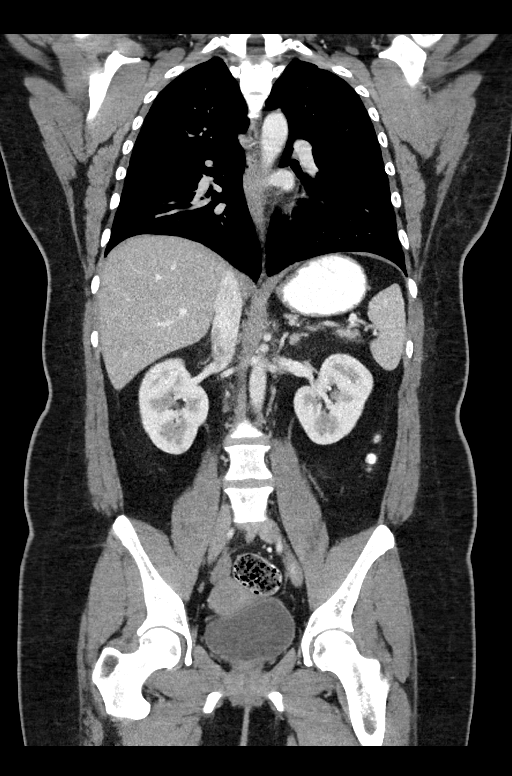

[14 of 36 positions shown; findings below may reference images not displayed]

FINDINGS: CT CHEST FINDINGS

Cardiovascular: RIGHT IJ Port-A-Cath terminates at the caval to
atrial junction. Heart size is normal without pericardial effusion.
Normal caliber of central pulmonary vessels.

Mediastinum/Nodes: LEFT axillary dissection following bilateral
mastectomy. No adenopathy. Mild stranding about the LEFT axilla. No
retropectoral or supraclavicular adenopathy. No internal mammary,
mediastinal or hilar lymphadenopathy. Esophagus grossly normal.

Lungs/Pleura: No effusion. No consolidation. No suspicious nodule.
Airways are patent.

Musculoskeletal: See below for full musculoskeletal details. Known
thoracic metastases better demonstrated on previous MR imaging in
the spine. Unchanged appearance of subtle sternal lucency (image
[DATE]) 2 mm.

Slightly more sclerotic appearance of a RIGHT lateral T11 lesion
unchanged size 5 mm.

Persistent lucency of a LEFT lateral T11 lesion with no change in
size (image 100/4) 5 mm.

CT ABDOMEN PELVIS FINDINGS

Hepatobiliary: Hepatic steatosis. No focal, suspicious hepatic
lesion. No pericholecystic stranding. No biliary duct dilation.
Portal vein is patent.

Pancreas: Normal, without mass, inflammation or ductal dilatation.

Spleen: Spleen normal size and contour.

Adrenals/Urinary Tract:

Adrenal glands are unremarkable. Symmetric renal enhancement. No
sign of hydronephrosis. No suspicious renal lesion or perinephric
stranding.

Urinary bladder is grossly unremarkable.

Stomach/Bowel: No acute gastrointestinal process. Appendix is
normal. Small bowel without dilation or adjacent stranding. Colon is
nondistended with stool mixed with ingested contrast media within
the lumen.

Vascular/Lymphatic:

Aorta with smooth contours. IVC with smooth contours. No aneurysmal
dilation of the abdominal aorta. There is no gastrohepatic or
hepatoduodenal ligament lymphadenopathy. No retroperitoneal or
mesenteric lymphadenopathy.

No pelvic sidewall lymphadenopathy.

Reproductive: Unremarkable by CT.

Other: No ascites.  No free intraperitoneal air.

Musculoskeletal: Similar appearance of thoracic spinal metastatic
lesions as visible by CT.

Lucent lesion at L2 measuring 6 mm.

Inferior endplate of L4 with a 6 mm lucent lesion and another
smaller more central lesion 2-3 mm.

L5 lucency as well compatible with metastatic process.

Lucency in the RIGHT ischium without comparison (image 113/2)
lucency in the RIGHT iliac bone (image 87/2) 1.5 cm.

Mixed sclerotic and lucent lesion in the RIGHT sacrum (image 88/2)
1.9 cm. 7 mm lucent lesion in S2 within the body of S2 (image 76/6)

LEFT iliac posteriorly with a 10 mm lucent and sclerotic area.
IMPRESSION: Post bilateral mastectomy and LEFT axillary dissection without signs
of nodal or visceral metastasis at this time.

Widespread skeletal metastatic disease with numerous small lesions
scattered throughout the spine not substantially changed in terms of
size though with some areas showing increasingly sclerotic character
as discussed.

Signs of metastatic disease to the bony pelvis not previously
characterized.

## 2021-05-16 IMAGING — NM NM BONE WHOLE BODY
2 series · 2 of 2 positions shown · non-contrast
Comparison: [DATE]

Correlation: CT chest abdomen pelvis [DATE]

CLINICAL DATA: LEFT breast cancer, restaging

EXAM:
NUCLEAR MEDICINE WHOLE BODY BONE SCAN
TECHNIQUE: Whole body anterior and posterior images were obtained approximately
3 hours after intravenous injection of radiopharmaceutical.
RADIOPHARMACEUTICALS:  20.1 mCi [RT] MDP IV

[Series 1: wbr_bone_40 whole body · 2.66mm/px · 1 of 1 slices shown (1 of 2)]
[im 1/1]
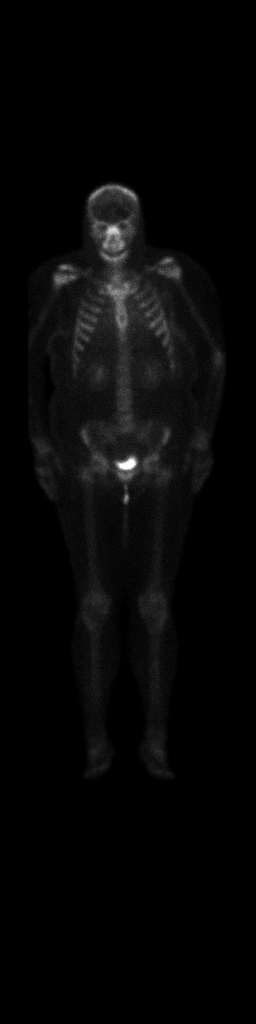

[Series 1: wbr_bone_40 whole body · 2.66mm/px · 1 of 1 slices shown (2 of 2)]
[im 1/1]
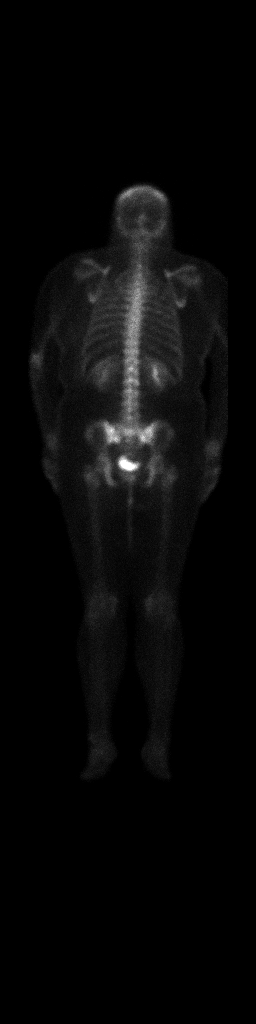

[2 of 2 positions shown; findings below may reference images not displayed]

FINDINGS: Uptake at the shoulders, sternoclavicular joints, and hips,
typically degenerative.

Decrease in degree of abnormal uptake seen previously at
thoracolumbar junction.

No new sites of abnormal osseous tracer accumulation are identified.

Expected urinary tract and soft tissue distribution of tracer.
IMPRESSION: Decreased uptake at site previously seen at the thoracolumbar
junction.

No new scintigraphic findings identified.

## 2021-05-16 MED ORDER — HEPARIN SOD (PORK) LOCK FLUSH 100 UNIT/ML IV SOLN
500.0000 [IU] | Freq: Once | INTRAVENOUS | Status: AC
  Administered 2021-05-16: 12:00:00 500 [IU] via INTRAVENOUS

## 2021-05-16 MED ORDER — HEPARIN SOD (PORK) LOCK FLUSH 100 UNIT/ML IV SOLN
INTRAVENOUS | Status: AC
  Filled 2021-05-16: qty 5

## 2021-05-16 MED ORDER — IOHEXOL 350 MG/ML SOLN
75.0000 mL | Freq: Once | INTRAVENOUS | Status: AC | PRN
  Administered 2021-05-16: 12:00:00 75 mL via INTRAVENOUS

## 2021-05-16 MED ORDER — SODIUM CHLORIDE (PF) 0.9 % IJ SOLN
INTRAMUSCULAR | Status: AC
  Filled 2021-05-16: qty 50

## 2021-05-16 MED ORDER — TECHNETIUM TC 99M MEDRONATE IV KIT
20.1000 | PACK | Freq: Once | INTRAVENOUS | Status: AC | PRN
  Administered 2021-05-16: 10:00:00 20.1 via INTRAVENOUS

## 2021-05-16 MED ORDER — TECHNETIUM TC 99M MEDRONATE IV KIT: 20.0000 | PACK | Freq: Once | INTRAVENOUS | Status: DC | PRN

## 2021-05-17 ENCOUNTER — Encounter: Payer: Self-pay | Admitting: Rehabilitation

## 2021-05-17 ENCOUNTER — Other Ambulatory Visit: Payer: Self-pay

## 2021-05-17 ENCOUNTER — Ambulatory Visit: Payer: Medicaid Other | Attending: Surgery | Admitting: Rehabilitation

## 2021-05-17 DIAGNOSIS — M25612 Stiffness of left shoulder, not elsewhere classified: Secondary | ICD-10-CM

## 2021-05-17 DIAGNOSIS — R293 Abnormal posture: Secondary | ICD-10-CM

## 2021-05-17 DIAGNOSIS — D0511 Intraductal carcinoma in situ of right breast: Secondary | ICD-10-CM

## 2021-05-17 DIAGNOSIS — C50412 Malignant neoplasm of upper-outer quadrant of left female breast: Secondary | ICD-10-CM

## 2021-05-17 DIAGNOSIS — Z170 Estrogen receptor positive status [ER+]: Secondary | ICD-10-CM | POA: Diagnosis present

## 2021-05-17 DIAGNOSIS — M25611 Stiffness of right shoulder, not elsewhere classified: Secondary | ICD-10-CM

## 2021-05-17 NOTE — Therapy (Signed)
[facility] @ [place], [place], [address] Phone: [phone]   Fax:  [phone]  Physical Therapy Treatment  Patient Details  Name: Gabriela Sullivan MRN: [id number] Date of Birth: 1991-04-12 Referring Provider (PT): Dr. Erroll Luna   Encounter Date: 05/17/2021   PT End of Session - 05/17/21 1027     Visit Number 9    Authorization - Visit Number 4    Authorization - Number of Visits 12    PT Start Time 1000    PT Stop Time 1025    PT Time Calculation (min) 25 min    Activity Tolerance Patient tolerated treatment well    Behavior During Therapy [facility] for tasks assessed/performed             Past Medical History:  Diagnosis Date   Asthma    remote   Breast cancer ([facility])    Elevated liver enzymes    Family history of breast cancer    Family history of ovarian cancer    Family history of prostate cancer    Family history of stomach cancer    Hepatic steatosis    Hyperglycemia    A1c 7.6% 12/19/20   Migraines    PCOS (polycystic ovarian syndrome)    Pre-diabetes     Past Surgical History:  Procedure Laterality Date   MASTECTOMY W/ SENTINEL NODE BIOPSY Right 01/31/2021   Procedure: RIGHT SIMPLE MASTECTOMY;  Surgeon: Erroll Luna, MD;  Location: [facility];  Service: General;  Laterality: Right;   MODIFIED MASTECTOMY Left 01/31/2021   Procedure: LEFT MODIFIED RADICAL MASTECTOMY;  Surgeon: Erroll Luna, MD;  Location: [facility];  Service: General;  Laterality: Left;   NO PAST SURGERIES     PORTACATH PLACEMENT Right 10/18/2020   Procedure: INSERTION PORT-A-CATH;  Surgeon: Erroll Luna, MD;  Location: [facility];  Service: General;  Laterality: Right;    There were no vitals filed for this visit.   Subjective Assessment - 05/17/21 1000     Subjective Not completely healed.  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 It is ER/PR positive and HER2 negative with a Ki67 of 15%. She underwent neoadjuvant chemotherapy beginning 10/24/2020 for 2 rounds which was stopped due to elevated LFTs. She had a bilateral mastectomy with left axillary node dissection on 01/31/2021 with 15/15 nodes positive. She started Anastrozole on 12/06/2020. 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    Currently in Pain? No/denies                [facility] PT Assessment - 05/17/21 0001       AROM   Left Shoulder Flexion 160 Degrees    Left Shoulder ABduction 165 Degrees    Left Shoulder External Rotation 90 Degrees                L-DEX FLOWSHEETS - 05/17/21 1000       L-DEX LYMPHEDEMA SCREENING   Measurement Type Bilateral    L-DEX MEASUREMENT EXTREMITY Upper Extremity    POSITION  Standing    DOMINANT SIDE Right    BASELINE RIGHT 0.6    BASELINE LEFT 0.1    RIGHT SIDE SCORE 2.1    LEFT SIDE SCORE 1.1    VALUE CHANGE RIGHT 1.5    VALUE CHANGE LEFT 1  Gabriela Sullivan - 05/17/21 0001     Open a tight or new jar No difficulty    Do heavy household chores (wash walls, wash floors) Mild difficulty    Carry a shopping bag or briefcase No difficulty    Wash your back No difficulty    Use a knife to cut food No difficulty    Recreational activities in which you take some force or impact through your arm, shoulder, or hand (golf, hammering, tennis) Mild difficulty    During the past week, to what extent has your arm, shoulder or hand problem interfered with your normal social activities with family, friends, neighbors, or groups? Not at all    During the past week, to what extent has your arm, shoulder or hand problem limited your work or other regular daily activities Not at all    Arm, shoulder, or hand pain. None    Tingling (pins and needles) in your arm, shoulder, or hand None    Difficulty Sleeping Mild  difficulty    DASH Score 6.82 %                    [facility] Adult PT Treatment/Exercise - 05/17/21 0001       Self-Care   Other Self-Care Comments  After checking in with good AROM, skin healing, and SOZO educated pt about the importance of stretching from now on due to radiation. Pt is already ind with a good stretching program.                          PT Long Term Goals - 05/17/21 1021       PT LONG TERM GOAL #1   Title Patient will be independent in her HEP for improving shoulder ROM and function.    Status Achieved      PT LONG TERM GOAL #2   Title Patient will increase bilateral shoulder flexion AROM to >/= 146 degrees for increased ease reaching.    Status Achieved      PT LONG TERM GOAL #3   Title Patient will increase bilateral shoulder abduction AROM to >/= 150 degrees for increased ease reaching.    Status Achieved      PT LONG TERM GOAL #4   Title Patient will improve her DASH score for increased overall upper extremity function to </= 11.36      PT LONG TERM GOAL #5   Title Patient will verbalize good understanding of lymphedema risk reduction practices.                   Plan - 05/17/21 1028     Clinical Impression Statement Pt is healing well now with one spot that is having trouble fully healing if pt does too much. After checking in with good AROM, skin healing, and SOZO score we decided to cancel the rest of the visits and switch over to HEP with SOZO every 3 months.  educated pt about the importance of stretching from now on due to radiation. Pt is already ind with a good stretching program.    Stability/Clinical Decision Making Stable/Uncomplicated    PT Next Visit Plan Do SOZO 1month    Consulted and Agree with Plan of Care Patient             Patient will benefit from skilled therapeutic intervention in order to improve the following deficits and impairments:  Postural dysfunction, Decreased range of motion,  Decreased  knowledge of precautions, Impaired UE functional use, Pain, Increased fascial restricitons, Decreased strength, Increased edema, Decreased scar mobility, Decreased skin integrity  Visit Diagnosis: Ductal carcinoma in situ (DCIS) of right breast  Malignant neoplasm of upper-outer quadrant of left breast in female, estrogen receptor positive (HCC)  Abnormal posture  Stiffness of left shoulder, not elsewhere classified  Stiffness of right shoulder, not elsewhere classified     Problem List Patient Active Problem List   Diagnosis Date Noted   Left breast cancer with T3 tumor, >5 cm in greatest dimension ([place]) 01/31/2021   Bone metastases ([place]) 12/28/2020   Port-A-Cath in place 12/06/2020   Genetic testing 11/25/2020   Goals of care, counseling/discussion 10/25/2020   Lytic bone lesions on xray 10/16/2020   Family history of ovarian cancer    Family history of prostate cancer    Family history of breast cancer    Family history of stomach cancer    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   Malignant neoplasm of upper-outer quadrant of left breast in female, estrogen receptor positive ([place]) 09/29/2020   Elevated LFTs     Stark Bray, PT 05/17/2021, 10:29 AM  [facility] @ [place], [place], [address] Phone: [phone]   Fax:  [phone]  Name: Gabriela Sullivan MRN: [id number] Date of Birth: 1990-07-20

## 2021-05-18 ENCOUNTER — Other Ambulatory Visit: Payer: Self-pay

## 2021-05-18 ENCOUNTER — Telehealth: Payer: Self-pay | Admitting: Hematology and Oncology

## 2021-05-18 NOTE — Telephone Encounter (Signed)
I informed her that the CT scan and bone scan shows stable disease.

## 2021-05-24 ENCOUNTER — Encounter: Payer: Medicaid Other | Admitting: Rehabilitation

## 2021-05-25 ENCOUNTER — Other Ambulatory Visit: Payer: Self-pay | Admitting: Hematology and Oncology

## 2021-05-25 ENCOUNTER — Other Ambulatory Visit (HOSPITAL_COMMUNITY): Payer: Self-pay

## 2021-05-25 DIAGNOSIS — C50412 Malignant neoplasm of upper-outer quadrant of left female breast: Secondary | ICD-10-CM

## 2021-05-25 MED ORDER — ABEMACICLIB 100 MG PO TABS
100.0000 mg | ORAL_TABLET | Freq: Two times a day (BID) | ORAL | 0 refills | Status: DC
  Filled 2021-05-25: qty 56, 28d supply, fill #0

## 2021-05-29 ENCOUNTER — Other Ambulatory Visit (HOSPITAL_COMMUNITY): Payer: Self-pay

## 2021-05-29 ENCOUNTER — Other Ambulatory Visit: Payer: Self-pay

## 2021-05-29 ENCOUNTER — Inpatient Hospital Stay: Payer: Medicaid Other

## 2021-05-29 ENCOUNTER — Inpatient Hospital Stay: Payer: Medicaid Other | Admitting: Pharmacist

## 2021-05-29 ENCOUNTER — Inpatient Hospital Stay: Payer: Medicaid Other | Attending: Oncology

## 2021-05-29 DIAGNOSIS — C50412 Malignant neoplasm of upper-outer quadrant of left female breast: Secondary | ICD-10-CM | POA: Insufficient documentation

## 2021-05-29 DIAGNOSIS — C7951 Secondary malignant neoplasm of bone: Secondary | ICD-10-CM | POA: Diagnosis present

## 2021-05-29 DIAGNOSIS — Z170 Estrogen receptor positive status [ER+]: Secondary | ICD-10-CM | POA: Diagnosis not present

## 2021-05-29 DIAGNOSIS — Z95828 Presence of other vascular implants and grafts: Secondary | ICD-10-CM

## 2021-05-29 LAB — CBC WITH DIFFERENTIAL/PLATELET
Abs Immature Granulocytes: 0 10*3/uL (ref 0.00–0.07)
Basophils Absolute: 0 10*3/uL (ref 0.0–0.1)
Basophils Relative: 1 %
Eosinophils Absolute: 0.1 10*3/uL (ref 0.0–0.5)
Eosinophils Relative: 2 %
HCT: 40 % (ref 36.0–46.0)
Hemoglobin: 13.8 g/dL (ref 12.0–15.0)
Immature Granulocytes: 0 %
Lymphocytes Relative: 20 %
Lymphs Abs: 0.5 10*3/uL — ABNORMAL LOW (ref 0.7–4.0)
MCH: 28 pg (ref 26.0–34.0)
MCHC: 34.5 g/dL (ref 30.0–36.0)
MCV: 81.3 fL (ref 80.0–100.0)
Monocytes Absolute: 0.1 10*3/uL (ref 0.1–1.0)
Monocytes Relative: 4 %
Neutro Abs: 1.7 10*3/uL (ref 1.7–7.7)
Neutrophils Relative %: 73 %
Platelets: 206 10*3/uL (ref 150–400)
RBC: 4.92 MIL/uL (ref 3.87–5.11)
RDW: 13 % (ref 11.5–15.5)
WBC: 2.3 10*3/uL — ABNORMAL LOW (ref 4.0–10.5)
nRBC: 0 % (ref 0.0–0.2)

## 2021-05-29 LAB — COMPREHENSIVE METABOLIC PANEL
ALT: 27 U/L (ref 0–44)
AST: 21 U/L (ref 15–41)
Albumin: 4.5 g/dL (ref 3.5–5.0)
Alkaline Phosphatase: 54 U/L (ref 38–126)
Anion gap: 7 (ref 5–15)
BUN: 14 mg/dL (ref 6–20)
CO2: 26 mmol/L (ref 22–32)
Calcium: 9.5 mg/dL (ref 8.9–10.3)
Chloride: 108 mmol/L (ref 98–111)
Creatinine, Ser: 0.59 mg/dL (ref 0.44–1.00)
GFR, Estimated: >60 mL/min (ref >60)
Glucose, Bld: 92 mg/dL (ref 70–99)
Potassium: 3.8 mmol/L (ref 3.5–5.1)
Sodium: 141 mmol/L (ref 135–145)
Total Bilirubin: 0.6 mg/dL (ref 0.3–1.2)
Total Protein: 7.4 g/dL (ref 6.5–8.1)

## 2021-05-29 MED ORDER — ABEMACICLIB 150 MG PO TABS
150.0000 mg | ORAL_TABLET | Freq: Two times a day (BID) | ORAL | 0 refills | Status: DC
  Filled 2021-05-29: qty 56, 28d supply, fill #0

## 2021-05-29 MED ORDER — GOSERELIN ACETATE 3.6 MG ~~LOC~~ IMPL
3.6000 mg | DRUG_IMPLANT | Freq: Once | SUBCUTANEOUS | Status: AC
  Administered 2021-05-29: 11:00:00 3.6 mg via SUBCUTANEOUS
  Filled 2021-05-29: qty 3.6

## 2021-05-29 NOTE — Patient Instructions (Signed)
Goserelin injection °What is this medication? °GOSERELIN (GOE se rel in) is similar to a hormone found in the body. It lowers the amount of sex hormones that the body makes. Men will have lower testosterone levels and women will have lower estrogen levels while taking this medicine. In men, this medicine is used to treat prostate cancer; the injection is either given once per month or once every 12 weeks. A once per month injection (only) is used to treat women with endometriosis, dysfunctional uterine bleeding, or advanced breast cancer. °This medicine may be used for other purposes; ask your health care provider or pharmacist if you have questions. °COMMON BRAND NAME(S): Zoladex, Zoladex 3-Month °What should I tell my care team before I take this medication? °They need to know if you have any of these conditions: °bone problems °diabetes °heart disease °history of irregular heartbeat °an unusual or allergic reaction to goserelin, other medicines, foods, dyes, or preservatives °pregnant or trying to get pregnant °breast-feeding °How should I use this medication? °This medicine is for injection under the skin. It is given by a health care professional in a hospital or clinic setting. °Talk to your pediatrician regarding the use of this medicine in children. Special care may be needed. °Overdosage: If you think you have taken too much of this medicine contact a poison control center or emergency room at once. °NOTE: This medicine is only for you. Do not share this medicine with others. °What if I miss a dose? °It is important not to miss your dose. Call your doctor or health care professional if you are unable to keep an appointment. °What may interact with this medication? °Do not take this medicine with any of the following medications: °cisapride °dronedarone °pimozide °thioridazine °This medicine may also interact with the following medications: °other medicines that prolong the QT interval (an abnormal heart  rhythm) °This list may not describe all possible interactions. Give your health care provider a list of all the medicines, herbs, non-prescription drugs, or dietary supplements you use. Also tell them if you smoke, drink alcohol, or use illegal drugs. Some items may interact with your medicine. °What should I watch for while using this medication? °Visit your doctor or health care provider for regular checks on your progress. Your symptoms may appear to get worse during the first weeks of this therapy. Tell your doctor or healthcare provider if your symptoms do not start to get better or if they get worse after this time. °Your bones may get weaker if you take this medicine for a long time. If you smoke or frequently drink alcohol you may increase your risk of bone loss. A family history of osteoporosis, chronic use of drugs for seizures (convulsions), or corticosteroids can also increase your risk of bone loss. Talk to your doctor about how to keep your bones strong. °This medicine should stop regular monthly menstruation in women. Tell your doctor if you continue to menstruate. °Women should not become pregnant while taking this medicine or for 12 weeks after stopping this medicine. Women should inform their doctor if they wish to become pregnant or think they might be pregnant. There is a potential for serious side effects to an unborn child. Talk to your health care professional or pharmacist for more information. Do not breast-feed an infant while taking this medicine. °Men should inform their doctors if they wish to father a child. This medicine may lower sperm counts. Talk to your health care professional or pharmacist for more information. °This   medicine may increase blood sugar. Ask your healthcare provider if changes in diet or medicines are needed if you have diabetes. °What side effects may I notice from receiving this medication? °Side effects that you should report to your doctor or health care  professional as soon as possible: °allergic reactions like skin rash, itching or hives, swelling of the face, lips, or tongue °bone pain °breathing problems °changes in vision °chest pain °feeling faint or lightheaded, falls °fever, chills °pain, swelling, warmth in the leg °pain, tingling, numbness in the hands or feet °signs and symptoms of high blood sugar such as being more thirsty or hungry or having to urinate more than normal. You may also feel very tired or have blurry vision °signs and symptoms of low blood pressure like dizziness; feeling faint or lightheaded, falls; unusually weak or tired °stomach pain °swelling of the ankles, feet, hands °trouble passing urine or change in the amount of urine °unusually high or low blood pressure °unusually weak or tired °Side effects that usually do not require medical attention (report to your doctor or health care professional if they continue or are bothersome): °change in sex drive or performance °changes in breast size in both males and females °changes in emotions or moods °headache °hot flashes °irritation at site where injected °loss of appetite °skin problems like acne, dry skin °vaginal dryness °This list may not describe all possible side effects. Call your doctor for medical advice about side effects. You may report side effects to FDA at 1-800-FDA-1088. °Where should I keep my medication? °This drug is given in a hospital or clinic and will not be stored at home. °NOTE: This sheet is a summary. It may not cover all possible information. If you have questions about this medicine, talk to your doctor, pharmacist, or health care provider. °© 2022 [employer] (2018-09-18 00:00:00) ° °

## 2021-05-29 NOTE — Progress Notes (Signed)
[place]       Telephone: [phone]?Fax: [phone]   Oncology Clinical Pharmacist Practitioner Initial Assessment  Gabriela Sullivan is a 30 y.o. female with a diagnosis of metastatic breast cancer. They were contacted today via in-person visit.  Indication/Regimen Abemaciclib (Verzenio) is being used appropriately for treatment of metastatic breast cancer by Dr. Nicholas Lose.    Wt Readings from Last 1 Encounters:  05/29/21 179 lb 14.4 oz (81.6 kg)    Estimated body surface area is 1.86 meters squared as calculated from the following:   Height as of this encounter: 5' (1.524 m).   Weight as of this encounter: 179 lb 14.4 oz (81.6 kg).  The dosing regimen is 100 mg by mouth every 12 hours on days 1 to 28 of a 28-day cycle. This is being given  in combination with anastrozole, goserelin, and every 3 month zoledronic acid. It is planned to continue until disease progression or unacceptable toxicity.  Gabriela Sullivan is doing well.  She was last seen by Dr. Lindi Adie on November 23, she also spoke to [facility] our oral chemotherapy pharmacist on that date.  She is currently on abemaciclib 100 mg every 12 hours that she started on December 5.  She last received zoledronic acid on November 23 which is every 3 months.  And she receives goserelin every 28 days which she will receive today.  Dr. Lindi Adie has asked clinical pharmacy to establish care with the patient and we are seeing her today in clinic.  Gabriela Sullivan describes some loss of appetite, some loose stools 1 or 2 a week, and periodic nausea.  She does report one episode of vomiting, but believes it was due to food poisoning.  This occurred during the second week of therapy.  She currently gets her abemaciclib from [facility] [employer].  She is interested in increasing the abemaciclib 150 mg every 12 hours.  A new prescription will be sent for this dose today.  She has not taken any antinausea medications as of this date.  But she does have  Zofran at home.  We went over today how to take this medication.  She also has loperamide at home.  She will take this as needed and we went over the instructions for taking this as well.  We also reviewed how to take abemaciclib which should be taken every 12 hours she plans on taking this at 9 AM and at 9 PM.  We discussed that abemaciclib can be taken with or without food, currently she is taking it without food at times and with food at times.  We told her the importance of being consistent and that some patients report taking it with food may minimize GI upset.  She currently lives with her mom and dad.  She has a brother and sister that live in [place].  Her parents came to [place] for work.  Gabriela Sullivan feels that she has a good support system at home.  She has a liver condition known as NASH and is currently being followed per her report by Dr. [place] Cellar for this condition.  Her LFTs are WNL today after being slightly elevated at previous lab appointments.  We discussed common side effects of abemaciclib which include but are not limited to diarrhea, neutropenia, nausea, and fatigue.  We also discussed more serious side effects that are less common including interstitial lung disease and VTE.  We discussed to avoid grapefruit products, storage and handling regarding abemaciclib,  and what to do if a dose is missed.  She will see clinical pharmacy again with labs in 2 weeks and in 4 weeks.  She will have restaging scans sometime in mid March and see Dr. Lindi Adie shortly thereafter to review those scans.  Labs were reviewed today with patient, and per manufacturing guidelines patient is okay to continue treatment with abemaciclib at this time.   Dose Modifications 100 mg PO BID initially, will increase to 150 mg PO BID starting with next cycle (06/04/21)  Access Assessment Gabriela Sullivan will be receiving abemaciclib through [facility] Concerns: no Start date if  known: 05/07/21  Allergies No Known Allergies  Vitals Vitals with BMI 05/29/2021 05/07/2021 04/25/2021  Height 5\' 0"  5\' 0"  5\' 0"   Weight 179 lbs 14 oz 184 lbs 185 lbs 6 oz  BMI 35.13 44.01 02.72  Systolic 536 644 034  Diastolic 72 80 88  Pulse 77 81 79     Laboratory Data CBC EXTENDED Latest Ref Rng & Units 05/29/2021 04/25/2021 04/04/2021  WBC 4.0 - 10.5 K/uL 2.3(L) 4.3 4.0  RBC 3.87 - 5.11 MIL/uL 4.92 4.99 4.83  HGB 12.0 - 15.0 g/dL 13.8 14.0 13.7  HCT 36.0 - 46.0 % 40.0 40.7 38.6  PLT 150 - 400 K/uL 206 311 280  NEUTROABS 1.7 - 7.7 K/uL 1.7 2.8 2.5  LYMPHSABS 0.7 - 4.0 K/uL 0.5(L) 0.8 1.0    CMP Latest Ref Rng & Units 05/29/2021 04/25/2021 04/04/2021  Glucose 70 - 99 mg/dL 92 123(H) 117(H)  BUN 6 - 20 mg/dL 14 9 9   Creatinine 0.44 - 1.00 mg/dL 0.59 0.57 0.57  Sodium 135 - 145 mmol/L 141 142 144  Potassium 3.5 - 5.1 mmol/L 3.8 3.6 3.4(L)  Chloride 98 - 111 mmol/L 108 111 109  CO2 22 - 32 mmol/L 26 24 24   Calcium 8.9 - 10.3 mg/dL 9.5 9.0 9.0  Total Protein 6.5 - 8.1 g/dL 7.4 7.3 7.2  Total Bilirubin 0.3 - 1.2 mg/dL 0.6 0.3 0.8  Alkaline Phos 38 - 126 U/L 54 53 49  AST 15 - 41 U/L 21 41 58(H)  ALT 0 - 44 U/L 27 85(H) 97(H)   No results found for: MG   Contraindications Contraindications were reviewed? Yes Contraindications to therapy were identified? No   Safety Precautions The following safety precautions for the use of abemaciclib were reviewed:  Diarrhea: we reviewed that diarrhea is common with abemaciclib and confirmed that she does have loperamide (Imodium) at home.  We reviewed how to take this medication PRN and gave her information on abemaciclib Neutropenia: we discussed the importance of having a thermometer and what the Centers for Disease Control and Prevention (CDC) considers a fever which is 100.70F (38C) or higher.  Gave patient 24/7 triage line to call if any fevers or symptoms ILD/Pneumonitis: we reviewed potential symptoms including cough, shortness,  and fatigue. Hepatotoxicity: reviewed to contact clinic for RUQ pain that will not subside, yellowing of eyes/skin. She has NASH so will closely be watching her LFTs Venous thromboembolism (VTE): reviewed signs of deep vein thrombosis (DVT) such as leg swelling, redness, pain, or tenderness and signs of pulmonary embolism (PE) such as shortness of breath, rapid or irregular heartbeat, cough, chest pain, or lightheadedness Reviewed to take the medication every 12 hours (with food sometimes can be easier on the stomach) and to take it at the same time every day. Discussed proper storage and handling of abemaciclib Reviewed that proper contraception should be  used should she engage in sexual intercourse due to potential risk to fetus if she would become pregnant  Medication Reconciliation Current Outpatient Medications  Medication Sig Dispense Refill   abemaciclib (VERZENIO) 100 MG tablet Take 1 tablet (100 mg total) by mouth 2 (two) times daily. Swallow tablets whole. Do not chew, crush, or split tablets before swallowing. 56 tablet 0   anastrozole (ARIMIDEX) 1 MG tablet Take 1 tablet (1 mg total) by mouth daily. 90 tablet 3   calcium carbonate (TUMS - DOSED IN MG ELEMENTAL CALCIUM) 500 MG chewable tablet Chew 2 tablets by mouth every other day.     cholecalciferol (VITAMIN D3) 25 MCG (1000 UNIT) tablet Take 1,000 Units by mouth daily.     Goserelin Acetate (ZOLADEX [place]) Inject 1 Dose into the skin every 30 (thirty) days.     loperamide (IMODIUM) 2 MG capsule Take by mouth as needed for diarrhea or loose stools.     metFORMIN (GLUCOPHAGE-XR) 500 MG 24 hr tablet Take 1,000 mg by mouth every evening.     ondansetron (ZOFRAN) 8 MG tablet Take by mouth every 8 (eight) hours as needed for nausea or vomiting.     Vitamin D, Ergocalciferol, (DRISDOL) 50000 UNITS CAPS capsule Take 50,000 Units by mouth every Monday.     venlafaxine XR (EFFEXOR-XR) 37.5 MG 24 hr capsule Take 1 capsule (37.5 mg total) by mouth  daily with breakfast. (Patient not taking: Reported on 05/29/2021) 90 capsule 4   No current facility-administered medications for this visit.   Medication reconciliation is based on the patient's most recent medication list in the electronic medical record (EMR) including herbal products and OTC medications.   The patient's medication list was reviewed today with the patient? Yes   Drug-drug interactions (DDIs) DDIs were evaluated? Yes DDIs identified? No   Drug-Food Interactions Drug-food interactions were evaluated? Yes Drug-food interactions identified? Yes , was advised to avoid grapefruit products  Follow-up Plan  Will increase abemaciclib to 150 mg every 12 hours starting with cycle 2 on 06/04/21. Prescription sent Labs and visit with pharmacy clinic in 2 weeks (06/12/21) Labs, visit with pharmacy clinic, goserelin in 4 weeks (06/26/21) Labs, visit with pharmacy clinic, goserelin, zoledronic acid in 8 weeks (07/24/21) Restaging scans 08/14/21 Labs, visit with Dr. Lindi Adie, goserelin in 12 weeks (08/21/21)  Gabriela Sullivan participated in the discussion, expressed understanding, and voiced agreement with the above plan. All questions were answered to her satisfaction. The patient was advised to contact the clinic at [phone] with any questions or concerns prior to her return visit.   I spent 30 minutes assessing the patient.  Raina Mina, RPH-CPP, 05/29/2021 10:18 AM

## 2021-05-30 ENCOUNTER — Telehealth: Payer: Self-pay | Admitting: Pharmacist

## 2021-05-30 ENCOUNTER — Ambulatory Visit: Payer: Medicaid Other

## 2021-05-30 NOTE — Telephone Encounter (Signed)
Scheduled appointment per 12/27 los. Patient is aware. 

## 2021-05-31 ENCOUNTER — Other Ambulatory Visit (HOSPITAL_COMMUNITY): Payer: Self-pay

## 2021-06-11 ENCOUNTER — Ambulatory Visit
Admission: RE | Admit: 2021-06-11 | Discharge: 2021-06-11 | Disposition: A | Discharge status: Home / Self Care | Payer: Medicaid Other | Source: Ambulatory Visit | Attending: Radiation Oncology | Admitting: Radiation Oncology

## 2021-06-11 DIAGNOSIS — C50412 Malignant neoplasm of upper-outer quadrant of left female breast: Secondary | ICD-10-CM

## 2021-06-11 DIAGNOSIS — Z170 Estrogen receptor positive status [ER+]: Secondary | ICD-10-CM

## 2021-06-11 NOTE — Progress Notes (Signed)
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  Intent: Curative  Radiation Treatment Dates: 03/14/2021 through 05/04/2021 Site Technique Total Dose (Gy) Dose per Fx (Gy) Completed Fx Beam Energies  Chest Wall, Left: CW_Lt 3D 50.4/50.4 1.8 28/28 10X  Chest Wall, Left: CW_Lt_SCLV 3D 50.4/50.4 1.8 28/28 6X, 10X  Chest Wall, Left: CW_Lt_Bst specialPort 10/10 2 5/5 6E   Narrative: The patient tolerated radiation therapy relatively well. She developed   anticipated skin changes in the treatment field but her skin was intact.  Impression/Plan: 1. 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 I was unable to reach the patient but left a voicemail and on the message, I  discussed that we would be happy to continue to follow her as needed, but she will also continue to follow up with Dr. Lindi Adie in medical oncology. She was counseled to call if she had questions about her treatment.      Carola Rhine, PAC

## 2021-06-12 ENCOUNTER — Inpatient Hospital Stay: Payer: Medicaid Other | Admitting: Pharmacist

## 2021-06-12 ENCOUNTER — Other Ambulatory Visit: Payer: Self-pay

## 2021-06-12 ENCOUNTER — Inpatient Hospital Stay: Payer: Medicaid Other | Attending: Oncology

## 2021-06-12 VITALS — BP 151/83 | HR 70 | Temp 97.4°F | Resp 18 | Ht 60.0 in | Wt 178.3 lb

## 2021-06-12 DIAGNOSIS — C50412 Malignant neoplasm of upper-outer quadrant of left female breast: Secondary | ICD-10-CM | POA: Insufficient documentation

## 2021-06-12 DIAGNOSIS — Z170 Estrogen receptor positive status [ER+]: Secondary | ICD-10-CM | POA: Diagnosis not present

## 2021-06-12 DIAGNOSIS — C7951 Secondary malignant neoplasm of bone: Secondary | ICD-10-CM | POA: Diagnosis present

## 2021-06-12 LAB — COMPREHENSIVE METABOLIC PANEL
ALT: 45 U/L — ABNORMAL HIGH (ref 0–44)
AST: 29 U/L (ref 15–41)
Albumin: 4.4 g/dL (ref 3.5–5.0)
Alkaline Phosphatase: 47 U/L (ref 38–126)
Anion gap: 8 (ref 5–15)
BUN: 14 mg/dL (ref 6–20)
CO2: 26 mmol/L (ref 22–32)
Calcium: 9.4 mg/dL (ref 8.9–10.3)
Chloride: 108 mmol/L (ref 98–111)
Creatinine, Ser: 0.64 mg/dL (ref 0.44–1.00)
GFR, Estimated: >60 mL/min (ref >60)
Glucose, Bld: 96 mg/dL (ref 70–99)
Potassium: 3.3 mmol/L — ABNORMAL LOW (ref 3.5–5.1)
Sodium: 142 mmol/L (ref 135–145)
Total Bilirubin: 0.6 mg/dL (ref 0.3–1.2)
Total Protein: 7.3 g/dL (ref 6.5–8.1)

## 2021-06-12 LAB — CBC WITH DIFFERENTIAL/PLATELET
Abs Immature Granulocytes: 0 10*3/uL (ref 0.00–0.07)
Basophils Absolute: 0 10*3/uL (ref 0.0–0.1)
Basophils Relative: 1 %
Eosinophils Absolute: 0 10*3/uL (ref 0.0–0.5)
Eosinophils Relative: 1 %
HCT: 35.9 % — ABNORMAL LOW (ref 36.0–46.0)
Hemoglobin: 12.5 g/dL (ref 12.0–15.0)
Immature Granulocytes: 0 %
Lymphocytes Relative: 26 %
Lymphs Abs: 0.8 10*3/uL (ref 0.7–4.0)
MCH: 28.6 pg (ref 26.0–34.0)
MCHC: 34.8 g/dL (ref 30.0–36.0)
MCV: 82.2 fL (ref 80.0–100.0)
Monocytes Absolute: 0.1 10*3/uL (ref 0.1–1.0)
Monocytes Relative: 5 %
Neutro Abs: 1.9 10*3/uL (ref 1.7–7.7)
Neutrophils Relative %: 67 %
Platelets: 290 10*3/uL (ref 150–400)
RBC: 4.37 MIL/uL (ref 3.87–5.11)
RDW: 14.5 % (ref 11.5–15.5)
WBC: 2.9 10*3/uL — ABNORMAL LOW (ref 4.0–10.5)
nRBC: 0 % (ref 0.0–0.2)

## 2021-06-12 NOTE — Progress Notes (Signed)
[place] [place]       Telephone: [phone]?Fax: [phone]   Oncology Clinical Pharmacist Practitioner Progress Note  Gabriela Sullivan was contacted via in-person to discuss her chemotherapy regimen for abemaciclib which they receive under the care of Dr. Nicholas Sullivan.  Current treatment regimen and start date Abemaciclib (05/07/21) Goserelin (10/11/20) Anastrozole (04/25/21) Zoledronic acid (11/01/20)  Interval History She continues on abemaciclib 150 mg by mouth  every 12 hours on days 1 to 28 of a 28-day cycle. This is being given  in combination with anastrozole, goserelin, and zoledronic acid . Therapy is planned to continue until disease progression or unacceptable toxicity.   Response to Therapy Gabriela Sullivan is doing well.  She last saw clinical pharmacy on December 27.  She was seen today for toxicity assessment and labs.  She last saw Dr. Lindi Sullivan on November 23.  She is tolerating the abemaciclib 150 mg every 12 hours very well.  This dose was started on 06/04/21. She states that she has some loose stools but it is manageable, and she has not needed to use any loperamide.  She reports no other side effects at this time.  She does state that she started seeing a new primary care physician named Dr. Hart Carwin Sullivan.  She reports that he works for [employer].  He has stopped the metformin, and has started her on Ozempic.  This has been updated on her medication list.  She will again see clinical pharmacy with labs and goserelin on January 24 and then have another visit with clinical pharmacy on February 21 with labs and goserelin and zoledronic acid.  These appointments have been updated with our scheduling department.  Restaging scans have been ordered for mid March and she has a follow-up appointment with Dr. Lindi Sullivan on March 21.  During her visit her complete metabolic panel had not resulted yet, but explained to Gabriela Sullivan that if any values were abnormal that would  prevent her from continuing treatment with abemaciclib we would contact her.  Her CMP has now resulted and is within treatment parameters to continue treatment with abemaciclib.  Potassium slightly low and ALT slightly elevated. 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 Her CBC had resulted during her visit and all values appear stable. Labs, vitals, treatment parameters, and manufacturer guidelines assessing toxicity were reviewed with Gabriela Sullivan today. Based on these values, patient is in agreement to continue therapy at this time.  Allergies No Known Allergies  Vitals Vitals with BMI 06/12/2021 05/29/2021 05/07/2021  Height 5\' 0"  5\' 0"  5\' 0"   Weight 178 lbs 5 oz 179 lbs 14 oz 184 lbs  BMI 34.82 58.09 98.33  Systolic 825 053 976  Diastolic 83 72 80  Pulse 70 77 81     Laboratory Data CBC EXTENDED Latest Ref Rng & Units 06/12/2021 05/29/2021 04/25/2021  WBC 4.0 - 10.5 K/uL 2.9(L) 2.3(L) 4.3  RBC 3.87 - 5.11 MIL/uL 4.37 4.92 4.99  HGB 12.0 - 15.0 g/dL 12.5 13.8 14.0  HCT 36.0 - 46.0 % 35.9(L) 40.0 40.7  PLT 150 - 400 K/uL 290 206 311  NEUTROABS 1.7 - 7.7 K/uL 1.9 1.7 2.8  LYMPHSABS 0.7 - 4.0 K/uL 0.8 0.5(L) 0.8    CMP Latest Ref Rng & Units 06/12/2021 05/29/2021 04/25/2021  Glucose 70 - 99 mg/dL 96 92 123(H)  BUN 6 - 20 mg/dL 14 14 9   Creatinine 0.44 - 1.00 mg/dL 0.64 0.59 0.57  Sodium 135 - 145  mmol/L 142 141 142  Potassium 3.5 - 5.1 mmol/L 3.3(L) 3.8 3.6  Chloride 98 - 111 mmol/L 108 108 111  CO2 22 - 32 mmol/L 26 26 24   Calcium 8.9 - 10.3 mg/dL 9.4 9.5 9.0  Total Protein 6.5 - 8.1 g/dL 7.3 7.4 7.3  Total Bilirubin 0.3 - 1.2 mg/dL 0.6 0.6 0.3  Alkaline Phos 38 - 126 U/L 47 54 53  AST 15 - 41 U/L 29 21 41  ALT 0 - 44 U/L 45(H) 27 85(H)    No results found for: MG  Adverse Effects Assessment Mild diarrhea: has loperamide at home but not taking currently  Adherence Assessment Gabriela Sullivan reports missing 0 doses over the past 2 weeks.   Reason for missed dose:  N/A Patient was re-educated on importance of adherence.   Access Assessment Gabriela Sullivan is currently receiving her abemaciclib through [facility] concerns:  none  Medication Reconciliation The patient's medication list was reviewed today with the patient? Yes New medications or herbal supplements have recently been started? Yes , as above, started Ozempic Any medications have been discontinued? Yes , stopped Metformin The medication list was updated and reconciled based on the patient's most recent medication list in the electronic medical record (EMR) including herbal products and OTC medications.   Medications Current Outpatient Medications  Medication Sig Dispense Refill   Semaglutide (OZEMPIC, 0.25 OR 0.5 MG/DOSE, [place]) Inject 0.25 mg into the skin once a week.     abemaciclib (VERZENIO) 150 MG tablet Take 1 tablet (150 mg total) by mouth 2 (two) times daily. Swallow tablets whole. Do not chew, crush, or split tablets before swallowing. 56 tablet 0   anastrozole (ARIMIDEX) 1 MG tablet Take 1 tablet (1 mg total) by mouth daily. 90 tablet 3   calcium carbonate (TUMS - DOSED IN MG ELEMENTAL CALCIUM) 500 MG chewable tablet Chew 2 tablets by mouth every other day.     cholecalciferol (VITAMIN D3) 25 MCG (1000 UNIT) tablet Take 1,000 Units by mouth daily.     Goserelin Acetate (ZOLADEX [place]) Inject 1 Dose into the skin every 30 (thirty) days.     loperamide (IMODIUM) 2 MG capsule Take by mouth as needed for diarrhea or loose stools.     ondansetron (ZOFRAN) 8 MG tablet Take by mouth every 8 (eight) hours as needed for nausea or vomiting.     venlafaxine XR (EFFEXOR-XR) 37.5 MG 24 hr capsule Take 1 capsule (37.5 mg total) by mouth daily with breakfast. (Patient not taking: Reported on 05/29/2021) 90 capsule 4   Vitamin D, Ergocalciferol, (DRISDOL) 50000 UNITS CAPS capsule Take 50,000 Units by mouth every Monday.     No current facility-administered medications for  this visit.    Drug-Drug Interactions (DDIs) DDIs were evaluated? Yes Significant DDIs? No  The patient was instructed to speak with their health care provider and/or the oral chemotherapy pharmacist before starting any new drug, including prescription or over the counter, natural / herbal products, or vitamins.  Supportive Care Diarrhea: we reviewed that diarrhea is common with abemaciclib and confirmed that she does have loperamide (Imodium) at home.  We reviewed how to take this medication PRN Neutropenia: we discussed the importance of having a thermometer and what the Centers for Disease Control and Prevention (CDC) considers a fever which is 100.25F (38C) or higher.  Gave patient 24/7 triage line to call if any fevers or symptoms ILD/Pneumonitis: we reviewed potential symptoms including cough, shortness, and fatigue.  Hepatotoxicity: ALT at 45 U/L just over ULN (44 U/L). Other LFTs WNL. VTE: reviewed signs of DVT such as leg swelling, redness, pain, or tenderness and signs of PE such as shortness of breath, rapid or irregular heartbeat, cough, chest pain, or lightheadedness Reviewed to take the medication every 12 hours (with food sometimes can be easier on the stomach) and to take it at the same time every day.   Dosing Assessment Hepatic adjustments needed? No  Renal adjustments needed? No  Toxicity adjustments needed? No  The current dosing regimen is appropriate to continue at this time.  Follow-Up Plan Labs / pharmacy clinic visit / goserelin on 06/26/21 Labs / pharmacy clinic visit / goserelin & zoledronic acid on 07/24/21 Restaging scans ordered for Mid-March Labs / Dr. Lindi Sullivan visit / goserelin on 08/21/21  Gabriela Sullivan participated in the discussion, expressed understanding, and voiced agreement with the above plan. All questions were answered to her satisfaction. The patient was advised to contact the clinic at [phone] with any questions or concerns prior to her return  visit.   I spent 30 minutes assessing and educating the patient.  Raina Mina, RPH-CPP, 06/12/2021  11:23 AM

## 2021-06-13 ENCOUNTER — Telehealth: Payer: Self-pay

## 2021-06-13 NOTE — Telephone Encounter (Signed)
-----   Message from Gardenia Phlegm, NP sent at 06/12/2021  7:34 PM EST ----- Potassium slightly low, please tell patient to increase potassium intake  ----- Message ----- From: Interface, Lab In [place] Sent: 06/12/2021  11:08 AM EST To: Chauncey Cruel, MD

## 2021-06-13 NOTE — Telephone Encounter (Signed)
Called and spoke with pt, educated pt on potassium rich foods and instructed her to increase intake.  Pt verbalized understanding and thanks

## 2021-06-14 ENCOUNTER — Encounter: Payer: Medicaid Other | Admitting: Rehabilitation

## 2021-06-21 ENCOUNTER — Encounter: Payer: Medicaid Other | Admitting: Rehabilitation

## 2021-06-25 ENCOUNTER — Other Ambulatory Visit (HOSPITAL_COMMUNITY): Payer: Self-pay

## 2021-06-25 ENCOUNTER — Other Ambulatory Visit: Payer: Self-pay | Admitting: Hematology and Oncology

## 2021-06-26 ENCOUNTER — Encounter: Payer: Self-pay | Admitting: Hematology and Oncology

## 2021-06-26 ENCOUNTER — Inpatient Hospital Stay: Payer: Medicaid Other

## 2021-06-26 ENCOUNTER — Inpatient Hospital Stay: Payer: Medicaid Other | Admitting: Pharmacist

## 2021-06-26 ENCOUNTER — Other Ambulatory Visit: Payer: Self-pay

## 2021-06-26 ENCOUNTER — Other Ambulatory Visit: Payer: Self-pay | Admitting: Hematology and Oncology

## 2021-06-26 ENCOUNTER — Other Ambulatory Visit (HOSPITAL_COMMUNITY): Payer: Self-pay

## 2021-06-26 VITALS — BP 127/70 | HR 70 | Temp 97.5°F | Resp 18 | Ht 60.0 in | Wt 176.1 lb

## 2021-06-26 DIAGNOSIS — Z170 Estrogen receptor positive status [ER+]: Secondary | ICD-10-CM

## 2021-06-26 DIAGNOSIS — C50412 Malignant neoplasm of upper-outer quadrant of left female breast: Secondary | ICD-10-CM | POA: Diagnosis not present

## 2021-06-26 DIAGNOSIS — Z95828 Presence of other vascular implants and grafts: Secondary | ICD-10-CM

## 2021-06-26 LAB — CBC WITH DIFFERENTIAL/PLATELET
Abs Immature Granulocytes: 0.01 10*3/uL (ref 0.00–0.07)
Basophils Absolute: 0 10*3/uL (ref 0.0–0.1)
Basophils Relative: 1 %
Eosinophils Absolute: 0 10*3/uL (ref 0.0–0.5)
Eosinophils Relative: 1 %
HCT: 32 % — ABNORMAL LOW (ref 36.0–46.0)
Hemoglobin: 11.5 g/dL — ABNORMAL LOW (ref 12.0–15.0)
Immature Granulocytes: 0 %
Lymphocytes Relative: 32 %
Lymphs Abs: 1 10*3/uL (ref 0.7–4.0)
MCH: 29.9 pg (ref 26.0–34.0)
MCHC: 35.9 g/dL (ref 30.0–36.0)
MCV: 83.3 fL (ref 80.0–100.0)
Monocytes Absolute: 0.1 10*3/uL (ref 0.1–1.0)
Monocytes Relative: 4 %
Neutro Abs: 1.8 10*3/uL (ref 1.7–7.7)
Neutrophils Relative %: 62 %
Platelets: 225 10*3/uL (ref 150–400)
RBC: 3.84 MIL/uL — ABNORMAL LOW (ref 3.87–5.11)
RDW: 15.1 % (ref 11.5–15.5)
WBC: 3 10*3/uL — ABNORMAL LOW (ref 4.0–10.5)
nRBC: 0 % (ref 0.0–0.2)

## 2021-06-26 LAB — COMPREHENSIVE METABOLIC PANEL
ALT: 32 U/L (ref 0–44)
AST: 21 U/L (ref 15–41)
Albumin: 4.3 g/dL (ref 3.5–5.0)
Alkaline Phosphatase: 42 U/L (ref 38–126)
Anion gap: 8 (ref 5–15)
BUN: 15 mg/dL (ref 6–20)
CO2: 26 mmol/L (ref 22–32)
Calcium: 9.4 mg/dL (ref 8.9–10.3)
Chloride: 108 mmol/L (ref 98–111)
Creatinine, Ser: 0.62 mg/dL (ref 0.44–1.00)
GFR, Estimated: >60 mL/min (ref >60)
Glucose, Bld: 88 mg/dL (ref 70–99)
Potassium: 3.2 mmol/L — ABNORMAL LOW (ref 3.5–5.1)
Sodium: 142 mmol/L (ref 135–145)
Total Bilirubin: 0.5 mg/dL (ref 0.3–1.2)
Total Protein: 7.1 g/dL (ref 6.5–8.1)

## 2021-06-26 MED ORDER — POTASSIUM CHLORIDE CRYS ER 20 MEQ PO TBCR
40.0000 meq | EXTENDED_RELEASE_TABLET | Freq: Once | ORAL | Status: AC
  Administered 2021-06-26: 12:00:00 40 meq via ORAL
  Filled 2021-06-26: qty 2

## 2021-06-26 MED ORDER — GOSERELIN ACETATE 3.6 MG ~~LOC~~ IMPL
3.6000 mg | DRUG_IMPLANT | Freq: Once | SUBCUTANEOUS | Status: AC
  Administered 2021-06-26: 12:00:00 3.6 mg via SUBCUTANEOUS
  Filled 2021-06-26: qty 3.6

## 2021-06-26 MED ORDER — ABEMACICLIB 150 MG PO TABS
150.0000 mg | ORAL_TABLET | Freq: Two times a day (BID) | ORAL | 3 refills | Status: DC
  Filled 2021-06-26: qty 56, 28d supply, fill #0
  Filled 2021-07-19: qty 56, 28d supply, fill #1
  Filled 2021-08-14: qty 56, 28d supply, fill #2
  Filled 2021-09-11: qty 56, 28d supply, fill #3

## 2021-06-26 MED ORDER — POTASSIUM CHLORIDE CRYS ER 20 MEQ PO TBCR: 20.0000 meq | EXTENDED_RELEASE_TABLET | Freq: Every day | ORAL | 2 refills | Status: DC

## 2021-06-26 NOTE — Progress Notes (Signed)
[place]       Telephone: [phone]?Fax: [phone]   Oncology Clinical Pharmacist Practitioner Progress Note  Gabriela Sullivan was contacted via in-person to discuss her chemotherapy regimen for abemaciclib which they receive under the care of Dr. Nicholas Lose.  Current treatment regimen and start date Abemaciclib (05/07/21) Goserelin (10/11/20) Anastrozole (04/25/21) Zoledronic acid (11/01/20)  Interval History She continues on abemaciclib 150 mg by mouth  every 12 hours on days 1 to 28 of a 28-day cycle. This is being given  in combination with goserelin, anastrozole, and zoledronic acid . Therapy is planned to continue until disease progression or unacceptable toxicity.   Response to Therapy Gabriela Sullivan is doing well.  She was last seen by clinical pharmacy on January 10.  She last saw Dr. Lindi Adie on November 23.  She continues to tolerate abemaciclib full dose well.  She states that she had some diarrhea when she increased to the full dose at the start of the new year, but this has subsided.  She has not needed to use any loperamide yet.  At her last visit, her potassium was slightly low at 3.3 mmol/L, today it is 3.2 mmol/L.  We have ordered potassium chloride 40 mEq by mouth to be given while she is receiving her goserelin injection today.  We have also ordered a prescription for 20 mEq by mouth daily which she will take and be reassessed at her next pharmacy visit in 1 month.  This has been communicated to the nursing staff and to Gabriela Sullivan.  Gabriela Sullivan is in agreement with this plan. We have also given Gabriela Sullivan written information on some foods that are rich in potassium to try.  A refill request was sent by the [facility] today and this will be filled. Labs, vitals, treatment parameters, and manufacturer guidelines assessing toxicity were reviewed with Gabriela Sullivan today. Based on these values, patient is in agreement to continue therapy at this time.  Allergies No  Known Allergies  Vitals Vitals with BMI 06/26/2021 06/12/2021 05/29/2021  Height 5\' 0"  5\' 0"  5\' 0"   Weight 176 lbs 2 oz 178 lbs 5 oz 179 lbs 14 oz  BMI 34.39 00.92 33.00  Systolic 762 263 335  Diastolic 70 83 72  Pulse 70 70 77     Laboratory Data CBC EXTENDED Latest Ref Rng & Units 06/26/2021 06/12/2021 05/29/2021  WBC 4.0 - 10.5 K/uL 3.0(L) 2.9(L) 2.3(L)  RBC 3.87 - 5.11 MIL/uL 3.84(L) 4.37 4.92  HGB 12.0 - 15.0 g/dL 11.5(L) 12.5 13.8  HCT 36.0 - 46.0 % 32.0(L) 35.9(L) 40.0  PLT 150 - 400 K/uL 225 290 206  NEUTROABS 1.7 - 7.7 K/uL 1.8 1.9 1.7  LYMPHSABS 0.7 - 4.0 K/uL 1.0 0.8 0.5(L)    CMP Latest Ref Rng & Units 06/26/2021 06/12/2021 05/29/2021  Glucose 70 - 99 mg/dL 88 96 92  BUN 6 - 20 mg/dL 15 14 14   Creatinine 0.44 - 1.00 mg/dL 0.62 0.64 0.59  Sodium 135 - 145 mmol/L 142 142 141  Potassium 3.5 - 5.1 mmol/L 3.2(L) 3.3(L) 3.8  Chloride 98 - 111 mmol/L 108 108 108  CO2 22 - 32 mmol/L 26 26 26   Calcium 8.9 - 10.3 mg/dL 9.4 9.4 9.5  Total Protein 6.5 - 8.1 g/dL 7.1 7.3 7.4  Total Bilirubin 0.3 - 1.2 mg/dL 0.5 0.6 0.6  Alkaline Phos 38 - 126 U/L 42 47 54  AST 15 - 41 U/L 21 29 21   ALT  0 - 44 U/L 32 45(H) 27    No results found for: MG  Adverse Effects Assessment Diarrhea: as above, patient describes some loose stools when first increasing dose of abemaciclib to 150 mg every 12 hours. This has subsided.  She did not need to use loperamide yet.  Adherence Assessment Gabriela Sullivan reports missing 0 doses over the past 2 weeks.   Reason for missed dose: N/A Patient was re-educated on importance of adherence.   Access Assessment Gabriela Sullivan is currently receiving her abemaciclib through [facility] concerns:  none  Medication Reconciliation The patient's medication list was reviewed today with the patient? Yes New medications or herbal supplements have recently been started? No  Any medications have been discontinued? No  The medication list was  updated and reconciled based on the patient's most recent medication list in the electronic medical record (EMR) including herbal products and OTC medications.   Medications Current Outpatient Medications  Medication Sig Dispense Refill   potassium chloride SA (KLOR-CON M) 20 MEQ tablet Take 1 tablet (20 mEq total) by mouth daily. 30 tablet 2   abemaciclib (VERZENIO) 150 MG tablet Take 1 tablet (150 mg total) by mouth 2 (two) times daily. Swallow tablets whole. Do not chew, crush, or split tablets before swallowing. 56 tablet 0   anastrozole (ARIMIDEX) 1 MG tablet Take 1 tablet (1 mg total) by mouth daily. 90 tablet 3   calcium carbonate (TUMS - DOSED IN MG ELEMENTAL CALCIUM) 500 MG chewable tablet Chew 2 tablets by mouth every other day.     cholecalciferol (VITAMIN D3) 25 MCG (1000 UNIT) tablet Take 1,000 Units by mouth daily.     Goserelin Acetate (ZOLADEX [place]) Inject 1 Dose into the skin every 30 (thirty) days.     loperamide (IMODIUM) 2 MG capsule Take by mouth as needed for diarrhea or loose stools.     ondansetron (ZOFRAN) 8 MG tablet Take by mouth every 8 (eight) hours as needed for nausea or vomiting.     Semaglutide (OZEMPIC, 0.25 OR 0.5 MG/DOSE, [place]) Inject 0.25 mg into the skin once a week.     venlafaxine XR (EFFEXOR-XR) 37.5 MG 24 hr capsule Take 1 capsule (37.5 mg total) by mouth daily with breakfast. (Patient not taking: Reported on 05/29/2021) 90 capsule 4   Vitamin D, Ergocalciferol, (DRISDOL) 50000 UNITS CAPS capsule Take 50,000 Units by mouth every Monday.     No current facility-administered medications for this visit.    Drug-Drug Interactions (DDIs) DDIs were evaluated? Yes Significant DDIs? No  The patient was instructed to speak with their health care provider and/or the oral chemotherapy pharmacist before starting any new drug, including prescription or over the counter, natural / herbal products, or vitamins.  Supportive Care Diarrhea: we reviewed that diarrhea is  common with abemaciclib and confirmed that she does have loperamide (Imodium) at home.  We reviewed how to take this medication PRN Neutropenia: we discussed the importance of having a thermometer and what the Centers for Disease Control and Prevention (CDC) considers a fever which is 100.67F (38C) or higher.  Gave patient 24/7 triage line to call if any fevers or symptoms ILD/Pneumonitis: we reviewed potential symptoms including cough, shortness, and fatigue.  Hepatotoxicity: WNL. Has NASH, followed closely VTE: reviewed signs of DVT such as leg swelling, redness, pain, or tenderness and signs of PE such as shortness of breath, rapid or irregular heartbeat, cough, chest pain, or lightheadedness Reviewed to take the medication every  12 hours (with food sometimes can be easier on the stomach) and to take it at the same time every day.   Dosing Assessment Hepatic adjustments needed? No  Renal adjustments needed? No  Toxicity adjustments needed? No  The current dosing regimen is appropriate to continue at this time.  Follow-Up Plan Goserelin today with potassium chloride 40 mEq by mouth for K level of 3.2 mmol/L Prescription refill sent for abemaciclib sent to [facility] for potassium chloride 20 mEq by mouth daily sent to local [place] [facility] / pharmacy visit / goserelin / zoledronic acid due 07/24/21 Restaging in Mid-March (orders placed 05/29/21) with follow up appointment set with Dr. Lindi Adie on 08/21/21  Gabriela Sullivan participated in the discussion, expressed understanding, and voiced agreement with the above plan. All questions were answered to her satisfaction. The patient was advised to contact the clinic at [phone] with any questions or concerns prior to her return visit.   I spent 30 minutes assessing and educating the patient.  Raina Mina, RPH-CPP, 06/26/2021  12:55 PM

## 2021-06-26 NOTE — Telephone Encounter (Signed)
Gabriela Sullivan!  This came to [place] this morning and I see you are meeting with her today.

## 2021-06-28 ENCOUNTER — Other Ambulatory Visit (HOSPITAL_COMMUNITY): Payer: Self-pay

## 2021-07-19 ENCOUNTER — Other Ambulatory Visit (HOSPITAL_COMMUNITY): Payer: Self-pay

## 2021-07-24 ENCOUNTER — Inpatient Hospital Stay: Payer: Medicaid Other | Admitting: Pharmacist

## 2021-07-24 ENCOUNTER — Inpatient Hospital Stay: Payer: Medicaid Other

## 2021-07-24 ENCOUNTER — Other Ambulatory Visit: Payer: Self-pay

## 2021-07-24 ENCOUNTER — Inpatient Hospital Stay: Payer: Medicaid Other | Attending: Oncology

## 2021-07-24 ENCOUNTER — Ambulatory Visit: Payer: Medicaid Other

## 2021-07-24 VITALS — BP 139/89 | HR 76 | Temp 98.4°F | Resp 18

## 2021-07-24 VITALS — BP 146/86 | HR 72 | Temp 97.5°F | Resp 16 | Ht 60.0 in | Wt 175.7 lb

## 2021-07-24 DIAGNOSIS — C50412 Malignant neoplasm of upper-outer quadrant of left female breast: Secondary | ICD-10-CM | POA: Diagnosis present

## 2021-07-24 DIAGNOSIS — Z95828 Presence of other vascular implants and grafts: Secondary | ICD-10-CM

## 2021-07-24 DIAGNOSIS — Z170 Estrogen receptor positive status [ER+]: Secondary | ICD-10-CM | POA: Diagnosis not present

## 2021-07-24 DIAGNOSIS — Z5111 Encounter for antineoplastic chemotherapy: Secondary | ICD-10-CM | POA: Diagnosis present

## 2021-07-24 LAB — CBC WITH DIFFERENTIAL/PLATELET
Abs Immature Granulocytes: 0.01 10*3/uL (ref 0.00–0.07)
Basophils Absolute: 0 10*3/uL (ref 0.0–0.1)
Basophils Relative: 1 %
Eosinophils Absolute: 0 10*3/uL (ref 0.0–0.5)
Eosinophils Relative: 1 %
HCT: 32.5 % — ABNORMAL LOW (ref 36.0–46.0)
Hemoglobin: 11.8 g/dL — ABNORMAL LOW (ref 12.0–15.0)
Immature Granulocytes: 0 %
Lymphocytes Relative: 29 %
Lymphs Abs: 0.8 10*3/uL (ref 0.7–4.0)
MCH: 31.7 pg (ref 26.0–34.0)
MCHC: 36.3 g/dL — ABNORMAL HIGH (ref 30.0–36.0)
MCV: 87.4 fL (ref 80.0–100.0)
Monocytes Absolute: 0.1 10*3/uL (ref 0.1–1.0)
Monocytes Relative: 4 %
Neutro Abs: 1.8 10*3/uL (ref 1.7–7.7)
Neutrophils Relative %: 65 %
Platelets: 251 10*3/uL (ref 150–400)
RBC: 3.72 MIL/uL — ABNORMAL LOW (ref 3.87–5.11)
RDW: 16.5 % — ABNORMAL HIGH (ref 11.5–15.5)
WBC: 2.8 10*3/uL — ABNORMAL LOW (ref 4.0–10.5)
nRBC: 0 % (ref 0.0–0.2)

## 2021-07-24 LAB — COMPREHENSIVE METABOLIC PANEL
ALT: 32 U/L (ref 0–44)
AST: 26 U/L (ref 15–41)
Albumin: 4.6 g/dL (ref 3.5–5.0)
Alkaline Phosphatase: 41 U/L (ref 38–126)
Anion gap: 7 (ref 5–15)
BUN: 17 mg/dL (ref 6–20)
CO2: 27 mmol/L (ref 22–32)
Calcium: 9.5 mg/dL (ref 8.9–10.3)
Chloride: 108 mmol/L (ref 98–111)
Creatinine, Ser: 0.61 mg/dL (ref 0.44–1.00)
GFR, Estimated: >60 mL/min (ref >60)
Glucose, Bld: 88 mg/dL (ref 70–99)
Potassium: 3.3 mmol/L — ABNORMAL LOW (ref 3.5–5.1)
Sodium: 142 mmol/L (ref 135–145)
Total Bilirubin: 0.7 mg/dL (ref 0.3–1.2)
Total Protein: 7.6 g/dL (ref 6.5–8.1)

## 2021-07-24 MED ORDER — SODIUM CHLORIDE 0.9% FLUSH
10.0000 mL | Freq: Once | INTRAVENOUS | Status: AC
  Administered 2021-07-24: 10 mL via INTRAVENOUS

## 2021-07-24 MED ORDER — HEPARIN SOD (PORK) LOCK FLUSH 100 UNIT/ML IV SOLN
500.0000 [IU] | Freq: Once | INTRAVENOUS | Status: AC
  Administered 2021-07-24: 500 [IU] via INTRAVENOUS

## 2021-07-24 MED ORDER — GOSERELIN ACETATE 3.6 MG ~~LOC~~ IMPL
3.6000 mg | DRUG_IMPLANT | Freq: Once | SUBCUTANEOUS | Status: AC
  Administered 2021-07-24: 3.6 mg via SUBCUTANEOUS
  Filled 2021-07-24: qty 3.6

## 2021-07-24 MED ORDER — POTASSIUM CHLORIDE CRYS ER 20 MEQ PO TBCR: 20.0000 meq | EXTENDED_RELEASE_TABLET | Freq: Two times a day (BID) | ORAL | 2 refills | Status: DC

## 2021-07-24 MED ORDER — SODIUM CHLORIDE 0.9 % IV SOLN: INTRAVENOUS | Status: DC

## 2021-07-24 MED ORDER — ZOLEDRONIC ACID 4 MG/100ML IV SOLN
4.0000 mg | Freq: Once | INTRAVENOUS | Status: AC
  Administered 2021-07-24: 4 mg via INTRAVENOUS
  Filled 2021-07-24: qty 100

## 2021-07-24 NOTE — Progress Notes (Signed)
[place]       Telephone: [phone]?Fax: [phone]   Oncology Clinical Pharmacist Practitioner Progress Note  Gabriela Sullivan was contacted via in-person to discuss her chemotherapy regimen for abemaciclib which they receive under the care of Dr. Nicholas Lose.  Current treatment regimen and start date Abemaciclib (05/07/21) Goserelin (10/11/20) Anastrozole (04/25/21) Zoledronic acid (11/01/20)  Interval History She continues on abemaciclib 150 mg by mouth  every 12 hours on days 1 to 28 of a 28-day cycle. This is being given  in combination with goserelin, anastrozole, and zoledronic acid. Goserelin and zoledronic acid will be given today . Therapy is planned to continue until disease progression or unacceptable toxicity.   Response to Therapy Gabriela Sullivan is doing well.  She last saw clinical pharmacy on January 24.  She last saw Dr. Lindi Adie on November 23.  She will be receiving her every 4-week goserelin injection today and her every 29-month zoledronic acid infusion today.  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  She has since quit working at the second job and feels her back pain is much more manageable.  She is not reporting any loose stools or any side effects that would be associated with abemaciclib at this time.  She does report having some headaches in the morning as of late, but they dissipate shortly after waking up.  She does not report having any vision changes, gait issues, or unsteadiness.  We discussed that if the symptoms should get any worse, that she would want to contact the clinic so that we can order the appropriate scans.  She verbalized understanding.  She will next see Dr. Lindi Adie in 4 weeks with labs and at that point he will review her restaging scans which have been ordered.  She will also receive goserelin at that time.  If her scans show stable disease or improvement, she  will likely continue on her current treatment regimen and see clinical pharmacy again in 8 weeks.. Labs, vitals, treatment parameters, and manufacturer guidelines assessing toxicity were reviewed with Gabriela Sullivan today. Her potassium continues to be low at 3.3 mmol/L. We discussed increasing her potassium supplement to 20 mEq twice daily. A new prescription has been sent to her pharmacy of choice.  Potassium containing food choices were given to her at her last visit. Based on her lab values, patient is in agreement to continue therapy with abemaciclib at this time.  Allergies No Known Allergies  Vitals Vitals with BMI 07/24/2021 06/26/2021 06/12/2021  Height 5\' 0"  5\' 0"  5\' 0"   Weight 175 lbs 11 oz 176 lbs 2 oz 178 lbs 5 oz  BMI 34.31 18.56 31.49  Systolic 702 637 858  Diastolic 86 70 83  Pulse 72 70 70     Laboratory Data CBC EXTENDED Latest Ref Rng & Units 07/24/2021 06/26/2021 06/12/2021  WBC 4.0 - 10.5 K/uL 2.8(L) 3.0(L) 2.9(L)  RBC 3.87 - 5.11 MIL/uL 3.72(L) 3.84(L) 4.37  HGB 12.0 - 15.0 g/dL 11.8(L) 11.5(L) 12.5  HCT 36.0 - 46.0 % 32.5(L) 32.0(L) 35.9(L)  PLT 150 - 400 K/uL 251 225 290  NEUTROABS 1.7 - 7.7 K/uL 1.8 1.8 1.9  LYMPHSABS 0.7 - 4.0 K/uL 0.8 1.0 0.8    CMP Latest Ref Rng & Units 07/24/2021 06/26/2021 06/12/2021  Glucose 70 - 99 mg/dL 88 88 96  BUN 6 - 20 mg/dL 17 15 14  Creatinine 0.44 - 1.00 mg/dL 0.61 0.62 0.64  Sodium 135 - 145 mmol/L 142 142 142  Potassium 3.5 - 5.1 mmol/L 3.3(L) 3.2(L) 3.3(L)  Chloride 98 - 111 mmol/L 108 108 108  CO2 22 - 32 mmol/L 27 26 26   Calcium 8.9 - 10.3 mg/dL 9.5 9.4 9.4  Total Protein 6.5 - 8.1 g/dL 7.6 7.1 7.3  Total Bilirubin 0.3 - 1.2 mg/dL 0.7 0.5 0.6  Alkaline Phos 38 - 126 U/L 41 42 47  AST 15 - 41 U/L 26 21 29   ALT 0 - 44 U/L 32 32 45(H)    No results found for: MG  Adverse Effects Assessment Hypokalemia: slightly improved to 3.3 mmol/L today.  She will increase potassium supplement to every 12 hour dosing. Currently  daily.  Adherence Assessment Gabriela Sullivan reports missing 0 doses over the past 4 weeks.   Reason for missed dose: N/A Patient was re-educated on importance of adherence.   Access Assessment Gabriela Sullivan is currently receiving her abemaciclib through [facility] concerns:  no  Medication Reconciliation The patient's medication list was reviewed today with the patient? Yes New medications or herbal supplements have recently been started? No  Any medications have been discontinued? No  The medication list was updated and reconciled based on the patient's most recent medication list in the electronic medical record (EMR) including herbal products and OTC medications.   Medications Current Outpatient Medications  Medication Sig Dispense Refill   abemaciclib (VERZENIO) 150 MG tablet Take 1 tablet (150 mg total) by mouth 2 (two) times daily. Swallow tablets whole. Do not chew, crush, or split tablets before swallowing. 56 tablet 3   anastrozole (ARIMIDEX) 1 MG tablet Take 1 tablet (1 mg total) by mouth daily. 90 tablet 3   calcium carbonate (TUMS - DOSED IN MG ELEMENTAL CALCIUM) 500 MG chewable tablet Chew 2 tablets by mouth every other day.     cholecalciferol (VITAMIN D3) 25 MCG (1000 UNIT) tablet Take 1,000 Units by mouth daily.     Goserelin Acetate (ZOLADEX [place]) Inject 1 Dose into the skin every 30 (thirty) days.     loperamide (IMODIUM) 2 MG capsule Take by mouth as needed for diarrhea or loose stools.     ondansetron (ZOFRAN) 8 MG tablet Take by mouth every 8 (eight) hours as needed for nausea or vomiting.     potassium chloride SA (KLOR-CON M) 20 MEQ tablet Take 1 tablet (20 mEq total) by mouth 2 (two) times daily. 30 tablet 2   Semaglutide (OZEMPIC, 0.25 OR 0.5 MG/DOSE, ) Inject 0.25 mg into the skin once a week.     venlafaxine XR (EFFEXOR-XR) 37.5 MG 24 hr capsule Take 1 capsule (37.5 mg total) by mouth daily with breakfast. (Patient not taking: Reported  on 05/29/2021) 90 capsule 4   Vitamin D, Ergocalciferol, (DRISDOL) 50000 UNITS CAPS capsule Take 50,000 Units by mouth every Monday.     No current facility-administered medications for this visit.    Drug-Drug Interactions (DDIs) DDIs were evaluated? Yes Significant DDIs? No  The patient was instructed to speak with their health care provider and/or the oral chemotherapy pharmacist before starting any new drug, including prescription or over the counter, natural / herbal products, or vitamins.  Supportive Care Diarrhea: we reviewed that diarrhea is common with abemaciclib and confirmed that she does have loperamide (Imodium) at home.  We reviewed how to take this medication PRN Neutropenia: we discussed the importance of having a thermometer and  what the Centers for Disease Control and Prevention (CDC) considers a fever which is 100.4F (38C) or higher.  Gave patient 24/7 triage line to call if any fevers or symptoms ILD/Pneumonitis: we reviewed potential symptoms including cough, shortness, and fatigue.  Hepatotoxicity: WNL VTE: reviewed signs of DVT such as leg swelling, redness, pain, or tenderness and signs of PE such as shortness of breath, rapid or irregular heartbeat, cough, chest pain, or lightheadedness Reviewed to take the medication every 12 hours (with food sometimes can be easier on the stomach) and to take it at the same time every day.  Potassium: continue to monitor  Dosing Assessment Hepatic adjustments needed? No  Renal adjustments needed? No  Toxicity adjustments needed? No  The current dosing regimen is appropriate to continue at this time.  Follow-Up Plan Increase potassium supplement to 20 mEq every 12 hours.  Prescription sent See Dr. Lindi Adie in 4 weeks to review restaging scans, labs, and goserelin See pharmacy clinic in 8 weeks with labs and goserelin  Gabriela Sullivan participated in the discussion, expressed understanding, and voiced agreement with the above plan.  All questions were answered to her satisfaction. The patient was advised to contact the clinic at [phone] with any questions or concerns prior to her return visit.   I spent 30 minutes assessing and educating the patient.  Raina Mina, RPH-CPP, 07/24/2021  11:33 AM   **Disclaimer: This note was dictated with voice recognition software. Similar sounding words can inadvertently be transcribed and this note may contain transcription errors which may not have been corrected upon publication of note.**

## 2021-07-24 NOTE — Patient Instructions (Signed)
Goserelin injection °What is this medication? °GOSERELIN (GOE se rel in) is similar to a hormone found in the body. It lowers the amount of sex hormones that the body makes. Men will have lower testosterone levels and women will have lower estrogen levels while taking this medicine. In men, this medicine is used to treat prostate cancer; the injection is either given once per month or once every 12 weeks. A once per month injection (only) is used to treat women with endometriosis, dysfunctional uterine bleeding, or advanced breast cancer. °This medicine may be used for other purposes; ask your health care provider or pharmacist if you have questions. °COMMON BRAND NAME(S): Zoladex, Zoladex 3-Month °What should I tell my care team before I take this medication? °They need to know if you have any of these conditions: °bone problems °diabetes °heart disease °history of irregular heartbeat °an unusual or allergic reaction to goserelin, other medicines, foods, dyes, or preservatives °pregnant or trying to get pregnant °breast-feeding °How should I use this medication? °This medicine is for injection under the skin. It is given by a health care professional in a hospital or clinic setting. °Talk to your pediatrician regarding the use of this medicine in children. Special care may be needed. °Overdosage: If you think you have taken too much of this medicine contact a poison control center or emergency room at once. °NOTE: This medicine is only for you. Do not share this medicine with others. °What if I miss a dose? °It is important not to miss your dose. Call your doctor or health care professional if you are unable to keep an appointment. °What may interact with this medication? °Do not take this medicine with any of the following medications: °cisapride °dronedarone °pimozide °thioridazine °This medicine may also interact with the following medications: °other medicines that prolong the QT interval (an abnormal heart  rhythm) °This list may not describe all possible interactions. Give your health care provider a list of all the medicines, herbs, non-prescription drugs, or dietary supplements you use. Also tell them if you smoke, drink alcohol, or use illegal drugs. Some items may interact with your medicine. °What should I watch for while using this medication? °Visit your doctor or health care provider for regular checks on your progress. Your symptoms may appear to get worse during the first weeks of this therapy. Tell your doctor or healthcare provider if your symptoms do not start to get better or if they get worse after this time. °Your bones may get weaker if you take this medicine for a long time. If you smoke or frequently drink alcohol you may increase your risk of bone loss. A family history of osteoporosis, chronic use of drugs for seizures (convulsions), or corticosteroids can also increase your risk of bone loss. Talk to your doctor about how to keep your bones strong. °This medicine should stop regular monthly menstruation in women. Tell your doctor if you continue to menstruate. °Women should not become pregnant while taking this medicine or for 12 weeks after stopping this medicine. Women should inform their doctor if they wish to become pregnant or think they might be pregnant. There is a potential for serious side effects to an unborn child. Talk to your health care professional or pharmacist for more information. Do not breast-feed an infant while taking this medicine. °Men should inform their doctors if they wish to father a child. This medicine may lower sperm counts. Talk to your health care professional or pharmacist for more information. °This   medicine may increase blood sugar. Ask your healthcare provider if changes in diet or medicines are needed if you have diabetes. What side effects may I notice from receiving this medication? Side effects that you should report to your doctor or health care  professional as soon as possible: allergic reactions like skin rash, itching or hives, swelling of the face, lips, or tongue bone pain breathing problems changes in vision chest pain feeling faint or lightheaded, falls fever, chills pain, swelling, warmth in the leg pain, tingling, numbness in the hands or feet signs and symptoms of high blood sugar such as being more thirsty or hungry or having to urinate more than normal. You may also feel very tired or have blurry vision signs and symptoms of low blood pressure like dizziness; feeling faint or lightheaded, falls; unusually weak or tired stomach pain swelling of the ankles, feet, hands trouble passing urine or change in the amount of urine unusually high or low blood pressure unusually weak or tired Side effects that usually do not require medical attention (report to your doctor or health care professional if they continue or are bothersome): change in sex drive or performance changes in breast size in both males and females changes in emotions or moods headache hot flashes irritation at site where injected loss of appetite skin problems like acne, dry skin vaginal dryness This list may not describe all possible side effects. Call your doctor for medical advice about side effects. You may report side effects to FDA at 1-800-FDA-1088. Where should I keep my medication? This drug is given in a hospital or clinic and will not be stored at home. NOTE: This sheet is a summary. It may not cover all possible information. If you have questions about this medicine, talk to your doctor, pharmacist, or health care provider. Zoledronic Acid Injection (Hypercalcemia, Oncology) What is this medication? ZOLEDRONIC ACID (ZOE le dron ik AS id) slows calcium loss from bones. It high calcium levels in the blood from some kinds of cancer. It may be used in other people at risk for bone loss. This medicine may be used for other purposes; ask your  health care provider or pharmacist if you have questions. COMMON BRAND NAME(S): Zometa What should I tell my care team before I take this medication? They need to know if you have any of these conditions: cancer dehydration dental disease kidney disease liver disease low levels of calcium in the blood lung or breathing disease (asthma) receiving steroids like dexamethasone or prednisone an unusual or allergic reaction to zoledronic acid, other medicines, foods, dyes, or preservatives pregnant or trying to get pregnant breast-feeding How should I use this medication? This drug is injected into a vein. It is given by a health care provider in a hospital or clinic setting. Talk to your health care provider about the use of this drug in children. Special care may be needed. Overdosage: If you think you have taken too much of this medicine contact a poison control center or emergency room at once. NOTE: This medicine is only for you. Do not share this medicine with others. What if I miss a dose? Keep appointments for follow-up doses. It is important not to miss your dose. Call your health care provider if you are unable to keep an appointment. What may interact with this medication? certain antibiotics given by injection NSAIDs, medicines for pain and inflammation, like ibuprofen or naproxen some diuretics like bumetanide, furosemide teriparatide thalidomide This list may not describe all possible  interactions. Give your health care provider a list of all the medicines, herbs, non-prescription drugs, or dietary supplements you use. Also tell them if you smoke, drink alcohol, or use illegal drugs. Some items may interact with your medicine. What should I watch for while using this medication? Visit your health care provider for regular checks on your progress. It may be some time before you see the benefit from this drug. Some people who take this drug have severe bone, joint, or muscle  pain. This drug may also increase your risk for jaw problems or a broken thigh bone. Tell your health care provider right away if you have severe pain in your jaw, bones, joints, or muscles. Tell you health care provider if you have any pain that does not go away or that gets worse. Tell your dentist and dental surgeon that you are taking this drug. You should not have major dental surgery while on this drug. See your dentist to have a dental exam and fix any dental problems before starting this drug. Take good care of your teeth while on this drug. Make sure you see your dentist for regular follow-up appointments. You should make sure you get enough calcium and vitamin D while you are taking this drug. Discuss the foods you eat and the vitamins you take with your health care provider. Check with your health care provider if you have severe diarrhea, nausea, and vomiting, or if you sweat a lot. The loss of too much body fluid may make it dangerous for you to take this drug. You may need blood work done while you are taking this drug. Do not become pregnant while taking this drug. Women should inform their health care provider if they wish to become pregnant or think they might be pregnant. There is potential for serious harm to an unborn child. Talk to your health care provider for more information. What side effects may I notice from receiving this medication? Side effects that you should report to your doctor or health care provider as soon as possible: allergic reactions (skin rash, itching or hives; swelling of the face, lips, or tongue) bone pain infection (fever, chills, cough, sore throat, pain or trouble passing urine) jaw pain, especially after dental work joint pain kidney injury (trouble passing urine or change in the amount of urine) low blood pressure (dizziness; feeling faint or lightheaded, falls; unusually weak or tired) low calcium levels (fast heartbeat; muscle cramps or pain; pain,  tingling, or numbness in the hands or feet; seizures) low magnesium levels (fast, irregular heartbeat; muscle cramp or pain; muscle weakness; tremors; seizures) low red blood cell counts (trouble breathing; feeling faint; lightheaded, falls; unusually weak or tired) muscle pain redness, blistering, peeling, or loosening of the skin, including inside the mouth severe diarrhea swelling of the ankles, feet, hands trouble breathing Side effects that usually do not require medical attention (report to your doctor or health care provider if they continue or are bothersome): anxious constipation coughing depressed mood eye irritation, itching, or pain fever general ill feeling or flu-like symptoms nausea pain, redness, or irritation at site where injected trouble sleeping This list may not describe all possible side effects. Call your doctor for medical advice about side effects. You may report side effects to FDA at 1-800-FDA-1088. Where should I keep my medication? This drug is given in a hospital or clinic. It will not be stored at home. NOTE: This sheet is a summary. It may not cover all possible information. If  you have questions about this medicine, talk to your doctor, pharmacist, or health care provider.  2022 [employer] (2021-02-06 00:00:00)   2022 [employer] (2018-09-18 00:00:00)

## 2021-07-25 ENCOUNTER — Other Ambulatory Visit (HOSPITAL_COMMUNITY): Payer: Self-pay

## 2021-08-03 ENCOUNTER — Encounter (HOSPITAL_COMMUNITY): Payer: Self-pay

## 2021-08-03 ENCOUNTER — Other Ambulatory Visit: Payer: Self-pay

## 2021-08-03 ENCOUNTER — Emergency Department (HOSPITAL_COMMUNITY)
Admission: EM | Admit: 2021-08-03 | Discharge: 2021-08-04 | Disposition: A | Discharge status: Home / Self Care | Payer: Medicaid Other | Attending: Emergency Medicine | Admitting: Emergency Medicine

## 2021-08-03 ENCOUNTER — Telehealth: Payer: Self-pay | Admitting: *Deleted

## 2021-08-03 DIAGNOSIS — R1110 Vomiting, unspecified: Secondary | ICD-10-CM | POA: Diagnosis not present

## 2021-08-03 DIAGNOSIS — R1013 Epigastric pain: Secondary | ICD-10-CM | POA: Diagnosis not present

## 2021-08-03 DIAGNOSIS — R519 Headache, unspecified: Secondary | ICD-10-CM | POA: Insufficient documentation

## 2021-08-03 DIAGNOSIS — R1010 Upper abdominal pain, unspecified: Secondary | ICD-10-CM

## 2021-08-03 LAB — CBC WITH DIFFERENTIAL/PLATELET
Abs Immature Granulocytes: 0.01 10*3/uL (ref 0.00–0.07)
Basophils Absolute: 0 10*3/uL (ref 0.0–0.1)
Basophils Relative: 1 %
Eosinophils Absolute: 0 10*3/uL (ref 0.0–0.5)
Eosinophils Relative: 1 %
HCT: 34.3 % — ABNORMAL LOW (ref 36.0–46.0)
Hemoglobin: 12.6 g/dL (ref 12.0–15.0)
Immature Granulocytes: 0 %
Lymphocytes Relative: 31 %
Lymphs Abs: 1.3 10*3/uL (ref 0.7–4.0)
MCH: 33.2 pg (ref 26.0–34.0)
MCHC: 36.7 g/dL — ABNORMAL HIGH (ref 30.0–36.0)
MCV: 90.3 fL (ref 80.0–100.0)
Monocytes Absolute: 0.2 10*3/uL (ref 0.1–1.0)
Monocytes Relative: 4 %
Neutro Abs: 2.5 10*3/uL (ref 1.7–7.7)
Neutrophils Relative %: 63 %
Platelets: 285 10*3/uL (ref 150–400)
RBC: 3.8 MIL/uL — ABNORMAL LOW (ref 3.87–5.11)
RDW: 17 % — ABNORMAL HIGH (ref 11.5–15.5)
WBC: 4 10*3/uL (ref 4.0–10.5)
nRBC: 0 % (ref 0.0–0.2)

## 2021-08-03 LAB — COMPREHENSIVE METABOLIC PANEL
ALT: 35 U/L (ref 0–44)
AST: 32 U/L (ref 15–41)
Albumin: 4.9 g/dL (ref 3.5–5.0)
Alkaline Phosphatase: 41 U/L (ref 38–126)
Anion gap: 10 (ref 5–15)
BUN: 16 mg/dL (ref 6–20)
CO2: 25 mmol/L (ref 22–32)
Calcium: 9.5 mg/dL (ref 8.9–10.3)
Chloride: 104 mmol/L (ref 98–111)
Creatinine, Ser: 0.64 mg/dL (ref 0.44–1.00)
GFR, Estimated: >60 mL/min (ref >60)
Glucose, Bld: 95 mg/dL (ref 70–99)
Potassium: 3.8 mmol/L (ref 3.5–5.1)
Sodium: 139 mmol/L (ref 135–145)
Total Bilirubin: 1.1 mg/dL (ref 0.3–1.2)
Total Protein: 8.5 g/dL — ABNORMAL HIGH (ref 6.5–8.1)

## 2021-08-03 LAB — URINALYSIS, ROUTINE W REFLEX MICROSCOPIC
Bilirubin Urine: NEGATIVE
Glucose, UA: NEGATIVE mg/dL
Hgb urine dipstick: NEGATIVE
Ketones, ur: NEGATIVE mg/dL
Nitrite: NEGATIVE
Protein, ur: NEGATIVE mg/dL
Specific Gravity, Urine: 1.023 (ref 1.005–1.030)
pH: 5 (ref 5.0–8.0)

## 2021-08-03 LAB — PROTIME-INR
INR: 1.1 (ref 0.8–1.2)
Prothrombin Time: 14 seconds (ref 11.4–15.2)

## 2021-08-03 LAB — LIPASE, BLOOD: Lipase: 41 U/L (ref 11–51)

## 2021-08-03 MED ORDER — SODIUM CHLORIDE 0.9 % IV BOLUS
1000.0000 mL | Freq: Once | INTRAVENOUS | Status: AC
  Administered 2021-08-03: 1000 mL via INTRAVENOUS

## 2021-08-03 MED ORDER — PANTOPRAZOLE SODIUM 40 MG IV SOLR
40.0000 mg | Freq: Once | INTRAVENOUS | Status: AC
  Administered 2021-08-03: 40 mg via INTRAVENOUS
  Filled 2021-08-03: qty 10

## 2021-08-03 NOTE — ED Triage Notes (Signed)
Patient has been vomiting for 2 days. She said yesterday she vomited blood. Today just cannot keep anything down. Does not know when her last period was, has PCOS, thinks last period was a year ago. ?

## 2021-08-03 NOTE — ED Provider Notes (Signed)
[facility] DEPT Provider Note   CSN: [id number] Arrival date & time: 08/03/21  2105     History  Chief Complaint  Patient presents with   Vomiting    Gabriela Sullivan is a 31 y.o. female.  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 She states she has been having progressive upper abdominal pain with early satiety for a few months. Pain often aggravated by eating. Yesterday, pain worsened in the evening and patient had large volume emesis which she reports contained only bright red blood. Had persistent abdominal pain today with emesis x 2. Noted small amount of BRB at the end of vomitus today; otherwise, emesis contained undigested food. She has not taken any medications for symptoms.  Patient also with an ocular headache. This has been worsening over the past week, but previously was intermittent for a number of months. No medications taken for headache. No modifying factors or recent head trauma. Has had intermittent blurry vision and tinnitus with headache, but none of these symptoms now. Denies facial or extremity numbness, facial drooping, extremity weakness, fevers.  The history is provided by the patient. No language interpreter was used.      Home Medications Prior to Admission medications   Medication Sig Start Date End Date Taking? Authorizing Provider  omeprazole (PRILOSEC) 20 MG capsule Take 1 capsule (20 mg total) by mouth daily. 08/04/21  Yes Antonietta Breach, PA-C  abemaciclib (VERZENIO) 150 MG tablet Take 1 tablet (150 mg total) by mouth 2 (two) times daily. Swallow tablets whole. Do not chew, crush, or split tablets before swallowing. 06/26/21   Nicholas Lose, MD  anastrozole (ARIMIDEX) 1 MG tablet Take 1 tablet (1 mg total) by mouth daily. 04/25/21   Nicholas Lose, MD  calcium carbonate (TUMS - DOSED IN MG ELEMENTAL CALCIUM) 500 MG chewable tablet Chew 2 tablets by mouth every other  day.    [provider]  cholecalciferol (VITAMIN D3) 25 MCG (1000 UNIT) tablet Take 1,000 Units by mouth daily.    [provider]  Goserelin Acetate (ZOLADEX [place]) Inject 1 Dose into the skin every 30 (thirty) days.    [provider]  loperamide (IMODIUM) 2 MG capsule Take by mouth as needed for diarrhea or loose stools.    Nicholas Lose, MD  ondansetron (ZOFRAN) 8 MG tablet Take by mouth every 8 (eight) hours as needed for nausea or vomiting.    Nicholas Lose, MD  potassium chloride SA (KLOR-CON M) 20 MEQ tablet Take 1 tablet (20 mEq total) by mouth 2 (two) times daily. 07/24/21   Nicholas Lose, MD  Semaglutide (OZEMPIC, 0.25 OR 0.5 MG/DOSE, [place]) Inject 0.25 mg into the skin once a week.    Alan Ripper, PA  venlafaxine XR (EFFEXOR-XR) 37.5 MG 24 hr capsule Take 1 capsule (37.5 mg total) by mouth daily with breakfast. Patient not taking: Reported on 05/29/2021 12/28/20   Magrinat, Virgie Dad, MD  Vitamin D, Ergocalciferol, (DRISDOL) 50000 UNITS CAPS capsule Take 50,000 Units by mouth every Monday.    [provider]  prochlorperazine (COMPAZINE) 10 MG tablet Take 1 tablet (10 mg total) by mouth every 6 (six) hours as needed (Nausea or vomiting). 10/04/20 12/21/20  Magrinat, Virgie Dad, MD      Allergies    Patient has no known allergies.    Review of Systems   Review of Systems Ten systems reviewed and are negative for acute change,  except as noted in the HPI.    Physical Exam Updated Vital Signs BP 125/72    Pulse 72    Temp (!) 97.5 F (36.4 C) (Oral)    Resp 18    Ht 5' (1.524 m)    Wt 78.9 kg    SpO2 97%    BMI 33.98 kg/m   Physical Exam Vitals and nursing note reviewed.  Constitutional:      General: She is not in acute distress.    Appearance: She is well-developed. She is not diaphoretic.     Comments: Nontoxic appearing, pleasant.  HENT:     Head: Normocephalic and atraumatic.     Right Ear: External ear normal.     Left Ear: External ear  normal.  Eyes:     General: No scleral icterus.    Conjunctiva/sclera: Conjunctivae normal.  Neck:     Comments: No nuchal rigidity or meningismus Cardiovascular:     Rate and Rhythm: Normal rate and regular rhythm.     Pulses: Normal pulses.  Pulmonary:     Effort: Pulmonary effort is normal. No respiratory distress.     Comments: Respirations even and unlabored. Abdominal:     Palpations: Abdomen is soft. There is no mass.     Tenderness: There is abdominal tenderness. There is no guarding.     Comments: Abdomen soft, obese. TTP in the epigastrium. Negative Murphy's sign. No palpable masses or peritoneal signs.  Musculoskeletal:        General: Normal range of motion.     Cervical back: Normal range of motion.  Skin:    General: Skin is warm and dry.     Coloration: Skin is not pale.     Findings: No erythema or rash.  Neurological:     Mental Status: She is alert and oriented to person, place, and time.     Cranial Nerves: No cranial nerve deficit.     Coordination: Coordination normal.     Comments: GCS 15.  Speech is clear, goal oriented.  She has no focal deficits on exam.  Moving all extremities spontaneously, symmetrically.  Psychiatric:        Behavior: Behavior normal.    ED Results / Procedures / Treatments   Labs (all labs ordered are listed, but only abnormal results are displayed) Labs Reviewed  CBC WITH DIFFERENTIAL/PLATELET - Abnormal; Notable for the following components:      Result Value   RBC 3.80 (*)    HCT 34.3 (*)    MCHC 36.7 (*)    RDW 17.0 (*)    All other components within normal limits  COMPREHENSIVE METABOLIC PANEL - Abnormal; Notable for the following components:   Total Protein 8.5 (*)    All other components within normal limits  URINALYSIS, ROUTINE W REFLEX MICROSCOPIC - Abnormal; Notable for the following components:   APPearance CLOUDY (*)    Leukocytes,Ua LARGE (*)    Bacteria, UA RARE (*)    All other components within normal  limits  URINE CULTURE  LIPASE, BLOOD  PROTIME-INR  PREGNANCY, URINE  I-STAT BETA HCG BLOOD, ED ([facility], [facility], [facility] ONLY)    EKG None  Radiology CT HEAD WO CONTRAST (5MM)  Result Date: 08/04/2021 CLINICAL DATA:  Worsening headaches, known history of breast carcinoma EXAM: CT HEAD WITHOUT CONTRAST TECHNIQUE: Contiguous axial images were obtained from the base of the skull through the vertex without intravenous contrast. RADIATION DOSE REDUCTION: This exam was performed according to the departmental dose-optimization  program which includes automated exposure control, adjustment of the mA and/or kV according to patient size and/or use of iterative reconstruction technique. COMPARISON:  None. FINDINGS: Brain: No evidence of acute infarction, hemorrhage, hydrocephalus, extra-axial collection or mass lesion/mass effect. Vascular: No hyperdense vessel or unexpected calcification. Skull: Normal. Negative for fracture or focal lesion. Sinuses/Orbits: No acute finding. Other: None. IMPRESSION: No acute intracranial abnormality noted. Electronically Signed   By: Inez Catalina M.D.   On: 08/04/2021 01:13   CT ABDOMEN PELVIS W CONTRAST  Result Date: 08/04/2021 CLINICAL DATA:  Acute abdominal pain, history of metastatic breast carcinoma EXAM: CT ABDOMEN AND PELVIS WITH CONTRAST TECHNIQUE: Multidetector CT imaging of the abdomen and pelvis was performed using the standard protocol following bolus administration of intravenous contrast. RADIATION DOSE REDUCTION: This exam was performed according to the departmental dose-optimization program which includes automated exposure control, adjustment of the mA and/or kV according to patient size and/or use of iterative reconstruction technique. CONTRAST:  133mL OMNIPAQUE IOHEXOL 300 MG/ML  SOLN COMPARISON:  05/16/2021 FINDINGS: Lower chest: No acute abnormality. Hepatobiliary: Fatty infiltration of the liver is noted. The gallbladder is well distended. No discrete hepatic mass is  noted. Pancreas: Unremarkable. No pancreatic ductal dilatation or surrounding inflammatory changes. Spleen: Normal in size without focal abnormality. Adrenals/Urinary Tract: Adrenal glands are within normal limits. Kidneys demonstrate a normal enhancement pattern bilaterally. No renal calculi or obstructive changes are seen. The bladder is partially distended. Stomach/Bowel: The appendix is well visualized and within normal limits. No obstructive or inflammatory changes of the colon are seen. Stomach and small bowel are within normal limits. Vascular/Lymphatic: No significant vascular findings are present. No enlarged abdominal or pelvic lymph nodes. Reproductive: Uterus and bilateral adnexa are unremarkable. Other: No abdominal wall hernia or abnormality. No abdominopelvic ascites. Musculoskeletal: No acute or significant osseous findings. Stable lucencies are noted within the bony structures to include the iliac bones bilaterally the right sacrum and right ischial tuberosity. A few scattered small lucencies are noted within the lumbar spine. IMPRESSION: Fatty liver Stable lucent lesions within the spine and pelvic bones similar to that seen on the prior exam. No new focal abnormality is noted. Electronically Signed   By: Inez Catalina M.D.   On: 08/04/2021 01:23    Procedures Procedures    Medications Ordered in ED Medications  sodium chloride 0.9 % bolus 1,000 mL (0 mLs Intravenous Stopped 08/04/21 0026)  pantoprazole (PROTONIX) injection 40 mg (40 mg Intravenous Given 08/03/21 2347)  iohexol (OMNIPAQUE) 300 MG/ML solution 100 mL (100 mLs Intravenous Contrast Given 08/04/21 0057)    ED Course/ Medical Decision Making/ A&P Clinical Course as of 08/04/21 0328  Sat Aug 04, 2021  0255 Patient declines rectal exam. [KH]  0256 States her headache resolved for a while. Has now returned, but is mild similar to her chronic headaches. [KH]  0256 Overall, reports she is feeling better. Reviewed test results,  imaging results. Patient verbalizes understanding. She does have pyuria on UA, but no urinary symptoms. Denies dysuria, hematuria, frequency/urgency. Will send UA for culture given immunosuppressed state on chemo infusions, but hold on initiation of abx at this time. [KH]    Clinical Course User Index [KH] Antonietta Breach, PA-C                           Medical Decision Making Amount and/or Complexity of Data Reviewed Labs: ordered. Radiology: ordered.  Risk Prescription drug management.   This patient presents  to the ED for concern of chronic abdominal pain and chronic headache, this involves an extensive number of treatment options, and is a complaint that carries with it a high risk of complications and morbidity.  The differential diagnosis for abdominal pain includes mass vs gastritis/esophagitis vs gallbladder etiology vs pancreatitis vs constipation vs ileus vs food-borne etiology vs viral illness. The differential diagnosis for headache includes migraine headache vs dehydration vs mass/tumor vs ICH vs IIH.   Co morbidities that complicate the patient evaluation  DCIS on chemo infusions PCOS   Additional history obtained:  External records from outside source obtained and reviewed including prior CT imaging from October 2022 related to cancer surveillance.    Lab Tests:  I Ordered, and personally interpreted labs.  The pertinent results include:  UA with pyuria; no bacteriuria, nitrites. Normal WBCs, stable and normal H/H. No electrolyte derangements. Liver and kidney function preserved. Normal coags.   Imaging Studies ordered:  I ordered imaging studies including CT head and CT abdomen/pelvis  I independently visualized and interpreted imaging which showed no acute intracranial or abdominopelvic process. Stable lytic lesions. I agree with the radiologist interpretation   Cardiac Monitoring:  The patient was maintained on a cardiac monitor.  I personally viewed and  interpreted the cardiac monitored which showed an underlying rhythm of: NSR   Medicines ordered and prescription drug management:  I ordered medication including Protonix and IVF for abdominal pain  Reevaluation of the patient after these medicines showed that the patient improved I have reviewed the patients home medicines and have made adjustments as needed   Test Considered:  Hemoccult - patient declines   Reevaluation:  After the interventions noted above, I reevaluated the patient and found that they have :improved   Social Determinants of Health:  Good social support; family at bedside Insured    Dispostion:  31 year old female with history of DCIS, currently undergoing chemotherapy infusions, presents for fairly recurrent and chronic complaints of headache and abdominal pain.  She does report some worsening of the symptoms yesterday which prompted her ED visit.  Also reports an episode of hematemesis yesterday without melena or hematochezia.  She had 2 episodes of vomiting today, but without evidence of recurrent hematemesis.  The patient's ED evaluation has been generally reassuring.  Given her cancer history, decision was made to proceed with head CT as well as CT of the abdomen and pelvis.  There is no evidence of cancer metastasis or new mass/lesion on either imaging modality.  No acute process identified to explain cause of patient's symptoms.  She has had some improvement in the ED with IV fluids as well as Protonix.  Question whether her episode of hematemesis may be related to gastritis/esophagitis versus Mallory-Weiss tear.  Recommended hemoccult to exclude GI bleed which patient declines.  Do not feel this is completely unreasonable as the patient has a normal BUN and stable H/H, reassuring vitals without tachycardia or hypotension.  No leukocytosis to suggest infectious process.  After consideration of the diagnostic results and the patients response to treatment,  I feel that the patent would benefit from initiation of outpatient course of Prilosec as well as close follow-up with her primary care doctor as well as her oncologist. Return precautions discussed and provided. Patient discharged in stable condition with no unaddressed concerns.         Final Clinical Impression(s) / ED Diagnoses Final diagnoses:  Pain of upper abdomen  Recurrent headache    Rx / DC Orders ED  Discharge Orders          Ordered    omeprazole (PRILOSEC) 20 MG capsule  Daily        08/04/21 0257              Antonietta Breach, PA-C 08/04/21 0333    Molpus, Jenny Reichmann, MD 08/04/21 [phone]

## 2021-08-03 NOTE — Telephone Encounter (Signed)
Received call from pt stating she experienced episode of bloody emesis today and requesting advice from MD.  Per MD pt needing to be evaluated in ED.  Pt educated to go to ED, pt verbalized understanding.  ?

## 2021-08-03 NOTE — ED Provider Triage Note (Signed)
Emergency Medicine Provider Triage Evaluation Note ? ?Gabriela Sullivan , a 31 y.o. female  was evaluated in triage.  Pt complains of HA, abd pain, N/V. States she has a migraine. No hx of similar, no recent head trauma, vision changes, unilateral weakness. No CP, SOB. Has some generalized abd pain. No dysuria. Last LMP > 1 year ago. States has PCOS and does not have regular cycles. Denies chance of pregnancy. Hx of breast cancer ? ?Review of Systems  ?Positive: HA, emesis, abd pain ?Negative: CP, SOB ? ?Physical Exam  ?BP (!) 151/103 (BP Location: Right Arm)   Pulse 78   Temp (!) 97.5 ?F (36.4 ?C) (Oral)   Resp 17   SpO2 100%  ?Gen:   Awake, no distress   ?Resp:  Normal effort  ?MSK:   Moves extremities without difficulty  ?Neuro:  Cn 2-12 grossly intact ?Abd:  Protuberant, diffuse tender ?Other:   ? ?Medical Decision Making  ?Medically screening exam initiated at 9:15 PM.  Appropriate orders placed.  Dusty Wagoner was informed that the remainder of the evaluation will be completed by another provider, this initial triage assessment does not replace that evaluation, and the importance of remaining in the ED until their evaluation is complete. ? ?HA, emesis, abd pain ?  ?Coston Mandato A, PA-C ?08/03/21 2118 ? ?

## 2021-08-04 ENCOUNTER — Emergency Department (HOSPITAL_COMMUNITY): Payer: Medicaid Other

## 2021-08-04 ENCOUNTER — Encounter (HOSPITAL_COMMUNITY): Payer: Self-pay

## 2021-08-04 LAB — I-STAT BETA HCG BLOOD, ED (MC, WL, AP ONLY): I-stat hCG, quantitative: <5 m[IU]/mL (ref <5)

## 2021-08-04 LAB — PREGNANCY, URINE: Preg Test, Ur: NEGATIVE

## 2021-08-04 IMAGING — CT CT ABD-PELV W/ CM
2 of 4 series · 16 of 46 positions shown, 18 images · IV contrast (agent unspecified)
Comparison: [DATE]

CLINICAL DATA: Acute abdominal pain, history of metastatic breast
carcinoma

EXAM:
CT ABDOMEN AND PELVIS WITH CONTRAST
TECHNIQUE: Multidetector CT imaging of the abdomen and pelvis was performed
using the standard protocol following bolus administration of
intravenous contrast.

[Series 2: axial st · axial · 0.87mm/px · z∈[-410,-35]mm · 13 of 87 slices shown, 15 images]
[im 6/87  soft-tissue]
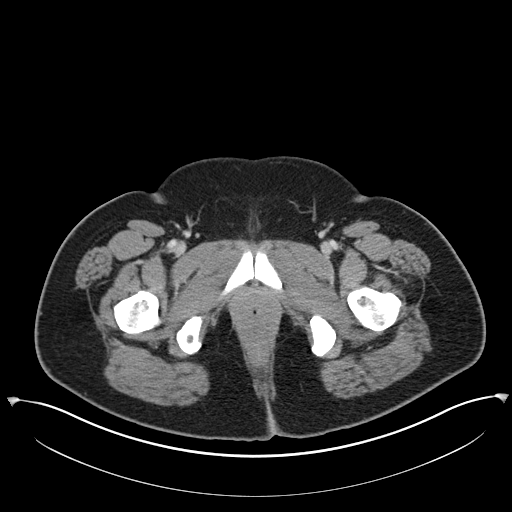
[im 6/87  bone]
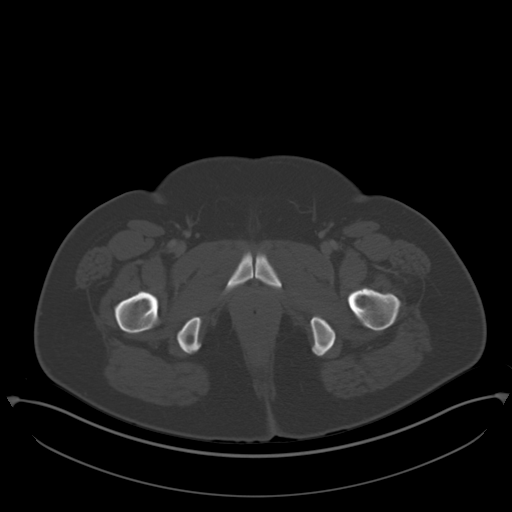
[im 11/87  soft-tissue]
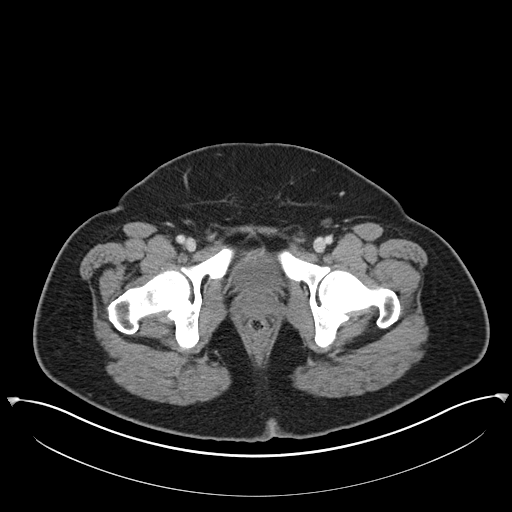
[im 21/87  soft-tissue]
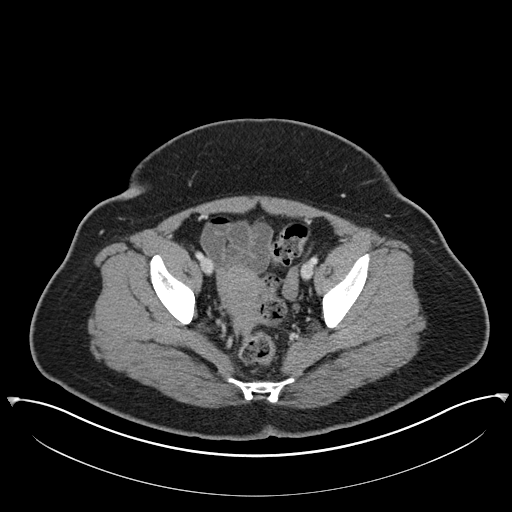
[im 26/87  soft-tissue]
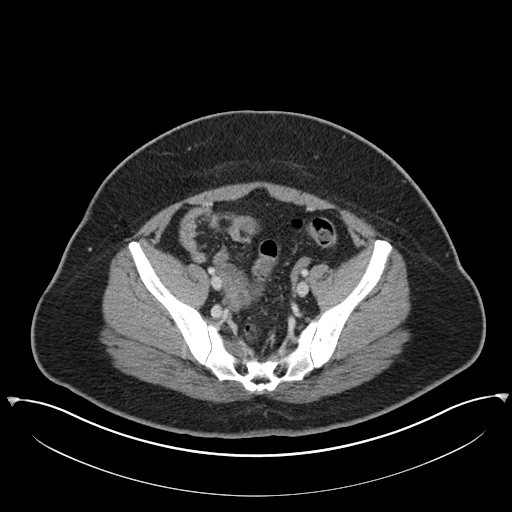
[im 31/87  soft-tissue]
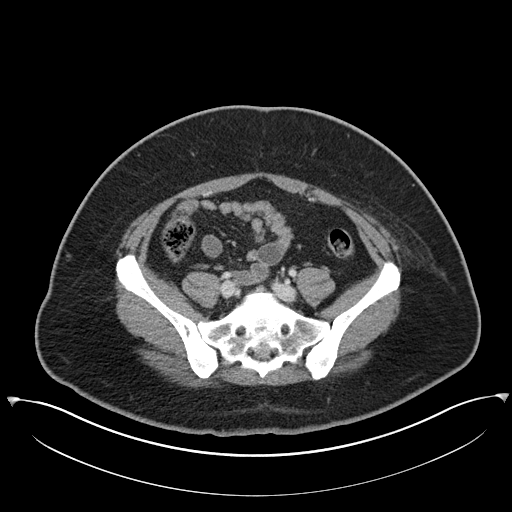
[im 36/87  soft-tissue]
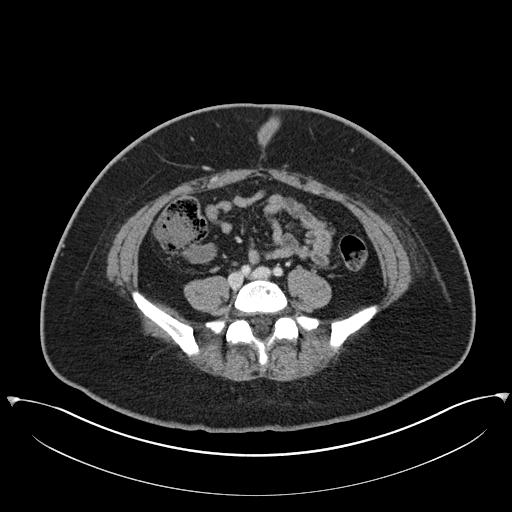
[im 46/87  soft-tissue]
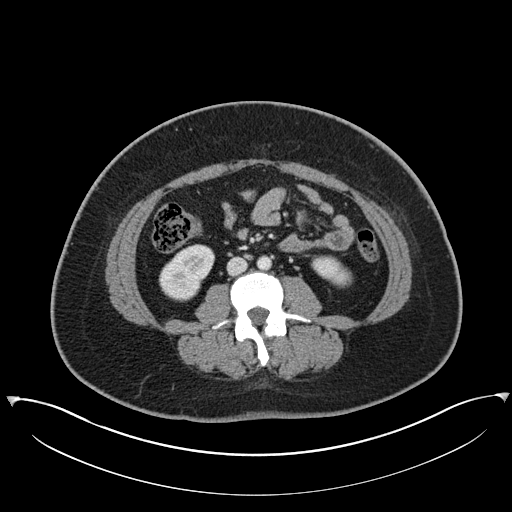
[im 51/87  soft-tissue]
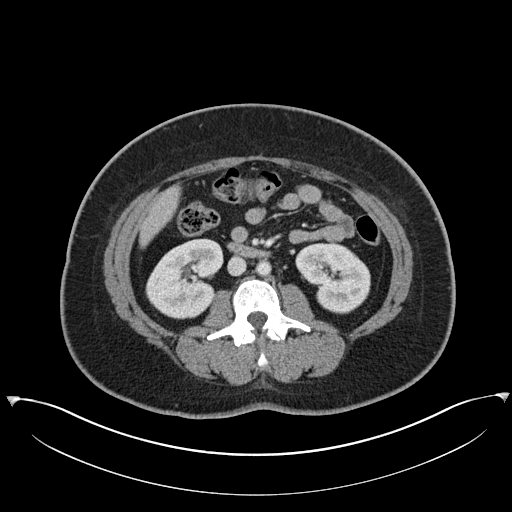
[im 56/87  soft-tissue]
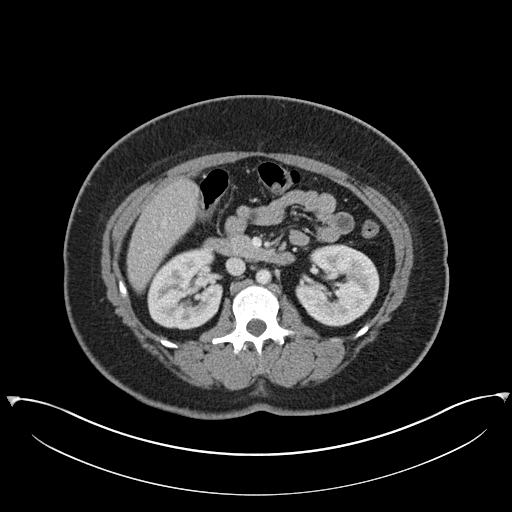
[im 56/87  bone]
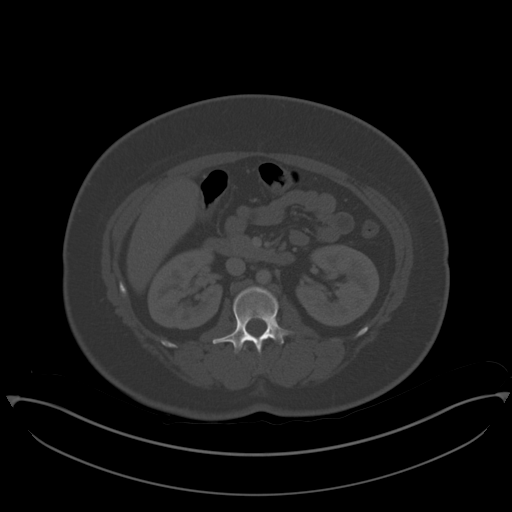
[im 61/87  soft-tissue]
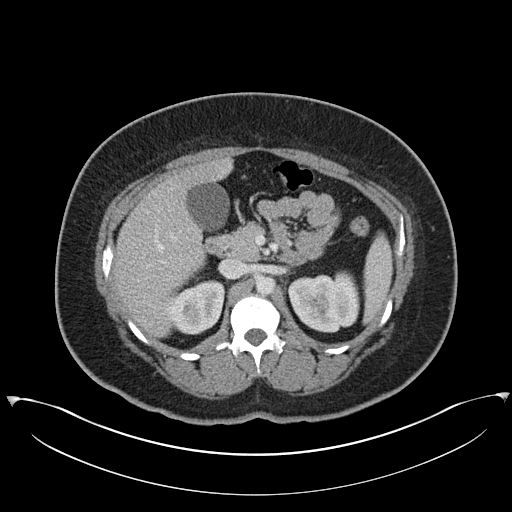
[im 66/87  soft-tissue]
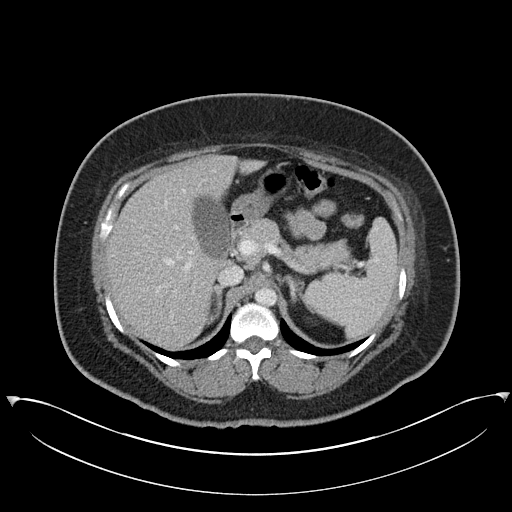
[im 76/87  soft-tissue]
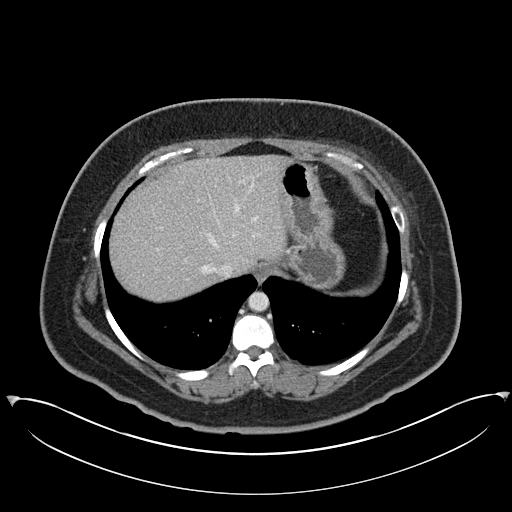
[im 81/87  soft-tissue]
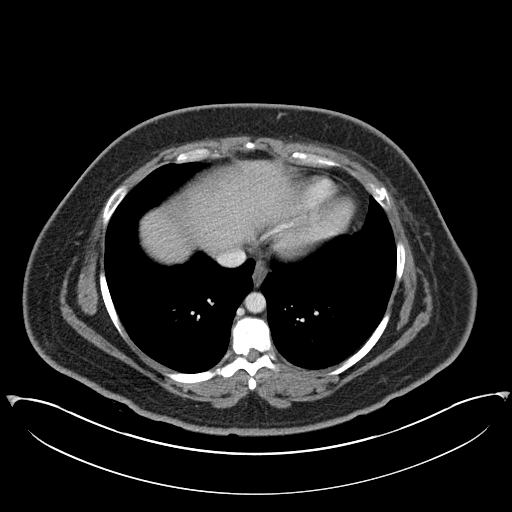

[Series 5: coronal st · coronal · 0.80mm/px · 3 of 156 slices shown]
[im 52/156  soft-tissue]
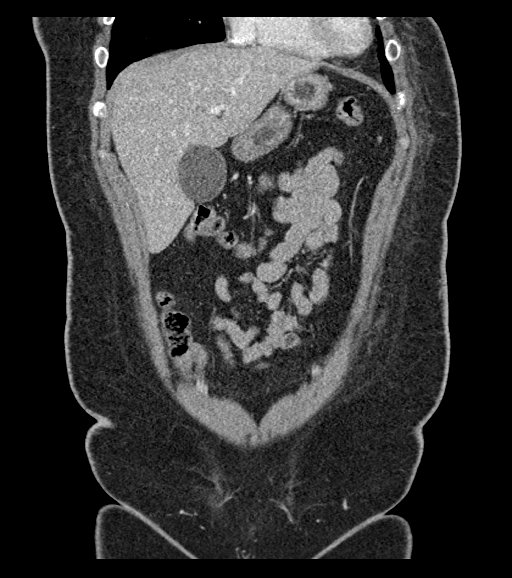
[im 69/156  soft-tissue]
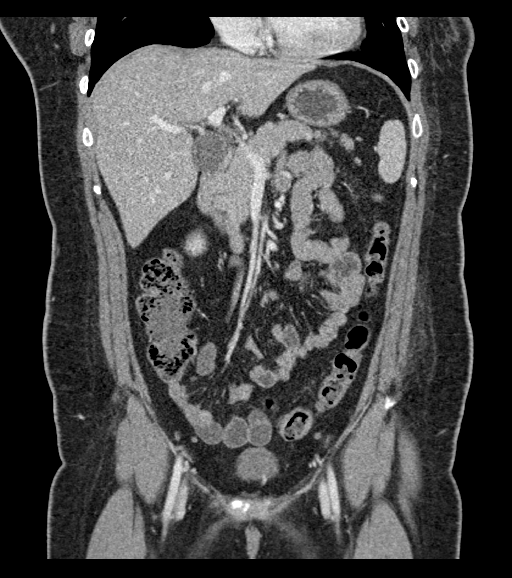
[im 87/156  soft-tissue]
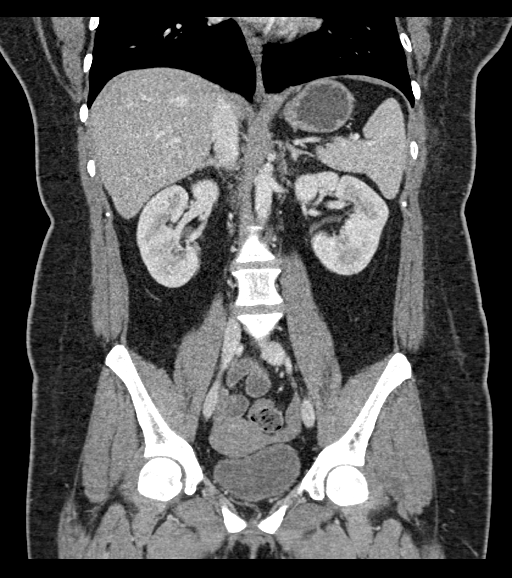

[16 of 46 positions shown; findings below may reference images not displayed]

RADIATION DOSE REDUCTION: This exam was performed according to the
departmental dose-optimization program which includes automated
exposure control, adjustment of the mA and/or kV according to
patient size and/or use of iterative reconstruction technique.

CONTRAST:  100mL OMNIPAQUE IOHEXOL 300 MG/ML  SOLN
FINDINGS: Lower chest: No acute abnormality.

Hepatobiliary: Fatty infiltration of the liver is noted. The
gallbladder is well distended. No discrete hepatic mass is noted.

Pancreas: Unremarkable. No pancreatic ductal dilatation or
surrounding inflammatory changes.

Spleen: Normal in size without focal abnormality.

Adrenals/Urinary Tract: Adrenal glands are within normal limits.
Kidneys demonstrate a normal enhancement pattern bilaterally. No
renal calculi or obstructive changes are seen. The bladder is
partially distended.

Stomach/Bowel: The appendix is well visualized and within normal
limits. No obstructive or inflammatory changes of the colon are
seen. Stomach and small bowel are within normal limits.

Vascular/Lymphatic: No significant vascular findings are present. No
enlarged abdominal or pelvic lymph nodes.

Reproductive: Uterus and bilateral adnexa are unremarkable.

Other: No abdominal wall hernia or abnormality. No abdominopelvic
ascites.

Musculoskeletal: No acute or significant osseous findings. Stable
lucencies are noted within the bony structures to include the iliac
bones bilaterally the right sacrum and right ischial tuberosity. A
few scattered small lucencies are noted within the lumbar spine.
IMPRESSION: Fatty liver

Stable lucent lesions within the spine and pelvic bones similar to
that seen on the prior exam. No new focal abnormality is noted.

## 2021-08-04 IMAGING — CT CT HEAD W/O CM
3 series · 16 of 47 positions shown, 19 images · non-contrast
Comparison: None.

CLINICAL DATA: Worsening headaches, known history of breast
carcinoma



[Series 2: head wo · axial · 0.42mm/px · z∈[-147,-17]mm · 10 of 32 slices shown, 13 images]
[im 3/32  brain]
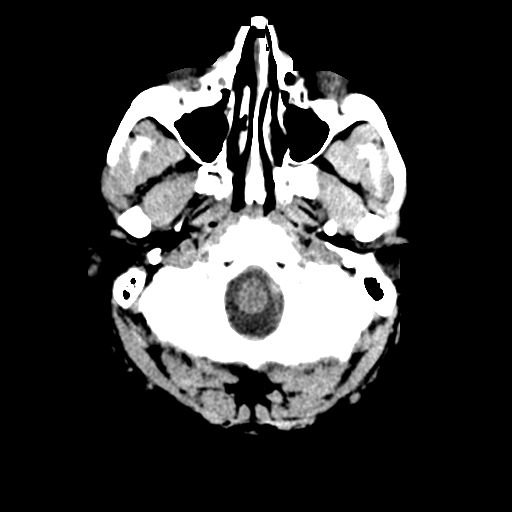
[im 3/32  bone]
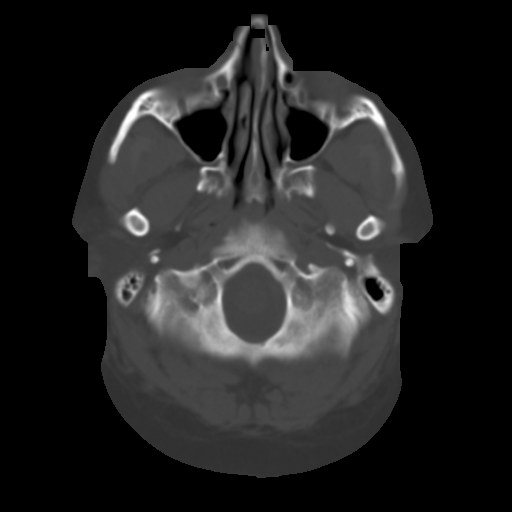
[im 6/32  brain]
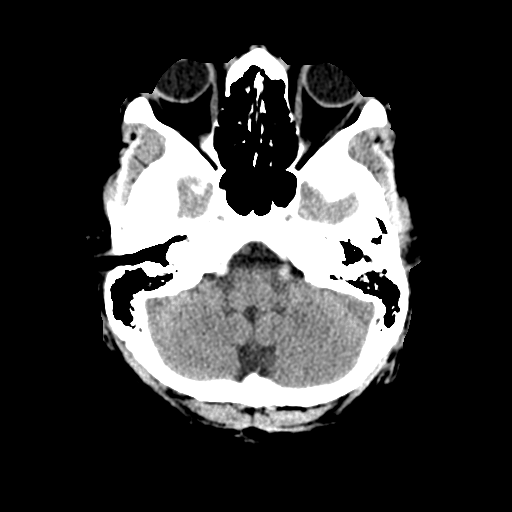
[im 9/32  brain]
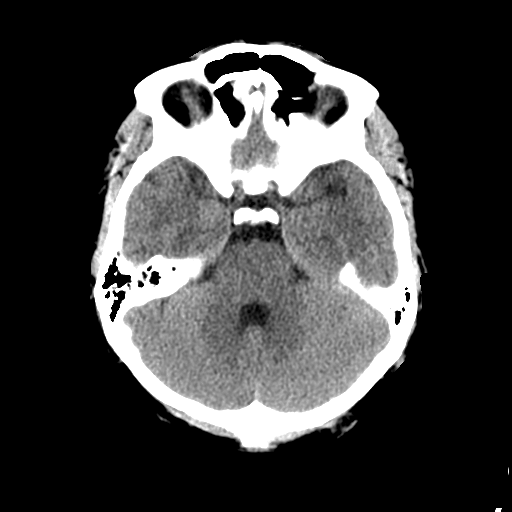
[im 11/32  brain]
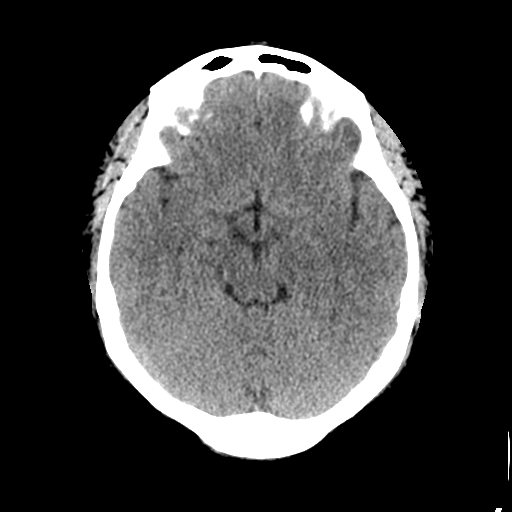
[im 14/32  brain]
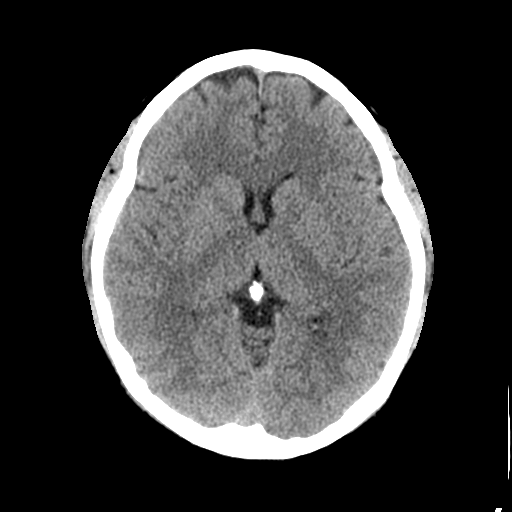
[im 14/32  bone]
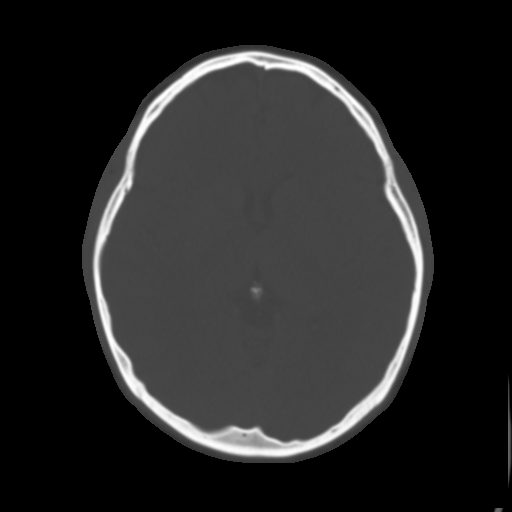
[im 18/32  brain]
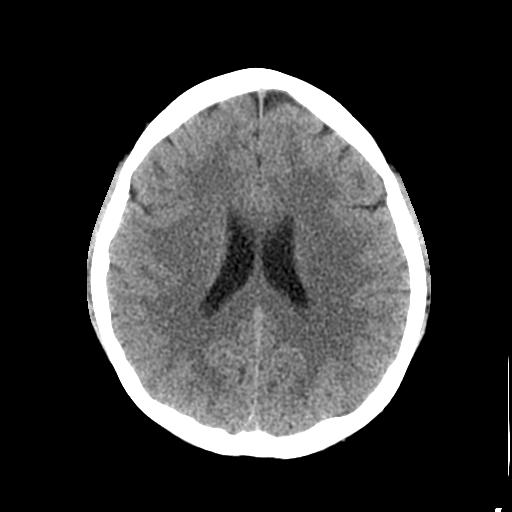
[im 21/32  brain]
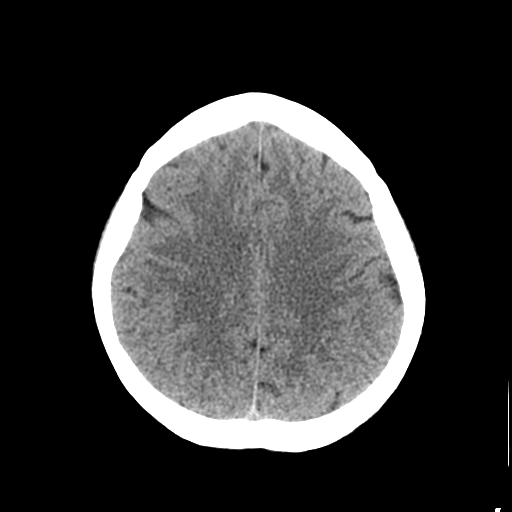
[im 24/32  brain]
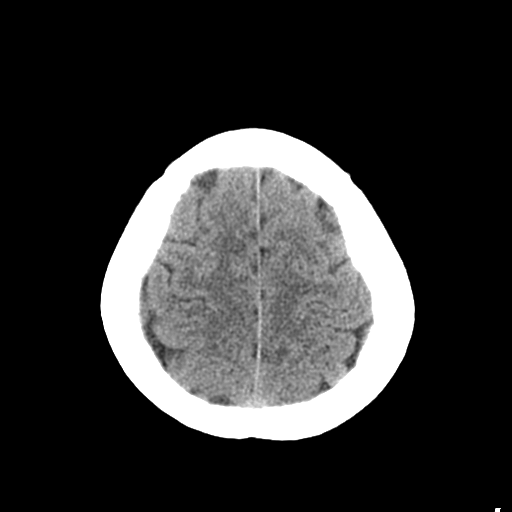
[im 26/32  brain]
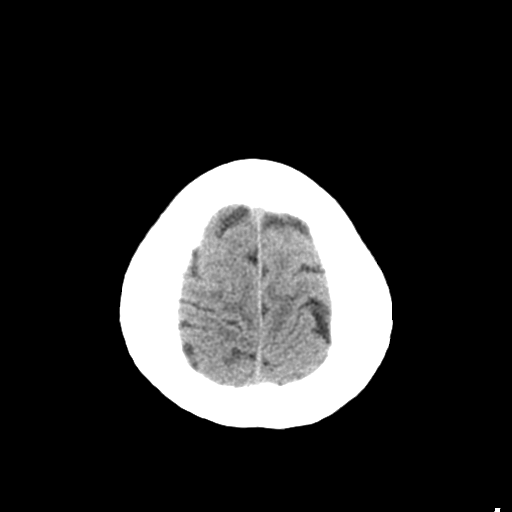
[im 26/32  bone]
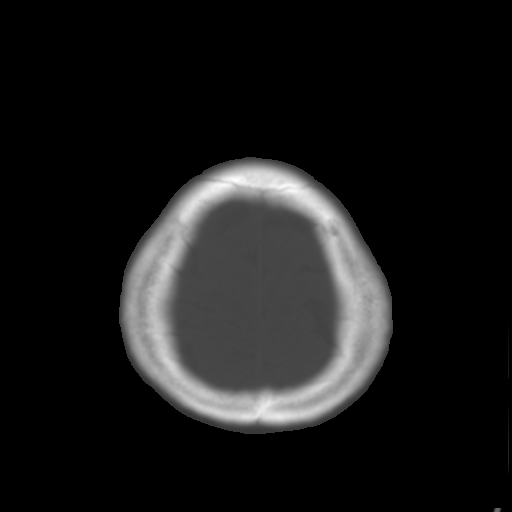
[im 29/32  brain]
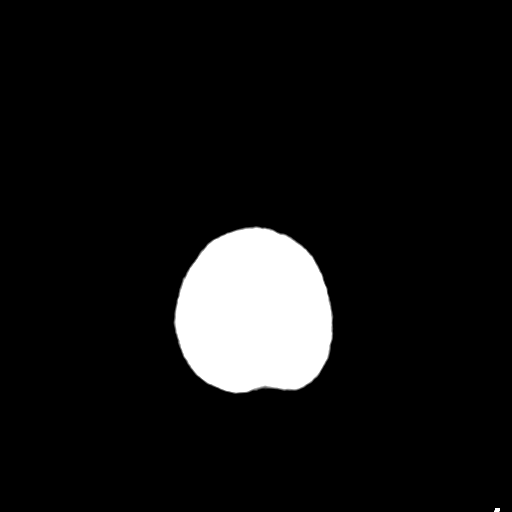

[Series 5: coronal soft tissue · coronal · 0.33mm/px · 3 of 72 slices shown]
[im 24/72  brain]
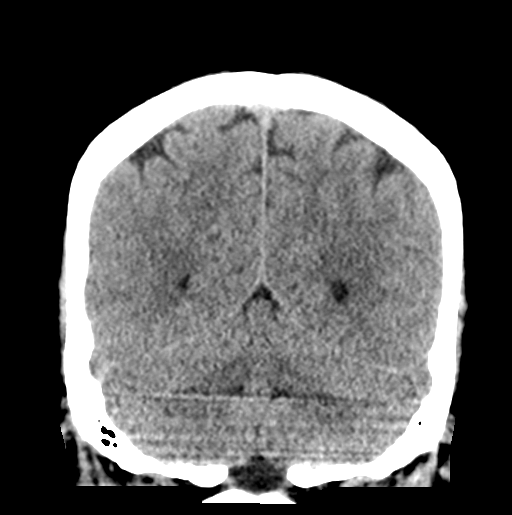
[im 32/72  brain]
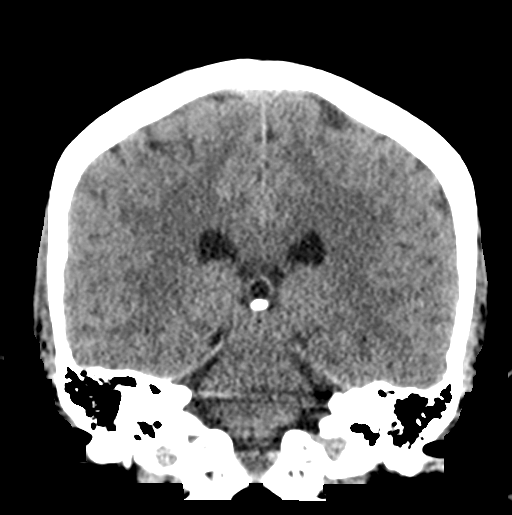
[im 40/72  brain]
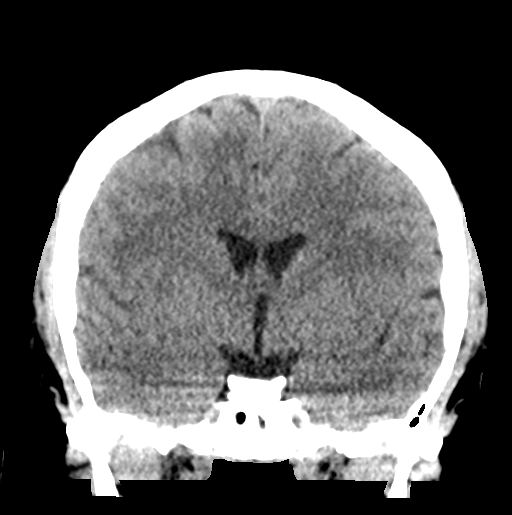

[Series 6: sagittal soft tissue · sagittal · 0.33mm/px · 3 of 57 slices shown]
[im 19/57  brain]
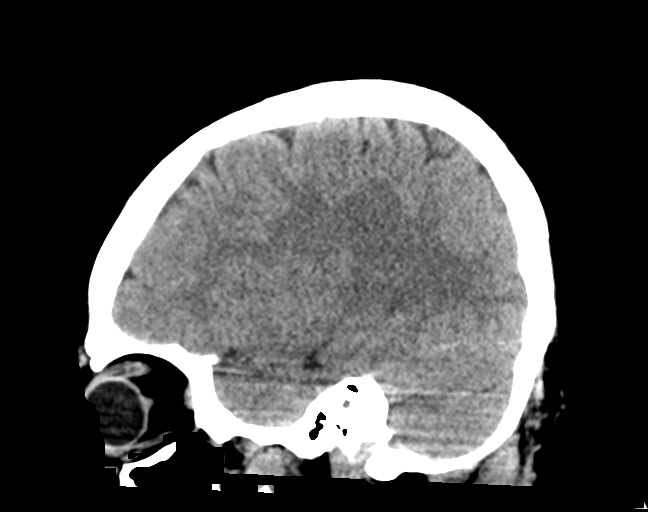
[im 29/57  brain]
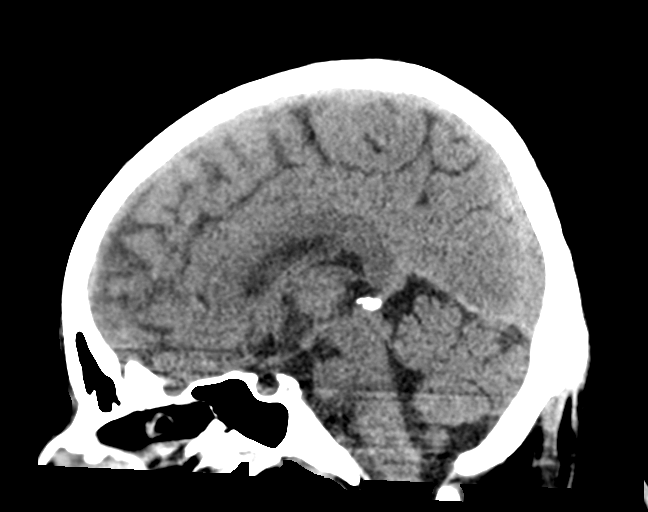
[im 38/57  brain]
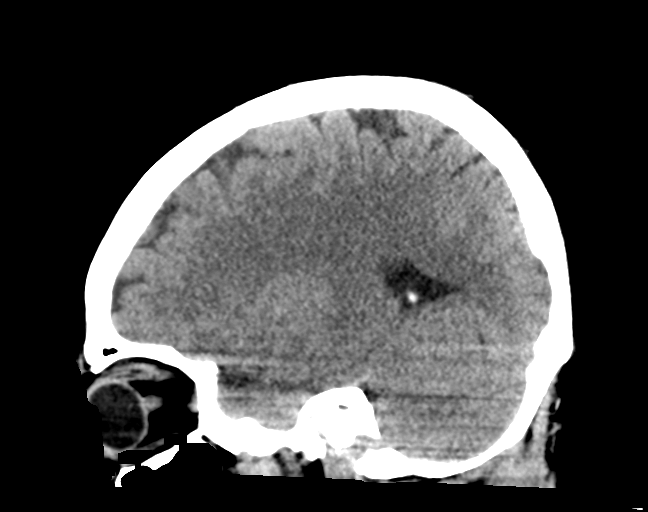

[16 of 47 positions shown; findings below may reference images not displayed]

FINDINGS: Brain: No evidence of acute infarction, hemorrhage, hydrocephalus,
extra-axial collection or mass lesion/mass effect.

Vascular: No hyperdense vessel or unexpected calcification.

Skull: Normal. Negative for fracture or focal lesion.

Sinuses/Orbits: No acute finding.

Other: None.
IMPRESSION: No acute intracranial abnormality noted.

## 2021-08-04 MED ORDER — OMEPRAZOLE 20 MG PO CPDR: 20.0000 mg | DELAYED_RELEASE_CAPSULE | Freq: Every day | ORAL | 0 refills | Status: DC

## 2021-08-04 MED ORDER — IOHEXOL 300 MG/ML  SOLN
100.0000 mL | Freq: Once | INTRAMUSCULAR | Status: AC | PRN
  Administered 2021-08-04: 100 mL via INTRAVENOUS

## 2021-08-04 NOTE — Discharge Instructions (Addendum)
Your work up in the ED has been reassuring. We recommend daily Prilosec, as prescribed. Take this with your daily medications. Follow up with your primary care doctor as well as your oncologist to discuss your ongoing pain complaints. Return for new or concerning symptoms. ?

## 2021-08-14 ENCOUNTER — Ambulatory Visit (HOSPITAL_COMMUNITY)
Admission: RE | Admit: 2021-08-14 | Discharge: 2021-08-14 | Disposition: A | Discharge status: Home / Self Care | Payer: Medicaid Other | Source: Ambulatory Visit | Attending: Hematology and Oncology | Admitting: Hematology and Oncology

## 2021-08-14 ENCOUNTER — Other Ambulatory Visit: Payer: Self-pay

## 2021-08-14 ENCOUNTER — Other Ambulatory Visit (HOSPITAL_COMMUNITY): Payer: Self-pay

## 2021-08-14 DIAGNOSIS — C50412 Malignant neoplasm of upper-outer quadrant of left female breast: Secondary | ICD-10-CM | POA: Diagnosis present

## 2021-08-14 DIAGNOSIS — Z170 Estrogen receptor positive status [ER+]: Secondary | ICD-10-CM | POA: Insufficient documentation

## 2021-08-14 IMAGING — NM NM BONE WHOLE BODY
2 series · 2 of 2 positions shown · non-contrast
Comparison: Nuclear medicine bone scan [DATE] and CT [DATE]

CLINICAL DATA: History of stage IV invasive breast carcinoma.
Evaluate for metastatic disease.

EXAM:
NUCLEAR MEDICINE WHOLE BODY BONE SCAN
TECHNIQUE: Whole body anterior and posterior images were obtained approximately
3 hours after intravenous injection of radiopharmaceutical.
RADIOPHARMACEUTICALS:  [DATE] mCi [FF] MDP IV

[Series 1: wbr_bone_40 whole body · 2.66mm/px · 1 of 1 slices shown (1 of 2)]
[im 1/1]
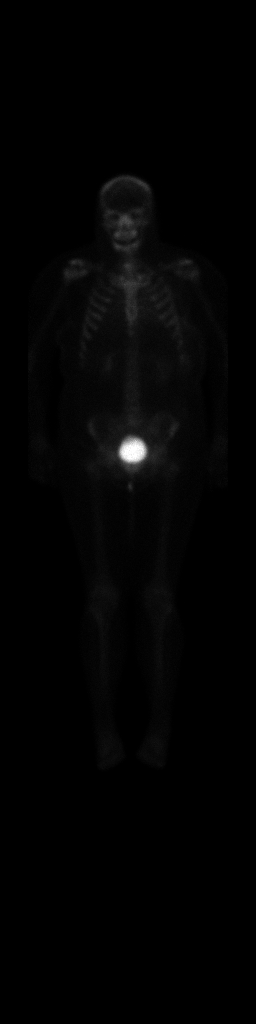

[Series 1: wbr_bone_40 whole body · 2.66mm/px · 1 of 1 slices shown (2 of 2)]
[im 1/1]
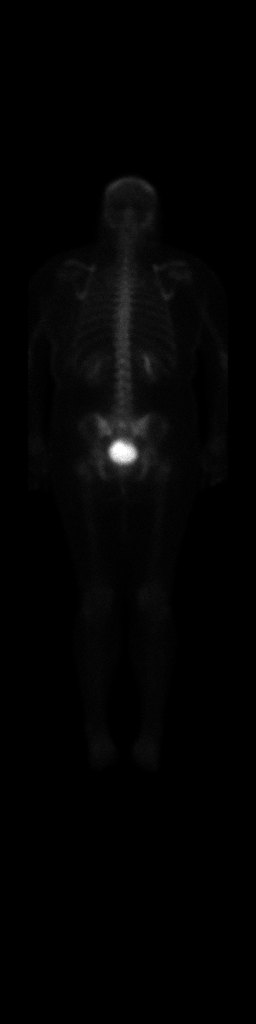

[2 of 2 positions shown; findings below may reference images not displayed]

FINDINGS: No abnormal focus of increased radiotracer uptake on today's study.
There is normal physiologic tracer uptake identified within both
kidneys an urinary bladder.
IMPRESSION: Stable exam. No abnormal foci of increased uptake identified on
today's study to correspond with known bone metastases.

## 2021-08-14 IMAGING — CT CT CHEST-ABD-PELV W/ CM
2 of 4 series · 13 of 36 positions shown, 15 images · IV contrast (APPLIED)
Comparison: Abdomen/pelvis CT [DATE]. Chest abdomen pelvis CT
[DATE].

CLINICAL DATA: Stage IV breast cancer.  Restaging.

EXAM:
CT CHEST, ABDOMEN, AND PELVIS WITH CONTRAST
TECHNIQUE: Multidetector CT imaging of the chest, abdomen and pelvis was
performed following the standard protocol during bolus
administration of intravenous contrast.

[Series 2: cap with · axial · 0.82mm/px · z∈[-487,+28]mm · 10 of 125 slices shown, 12 images]
[im 11/125  mediastinal]
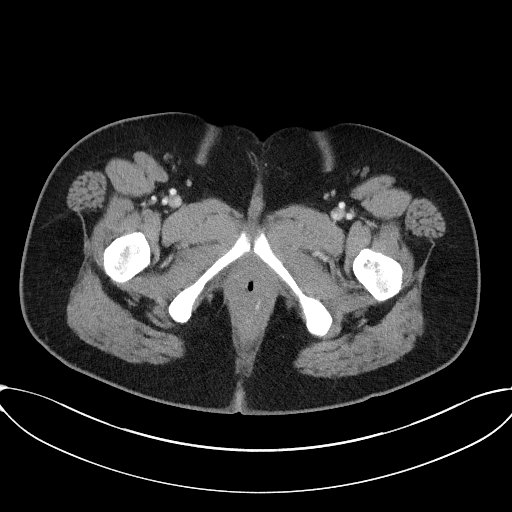
[im 11/125  bone]
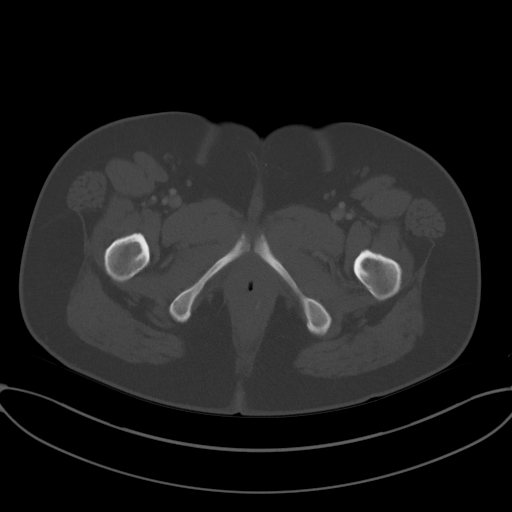
[im 21/125  mediastinal]
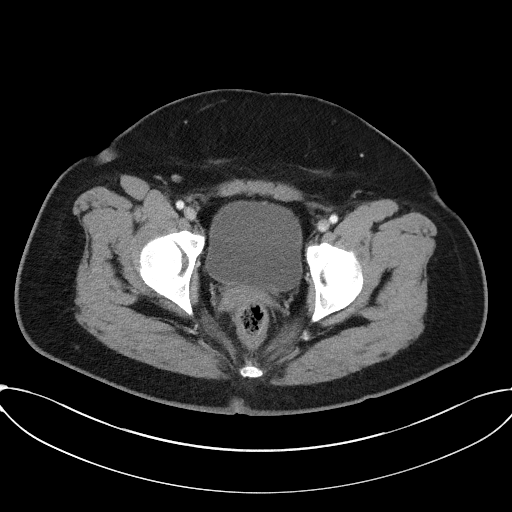
[im 32/125  mediastinal]
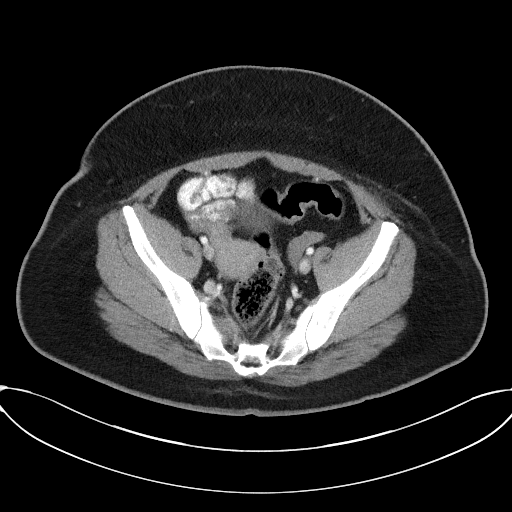
[im 42/125  mediastinal]
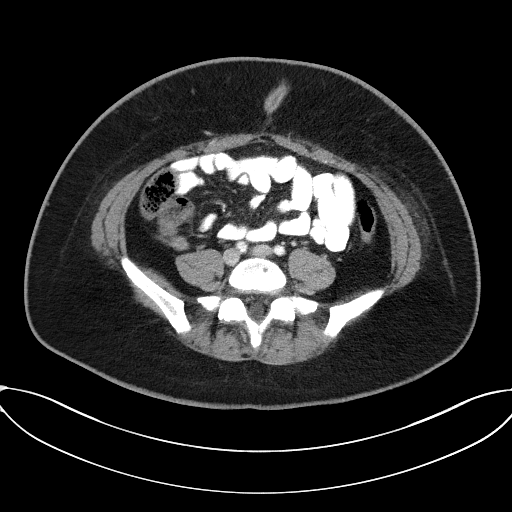
[im 52/125  mediastinal]
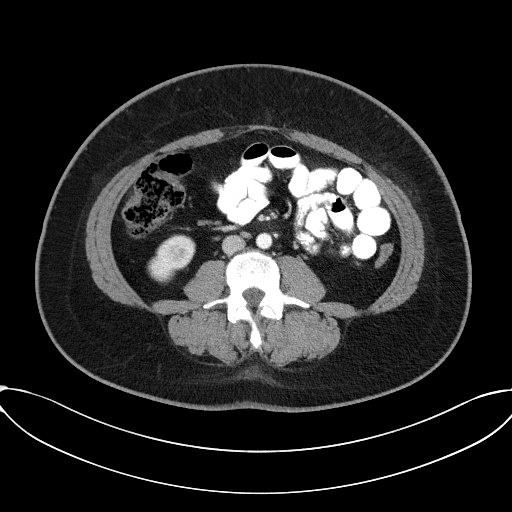
[im 73/125  mediastinal]
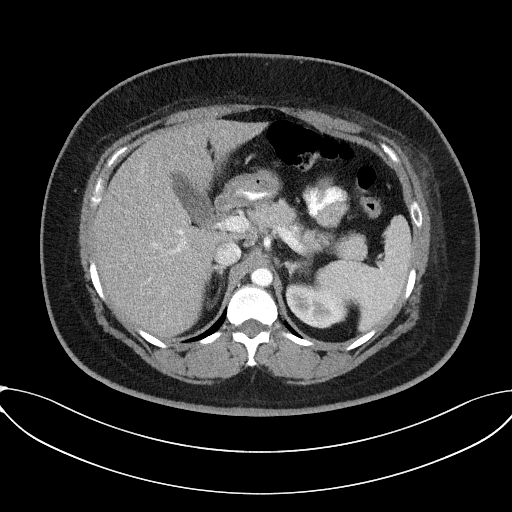
[im 83/125  mediastinal]
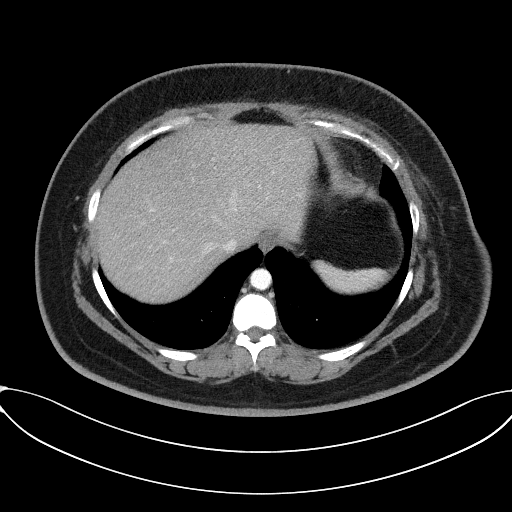
[im 94/125  mediastinal]
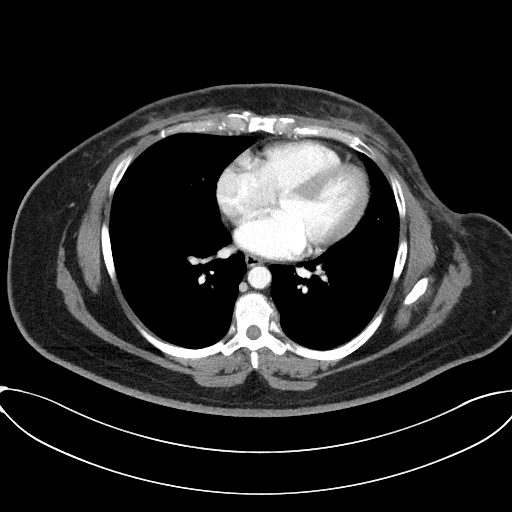
[im 104/125  mediastinal]
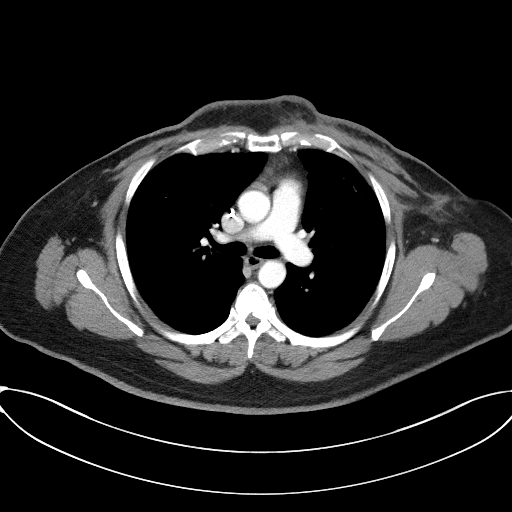
[im 104/125  bone]
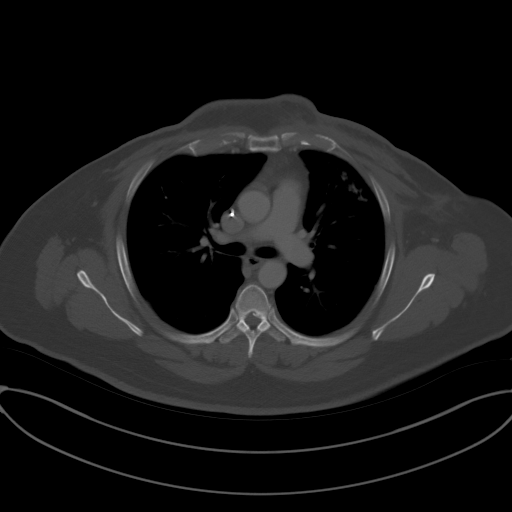
[im 114/125  mediastinal]
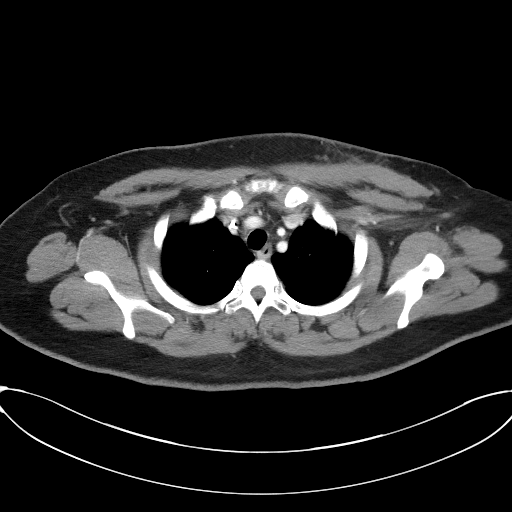

[Series 5: coronals · coronal · 0.74mm/px · 3 of 149 slices shown]
[im 30/149  mediastinal]
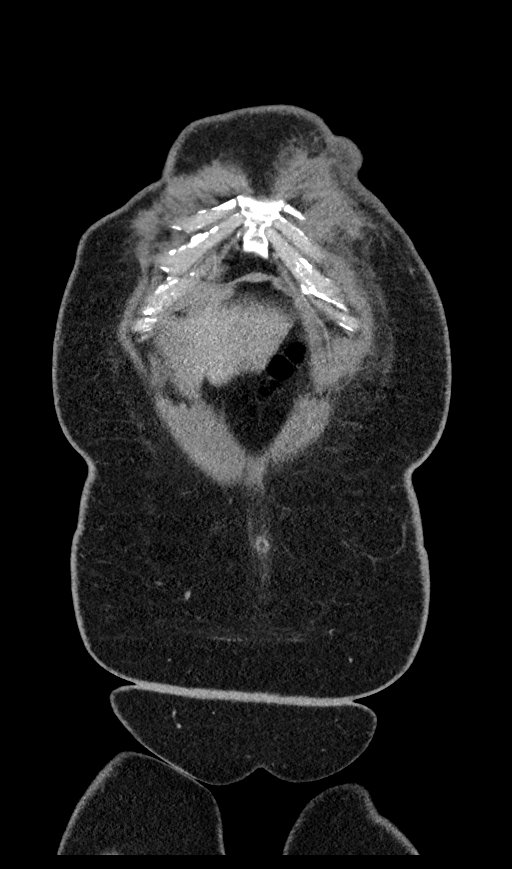
[im 60/149  mediastinal]
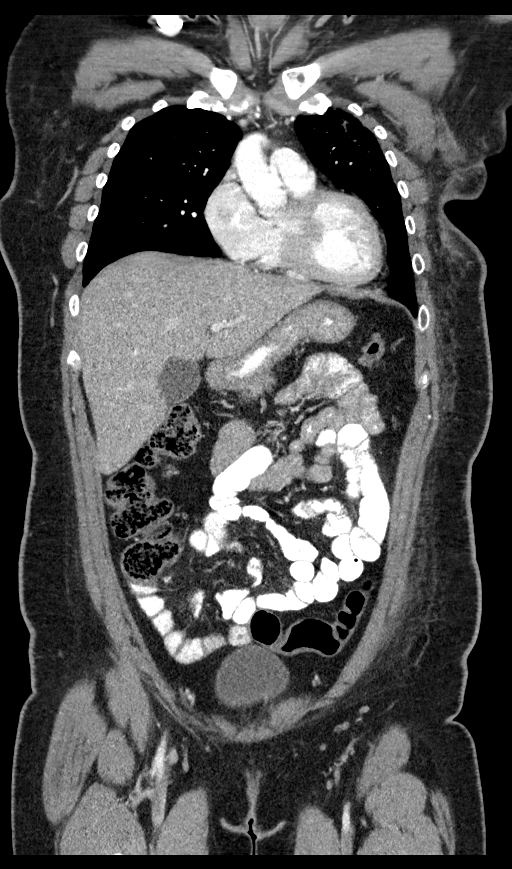
[im 89/149  mediastinal]
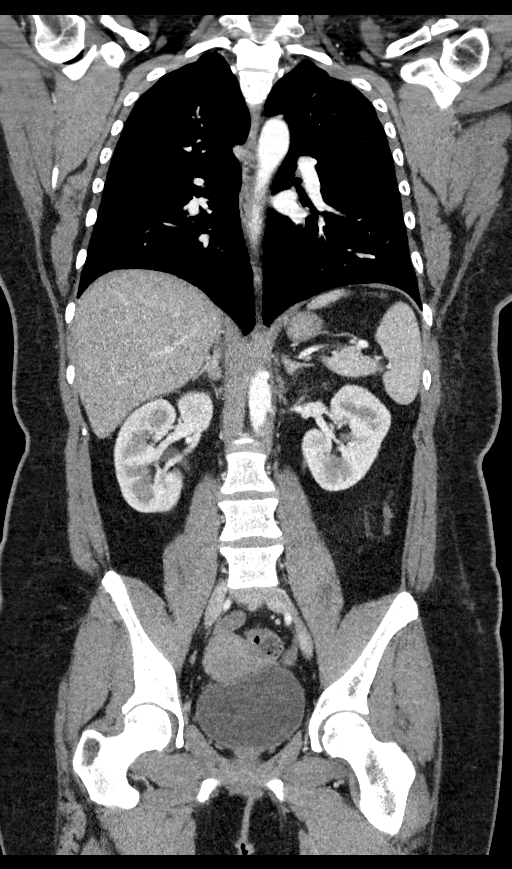

[13 of 36 positions shown; findings below may reference images not displayed]

RADIATION DOSE REDUCTION: This exam was performed according to the
departmental dose-optimization program which includes automated
exposure control, adjustment of the mA and/or kV according to
patient size and/or use of iterative reconstruction technique.

CONTRAST:  100mL OMNIPAQUE IOHEXOL 300 MG/ML  SOLN
FINDINGS: CT CHEST FINDINGS

Cardiovascular: The heart size is normal. No substantial pericardial
effusion. No thoracic aortic aneurysm. Right Port-A-Cath tip is
positioned in the upper right atrium.

Mediastinum/Nodes: No mediastinal lymphadenopathy. There is no hilar
lymphadenopathy. The esophagus has normal imaging features. There is
no axillary lymphadenopathy. Prominent left axillary lymph node seen
on the [DATE] exam has decreased in the interval measuring only
5 mm short axis today on image [DATE].

Lungs/Pleura: Bandlike irregular subpleural opacity in the anterior
left upper lobe is most suggestive of scarring from radiation
therapy. Otherwise, no new suspicious pulmonary nodule or mass. No
focal airspace consolidation. No pleural effusion.

Musculoskeletal: Stable tiny lucent lesion in the T11 vertebral
body.

CT ABDOMEN PELVIS FINDINGS

Hepatobiliary: No suspicious focal abnormality within the liver
parenchyma. There is no evidence for gallstones, gallbladder wall
thickening, or pericholecystic fluid. No intrahepatic or
extrahepatic biliary dilation.

Pancreas: No focal mass lesion. No dilatation of the main duct. No
intraparenchymal cyst. No peripancreatic edema.

Spleen: No splenomegaly. No focal mass lesion.

Adrenals/Urinary Tract: No adrenal nodule or mass. Kidneys
unremarkable. No evidence for hydroureter. The urinary bladder
appears normal for the degree of distention.

Stomach/Bowel: Stomach is unremarkable. No gastric wall thickening.
No evidence of outlet obstruction. Duodenum is normally positioned
as is the ligament of Treitz. No small bowel wall thickening. No
small bowel dilatation. The terminal ileum is normal. The appendix
is normal. No gross colonic mass. No colonic wall thickening.

Vascular/Lymphatic: No abdominal aortic aneurysm. There is no
gastrohepatic or hepatoduodenal ligament lymphadenopathy. No
retroperitoneal or mesenteric lymphadenopathy. No pelvic sidewall
lymphadenopathy.

Reproductive: The uterus is unremarkable.  There is no adnexal mass.

Other: No intraperitoneal free fluid.

Musculoskeletal: Tiny lucent lesion in the L2 vertebral body is
stable. Small lucency right iliac bone along the SI joint is
unchanged in the interval. Next lytic and sclerotic density in the
right sacrum is unchanged and the small lucent lesion in the
posterior left iliac bone is stable. 14 mm lucency in the S2
vertebral body is stable.
IMPRESSION: 1. No new or progressive findings in the chest, abdomen, or pelvis
to suggest progressive disease.
2. Prominent left axillary lymph node seen on the [DATE] exam
has decreased in the interval.
3. Stable appearance of the scattered lucent bone lesions in the
thoracolumbar spine and bony pelvis.

## 2021-08-14 MED ORDER — HEPARIN SOD (PORK) LOCK FLUSH 100 UNIT/ML IV SOLN
INTRAVENOUS | Status: AC
  Administered 2021-08-14: 500 [IU] via INTRAVENOUS
  Filled 2021-08-14: qty 5

## 2021-08-14 MED ORDER — SODIUM CHLORIDE (PF) 0.9 % IJ SOLN
INTRAMUSCULAR | Status: AC
  Filled 2021-08-14: qty 50

## 2021-08-14 MED ORDER — IOHEXOL 300 MG/ML  SOLN
100.0000 mL | Freq: Once | INTRAMUSCULAR | Status: AC | PRN
  Administered 2021-08-14: 100 mL via INTRAVENOUS

## 2021-08-14 MED ORDER — TECHNETIUM TC 99M MEDRONATE IV KIT
20.7000 | PACK | Freq: Once | INTRAVENOUS | Status: AC
  Administered 2021-08-14: 20.7 via INTRAVENOUS

## 2021-08-14 MED ORDER — HEPARIN SOD (PORK) LOCK FLUSH 100 UNIT/ML IV SOLN: 500.0000 [IU] | Freq: Once | INTRAVENOUS | Status: AC

## 2021-08-15 ENCOUNTER — Other Ambulatory Visit (HOSPITAL_COMMUNITY): Payer: Self-pay

## 2021-08-20 ENCOUNTER — Other Ambulatory Visit: Payer: Self-pay | Admitting: *Deleted

## 2021-08-20 DIAGNOSIS — Z170 Estrogen receptor positive status [ER+]: Secondary | ICD-10-CM

## 2021-08-21 ENCOUNTER — Other Ambulatory Visit: Payer: Self-pay

## 2021-08-21 ENCOUNTER — Inpatient Hospital Stay: Payer: Medicaid Other | Attending: Oncology

## 2021-08-21 ENCOUNTER — Inpatient Hospital Stay (HOSPITAL_BASED_OUTPATIENT_CLINIC_OR_DEPARTMENT_OTHER): Payer: Medicaid Other | Admitting: Hematology and Oncology

## 2021-08-21 ENCOUNTER — Inpatient Hospital Stay: Payer: Medicaid Other

## 2021-08-21 DIAGNOSIS — Z95828 Presence of other vascular implants and grafts: Secondary | ICD-10-CM

## 2021-08-21 DIAGNOSIS — Z170 Estrogen receptor positive status [ER+]: Secondary | ICD-10-CM

## 2021-08-21 DIAGNOSIS — Z5111 Encounter for antineoplastic chemotherapy: Secondary | ICD-10-CM | POA: Insufficient documentation

## 2021-08-21 DIAGNOSIS — C50412 Malignant neoplasm of upper-outer quadrant of left female breast: Secondary | ICD-10-CM | POA: Diagnosis present

## 2021-08-21 DIAGNOSIS — C7951 Secondary malignant neoplasm of bone: Secondary | ICD-10-CM | POA: Diagnosis not present

## 2021-08-21 LAB — CBC WITH DIFFERENTIAL (CANCER CENTER ONLY)
Abs Immature Granulocytes: 0 10*3/uL (ref 0.00–0.07)
Basophils Absolute: 0 10*3/uL (ref 0.0–0.1)
Basophils Relative: 2 %
Eosinophils Absolute: 0.1 10*3/uL (ref 0.0–0.5)
Eosinophils Relative: 2 %
HCT: 30.8 % — ABNORMAL LOW (ref 36.0–46.0)
Hemoglobin: 11.2 g/dL — ABNORMAL LOW (ref 12.0–15.0)
Immature Granulocytes: 0 %
Lymphocytes Relative: 30 %
Lymphs Abs: 0.8 10*3/uL (ref 0.7–4.0)
MCH: 33.5 pg (ref 26.0–34.0)
MCHC: 36.4 g/dL — ABNORMAL HIGH (ref 30.0–36.0)
MCV: 92.2 fL (ref 80.0–100.0)
Monocytes Absolute: 0.1 10*3/uL (ref 0.1–1.0)
Monocytes Relative: 5 %
Neutro Abs: 1.7 10*3/uL (ref 1.7–7.7)
Neutrophils Relative %: 61 %
Platelet Count: 255 10*3/uL (ref 150–400)
RBC: 3.34 MIL/uL — ABNORMAL LOW (ref 3.87–5.11)
RDW: 15.3 % (ref 11.5–15.5)
WBC Count: 2.7 10*3/uL — ABNORMAL LOW (ref 4.0–10.5)
nRBC: 0 % (ref 0.0–0.2)

## 2021-08-21 LAB — CMP (CANCER CENTER ONLY)
ALT: 28 U/L (ref 0–44)
AST: 24 U/L (ref 15–41)
Albumin: 4.2 g/dL (ref 3.5–5.0)
Alkaline Phosphatase: 36 U/L — ABNORMAL LOW (ref 38–126)
Anion gap: 5 (ref 5–15)
BUN: 13 mg/dL (ref 6–20)
CO2: 26 mmol/L (ref 22–32)
Calcium: 9.2 mg/dL (ref 8.9–10.3)
Chloride: 109 mmol/L (ref 98–111)
Creatinine: 0.53 mg/dL (ref 0.44–1.00)
GFR, Estimated: >60 mL/min (ref >60)
Glucose, Bld: 92 mg/dL (ref 70–99)
Potassium: 3.8 mmol/L (ref 3.5–5.1)
Sodium: 140 mmol/L (ref 135–145)
Total Bilirubin: 0.7 mg/dL (ref 0.3–1.2)
Total Protein: 7.2 g/dL (ref 6.5–8.1)

## 2021-08-21 MED ORDER — GOSERELIN ACETATE 3.6 MG ~~LOC~~ IMPL
3.6000 mg | DRUG_IMPLANT | Freq: Once | SUBCUTANEOUS | Status: AC
  Administered 2021-08-21: 3.6 mg via SUBCUTANEOUS
  Filled 2021-08-21: qty 3.6

## 2021-08-21 NOTE — Assessment & Plan Note (Addendum)
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% ?09/25/2020: Right breast biopsy: DCIS grade 1, ER/PR positive ?10/06/2020 ?breast MRI showed a 11 cm area of malignancy, with a second 1.9 cm mass, and skin thickening concerning for inflammatory breast carcinoma; at least 6 abnormal lymph nodes noted ?10/13/2020 CT chest shows clear lungs and no obvious liver involvement but multiple lytic bone lesions in addition to the findings and MRI above ?10/21/2020 spine MRI: Shows widely scattered thoracic and lumbar vertebral body metastases ?10/24/2020: 2 cycles of dose dense Adriamycin and Cytoxan discontinued because of rising LFTs ?12/01/2020: MRI abdomen: No evidence of metastatic disease in the liver ?01/31/2021:Left mastectomy: Residual multifocal IDC 2 cm with intermediate grade DCIS, 15/15 lymph nodes positive, margins negative, lymphovascular space invasion present, ER 60%, PR 10%, HER2 negative, Ki-67 15% ?Right mastectomy: Fibrocystic change no residual cancer or DCIS ?? ?Current treatment: Anastrozole with goserelin and Zometa and Verzenio ?? ?Anastrozole toxicities: Tolerating it very well ?Abemaciclib toxicities: Very occasional loose stools ? ? ?08/14/2021: CT CAP and bone scan: Stable bone metastases ?Patient has an appointment next month with John. ?I will see her in May along with her Zometa infusion with labs.  After that she could be seen once every 3 months. ?We plan to do scans every 6 months. ?

## 2021-08-21 NOTE — Progress Notes (Signed)
? ?Patient Care Team: ?[name], MD as PCP - General (Family Medicine) ?Rockwell Germany, RN as Oncology Nurse Navigator ?Mauro Kaufmann, RN as Oncology Nurse Navigator ?Erroll Luna, MD as Consulting Physician (General Surgery) ?Kyung Rudd, MD as Consulting Physician (Radiation Oncology) ?Nicholas Lose, MD as Consulting Physician (Hematology and Oncology) ? ?DIAGNOSIS:  ?Encounter Diagnosis  ?Name Primary?  ? Malignant neoplasm of upper-outer quadrant of left breast in female, estrogen receptor positive ([place])   ? ? ?SUMMARY OF ONCOLOGIC HISTORY: ?Oncology History  ?Ductal carcinoma in situ (DCIS) of right breast  ?09/29/2020 Initial Diagnosis  ? Ductal carcinoma in situ (DCIS) of right breast ?  ?10/04/2020 Cancer Staging  ? Staging form: Breast, AJCC 8th Edition ?- Clinical: Stage 0 (cTis (DCIS), cN0, cM0) - Signed by Chauncey Cruel, MD on 10/04/2020 ? ?  ?11/25/2020 Genetic Testing  ? Negative genetic testing:  No pathogenic variants detected on the [employer] CancerNext-Expanded + RNAinsight panel. The report date is 11/25/2020.  ? ?The CancerNext-Expanded + RNAinsight gene panel offered by [employer] and includes sequencing and rearrangement analysis for the following 77 genes: AIP, ALK, APC, ATM, AXIN2, BAP1, BARD1, BLM, BMPR1A, BRCA1, BRCA2, BRIP1, CDC73, CDH1, CDK4, CDKN1B, CDKN2A, CHEK2, CTNNA1, DICER1, FANCC, FH, FLCN, GALNT12, KIF1B, LZTR1, MAX, MEN1, MET, MLH1, MSH2, MSH3, MSH6, MUTYH, NBN, NF1, NF2, NTHL1, PALB2, PHOX2B, PMS2, POT1, PRKAR1A, PTCH1, PTEN, RAD51C, RAD51D, RB1, RECQL, RET, SDHA, SDHAF2, SDHB, SDHC, SDHD, SMAD4, SMARCA4, SMARCB1, SMARCE1, STK11, SUFU, TMEM127, TP53, TSC1, TSC2, VHL and XRCC2 (sequencing and deletion/duplication); EGFR, EGLN1, HOXB13, KIT, MITF, PDGFRA, POLD1 and POLE (sequencing only); EPCAM and GREM1 (deletion/duplication only). RNA data is routinely analyzed for use in variant interpretation for all genes. ?  ?Malignant neoplasm of upper-outer quadrant of  left breast in female, estrogen receptor positive ([place])  ?09/29/2020 Initial Diagnosis  ? Malignant neoplasm of upper-outer quadrant of left breast in female, estrogen receptor positive ([place]) ?  ?10/04/2020 Cancer Staging  ? Staging form: Breast, AJCC 8th Edition ?- Clinical stage from 10/04/2020: Stage IIA (cT3, cN1(f), cM0, G2, ER+, PR+, HER2-) - Signed by Chauncey Cruel, MD on 10/04/2020 ?Stage prefix: Initial diagnosis ?Method of lymph node assessment: Core biopsy ?Histologic grading system: 3 grade system ? ?  ?10/25/2020 - 11/10/2020 Chemotherapy  ? 2 cycles of Adriamycin and Cytoxan.  Chemo discontinued because of rising LFTs. ? ? ?  ? ?  ?11/25/2020 Genetic Testing  ? Negative genetic testing:  No pathogenic variants detected on the [employer] CancerNext-Expanded + RNAinsight panel. The report date is 11/25/2020.  ? ?The CancerNext-Expanded + RNAinsight gene panel offered by [employer] and includes sequencing and rearrangement analysis for the following 77 genes: AIP, ALK, APC, ATM, AXIN2, BAP1, BARD1, BLM, BMPR1A, BRCA1, BRCA2, BRIP1, CDC73, CDH1, CDK4, CDKN1B, CDKN2A, CHEK2, CTNNA1, DICER1, FANCC, FH, FLCN, GALNT12, KIF1B, LZTR1, MAX, MEN1, MET, MLH1, MSH2, MSH3, MSH6, MUTYH, NBN, NF1, NF2, NTHL1, PALB2, PHOX2B, PMS2, POT1, PRKAR1A, PTCH1, PTEN, RAD51C, RAD51D, RB1, RECQL, RET, SDHA, SDHAF2, SDHB, SDHC, SDHD, SMAD4, SMARCA4, SMARCB1, SMARCE1, STK11, SUFU, TMEM127, TP53, TSC1, TSC2, VHL and XRCC2 (sequencing and deletion/duplication); EGFR, EGLN1, HOXB13, KIT, MITF, PDGFRA, POLD1 and POLE (sequencing only); EPCAM and GREM1 (deletion/duplication only). RNA data is routinely analyzed for use in variant interpretation for all genes. ?  ? Anti-estrogen oral therapy  ? anastrozole started 12/06/2020 ?goserelin started 10/11/2020  ?zolendronate started 11/02/2018. Every 12 weeks. ?  ?01/31/2021 Surgery  ? Left mastectomy: Residual multifocal IDC 2 cm with intermediate grade DCIS, 15/15 lymph  nodes positive, margins  negative, lymphovascular space invasion present, ER 60%, PR 10%, HER2 negative, Ki-67 15% ?Right mastectomy: Fibrocystic change no residual cancer or DCIS ?  ? ? ?CHIEF COMPLIANT: Follow-up of breast cancer ? ?INTERVAL HISTORY: Gabriela Sullivan is a 31 y.o. with above-mentioned history of breast cancer currently on antiestrogen therapy with anastrozole. She presents to the clinic today for follow-up.She is tolerating the anastrozole. She stated that she only have had a little diarrhea.She states no hot flashes. She complains of back pain. But no other joint pain.  ? ? ?ALLERGIES:  has No Known Allergies. ? ?MEDICATIONS:  ?Current Outpatient Medications  ?Medication Sig Dispense Refill  ? abemaciclib (VERZENIO) 150 MG tablet Take 1 tablet (150 mg total) by mouth 2 (two) times daily. Swallow tablets whole. Do not chew, crush, or split tablets before swallowing. 56 tablet 3  ? anastrozole (ARIMIDEX) 1 MG tablet Take 1 tablet (1 mg total) by mouth daily. 90 tablet 3  ? calcium carbonate (TUMS - DOSED IN MG ELEMENTAL CALCIUM) 500 MG chewable tablet Chew 2 tablets by mouth every other day.    ? cholecalciferol (VITAMIN D3) 25 MCG (1000 UNIT) tablet Take 1,000 Units by mouth daily.    ? Goserelin Acetate (ZOLADEX [place]) Inject 1 Dose into the skin every 30 (thirty) days.    ? loperamide (IMODIUM) 2 MG capsule Take by mouth as needed for diarrhea or loose stools.    ? omeprazole (PRILOSEC) 20 MG capsule Take 1 capsule (20 mg total) by mouth daily. 30 capsule 0  ? ondansetron (ZOFRAN) 8 MG tablet Take by mouth every 8 (eight) hours as needed for nausea or vomiting.    ? potassium chloride SA (KLOR-CON M) 20 MEQ tablet Take 1 tablet (20 mEq total) by mouth 2 (two) times daily. 30 tablet 2  ? Semaglutide (OZEMPIC, 0.25 OR 0.5 MG/DOSE, [place]) Inject 0.25 mg into the skin once a week.    ? Vitamin D, Ergocalciferol, (DRISDOL) 50000 UNITS CAPS capsule Take 50,000 Units by mouth every Monday.    ? ?No current facility-administered medications  for this visit.  ? ? ?PHYSICAL EXAMINATION: ?ECOG PERFORMANCE STATUS: 1 - Symptomatic but completely ambulatory ? ?Vitals:  ? 08/21/21 1108  ?BP: 133/83  ?Pulse: 78  ?Resp: 17  ?Temp: (!) 97.5 ?F (36.4 ?C)  ?SpO2: 100%  ? ?Filed Weights  ? 08/21/21 1108  ?Weight: 176 lb 8 oz (80.1 kg)  ? ?  ? ?LABORATORY DATA:  ?I have reviewed the data as listed ?CMP Latest Ref Rng & Units 08/21/2021 08/03/2021 07/24/2021  ?Glucose 70 - 99 mg/dL 92 95 88  ?BUN 6 - 20 mg/dL $Remove'13 16 17  'TbtNzlX$ ?Creatinine 0.44 - 1.00 mg/dL 0.53 0.64 0.61  ?Sodium 135 - 145 mmol/L 140 139 142  ?Potassium 3.5 - 5.1 mmol/L 3.8 3.8 3.3(L)  ?Chloride 98 - 111 mmol/L 109 104 108  ?CO2 22 - 32 mmol/L $RemoveB'26 25 27  'aotchKXW$ ?Calcium 8.9 - 10.3 mg/dL 9.2 9.5 9.5  ?Total Protein 6.5 - 8.1 g/dL 7.2 8.5(H) 7.6  ?Total Bilirubin 0.3 - 1.2 mg/dL 0.7 1.1 0.7  ?Alkaline Phos 38 - 126 U/L 36(L) 41 41  ?AST 15 - 41 U/L 24 32 26  ?ALT 0 - 44 U/L 28 35 32  ? ? ?Lab Results  ?Component Value Date  ? WBC 2.7 (L) 08/21/2021  ? HGB 11.2 (L) 08/21/2021  ? HCT 30.8 (L) 08/21/2021  ? MCV 92.2 08/21/2021  ? PLT 255 08/21/2021  ? NEUTROABS  1.7 08/21/2021  ? ? 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% ?09/25/2020: Right breast biopsy: DCIS grade 1, ER/PR positive ?10/06/2020  breast MRI showed a 11 cm area of malignancy, with a second 1.9 cm mass, and skin thickening concerning for inflammatory breast carcinoma; at least 6 abnormal lymph nodes noted ?10/13/2020 CT chest shows clear lungs and no obvious liver involvement but multiple lytic bone lesions in addition to the findings and MRI above ?10/21/2020 spine MRI: Shows widely scattered thoracic and lumbar vertebral body metastases ?10/24/2020: 2 cycles of dose dense Adriamycin and Cytoxan discontinued because of rising LFTs ?12/01/2020: MRI abdomen: No  evidence of metastatic disease in the liver ?01/31/2021:Left mastectomy: Residual multifocal IDC 2 cm with intermediate grade DCIS, 15/15 lymph nodes positive, margins negative, lymphovascular space invasion present,

## 2021-08-22 ENCOUNTER — Other Ambulatory Visit (HOSPITAL_COMMUNITY): Payer: Self-pay

## 2021-08-27 ENCOUNTER — Ambulatory Visit: Payer: Medicaid Other | Attending: General Surgery

## 2021-08-27 ENCOUNTER — Other Ambulatory Visit: Payer: Self-pay

## 2021-08-27 VITALS — Wt 176.1 lb

## 2021-08-27 DIAGNOSIS — Z483 Aftercare following surgery for neoplasm: Secondary | ICD-10-CM | POA: Insufficient documentation

## 2021-08-27 NOTE — Therapy (Signed)
?OUTPATIENT PHYSICAL THERAPY SOZO SCREENING NOTE ? ? ?Patient Name: Gabriela Sullivan ?MRN: [id number] ?DOB:14-Dec-1990, 31 y.o., female ?Today's Date: 08/27/2021 ? ?PCP: [name], MD ?REFERRING PROVIDER: Rolm Bookbinder, MD ? ? PT End of Session - 08/27/21 0813   ? ? Visit Number 8   # unchanged due to screen only  ? PT Start Time 0802   ? PT Stop Time 0811   ? PT Time Calculation (min) 9 min   ? Activity Tolerance Patient tolerated treatment well   ? Behavior During Therapy [facility] for tasks assessed/performed   ? ?  ?  ? ?  ? ? ?Past Medical History:  ?Diagnosis Date  ? Asthma   ? remote  ? Breast cancer ([place])   ? Elevated liver enzymes   ? Family history of breast cancer   ? Family history of ovarian cancer   ? Family history of prostate cancer   ? Family history of stomach cancer   ? Hepatic steatosis   ? Hyperglycemia   ? A1c 7.6% 12/19/20  ? Migraines   ? PCOS (polycystic ovarian syndrome)   ? Pre-diabetes   ? ?Past Surgical History:  ?Procedure Laterality Date  ? MASTECTOMY W/ SENTINEL NODE BIOPSY Right 01/31/2021  ? Procedure: RIGHT SIMPLE MASTECTOMY;  Surgeon: Erroll Luna, MD;  Location: [facility];  Service: General;  Laterality: Right;  ? MODIFIED MASTECTOMY Left 01/31/2021  ? Procedure: LEFT MODIFIED RADICAL MASTECTOMY;  Surgeon: Erroll Luna, MD;  Location: [facility];  Service: General;  Laterality: Left;  ? NO PAST SURGERIES    ? PORTACATH PLACEMENT Right 10/18/2020  ? Procedure: INSERTION PORT-A-CATH;  Surgeon: Erroll Luna, MD;  Location: [place] [place];  Service: General;  Laterality: Right;  ? ?Patient Active Problem List  ? Diagnosis Date Noted  ? Left breast cancer with T3 tumor, >5 cm in greatest dimension ([place]) 01/31/2021  ? Bone metastases ([place]) 12/28/2020  ? Port-A-Cath in place 12/06/2020  ? Genetic testing 11/25/2020  ? Goals of care, counseling/discussion 10/25/2020  ? Lytic bone lesions on xray 10/16/2020  ? Family history of ovarian cancer   ? Family history of prostate cancer    ? Family history of breast cancer   ? Family history of stomach cancer   ? 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  ? Malignant neoplasm of upper-outer quadrant of left breast in female, estrogen receptor positive ([place]) 09/29/2020  ? Elevated LFTs   ? ? ?REFERRING DIAG: left breast cancer at risk for lymphedema ? ?THERAPY DIAG:  ?Aftercare following surgery for neoplasm ? ?PERTINENT HISTORY: Patient was diagnosed on 09/26/2020 with 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 It is ER/PR positive and HER2 negative with a Ki67 of 15%. She underwent neoadjuvant chemotherapy beginning 10/24/2020 for 2 rounds which was stopped due to elevated LFTs. She had a bilateral mastectomy with left axillary node dissection on 01/31/2021 with 15/15 nodes positive. She started Anastrozole on 12/06/2020. 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  ? ?PRECAUTIONS: left UE Lymphedema risk, None ? ?SUBJECTIVE: Pt returns for her 3 month L-Dex screen.  ? ?PAIN:  ?Are you having pain? No ? ?SOZO SCREENING: ?Patient was assessed today using the SOZO machine to determine the lymphedema index score. This was compared to her baseline score. It was determined that she is within the recommended range when compared to her baseline and no further action is needed at this  time. She will continue SOZO screenings. These are done every 3 months for 2 years post operatively followed by every 6 months for 2 years, and then annually. ? ?Plan: Cont every 3 month L-Dex screens for up to 2 years from her Lt ALND (~02/01/2023) ? ? ? ?Otelia Limes, PTA ?08/27/2021, 9:42 AM ? ?  ? ?

## 2021-09-11 ENCOUNTER — Other Ambulatory Visit (HOSPITAL_COMMUNITY): Payer: Self-pay

## 2021-09-14 ENCOUNTER — Other Ambulatory Visit: Payer: Self-pay

## 2021-09-14 DIAGNOSIS — Z170 Estrogen receptor positive status [ER+]: Secondary | ICD-10-CM

## 2021-09-18 ENCOUNTER — Inpatient Hospital Stay: Payer: Medicaid Other | Attending: Oncology

## 2021-09-18 ENCOUNTER — Inpatient Hospital Stay: Payer: Medicaid Other

## 2021-09-18 ENCOUNTER — Other Ambulatory Visit: Payer: Self-pay | Admitting: Pharmacist

## 2021-09-18 ENCOUNTER — Other Ambulatory Visit (HOSPITAL_COMMUNITY): Payer: Self-pay

## 2021-09-18 ENCOUNTER — Other Ambulatory Visit: Payer: Self-pay

## 2021-09-18 ENCOUNTER — Inpatient Hospital Stay: Payer: Medicaid Other | Admitting: Pharmacist

## 2021-09-18 VITALS — BP 146/84 | HR 79 | Temp 97.5°F | Resp 18 | Ht 60.0 in | Wt 177.6 lb

## 2021-09-18 DIAGNOSIS — C7951 Secondary malignant neoplasm of bone: Secondary | ICD-10-CM | POA: Diagnosis not present

## 2021-09-18 DIAGNOSIS — Z95828 Presence of other vascular implants and grafts: Secondary | ICD-10-CM

## 2021-09-18 DIAGNOSIS — Z170 Estrogen receptor positive status [ER+]: Secondary | ICD-10-CM

## 2021-09-18 DIAGNOSIS — Z5111 Encounter for antineoplastic chemotherapy: Secondary | ICD-10-CM | POA: Insufficient documentation

## 2021-09-18 DIAGNOSIS — C50412 Malignant neoplasm of upper-outer quadrant of left female breast: Secondary | ICD-10-CM | POA: Diagnosis present

## 2021-09-18 LAB — CBC WITH DIFFERENTIAL (CANCER CENTER ONLY)
Abs Immature Granulocytes: 0 10*3/uL (ref 0.00–0.07)
Basophils Absolute: 0 10*3/uL (ref 0.0–0.1)
Basophils Relative: 1 %
Eosinophils Absolute: 0.1 10*3/uL (ref 0.0–0.5)
Eosinophils Relative: 1 %
HCT: 33.3 % — ABNORMAL LOW (ref 36.0–46.0)
Hemoglobin: 12.2 g/dL (ref 12.0–15.0)
Immature Granulocytes: 0 %
Lymphocytes Relative: 25 %
Lymphs Abs: 0.9 10*3/uL (ref 0.7–4.0)
MCH: 34.6 pg — ABNORMAL HIGH (ref 26.0–34.0)
MCHC: 36.6 g/dL — ABNORMAL HIGH (ref 30.0–36.0)
MCV: 94.3 fL (ref 80.0–100.0)
Monocytes Absolute: 0.2 10*3/uL (ref 0.1–1.0)
Monocytes Relative: 4 %
Neutro Abs: 2.4 10*3/uL (ref 1.7–7.7)
Neutrophils Relative %: 69 %
Platelet Count: 269 10*3/uL (ref 150–400)
RBC: 3.53 MIL/uL — ABNORMAL LOW (ref 3.87–5.11)
RDW: 13.2 % (ref 11.5–15.5)
WBC Count: 3.5 10*3/uL — ABNORMAL LOW (ref 4.0–10.5)
nRBC: 0 % (ref 0.0–0.2)

## 2021-09-18 LAB — CMP (CANCER CENTER ONLY)
ALT: 32 U/L (ref 0–44)
AST: 22 U/L (ref 15–41)
Albumin: 4.5 g/dL (ref 3.5–5.0)
Alkaline Phosphatase: 35 U/L — ABNORMAL LOW (ref 38–126)
Anion gap: 7 (ref 5–15)
BUN: 18 mg/dL (ref 6–20)
CO2: 26 mmol/L (ref 22–32)
Calcium: 9.6 mg/dL (ref 8.9–10.3)
Chloride: 107 mmol/L (ref 98–111)
Creatinine: 0.64 mg/dL (ref 0.44–1.00)
GFR, Estimated: >60 mL/min (ref >60)
Glucose, Bld: 96 mg/dL (ref 70–99)
Potassium: 3.6 mmol/L (ref 3.5–5.1)
Sodium: 140 mmol/L (ref 135–145)
Total Bilirubin: 0.6 mg/dL (ref 0.3–1.2)
Total Protein: 7.8 g/dL (ref 6.5–8.1)

## 2021-09-18 MED ORDER — ABEMACICLIB 150 MG PO TABS
150.0000 mg | ORAL_TABLET | Freq: Two times a day (BID) | ORAL | 3 refills | Status: DC
  Filled 2021-09-18 – 2021-10-09 (×2): qty 56, 28d supply, fill #0
  Filled 2021-11-07: qty 56, 28d supply, fill #1
  Filled 2021-12-05: qty 56, 28d supply, fill #2
  Filled 2022-01-01: qty 56, 28d supply, fill #3

## 2021-09-18 MED ORDER — GOSERELIN ACETATE 3.6 MG ~~LOC~~ IMPL
3.6000 mg | DRUG_IMPLANT | Freq: Once | SUBCUTANEOUS | Status: AC
  Administered 2021-09-18: 3.6 mg via SUBCUTANEOUS
  Filled 2021-09-18: qty 3.6

## 2021-09-18 NOTE — Progress Notes (Signed)
Gabriela Sullivan  ?     Telephone: [phone]?Fax: [phone]  ? ?Oncology Clinical Pharmacist Practitioner Progress Note ? ?[name] was contacted via in-person to discuss her chemotherapy regimen for abemaciclib which they receive under the care of Dr. Nicholas Lose. ? ?Current treatment regimen and start date ?Abemaciclib (05/07/21) ?Goserelin (10/11/20) ?Anastrozole (04/25/21) ?Zoledronic acid (11/01/20) ? ?Interval History ?She continues on abemaciclib 150 mg by mouth every 12 hours on days 1 to 28 of a 28-day cycle. This is being given  in combination with goserelin (due today), anastrozole, and every 12 week zoledronic acid . Therapy is planned to continue until disease progression or unacceptable toxicity.  ? ?Response to Therapy ?Gabriela Sullivan was seen today by clinical pharmacy for follow-up to her abemaciclib management.  She last saw clinical pharmacy on 07/24/21 and Dr. Lindi Adie on 08/21/21.  She states she is doing very well and not reporting any side effects from abemaciclib at this time.  On 08/03/21 she reported to the ED after some blood while vomiting. She was evaluated during that visit and ultimately prescribed a proton pump inhibitor.  Ms. Salonga states that today the proton pump inhibitor has been working effectively and she has not had any more nausea or vomiting since.  At her last appointment with Dr. Lindi Adie, he stated that he would see her in May and then if labs were stable and she was tolerating abemaciclib, she could push out her appointments/labs to every 3 months with scans every 6 months.  Her last scans were in mid March.  She will continue to receive every 4-week goserelin which is due today and every 12-week zoledronic acid which is due again in mid May.  These will likely both be administered on 10/23/21 when she sees Dr. Lindi Adie again with labs.  We will tentatively put in a follow-up appointment with clinical pharmacy for August of this year which will be 3 months from Dr.  Geralyn Flash next appointment in May.  Ms. Eno knows to contact the clinic in the interim with any questions or concerns. Labs, vitals, treatment parameters, and manufacturer guidelines assessing toxicity were reviewed with Pauline Aus today. Based on these values, patient is in agreement to continue abemaciclib therapy at this time. ? ?Allergies ?No Known Allergies ? ?Vitals ? ?  09/18/2021  ? 10:25 AM 08/27/2021  ?  8:12 AM 08/21/2021  ? 11:08 AM  ?Vitals with BMI  ?Height '5\' 0"'$   '5\' 0"'$   ?Weight 177 lbs 10 oz 176 lbs 2 oz 176 lbs 8 oz  ?BMI 34.69 34.4 34.47  ?Systolic 259  563  ?Diastolic 84  83  ?Pulse 79  78  ?  ? ?Laboratory Data ? ?  Latest Ref Rng & Units 09/18/2021  ? 10:06 AM 08/21/2021  ? 10:40 AM 08/03/2021  ? 11:03 PM  ?CBC EXTENDED  ?WBC 4.0 - 10.5 K/uL 3.5   2.7   4.0    ?RBC 3.87 - 5.11 MIL/uL 3.53   3.34   3.80    ?Hemoglobin 12.0 - 15.0 g/dL 12.2   11.2   12.6    ?HCT 36.0 - 46.0 % 33.3   30.8   34.3    ?Platelets 150 - 400 K/uL 269   255   285    ?NEUT# 1.7 - 7.7 K/uL 2.4   1.7   2.5    ?Lymph# 0.7 - 4.0 K/uL 0.9   0.8   1.3    ?  ? ?  Latest Ref Rng & Units 09/18/2021  ? 10:06 AM 08/21/2021  ? 10:40 AM 08/03/2021  ? 11:03 PM  ?CMP  ?Glucose 70 - 99 mg/dL 96   92   95    ?BUN 6 - 20 mg/dL '18   13   16    '$ ?Creatinine 0.44 - 1.00 mg/dL 0.64   0.53   0.64    ?Sodium 135 - 145 mmol/L 140   140   139    ?Potassium 3.5 - 5.1 mmol/L 3.6   3.8   3.8    ?Chloride 98 - 111 mmol/L 107   109   104    ?CO2 22 - 32 mmol/L '26   26   25    '$ ?Calcium 8.9 - 10.3 mg/dL 9.6   9.2   9.5    ?Total Protein 6.5 - 8.1 g/dL 7.8   7.2   8.5    ?Total Bilirubin 0.3 - 1.2 mg/dL 0.6   0.7   1.1    ?Alkaline Phos 38 - 126 U/L 35   36   41    ?AST 15 - 41 U/L 22   24   32    ?ALT 0 - 44 U/L 32   28   35    ?  ?No results found for: MG ? ?Adverse Effects Assessment ?Vomiting: resolved. Currently on PPI and has anti-nausea medications at home ? ?Adherence Assessment ?Marrietta Thunder reports missing 0 doses over the past 4 weeks.   ?Reason for missed  dose: n/a ?Patient was re-educated on importance of adherence.  ? ?Access Assessment ?Grissel Tyrell is currently receiving her abemaciclib through [facility]  ?Insurance concerns:  none ? ?Medication Reconciliation ?The patient's medication list was reviewed today with the patient? Yes ?New medications or herbal supplements have recently been started?  Yes, omeprazole ?Any medications have been discontinued? No  ?The medication list was updated and reconciled based on the patient's most recent medication list in the electronic medical record (EMR) including herbal products and OTC medications.  ? ?Medications ?Current Outpatient Medications  ?Medication Sig Dispense Refill  ? abemaciclib (VERZENIO) 150 MG tablet Take 1 tablet (150 mg total) by mouth 2 (two) times daily. Swallow tablets whole. Do not chew, crush, or split tablets before swallowing. 56 tablet 3  ? anastrozole (ARIMIDEX) 1 MG tablet Take 1 tablet (1 mg total) by mouth daily. 90 tablet 3  ? calcium carbonate (TUMS - DOSED IN MG ELEMENTAL CALCIUM) 500 MG chewable tablet Chew 2 tablets by mouth every other day.    ? cholecalciferol (VITAMIN D3) 25 MCG (1000 UNIT) tablet Take 1,000 Units by mouth daily.    ? Goserelin Acetate (ZOLADEX [place]) Inject 1 Dose into the skin every 30 (thirty) days.    ? loperamide (IMODIUM) 2 MG capsule Take by mouth as needed for diarrhea or loose stools.    ? omeprazole (PRILOSEC) 20 MG capsule Take 1 capsule (20 mg total) by mouth daily. 30 capsule 0  ? ondansetron (ZOFRAN) 8 MG tablet Take by mouth every 8 (eight) hours as needed for nausea or vomiting.    ? potassium chloride SA (KLOR-CON M) 20 MEQ tablet Take 1 tablet (20 mEq total) by mouth 2 (two) times daily. 30 tablet 2  ? Semaglutide (OZEMPIC, 0.25 OR 0.5 MG/DOSE, [place]) Inject 0.25 mg into the skin once a week.    ? Vitamin D, Ergocalciferol, (DRISDOL) 50000 UNITS CAPS capsule Take 50,000 Units by mouth every Monday.    ? ?  No current facility-administered  medications for this visit.  ? ? ?Drug-Drug Interactions (DDIs) ?DDIs were evaluated? Yes ?Significant DDIs? No  ?The patient was instructed to speak with their health care provider and/or the oral chemotherapy pharmacist before starting any new drug, including prescription or over the counter, natural / herbal products, or vitamins. ? ?Supportive Care ?Diarrhea: we reviewed that diarrhea is common with abemaciclib and confirmed that she does have loperamide (Imodium) at home.  We reviewed how to take this medication PRN ?Neutropenia: we discussed the importance of having a thermometer and what the Centers for Disease Control and Prevention (CDC) considers a fever which is 100.4?F (38?C) or higher.  Gave patient 24/7 triage line to call if any fevers or symptoms ?ILD/Pneumonitis: we reviewed potential symptoms including cough, shortness, and fatigue.  ?Hepatotoxicity: WNL. 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  ? ?Dosing Assessment ?Hepatic adjustments needed? No  ?Renal adjustments needed? No  ?Toxicity adjustments needed? No  ?The current dosing regimen is appropriate to continue at this time. ? ?Follow-Up Plan ?Labs, Dr. Lindi Adie visit, goserelin, and zoledronic acid on 10/23/21. Dr. Lindi Adie may do visits every 3 months after this appointment if stable on abemaciclib ?Continue abemaciclib ?Continue anastrozole ?Will add labs, pharmacy clinic visit, goserelin, zoledronic acid tentatively for 01/15/22 which will be 12 weeks from next appointment on 10/23/21 ? ?Pauline Aus participated in the discussion, expressed understanding, and voiced agreement with the above plan. All questions were answered to  her satisfaction. The patient was advised to contact the clinic at [phone] with any questions or concerns prior to her return visit.  ? ?I spent 30 minutes assessing and educating the patient. ? ?Linus Mako

## 2021-10-09 ENCOUNTER — Other Ambulatory Visit (HOSPITAL_COMMUNITY): Payer: Self-pay

## 2021-10-15 NOTE — Progress Notes (Signed)
Patient Care Team: [name], MD as PCP - General (Family Medicine) Rockwell Germany, RN as Oncology Nurse Navigator Mauro Kaufmann, RN as Oncology Nurse Navigator Erroll Luna, MD as Consulting Physician (General Surgery) Kyung Rudd, MD as Consulting Physician (Radiation Oncology) Nicholas Lose, MD as Consulting Physician (Hematology and Oncology)  DIAGNOSIS:  Encounter Diagnoses  Name Primary?   Malignant neoplasm of upper-outer quadrant of left breast in female, estrogen receptor positive ([place])    Metastasis to bone ([place]) Yes    SUMMARY OF ONCOLOGIC HISTORY: Oncology History  Ductal carcinoma in situ (DCIS) of right breast  09/29/2020 Initial Diagnosis   Ductal carcinoma in situ (DCIS) of right breast    10/04/2020 Cancer Staging   Staging form: Breast, AJCC 8th Edition - Clinical: Stage 0 (cTis (DCIS), cN0, cM0) - Signed by Chauncey Cruel, MD on 10/04/2020    11/25/2020 Genetic Testing   Negative genetic testing:  No pathogenic variants detected on the [employer] CancerNext-Expanded + RNAinsight panel. The report date is 11/25/2020.   The CancerNext-Expanded + RNAinsight gene panel offered by [employer] and includes sequencing and rearrangement analysis for the following 77 genes: AIP, ALK, APC, ATM, AXIN2, BAP1, BARD1, BLM, BMPR1A, BRCA1, BRCA2, BRIP1, CDC73, CDH1, CDK4, CDKN1B, CDKN2A, CHEK2, CTNNA1, DICER1, FANCC, FH, FLCN, GALNT12, KIF1B, LZTR1, MAX, MEN1, MET, MLH1, MSH2, MSH3, MSH6, MUTYH, NBN, NF1, NF2, NTHL1, PALB2, PHOX2B, PMS2, POT1, PRKAR1A, PTCH1, PTEN, RAD51C, RAD51D, RB1, RECQL, RET, SDHA, SDHAF2, SDHB, SDHC, SDHD, SMAD4, SMARCA4, SMARCB1, SMARCE1, STK11, SUFU, TMEM127, TP53, TSC1, TSC2, VHL and XRCC2 (sequencing and deletion/duplication); EGFR, EGLN1, HOXB13, KIT, MITF, PDGFRA, POLD1 and POLE (sequencing only); EPCAM and GREM1 (deletion/duplication only). RNA data is routinely analyzed for use in variant interpretation for all genes.   Malignant  neoplasm of upper-outer quadrant of left breast in female, estrogen receptor positive ([place])  09/29/2020 Initial Diagnosis   Malignant neoplasm of upper-outer quadrant of left breast in female, estrogen receptor positive ([place])    10/04/2020 Cancer Staging   Staging form: Breast, AJCC 8th Edition - Clinical stage from 10/04/2020: Stage IIA (cT3, cN1(f), cM0, G2, ER+, PR+, HER2-) - Signed by Chauncey Cruel, MD on 10/04/2020 Stage prefix: Initial diagnosis Method of lymph node assessment: Core biopsy Histologic grading system: 3 grade system    10/25/2020 - 11/10/2020 Chemotherapy   2 cycles of Adriamycin and Cytoxan.  Chemo discontinued because of rising LFTs.        11/25/2020 Genetic Testing   Negative genetic testing:  No pathogenic variants detected on the [employer] CancerNext-Expanded + RNAinsight panel. The report date is 11/25/2020.   The CancerNext-Expanded + RNAinsight gene panel offered by [employer] and includes sequencing and rearrangement analysis for the following 77 genes: AIP, ALK, APC, ATM, AXIN2, BAP1, BARD1, BLM, BMPR1A, BRCA1, BRCA2, BRIP1, CDC73, CDH1, CDK4, CDKN1B, CDKN2A, CHEK2, CTNNA1, DICER1, FANCC, FH, FLCN, GALNT12, KIF1B, LZTR1, MAX, MEN1, MET, MLH1, MSH2, MSH3, MSH6, MUTYH, NBN, NF1, NF2, NTHL1, PALB2, PHOX2B, PMS2, POT1, PRKAR1A, PTCH1, PTEN, RAD51C, RAD51D, RB1, RECQL, RET, SDHA, SDHAF2, SDHB, SDHC, SDHD, SMAD4, SMARCA4, SMARCB1, SMARCE1, STK11, SUFU, TMEM127, TP53, TSC1, TSC2, VHL and XRCC2 (sequencing and deletion/duplication); EGFR, EGLN1, HOXB13, KIT, MITF, PDGFRA, POLD1 and POLE (sequencing only); EPCAM and GREM1 (deletion/duplication only). RNA data is routinely analyzed for use in variant interpretation for all genes.    Anti-estrogen oral therapy   anastrozole started 12/06/2020 goserelin started 10/11/2020  zolendronate started 11/02/2018. Every 12 weeks.   01/31/2021 Surgery   Left mastectomy: Residual multifocal  IDC 2 cm with intermediate grade DCIS,  15/15 lymph nodes positive, margins negative, lymphovascular space invasion present, ER 60%, PR 10%, HER2 negative, Ki-67 15% Right mastectomy: Fibrocystic change no residual cancer or DCIS     CHIEF COMPLIANT: : Follow-up of breast cancer on anastrozole And Verzenio  INTERVAL HISTORY: Gabriela Sullivan is a 31 y.o. with above-mentioned history of breast cancer currently on antiestrogen therapy with anastrozole. She presents to the clinic today for follow-up. Denies diarrhea. Slightly fatigue. Have some hot flashes but manageable.   ALLERGIES:  has No Known Allergies.  MEDICATIONS:  Current Outpatient Medications  Medication Sig Dispense Refill   abemaciclib (VERZENIO) 150 MG tablet Take 1 tablet (150 mg total) by mouth 2 (two) times daily. Swallow tablets whole. Do not chew, crush, or split tablets before swallowing. 56 tablet 3   anastrozole (ARIMIDEX) 1 MG tablet Take 1 tablet (1 mg total) by mouth daily. 90 tablet 3   calcium carbonate (TUMS - DOSED IN MG ELEMENTAL CALCIUM) 500 MG chewable tablet Chew 2 tablets by mouth every other day.     cholecalciferol (VITAMIN D3) 25 MCG (1000 UNIT) tablet Take 1,000 Units by mouth daily.     Goserelin Acetate (ZOLADEX [place]) Inject 1 Dose into the skin every 30 (thirty) days.     loperamide (IMODIUM) 2 MG capsule Take by mouth as needed for diarrhea or loose stools.     omeprazole (PRILOSEC) 20 MG capsule Take 1 capsule (20 mg total) by mouth daily. 30 capsule 0   ondansetron (ZOFRAN) 8 MG tablet Take by mouth every 8 (eight) hours as needed for nausea or vomiting.     potassium chloride SA (KLOR-CON M) 20 MEQ tablet Take 1 tablet (20 mEq total) by mouth 2 (two) times daily. 30 tablet 2   Semaglutide (OZEMPIC, 0.25 OR 0.5 MG/DOSE, [place]) Inject 0.25 mg into the skin once a week.     Vitamin D, Ergocalciferol, (DRISDOL) 50000 UNITS CAPS capsule Take 50,000 Units by mouth every Monday.     No current facility-administered medications for this visit.     PHYSICAL EXAMINATION: ECOG PERFORMANCE STATUS: 1 - Symptomatic but completely ambulatory  Vitals:   10/23/21 0913  BP: (!) 146/83  Pulse: 82  Resp: 18  Temp: (!) 97.5 F (36.4 C)  SpO2: 100%   Filed Weights   10/23/21 0913  Weight: 180 lb 3.2 oz (81.7 kg)      LABORATORY DATA:  I have reviewed the data as listed    Latest Ref Rng & Units 10/23/2021    8:46 AM 09/18/2021   10:06 AM 08/21/2021   10:40 AM  CMP  Glucose 70 - 99 mg/dL 107   96   92    BUN 6 - 20 mg/dL $Remove'17   18   13    'slSPxeI$ Creatinine 0.44 - 1.00 mg/dL 0.63   0.64   0.53    Sodium 135 - 145 mmol/L 141   140   140    Potassium 3.5 - 5.1 mmol/L 3.5   3.6   3.8    Chloride 98 - 111 mmol/L 110   107   109    CO2 22 - 32 mmol/L $RemoveB'26   26   26    'GIdyIQbn$ Calcium 8.9 - 10.3 mg/dL 9.2   9.6   9.2    Total Protein 6.5 - 8.1 g/dL 7.2   7.8   7.2    Total Bilirubin 0.3 - 1.2 mg/dL 0.6   0.6  0.7    Alkaline Phos 38 - 126 U/L 31   35   36    AST 15 - 41 U/L 32   22   24    ALT 0 - 44 U/L 42   32   28      Lab Results  Component Value Date   WBC 3.7 (L) 10/23/2021   HGB 11.3 (L) 10/23/2021   HCT 30.1 (L) 10/23/2021   MCV 93.5 10/23/2021   PLT 222 10/23/2021   NEUTROABS 2.4 10/23/2021    ASSESSMENT & PLAN:  Malignant neoplasm of upper-outer quadrant of left breast in female, estrogen receptor positive [facility]) 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% 09/25/2020: Right breast biopsy: DCIS grade 1, ER/PR positive 10/06/2020  breast MRI showed a 11 cm area of malignancy, with a second 1.9 cm mass, and skin thickening concerning for inflammatory breast carcinoma; at least 6 abnormal lymph nodes noted 10/13/2020 CT chest shows clear lungs and no obvious liver involvement but multiple lytic bone lesions in addition to the findings and MRI above 10/21/2020 spine MRI: Shows widely scattered thoracic and lumbar vertebral body metastases 10/24/2020: 2 cycles of  dose dense Adriamycin and Cytoxan discontinued because of rising LFTs 12/01/2020: MRI abdomen: No evidence of metastatic disease in the liver 01/31/2021:Left mastectomy: Residual multifocal IDC 2 cm with intermediate grade DCIS, 15/15 lymph nodes positive, margins negative, lymphovascular space invasion present, ER 60%, PR 10%, HER2 negative, Ki-67 15% Right mastectomy: Fibrocystic change no residual cancer or DCIS   Current treatment: Anastrozole with goserelin and Zometa and Verzenio   Anastrozole toxicities: Tolerating it very well Abemaciclib toxicities: Very occasional loose stools    08/14/2021: CT CAP and bone scan: Stable bone metastases I sent a referral to Dr. Iran Planas for breast reconstruction. Follow-up in 3 months with scans    Orders Placed This Encounter  Procedures   CT CHEST ABDOMEN PELVIS W CONTRAST    Standing Status:   Future    Standing Expiration Date:   10/24/2022    Order Specific Question:   Is patient pregnant?    Answer:   No    Order Specific Question:   Preferred imaging location?    Answer:   [facility]    Order Specific Question:   Release to patient    Answer:   Immediate    Order Specific Question:   Is Oral Contrast requested for this exam?    Answer:   Yes, Per Radiology protocol   NM Bone Scan Whole Body    Standing Status:   Future    Standing Expiration Date:   10/23/2022    Order Specific Question:   If indicated for the ordered procedure, I authorize the administration of a radiopharmaceutical per Radiology protocol    Answer:   Yes    Order Specific Question:   Is the patient pregnant?    Answer:   No    Order Specific Question:   Preferred imaging location?    Answer:   [facility]    Order Specific Question:   Release to patient    Answer:   Immediate   The patient has a good understanding of the overall plan. she agrees with it. she will call with any problems that may develop before the next visit here. Total time  spent: 30 mins including face to face time and time spent for planning, charting and  co-ordination of care   Harriette Ohara, MD 10/23/21    I Gardiner Coins am scribing for Dr. Lindi Adie  I have reviewed the above documentation for accuracy and completeness, and I agree with the above.

## 2021-10-16 ENCOUNTER — Other Ambulatory Visit (HOSPITAL_COMMUNITY): Payer: Self-pay

## 2021-10-22 ENCOUNTER — Other Ambulatory Visit: Payer: Self-pay | Admitting: *Deleted

## 2021-10-22 ENCOUNTER — Other Ambulatory Visit (HOSPITAL_COMMUNITY): Payer: Self-pay

## 2021-10-22 ENCOUNTER — Encounter: Payer: Self-pay | Admitting: Adult Health

## 2021-10-22 DIAGNOSIS — Z170 Estrogen receptor positive status [ER+]: Secondary | ICD-10-CM

## 2021-10-23 ENCOUNTER — Inpatient Hospital Stay: Payer: Medicaid Other | Attending: Oncology

## 2021-10-23 ENCOUNTER — Inpatient Hospital Stay (HOSPITAL_BASED_OUTPATIENT_CLINIC_OR_DEPARTMENT_OTHER): Payer: Medicaid Other | Admitting: Hematology and Oncology

## 2021-10-23 ENCOUNTER — Inpatient Hospital Stay: Payer: Medicaid Other

## 2021-10-23 ENCOUNTER — Other Ambulatory Visit: Payer: Self-pay | Admitting: *Deleted

## 2021-10-23 ENCOUNTER — Other Ambulatory Visit: Payer: Self-pay

## 2021-10-23 VITALS — BP 146/83 | HR 82 | Temp 97.5°F | Resp 18 | Ht 60.0 in | Wt 180.2 lb

## 2021-10-23 DIAGNOSIS — Z170 Estrogen receptor positive status [ER+]: Secondary | ICD-10-CM

## 2021-10-23 DIAGNOSIS — Z5111 Encounter for antineoplastic chemotherapy: Secondary | ICD-10-CM | POA: Insufficient documentation

## 2021-10-23 DIAGNOSIS — C50412 Malignant neoplasm of upper-outer quadrant of left female breast: Secondary | ICD-10-CM

## 2021-10-23 DIAGNOSIS — C7951 Secondary malignant neoplasm of bone: Secondary | ICD-10-CM | POA: Diagnosis not present

## 2021-10-23 DIAGNOSIS — Z95828 Presence of other vascular implants and grafts: Secondary | ICD-10-CM

## 2021-10-23 LAB — CBC WITH DIFFERENTIAL (CANCER CENTER ONLY)
Abs Immature Granulocytes: 0.01 10*3/uL (ref 0.00–0.07)
Basophils Absolute: 0.1 10*3/uL (ref 0.0–0.1)
Basophils Relative: 2 %
Eosinophils Absolute: 0.1 10*3/uL (ref 0.0–0.5)
Eosinophils Relative: 2 %
HCT: 30.1 % — ABNORMAL LOW (ref 36.0–46.0)
Hemoglobin: 11.3 g/dL — ABNORMAL LOW (ref 12.0–15.0)
Immature Granulocytes: 0 %
Lymphocytes Relative: 27 %
Lymphs Abs: 1 10*3/uL (ref 0.7–4.0)
MCH: 35.1 pg — ABNORMAL HIGH (ref 26.0–34.0)
MCHC: 37.5 g/dL — ABNORMAL HIGH (ref 30.0–36.0)
MCV: 93.5 fL (ref 80.0–100.0)
Monocytes Absolute: 0.2 10*3/uL (ref 0.1–1.0)
Monocytes Relative: 5 %
Neutro Abs: 2.4 10*3/uL (ref 1.7–7.7)
Neutrophils Relative %: 64 %
Platelet Count: 222 10*3/uL (ref 150–400)
RBC: 3.22 MIL/uL — ABNORMAL LOW (ref 3.87–5.11)
RDW: 12.8 % (ref 11.5–15.5)
WBC Count: 3.7 10*3/uL — ABNORMAL LOW (ref 4.0–10.5)
nRBC: 0 % (ref 0.0–0.2)

## 2021-10-23 LAB — CMP (CANCER CENTER ONLY)
ALT: 42 U/L (ref 0–44)
AST: 32 U/L (ref 15–41)
Albumin: 4.1 g/dL (ref 3.5–5.0)
Alkaline Phosphatase: 31 U/L — ABNORMAL LOW (ref 38–126)
Anion gap: 5 (ref 5–15)
BUN: 17 mg/dL (ref 6–20)
CO2: 26 mmol/L (ref 22–32)
Calcium: 9.2 mg/dL (ref 8.9–10.3)
Chloride: 110 mmol/L (ref 98–111)
Creatinine: 0.63 mg/dL (ref 0.44–1.00)
GFR, Estimated: >60 mL/min (ref >60)
Glucose, Bld: 107 mg/dL — ABNORMAL HIGH (ref 70–99)
Potassium: 3.5 mmol/L (ref 3.5–5.1)
Sodium: 141 mmol/L (ref 135–145)
Total Bilirubin: 0.6 mg/dL (ref 0.3–1.2)
Total Protein: 7.2 g/dL (ref 6.5–8.1)

## 2021-10-23 MED ORDER — GOSERELIN ACETATE 3.6 MG ~~LOC~~ IMPL
3.6000 mg | DRUG_IMPLANT | Freq: Once | SUBCUTANEOUS | Status: AC
  Administered 2021-10-23: 3.6 mg via SUBCUTANEOUS
  Filled 2021-10-23: qty 3.6

## 2021-10-23 MED ORDER — SODIUM CHLORIDE 0.9 % IV SOLN: Freq: Once | INTRAVENOUS | Status: AC

## 2021-10-23 MED ORDER — ZOLEDRONIC ACID 4 MG/100ML IV SOLN
4.0000 mg | Freq: Once | INTRAVENOUS | Status: AC
  Administered 2021-10-23: 4 mg via INTRAVENOUS
  Filled 2021-10-23: qty 100

## 2021-10-23 NOTE — Assessment & Plan Note (Addendum)
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% 09/25/2020: Right breast biopsy: DCIS grade 1, ER/PR positive 10/06/2020 breast MRI showeda 11 cm area of malignancy, with a second 1.9 cm mass, and skin thickening concerning for inflammatory breast carcinoma; at least 6 abnormal lymph nodes noted 05/13/2022CT chestshows clear lungs and no obvious liver involvement but multiple lytic bone lesions in addition to the findings and MRI above 05/21/2022spine MRI: Shows widely scattered thoracic and lumbar vertebral body metastases 10/24/2020: 2 cycles of dose dense Adriamycin and Cytoxan discontinued because of rising LFTs 12/01/2020: MRI abdomen: No evidence of metastatic disease in the liver 01/31/2021:Left mastectomy: Residual multifocal IDC 2 cm with intermediate grade DCIS, 15/15 lymph nodes positive, margins negative, lymphovascular space invasion present, ER 60%, PR 10%, HER2 negative, Ki-67 15% Right mastectomy: Fibrocystic change no residual cancer or DCIS  Current treatment: Anastrozole with goserelin and Zometa and Verzenio  Anastrozole toxicities: Tolerating it very well Abemaciclib toxicities: Very occasional loose stools  08/14/2021: CT CAP and bone scan: Stable bone metastases I sent a referral to Dr. Iran Planas for breast reconstruction. Follow-up in 3 months with scans

## 2021-10-23 NOTE — Patient Instructions (Addendum)
Zoledronic Acid Injection (Cancer) What is this medication? ZOLEDRONIC ACID (ZOE le dron ik AS id) treats high calcium levels in the blood caused by cancer. It may also be used with chemotherapy to treat weakened bones caused by cancer. It works by slowing down the release of calcium from bones. This lowers calcium levels in your blood. It also makes your bones stronger and less likely to break (fracture). It belongs to a group of medications called bisphosphonates. This medicine may be used for other purposes; ask your health care provider or pharmacist if you have questions. COMMON BRAND NAME(S): Zometa, Zometa Powder What should I tell my care team before I take this medication? They need to know if you have any of these conditions: Dehydration Dental disease Kidney disease Liver disease Low levels of calcium in the blood Lung or breathing disease, such as asthma Receiving steroids, such as dexamethasone or prednisone An unusual or allergic reaction to zoledronic acid, other medications, foods, dyes, or preservatives Pregnant or trying to get pregnant Breast-feeding How should I use this medication? This medication is injected into a vein. It is given by your care team in a hospital or clinic setting. Talk to your care team about the use of this medication in children. Special care may be needed. Overdosage: If you think you have taken too much of this medicine contact a poison control center or emergency room at once. NOTE: This medicine is only for you. Do not share this medicine with others. What if I miss a dose? Keep appointments for follow-up doses. It is important not to miss your dose. Call your care team if you are unable to keep an appointment. What may interact with this medication? Certain antibiotics given by injection Diuretics, such as bumetanide, furosemide NSAIDs, medications for pain and inflammation, such as ibuprofen or naproxen Teriparatide Thalidomide This list  may not describe all possible interactions. Give your health care provider a list of all the medicines, herbs, non-prescription drugs, or dietary supplements you use. Also tell them if you smoke, drink alcohol, or use illegal drugs. Some items may interact with your medicine. What should I watch for while using this medication? Visit your care team for regular checks on your progress. It may be some time before you see the benefit from this medication. Some people who take this medication have severe bone, joint, or muscle pain. This medication may also increase your risk for jaw problems or a broken thigh bone. Tell your care team right away if you have severe pain in your jaw, bones, joints, or muscles. Tell you care team if you have any pain that does not go away or that gets worse. Tell your dentist and dental surgeon that you are taking this medication. You should not have major dental surgery while on this medication. See your dentist to have a dental exam and fix any dental problems before starting this medication. Take good care of your teeth while on this medication. Make sure you see your dentist for regular follow-up appointments. You should make sure you get enough calcium and vitamin D while you are taking this medication. Discuss the foods you eat and the vitamins you take with your care team. Check with your care team if you have severe diarrhea, nausea, and vomiting, or if you sweat a lot. The loss of too much body fluid may make it dangerous for you to take this medication. You may need bloodwork while taking this medication. Talk to your care team if   you wish to become pregnant or think you might be pregnant. This medication can cause serious birth defects. What side effects may I notice from receiving this medication? Side effects that you should report to your care team as soon as possible: Allergic reactions--skin rash, itching, hives, swelling of the face, lips, tongue, or  throat Kidney injury--decrease in the amount of urine, swelling of the ankles, hands, or feet Low calcium level--muscle pain or cramps, confusion, tingling, or numbness in the hands or feet Osteonecrosis of the jaw--pain, swelling, or redness in the mouth, numbness of the jaw, poor healing after dental work, unusual discharge from the mouth, visible bones in the mouth Severe bone, joint, or muscle pain Side effects that usually do not require medical attention (report to your care team if they continue or are bothersome): Constipation Fatigue Fever Loss of appetite Nausea Stomach pain This list may not describe all possible side effects. Call your doctor for medical advice about side effects. You may report side effects to FDA at 1-800-FDA-1088. Where should I keep my medication? This medication is given in a hospital or clinic. It will not be stored at home. NOTE: This sheet is a summary. It may not cover all possible information. If you have questions about this medicine, talk to your doctor, pharmacist, or health care provider.  2023 Elsevier/Gold Standard (2021-07-13 00:00:00)  Goserelin injection What is this medication? GOSERELIN (GOE se rel in) is similar to a hormone found in the body. It lowers the amount of sex hormones that the body makes. Men will have lower testosterone levels and women will have lower estrogen levels while taking this medicine. In men, this medicine is used to treat prostate cancer; the injection is either given once per month or once every 12 weeks. A once per month injection (only) is used to treat women with endometriosis, dysfunctional uterine bleeding, or advanced breast cancer. This medicine may be used for other purposes; ask your health care provider or pharmacist if you have questions. COMMON BRAND NAME(S): Zoladex, Zoladex 23-Month What should I tell my care team before I take this medication? They need to know if you have any of these  conditions: bone problems diabetes heart disease history of irregular heartbeat an unusual or allergic reaction to goserelin, other medicines, foods, dyes, or preservatives pregnant or trying to get pregnant breast-feeding How should I use this medication? This medicine is for injection under the skin. It is given by a health care professional in a hospital or clinic setting. Talk to your pediatrician regarding the use of this medicine in children. Special care may be needed. Overdosage: If you think you have taken too much of this medicine contact a poison control center or emergency room at once. NOTE: This medicine is only for you. Do not share this medicine with others. What if I miss a dose? It is important not to miss your dose. Call your doctor or health care professional if you are unable to keep an appointment. What may interact with this medication? Do not take this medicine with any of the following medications: cisapride dronedarone pimozide thioridazine This medicine may also interact with the following medications: other medicines that prolong the QT interval (an abnormal heart rhythm) This list may not describe all possible interactions. Give your health care provider a list of all the medicines, herbs, non-prescription drugs, or dietary supplements you use. Also tell them if you smoke, drink alcohol, or use illegal drugs. Some items may interact with your medicine.  What should I watch for while using this medication? Visit your doctor or health care provider for regular checks on your progress. Your symptoms may appear to get worse during the first weeks of this therapy. Tell your doctor or healthcare provider if your symptoms do not start to get better or if they get worse after this time. Your bones may get weaker if you take this medicine for a long time. If you smoke or frequently drink alcohol you may increase your risk of bone loss. A family history of osteoporosis,  chronic use of drugs for seizures (convulsions), or corticosteroids can also increase your risk of bone loss. Talk to your doctor about how to keep your bones strong. This medicine should stop regular monthly menstruation in women. Tell your doctor if you continue to menstruate. Women should not become pregnant while taking this medicine or for 12 weeks after stopping this medicine. Women should inform their doctor if they wish to become pregnant or think they might be pregnant. There is a potential for serious side effects to an unborn child. Talk to your health care professional or pharmacist for more information. Do not breast-feed an infant while taking this medicine. Men should inform their doctors if they wish to father a child. This medicine may lower sperm counts. Talk to your health care professional or pharmacist for more information. This medicine may increase blood sugar. Ask your healthcare provider if changes in diet or medicines are needed if you have diabetes. What side effects may I notice from receiving this medication? Side effects that you should report to your doctor or health care professional as soon as possible: allergic reactions like skin rash, itching or hives, swelling of the face, lips, or tongue bone pain breathing problems changes in vision chest pain feeling faint or lightheaded, falls fever, chills pain, swelling, warmth in the leg pain, tingling, numbness in the hands or feet signs and symptoms of high blood sugar such as being more thirsty or hungry or having to urinate more than normal. You may also feel very tired or have blurry vision signs and symptoms of low blood pressure like dizziness; feeling faint or lightheaded, falls; unusually weak or tired stomach pain swelling of the ankles, feet, hands trouble passing urine or change in the amount of urine unusually high or low blood pressure unusually weak or tired Side effects that usually do not require  medical attention (report to your doctor or health care professional if they continue or are bothersome): change in sex drive or performance changes in breast size in both males and females changes in emotions or moods headache hot flashes irritation at site where injected loss of appetite skin problems like acne, dry skin vaginal dryness This list may not describe all possible side effects. Call your doctor for medical advice about side effects. You may report side effects to FDA at 1-800-FDA-1088. Where should I keep my medication? This drug is given in a hospital or clinic and will not be stored at home. NOTE: This sheet is a summary. It may not cover all possible information. If you have questions about this medicine, talk to your doctor, pharmacist, or health care provider.  2023 Elsevier/Gold Standard (2018-09-18 00:00:00)

## 2021-10-23 NOTE — Progress Notes (Signed)
Per MD request RN successfully faxed referral to Dr. Thimmappa ([phone]). 

## 2021-10-25 ENCOUNTER — Telehealth: Payer: Self-pay | Admitting: Hematology and Oncology

## 2021-10-25 NOTE — Telephone Encounter (Signed)
Scheduled appointment per 5/23 los. Patient is aware.

## 2021-10-26 ENCOUNTER — Encounter (HOSPITAL_COMMUNITY): Payer: Self-pay

## 2021-10-26 ENCOUNTER — Other Ambulatory Visit: Payer: Self-pay

## 2021-10-26 ENCOUNTER — Emergency Department (HOSPITAL_COMMUNITY)
Admission: EM | Admit: 2021-10-26 | Discharge: 2021-10-26 | Disposition: A | Discharge status: Home / Self Care | Payer: Medicaid Other | Attending: Emergency Medicine | Admitting: Emergency Medicine

## 2021-10-26 DIAGNOSIS — R112 Nausea with vomiting, unspecified: Secondary | ICD-10-CM | POA: Diagnosis present

## 2021-10-26 DIAGNOSIS — R197 Diarrhea, unspecified: Secondary | ICD-10-CM | POA: Insufficient documentation

## 2021-10-26 DIAGNOSIS — Z853 Personal history of malignant neoplasm of breast: Secondary | ICD-10-CM | POA: Insufficient documentation

## 2021-10-26 DIAGNOSIS — J45909 Unspecified asthma, uncomplicated: Secondary | ICD-10-CM | POA: Diagnosis not present

## 2021-10-26 LAB — COMPREHENSIVE METABOLIC PANEL
ALT: 55 U/L — ABNORMAL HIGH (ref 0–44)
AST: 37 U/L (ref 15–41)
Albumin: 5 g/dL (ref 3.5–5.0)
Alkaline Phosphatase: 35 U/L — ABNORMAL LOW (ref 38–126)
Anion gap: 8 (ref 5–15)
BUN: 23 mg/dL — ABNORMAL HIGH (ref 6–20)
CO2: 25 mmol/L (ref 22–32)
Calcium: 9.3 mg/dL (ref 8.9–10.3)
Chloride: 110 mmol/L (ref 98–111)
Creatinine, Ser: 0.71 mg/dL (ref 0.44–1.00)
GFR, Estimated: >60 mL/min (ref >60)
Glucose, Bld: 116 mg/dL — ABNORMAL HIGH (ref 70–99)
Potassium: 3.7 mmol/L (ref 3.5–5.1)
Sodium: 143 mmol/L (ref 135–145)
Total Bilirubin: 1 mg/dL (ref 0.3–1.2)
Total Protein: 8.6 g/dL — ABNORMAL HIGH (ref 6.5–8.1)

## 2021-10-26 LAB — CBC
HCT: 35.3 % — ABNORMAL LOW (ref 36.0–46.0)
Hemoglobin: 13 g/dL (ref 12.0–15.0)
MCH: 35.8 pg — ABNORMAL HIGH (ref 26.0–34.0)
MCHC: 36.8 g/dL — ABNORMAL HIGH (ref 30.0–36.0)
MCV: 97.2 fL (ref 80.0–100.0)
Platelets: 206 10*3/uL (ref 150–400)
RBC: 3.63 MIL/uL — ABNORMAL LOW (ref 3.87–5.11)
RDW: 13.2 % (ref 11.5–15.5)
WBC: 6.1 10*3/uL (ref 4.0–10.5)
nRBC: 0 % (ref 0.0–0.2)

## 2021-10-26 LAB — URINALYSIS, ROUTINE W REFLEX MICROSCOPIC
Bilirubin Urine: NEGATIVE
Glucose, UA: NEGATIVE mg/dL
Hgb urine dipstick: NEGATIVE
Ketones, ur: NEGATIVE mg/dL
Leukocytes,Ua: NEGATIVE
Nitrite: NEGATIVE
Protein, ur: NEGATIVE mg/dL
Specific Gravity, Urine: 1.018 (ref 1.005–1.030)
pH: 5 (ref 5.0–8.0)

## 2021-10-26 LAB — I-STAT BETA HCG BLOOD, ED (MC, WL, AP ONLY): I-stat hCG, quantitative: <5 m[IU]/mL (ref <5)

## 2021-10-26 LAB — LIPASE, BLOOD: Lipase: 28 U/L (ref 11–51)

## 2021-10-26 MED ORDER — ONDANSETRON HCL 4 MG/2ML IJ SOLN
4.0000 mg | Freq: Once | INTRAMUSCULAR | Status: AC
  Administered 2021-10-26: 4 mg via INTRAVENOUS
  Filled 2021-10-26: qty 2

## 2021-10-26 MED ORDER — LACTATED RINGERS IV BOLUS
1000.0000 mL | Freq: Once | INTRAVENOUS | Status: AC
  Administered 2021-10-26: 1000 mL via INTRAVENOUS

## 2021-10-26 MED ORDER — SODIUM CHLORIDE 0.9 % IV BOLUS
1000.0000 mL | Freq: Once | INTRAVENOUS | Status: AC
  Administered 2021-10-26: 1000 mL via INTRAVENOUS

## 2021-10-26 MED ORDER — ONDANSETRON 4 MG PO TBDP: 4.0000 mg | ORAL_TABLET | Freq: Three times a day (TID) | ORAL | 0 refills | Status: DC | PRN

## 2021-10-26 NOTE — ED Provider Notes (Signed)
[facility] DEPT Provider Note   CSN: [id number] Arrival date & time: 10/26/21  1711     History  Chief Complaint  Patient presents with   Emesis   Back Pain   Diarrhea    Gabriela Sullivan is a 31 y.o. female.   Emesis Associated symptoms: diarrhea   Back Pain Diarrhea Associated symptoms: vomiting   Patient presents with nausea vomiting diarrhea.  States she is worried she has food poisoning.  Minimal abdominal pain.  Feels a little lightheaded.  No definite sick contacts or known exposures to food.  States there was a little blood in the emesis but that is typical for her when she throws up.  Does have history of breast cancer.  Not currently on chemotherapy.   Past Medical History:  Diagnosis Date   Asthma    remote   Breast cancer ([place])    Elevated liver enzymes    Family history of breast cancer    Family history of ovarian cancer    Family history of prostate cancer    Family history of stomach cancer    Hepatic steatosis    Hyperglycemia    A1c 7.6% 12/19/20   Migraines    PCOS (polycystic ovarian syndrome)    Pre-diabetes     Home Medications Prior to Admission medications   Medication Sig Start Date End Date Taking? Authorizing Provider  abemaciclib (VERZENIO) 150 MG tablet Take 1 tablet (150 mg total) by mouth 2 (two) times daily. Swallow tablets whole. Do not chew, crush, or split tablets before swallowing. 09/18/21  Yes Nicholas Lose, MD  anastrozole (ARIMIDEX) 1 MG tablet Take 1 tablet (1 mg total) by mouth daily. 04/25/21  Yes Nicholas Lose, MD  calcium carbonate (TUMS - DOSED IN MG ELEMENTAL CALCIUM) 500 MG chewable tablet Chew 2 tablets by mouth every other day.   Yes [provider]  cholecalciferol (VITAMIN D3) 25 MCG (1000 UNIT) tablet Take 1,000 Units by mouth daily.   Yes [provider]  Goserelin Acetate (ZOLADEX [place]) Inject 1 Dose into the skin every 30 (thirty) days. 10/24/21 was her last dose   Yes  [provider]  omeprazole (PRILOSEC) 20 MG capsule Take 1 capsule (20 mg total) by mouth daily. 08/04/21  Yes Antonietta Breach, PA-C  ondansetron (ZOFRAN-ODT) 4 MG disintegrating tablet Take 1 tablet (4 mg total) by mouth every 8 (eight) hours as needed for nausea or vomiting. 10/26/21  Yes Davonna Belling, MD  potassium chloride SA (KLOR-CON M) 20 MEQ tablet Take 1 tablet (20 mEq total) by mouth 2 (two) times daily. 07/24/21  Yes Nicholas Lose, MD  Semaglutide (OZEMPIC, 0.25 OR 0.5 MG/DOSE, [place]) Inject 0.25 mg into the skin once a week. mondays   Yes Alan Ripper, PA  prochlorperazine (COMPAZINE) 10 MG tablet Take 1 tablet (10 mg total) by mouth every 6 (six) hours as needed (Nausea or vomiting). 10/04/20 12/21/20  Magrinat, Virgie Dad, MD      Allergies    Patient has no known allergies.    Review of Systems   Review of Systems  Gastrointestinal:  Positive for diarrhea and vomiting.  Musculoskeletal:  Positive for back pain.   Physical Exam Updated Vital Signs BP (!) 141/76   Pulse (!) 118   Temp (!) 100.4 F (38 C) (Oral)   Resp 18   Ht 5' (1.524 m)   Wt 81.6 kg   SpO2 99%   BMI 35.15 kg/m  Physical Exam Vitals and nursing  note reviewed.  Eyes:     Pupils: Pupils are equal, round, and reactive to light.  Cardiovascular:     Rate and Rhythm: Tachycardia present.  Pulmonary:     Effort: Pulmonary effort is normal.  Abdominal:     Tenderness: There is no abdominal tenderness.  Musculoskeletal:        General: No tenderness.  Skin:    General: Skin is warm.     Capillary Refill: Capillary refill takes less than 2 seconds.  Neurological:     Mental Status: She is alert and oriented to person, place, and time.    ED Results / Procedures / Treatments   Labs (all labs ordered are listed, but only abnormal results are displayed) Labs Reviewed  COMPREHENSIVE METABOLIC PANEL - Abnormal; Notable for the following components:      Result Value   Glucose, Bld 116 (*)     BUN 23 (*)    Total Protein 8.6 (*)    ALT 55 (*)    Alkaline Phosphatase 35 (*)    All other components within normal limits  CBC - Abnormal; Notable for the following components:   RBC 3.63 (*)    HCT 35.3 (*)    MCH 35.8 (*)    MCHC 36.8 (*)    All other components within normal limits  LIPASE, BLOOD  URINALYSIS, ROUTINE W REFLEX MICROSCOPIC  I-STAT BETA HCG BLOOD, ED ([facility], [facility], [facility] ONLY)    EKG None  Radiology No results found.  Procedures Procedures    Medications Ordered in ED Medications  ondansetron (ZOFRAN) injection 4 mg (4 mg Intravenous Given 10/26/21 1831)  sodium chloride 0.9 % bolus 1,000 mL (0 mLs Intravenous Stopped 10/26/21 2055)  lactated ringers bolus 1,000 mL (0 mLs Intravenous Stopped 10/26/21 2213)    ED Course/ Medical Decision Making/ A&P                           Medical Decision Making Amount and/or Complexity of Data Reviewed Labs: ordered.  Risk Prescription drug management.   Patient with nausea vomiting diarrhea.  Unknown source.  Benign exam.  Mild tachycardia but patient feels better after IV fluids.  Tolerating orals.  Eager to go home.  Do not appear to need CT scan.  We will give antiemetics.  No severe colitis.  Will discharge home.        Final Clinical Impression(s) / ED Diagnoses Final diagnoses:  Nausea vomiting and diarrhea    Rx / DC Orders ED Discharge Orders          Ordered    ondansetron (ZOFRAN-ODT) 4 MG disintegrating tablet  Every 8 hours PRN        10/26/21 2200              Davonna Belling, MD 10/26/21 2314

## 2021-10-26 NOTE — ED Triage Notes (Signed)
Patient c/o upper, mid, and lower back pain, vomiting, and diarrhea that started today.

## 2021-11-07 ENCOUNTER — Other Ambulatory Visit (HOSPITAL_COMMUNITY): Payer: Self-pay

## 2021-11-07 DIAGNOSIS — Z923 Personal history of irradiation: Secondary | ICD-10-CM | POA: Insufficient documentation

## 2021-11-13 ENCOUNTER — Other Ambulatory Visit (HOSPITAL_COMMUNITY): Payer: Self-pay

## 2021-11-20 ENCOUNTER — Other Ambulatory Visit: Payer: Self-pay

## 2021-11-20 ENCOUNTER — Inpatient Hospital Stay: Payer: Medicaid Other | Attending: Oncology

## 2021-11-20 VITALS — BP 134/76 | HR 77 | Temp 98.7°F | Resp 18

## 2021-11-20 DIAGNOSIS — Z5111 Encounter for antineoplastic chemotherapy: Secondary | ICD-10-CM | POA: Insufficient documentation

## 2021-11-20 DIAGNOSIS — Z95828 Presence of other vascular implants and grafts: Secondary | ICD-10-CM

## 2021-11-20 DIAGNOSIS — Z170 Estrogen receptor positive status [ER+]: Secondary | ICD-10-CM | POA: Diagnosis not present

## 2021-11-20 DIAGNOSIS — C50412 Malignant neoplasm of upper-outer quadrant of left female breast: Secondary | ICD-10-CM | POA: Diagnosis present

## 2021-11-20 MED ORDER — GOSERELIN ACETATE 3.6 MG ~~LOC~~ IMPL
3.6000 mg | DRUG_IMPLANT | Freq: Once | SUBCUTANEOUS | Status: AC
  Administered 2021-11-20: 3.6 mg via SUBCUTANEOUS
  Filled 2021-11-20: qty 3.6

## 2021-11-20 MED ORDER — HEPARIN SOD (PORK) LOCK FLUSH 100 UNIT/ML IV SOLN
500.0000 [IU] | Freq: Once | INTRAVENOUS | Status: AC
  Administered 2021-11-20: 500 [IU]

## 2021-11-20 MED ORDER — SODIUM CHLORIDE 0.9% FLUSH
10.0000 mL | Freq: Once | INTRAVENOUS | Status: AC
  Administered 2021-11-20: 10 mL

## 2021-12-03 ENCOUNTER — Ambulatory Visit: Payer: Medicaid Other | Attending: General Surgery

## 2021-12-03 VITALS — Wt 182.4 lb

## 2021-12-03 DIAGNOSIS — Z483 Aftercare following surgery for neoplasm: Secondary | ICD-10-CM | POA: Insufficient documentation

## 2021-12-03 NOTE — Therapy (Signed)
OUTPATIENT PHYSICAL THERAPY SOZO SCREENING NOTE   Patient Name: Gabriela Sullivan MRN: [id number] DOB:22-Apr-1991, 31 y.o., female Today's Date: 12/03/2021  PCP: [name], MD REFERRING PROVIDER: Rolm Bookbinder, MD   PT End of Session - 12/03/21 1029     Visit Number 8   # unchanged due to screen only   PT Start Time 1027    PT Stop Time 1036    PT Time Calculation (min) 9 min    Activity Tolerance Patient tolerated treatment well    Behavior During Therapy WFL for tasks assessed/performed             Past Medical History:  Diagnosis Date   Asthma    remote   Breast cancer ([place])    Elevated liver enzymes    Family history of breast cancer    Family history of ovarian cancer    Family history of prostate cancer    Family history of stomach cancer    Hepatic steatosis    Hyperglycemia    A1c 7.6% 12/19/20   Migraines    PCOS (polycystic ovarian syndrome)    Pre-diabetes    Past Surgical History:  Procedure Laterality Date   MASTECTOMY W/ SENTINEL NODE BIOPSY Right 01/31/2021   Procedure: RIGHT SIMPLE MASTECTOMY;  Surgeon: Erroll Luna, MD;  Location: [facility];  Service: General;  Laterality: Right;   MODIFIED MASTECTOMY Left 01/31/2021   Procedure: LEFT MODIFIED RADICAL MASTECTOMY;  Surgeon: Erroll Luna, MD;  Location: [facility];  Service: General;  Laterality: Left;   NO PAST SURGERIES     PORTACATH PLACEMENT Right 10/18/2020   Procedure: INSERTION PORT-A-CATH;  Surgeon: Erroll Luna, MD;  Location: [facility];  Service: General;  Laterality: Right;   Patient Active Problem List   Diagnosis Date Noted   Left breast cancer with T3 tumor, >5 cm in greatest dimension ([place]) 01/31/2021   Bone metastases 12/28/2020   Port-A-Cath in place 12/06/2020   Genetic testing 11/25/2020   Goals of care, counseling/discussion 10/25/2020   Lytic bone lesions on xray 10/16/2020   Family history of ovarian cancer    Family history of prostate cancer     Family history of breast cancer    Family history of stomach cancer    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 It is ER/PR positive and HER2 negative with a Ki67 of 15%. She underwent neoadjuvant chemotherapy beginning 10/24/2020 for 2 rounds which was stopped due to elevated LFTs. She had a bilateral mastectomy with left axillary node dissection on 01/31/2021 with 15/15 nodes positive. She started Anastrozole on 12/06/2020. 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   PRECAUTIONS: left UE Lymphedema risk, None  SUBJECTIVE: Pt returns for her 3 month L-Dex screen.   PAIN:  Are you having pain? No  SOZO SCREENING: Patient was assessed today using the SOZO machine to determine the lymphedema index score. This was compared to her baseline score. It was determined that she is within the recommended range when compared to her baseline and no further action is needed at this time.  She will continue SOZO screenings. These are done every 3 months for 2 years post operatively followed by every 6 months for 2 years, and then annually.  Plan: Cont every 3 month L-Dex screens for up to 2 years from her Lt ALND (~02/01/2023)    Otelia Limes, PTA 12/03/2021, 10:36 AM

## 2021-12-05 ENCOUNTER — Other Ambulatory Visit (HOSPITAL_COMMUNITY): Payer: Self-pay

## 2021-12-12 ENCOUNTER — Other Ambulatory Visit (HOSPITAL_COMMUNITY): Payer: Self-pay

## 2021-12-13 ENCOUNTER — Other Ambulatory Visit (HOSPITAL_COMMUNITY): Payer: Self-pay

## 2021-12-18 ENCOUNTER — Inpatient Hospital Stay: Payer: Medicaid Other | Attending: Oncology

## 2021-12-18 ENCOUNTER — Other Ambulatory Visit: Payer: Self-pay

## 2021-12-18 VITALS — BP 133/76 | HR 76 | Temp 98.7°F | Resp 20

## 2021-12-18 DIAGNOSIS — C50412 Malignant neoplasm of upper-outer quadrant of left female breast: Secondary | ICD-10-CM | POA: Insufficient documentation

## 2021-12-18 DIAGNOSIS — Z5111 Encounter for antineoplastic chemotherapy: Secondary | ICD-10-CM | POA: Insufficient documentation

## 2021-12-18 DIAGNOSIS — Z170 Estrogen receptor positive status [ER+]: Secondary | ICD-10-CM | POA: Insufficient documentation

## 2021-12-18 DIAGNOSIS — Z95828 Presence of other vascular implants and grafts: Secondary | ICD-10-CM

## 2021-12-18 MED ORDER — GOSERELIN ACETATE 3.6 MG ~~LOC~~ IMPL
3.6000 mg | DRUG_IMPLANT | Freq: Once | SUBCUTANEOUS | Status: AC
  Administered 2021-12-18: 3.6 mg via SUBCUTANEOUS
  Filled 2021-12-18: qty 3.6

## 2021-12-18 MED ORDER — SODIUM CHLORIDE 0.9% FLUSH
10.0000 mL | Freq: Once | INTRAVENOUS | Status: AC
  Administered 2021-12-18: 10 mL

## 2021-12-18 MED ORDER — HEPARIN SOD (PORK) LOCK FLUSH 100 UNIT/ML IV SOLN
500.0000 [IU] | Freq: Once | INTRAVENOUS | Status: AC
  Administered 2021-12-18: 500 [IU]

## 2021-12-24 ENCOUNTER — Encounter: Payer: Self-pay | Admitting: Gastroenterology

## 2022-01-01 ENCOUNTER — Other Ambulatory Visit (HOSPITAL_COMMUNITY): Payer: Self-pay

## 2022-01-09 ENCOUNTER — Other Ambulatory Visit (HOSPITAL_COMMUNITY): Payer: Self-pay

## 2022-01-10 ENCOUNTER — Inpatient Hospital Stay: Payer: Medicaid Other | Attending: Oncology

## 2022-01-10 ENCOUNTER — Other Ambulatory Visit: Payer: Self-pay

## 2022-01-10 DIAGNOSIS — Z95828 Presence of other vascular implants and grafts: Secondary | ICD-10-CM

## 2022-01-10 DIAGNOSIS — C50412 Malignant neoplasm of upper-outer quadrant of left female breast: Secondary | ICD-10-CM | POA: Insufficient documentation

## 2022-01-10 DIAGNOSIS — Z170 Estrogen receptor positive status [ER+]: Secondary | ICD-10-CM | POA: Insufficient documentation

## 2022-01-10 DIAGNOSIS — Z5111 Encounter for antineoplastic chemotherapy: Secondary | ICD-10-CM | POA: Diagnosis present

## 2022-01-10 DIAGNOSIS — Z9013 Acquired absence of bilateral breasts and nipples: Secondary | ICD-10-CM | POA: Diagnosis not present

## 2022-01-10 DIAGNOSIS — Z79811 Long term (current) use of aromatase inhibitors: Secondary | ICD-10-CM | POA: Insufficient documentation

## 2022-01-10 LAB — CMP (CANCER CENTER ONLY)
ALT: 95 U/L — ABNORMAL HIGH (ref 0–44)
AST: 47 U/L — ABNORMAL HIGH (ref 15–41)
Albumin: 4.2 g/dL (ref 3.5–5.0)
Alkaline Phosphatase: 31 U/L — ABNORMAL LOW (ref 38–126)
Anion gap: 4 — ABNORMAL LOW (ref 5–15)
BUN: 16 mg/dL (ref 6–20)
CO2: 29 mmol/L (ref 22–32)
Calcium: 8.8 mg/dL — ABNORMAL LOW (ref 8.9–10.3)
Chloride: 107 mmol/L (ref 98–111)
Creatinine: 0.55 mg/dL (ref 0.44–1.00)
GFR, Estimated: >60 mL/min (ref >60)
Glucose, Bld: 145 mg/dL — ABNORMAL HIGH (ref 70–99)
Potassium: 3.6 mmol/L (ref 3.5–5.1)
Sodium: 140 mmol/L (ref 135–145)
Total Bilirubin: 0.4 mg/dL (ref 0.3–1.2)
Total Protein: 7.2 g/dL (ref 6.5–8.1)

## 2022-01-10 LAB — CBC WITH DIFFERENTIAL (CANCER CENTER ONLY)
Abs Immature Granulocytes: 0.01 10*3/uL (ref 0.00–0.07)
Basophils Absolute: 0.1 10*3/uL (ref 0.0–0.1)
Basophils Relative: 2 %
Eosinophils Absolute: 0.1 10*3/uL (ref 0.0–0.5)
Eosinophils Relative: 2 %
HCT: 31.8 % — ABNORMAL LOW (ref 36.0–46.0)
Hemoglobin: 11.8 g/dL — ABNORMAL LOW (ref 12.0–15.0)
Immature Granulocytes: 0 %
Lymphocytes Relative: 31 %
Lymphs Abs: 1.2 10*3/uL (ref 0.7–4.0)
MCH: 35.1 pg — ABNORMAL HIGH (ref 26.0–34.0)
MCHC: 37.1 g/dL — ABNORMAL HIGH (ref 30.0–36.0)
MCV: 94.6 fL (ref 80.0–100.0)
Monocytes Absolute: 0.2 10*3/uL (ref 0.1–1.0)
Monocytes Relative: 6 %
Neutro Abs: 2.2 10*3/uL (ref 1.7–7.7)
Neutrophils Relative %: 59 %
Platelet Count: 258 10*3/uL (ref 150–400)
RBC: 3.36 MIL/uL — ABNORMAL LOW (ref 3.87–5.11)
RDW: 12.5 % (ref 11.5–15.5)
WBC Count: 3.8 10*3/uL — ABNORMAL LOW (ref 4.0–10.5)
nRBC: 0 % (ref 0.0–0.2)

## 2022-01-10 MED ORDER — SODIUM CHLORIDE 0.9% FLUSH
10.0000 mL | Freq: Once | INTRAVENOUS | Status: AC
  Administered 2022-01-10: 10 mL

## 2022-01-10 MED ORDER — HEPARIN SOD (PORK) LOCK FLUSH 100 UNIT/ML IV SOLN
500.0000 [IU] | Freq: Once | INTRAVENOUS | Status: AC
  Administered 2022-01-10: 500 [IU]

## 2022-01-11 ENCOUNTER — Encounter (HOSPITAL_COMMUNITY)
Admission: RE | Admit: 2022-01-11 | Discharge: 2022-01-11 | Disposition: A | Discharge status: Home / Self Care | Payer: Medicaid Other | Source: Ambulatory Visit | Attending: Hematology and Oncology | Admitting: Hematology and Oncology

## 2022-01-11 ENCOUNTER — Ambulatory Visit (HOSPITAL_COMMUNITY)
Admission: RE | Admit: 2022-01-11 | Discharge: 2022-01-11 | Disposition: A | Discharge status: Home / Self Care | Payer: Medicaid Other | Source: Ambulatory Visit | Attending: Hematology and Oncology | Admitting: Hematology and Oncology

## 2022-01-11 DIAGNOSIS — C7951 Secondary malignant neoplasm of bone: Secondary | ICD-10-CM | POA: Insufficient documentation

## 2022-01-11 DIAGNOSIS — Z170 Estrogen receptor positive status [ER+]: Secondary | ICD-10-CM | POA: Insufficient documentation

## 2022-01-11 DIAGNOSIS — C50412 Malignant neoplasm of upper-outer quadrant of left female breast: Secondary | ICD-10-CM

## 2022-01-11 MED ORDER — SODIUM CHLORIDE (PF) 0.9 % IJ SOLN
INTRAMUSCULAR | Status: AC
  Filled 2022-01-11: qty 50

## 2022-01-11 MED ORDER — TECHNETIUM TC 99M MEDRONATE IV KIT: 2020.0000 | PACK | Freq: Once | INTRAVENOUS | Status: DC

## 2022-01-11 MED ORDER — HEPARIN SOD (PORK) LOCK FLUSH 100 UNIT/ML IV SOLN
500.0000 [IU] | Freq: Once | INTRAVENOUS | Status: AC
  Administered 2022-01-11: 500 [IU] via INTRAVENOUS

## 2022-01-11 MED ORDER — TECHNETIUM TC 99M MEDRONATE IV KIT
20.1000 | PACK | Freq: Once | INTRAVENOUS | Status: AC
  Administered 2022-01-11: 20.1 via INTRAVENOUS

## 2022-01-11 MED ORDER — HEPARIN SOD (PORK) LOCK FLUSH 100 UNIT/ML IV SOLN
INTRAVENOUS | Status: AC
  Filled 2022-01-11: qty 5

## 2022-01-11 MED ORDER — IOHEXOL 300 MG/ML  SOLN
100.0000 mL | Freq: Once | INTRAMUSCULAR | Status: AC | PRN
  Administered 2022-01-11: 100 mL via INTRAVENOUS

## 2022-01-15 ENCOUNTER — Other Ambulatory Visit: Payer: Self-pay

## 2022-01-15 ENCOUNTER — Other Ambulatory Visit: Payer: Medicaid Other

## 2022-01-15 ENCOUNTER — Ambulatory Visit: Payer: Medicaid Other | Admitting: Pharmacist

## 2022-01-15 ENCOUNTER — Inpatient Hospital Stay (HOSPITAL_BASED_OUTPATIENT_CLINIC_OR_DEPARTMENT_OTHER): Payer: Medicaid Other | Admitting: Hematology and Oncology

## 2022-01-15 ENCOUNTER — Inpatient Hospital Stay: Payer: Medicaid Other

## 2022-01-15 DIAGNOSIS — Z170 Estrogen receptor positive status [ER+]: Secondary | ICD-10-CM

## 2022-01-15 DIAGNOSIS — C50412 Malignant neoplasm of upper-outer quadrant of left female breast: Secondary | ICD-10-CM | POA: Diagnosis not present

## 2022-01-15 DIAGNOSIS — Z95828 Presence of other vascular implants and grafts: Secondary | ICD-10-CM

## 2022-01-15 DIAGNOSIS — Z5111 Encounter for antineoplastic chemotherapy: Secondary | ICD-10-CM | POA: Diagnosis not present

## 2022-01-15 MED ORDER — ALTEPLASE 2 MG IJ SOLR
2.0000 mg | Freq: Once | INTRAMUSCULAR | Status: AC
  Administered 2022-01-15: 2 mg

## 2022-01-15 MED ORDER — HEPARIN SOD (PORK) LOCK FLUSH 100 UNIT/ML IV SOLN
500.0000 [IU] | Freq: Once | INTRAVENOUS | Status: AC
  Administered 2022-01-15: 500 [IU]

## 2022-01-15 MED ORDER — GOSERELIN ACETATE 3.6 MG ~~LOC~~ IMPL
3.6000 mg | DRUG_IMPLANT | Freq: Once | SUBCUTANEOUS | Status: AC
  Administered 2022-01-15: 3.6 mg via SUBCUTANEOUS

## 2022-01-15 MED ORDER — ZOLEDRONIC ACID 4 MG/100ML IV SOLN
4.0000 mg | Freq: Once | INTRAVENOUS | Status: AC
  Administered 2022-01-15: 4 mg via INTRAVENOUS

## 2022-01-15 MED ORDER — SODIUM CHLORIDE 0.9% FLUSH
10.0000 mL | Freq: Once | INTRAVENOUS | Status: AC
  Administered 2022-01-15: 10 mL

## 2022-01-15 NOTE — Patient Instructions (Signed)

## 2022-01-15 NOTE — Progress Notes (Signed)
Patient Care Team: [name], MD as PCP - General (Family Medicine) Rockwell Germany, RN as Oncology Nurse Navigator Mauro Kaufmann, RN as Oncology Nurse Navigator Erroll Luna, MD as Consulting Physician (General Surgery) Kyung Rudd, MD as Consulting Physician (Radiation Oncology) Nicholas Lose, MD as Consulting Physician (Hematology and Oncology)  DIAGNOSIS:  Encounter Diagnosis  Name Primary?   Malignant neoplasm of upper-outer quadrant of left breast in female, estrogen receptor positive ([place])     SUMMARY OF ONCOLOGIC HISTORY: Oncology History  Ductal carcinoma in situ (DCIS) of right breast  09/29/2020 Initial Diagnosis   Ductal carcinoma in situ (DCIS) of right breast   10/04/2020 Cancer Staging   Staging form: Breast, AJCC 8th Edition - Clinical: Stage 0 (cTis (DCIS), cN0, cM0) - Signed by Chauncey Cruel, MD on 10/04/2020   11/25/2020 Genetic Testing   Negative genetic testing:  No pathogenic variants detected on the [employer] CancerNext-Expanded + RNAinsight panel. The report date is 11/25/2020.   The CancerNext-Expanded + RNAinsight gene panel offered by [employer] and includes sequencing and rearrangement analysis for the following 77 genes: AIP, ALK, APC, ATM, AXIN2, BAP1, BARD1, BLM, BMPR1A, BRCA1, BRCA2, BRIP1, CDC73, CDH1, CDK4, CDKN1B, CDKN2A, CHEK2, CTNNA1, DICER1, FANCC, FH, FLCN, GALNT12, KIF1B, LZTR1, MAX, MEN1, MET, MLH1, MSH2, MSH3, MSH6, MUTYH, NBN, NF1, NF2, NTHL1, PALB2, PHOX2B, PMS2, POT1, PRKAR1A, PTCH1, PTEN, RAD51C, RAD51D, RB1, RECQL, RET, SDHA, SDHAF2, SDHB, SDHC, SDHD, SMAD4, SMARCA4, SMARCB1, SMARCE1, STK11, SUFU, TMEM127, TP53, TSC1, TSC2, VHL and XRCC2 (sequencing and deletion/duplication); EGFR, EGLN1, HOXB13, KIT, MITF, PDGFRA, POLD1 and POLE (sequencing only); EPCAM and GREM1 (deletion/duplication only). RNA data is routinely analyzed for use in variant interpretation for all genes.   Malignant neoplasm of upper-outer quadrant of left  breast in female, estrogen receptor positive ([place])  09/29/2020 Initial Diagnosis   Malignant neoplasm of upper-outer quadrant of left breast in female, estrogen receptor positive ([place])   10/04/2020 Cancer Staging   Staging form: Breast, AJCC 8th Edition - Clinical stage from 10/04/2020: Stage IIA (cT3, cN1(f), cM0, G2, ER+, PR+, HER2-) - Signed by Chauncey Cruel, MD on 10/04/2020 Stage prefix: Initial diagnosis Method of lymph node assessment: Core biopsy Histologic grading system: 3 grade system   10/25/2020 - 11/10/2020 Chemotherapy   2 cycles of Adriamycin and Cytoxan.  Chemo discontinued because of rising LFTs.       11/25/2020 Genetic Testing   Negative genetic testing:  No pathogenic variants detected on the [employer] CancerNext-Expanded + RNAinsight panel. The report date is 11/25/2020.   The CancerNext-Expanded + RNAinsight gene panel offered by [employer] and includes sequencing and rearrangement analysis for the following 77 genes: AIP, ALK, APC, ATM, AXIN2, BAP1, BARD1, BLM, BMPR1A, BRCA1, BRCA2, BRIP1, CDC73, CDH1, CDK4, CDKN1B, CDKN2A, CHEK2, CTNNA1, DICER1, FANCC, FH, FLCN, GALNT12, KIF1B, LZTR1, MAX, MEN1, MET, MLH1, MSH2, MSH3, MSH6, MUTYH, NBN, NF1, NF2, NTHL1, PALB2, PHOX2B, PMS2, POT1, PRKAR1A, PTCH1, PTEN, RAD51C, RAD51D, RB1, RECQL, RET, SDHA, SDHAF2, SDHB, SDHC, SDHD, SMAD4, SMARCA4, SMARCB1, SMARCE1, STK11, SUFU, TMEM127, TP53, TSC1, TSC2, VHL and XRCC2 (sequencing and deletion/duplication); EGFR, EGLN1, HOXB13, KIT, MITF, PDGFRA, POLD1 and POLE (sequencing only); EPCAM and GREM1 (deletion/duplication only). RNA data is routinely analyzed for use in variant interpretation for all genes.    Anti-estrogen oral therapy   anastrozole started 12/06/2020 goserelin started 10/11/2020  zolendronate started 11/02/2018. Every 12 weeks.   01/31/2021 Surgery   Left mastectomy: Residual multifocal IDC 2 cm with intermediate grade DCIS, 15/15 lymph nodes positive, margins  negative,  lymphovascular space invasion present, ER 60%, PR 10%, HER2 negative, Ki-67 15% Right mastectomy: Fibrocystic change no residual cancer or DCIS     CHIEF COMPLIANT: Follow-up after scans   INTERVAL HISTORY: Gabriela Sullivan is a 31 y.o. with above-mentioned history of breast cancer currently on antiestrogen therapy with anastrozole. She presents to the clinic today for follow-up to . She states she did well after scans. Denies any trouble taking the Verzenio. Denies diarrhea.    ALLERGIES:  has No Known Allergies.  MEDICATIONS:  Current Outpatient Medications  Medication Sig Dispense Refill   abemaciclib (VERZENIO) 150 MG tablet Take 1 tablet (150 mg total) by mouth 2 (two) times daily. Swallow tablets whole. Do not chew, crush, or split tablets before swallowing. 56 tablet 3   anastrozole (ARIMIDEX) 1 MG tablet Take 1 tablet (1 mg total) by mouth daily. 90 tablet 3   calcium carbonate (TUMS - DOSED IN MG ELEMENTAL CALCIUM) 500 MG chewable tablet Chew 2 tablets by mouth every other day.     cholecalciferol (VITAMIN D3) 25 MCG (1000 UNIT) tablet Take 1,000 Units by mouth daily.     Goserelin Acetate (ZOLADEX [place]) Inject 1 Dose into the skin every 30 (thirty) days. 10/24/21 was her last dose     omeprazole (PRILOSEC) 20 MG capsule Take 1 capsule (20 mg total) by mouth daily. 30 capsule 0   ondansetron (ZOFRAN-ODT) 4 MG disintegrating tablet Take 1 tablet (4 mg total) by mouth every 8 (eight) hours as needed for nausea or vomiting. 8 tablet 0   potassium chloride SA (KLOR-CON M) 20 MEQ tablet Take 1 tablet (20 mEq total) by mouth 2 (two) times daily. 30 tablet 2   Semaglutide (OZEMPIC, 0.25 OR 0.5 MG/DOSE, ) Inject 0.25 mg into the skin once a week. mondays     No current facility-administered medications for this visit.    PHYSICAL EXAMINATION: ECOG PERFORMANCE STATUS: 1 - Symptomatic but completely ambulatory  Vitals:   01/15/22 0948  BP: (!) 128/90  Pulse: 88  Resp: 18  Temp: 97.8 F  (36.6 C)  SpO2: 98%   Filed Weights   01/15/22 0948  Weight: 189 lb 3.2 oz (85.8 kg)     LABORATORY DATA:  I have reviewed the data as listed    Latest Ref Rng & Units 01/10/2022    8:16 AM 10/26/2021    5:31 PM 10/23/2021    8:46 AM  CMP  Glucose 70 - 99 mg/dL 145  116  107   BUN 6 - 20 mg/dL _0 Creatinine 0.44 - 1.00 mg/dL 0.55  0.71  0.63   Sodium 135 - 145 mmol/L 140  143  141   Potassium 3.5 - 5.1 mmol/L 3.6  3.7  3.5   Chloride 98 - 111 mmol/L 107  110  110   CO2 22 - 32 mmol/L _1 Calcium 8.9 - 10.3 mg/dL 8.8  9.3  9.2   Total Protein 6.5 - 8.1 g/dL 7.2  8.6  7.2   Total Bilirubin 0.3 - 1.2 mg/dL 0.4  1.0  0.6   Alkaline Phos 38 - 126 U/L 31  35  31   AST 15 - 41 U/L 47  37  32   ALT 0 - 44 U/L 95  55  42     Lab Results  Component Value Date   WBC 3.8 (L) 01/10/2022   HGB 11.8 (L) 01/10/2022  HCT 31.8 (L) 01/10/2022   MCV 94.6 01/10/2022   PLT 258 01/10/2022   NEUTROABS 2.2 01/10/2022    ASSESSMENT & PLAN:  Malignant neoplasm of upper-outer quadrant of left breast in female, estrogen receptor positive 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% 09/25/2020: Right breast biopsy: DCIS grade 1, ER/PR positive 10/06/2020  breast MRI showed a 11 cm area of malignancy, with a second 1.9 cm mass, and skin thickening concerning for inflammatory breast carcinoma; at least 6 abnormal lymph nodes noted 10/13/2020 CT chest shows clear lungs and no obvious liver involvement but multiple lytic bone lesions in addition to the findings and MRI above 10/21/2020 spine MRI: Shows widely scattered thoracic and lumbar vertebral body metastases 10/24/2020: 2 cycles of dose dense Adriamycin and Cytoxan discontinued because of rising LFTs 12/01/2020: MRI abdomen: No evidence of metastatic disease in the liver 01/31/2021:Left mastectomy: Residual multifocal IDC 2 cm with intermediate grade  DCIS, 15/15 lymph nodes positive, margins negative, lymphovascular space invasion present, ER 60%, PR 10%, HER2 negative, Ki-67 15% Right mastectomy: Fibrocystic change no residual cancer or DCIS   Current treatment: Anastrozole with goserelin and Zometa and Verzenio   Anastrozole toxicities: Tolerating it very well Abemaciclib toxicities: Very occasional loose stools    08/14/2021: CT CAP and bone scan: Stable bone metastases 01/11/2022: CT CAP: Further decrease in the 8 mm left axillary lymph node.  Stable bone metastases 01/12/2022: Bone scan: 3 foci of uptake were identified in the pelvis.  Left posterior iliac crest uptake was not identified in the previous study.  The other 2 are stable  Radiology review: I did not consider the above findings to suggest progression of disease. Continue with the current treatment  She saw plastic surgery regarding reconstruction options.  She might consider the TRAM flap next year.  She will need to be off medications for significant length of time to enable her to get the reconstruction done.  Plan to perform additional scans in 6 months.    No orders of the defined types were placed in this encounter.  The patient has a good understanding of the overall plan. she agrees with it. she will call with any problems that may develop before the next visit here. Total time spent: 30 mins including face to face time and time spent for planning, charting and co-ordination of care   Harriette Ohara, MD 01/15/22    I Gardiner Coins am scribing for Dr. Lindi Adie  I have reviewed the above documentation for accuracy and completeness, and I agree with the above.

## 2022-01-15 NOTE — Progress Notes (Signed)
Ok to give zometa today with calcium 8.8 per Dr Lindi Adie.

## 2022-01-15 NOTE — Assessment & Plan Note (Signed)
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% 09/25/2020: Right breast biopsy: DCIS grade 1, ER/PR positive 10/06/2020 breast MRI showeda 11 cm area of malignancy, with a second 1.9 cm mass, and skin thickening concerning for inflammatory breast carcinoma; at least 6 abnormal lymph nodes noted 05/13/2022CT chestshows clear lungs and no obvious liver involvement but multiple lytic bone lesions in addition to the findings and MRI above 05/21/2022spine MRI: Shows widely scattered thoracic and lumbar vertebral body metastases 10/24/2020: 2 cycles of dose dense Adriamycin and Cytoxan discontinued because of rising LFTs 12/01/2020: MRI abdomen: No evidence of metastatic disease in the liver 01/31/2021:Left mastectomy: Residual multifocal IDC 2 cm with intermediate grade DCIS, 15/15 lymph nodes positive, margins negative, lymphovascular space invasion present, ER 60%, PR 10%, HER2 negative, Ki-67 15% Right mastectomy: Fibrocystic change no residual cancer or DCIS  Current treatment: Anastrozole with goserelin and Zometaand Verzenio  Anastrozole toxicities: Tolerating it very well Abemaciclib toxicities:Very occasional loose stools  08/14/2021: CT CAP and bone scan:Stable bone metastases 01/11/2022: CT CAP: Further decrease in the 8 mm left axillary lymph node.  Stable bone metastases 01/12/2022: Bone scan: 3 foci of uptake were identified in the pelvis.  Left posterior iliac crest uptake was not identified in the previous study.  The other 2 are stable  Radiology review: I did not consider the above findings to suggest progression of disease. Continue with the current treatment Plan to perform additional scans in 6 months.

## 2022-01-15 NOTE — Progress Notes (Signed)
Ok to give zometa w/ Calcium of 8.8 per MD   Larene Beach, PharmD

## 2022-01-23 ENCOUNTER — Telehealth: Payer: Self-pay | Admitting: Hematology and Oncology

## 2022-01-23 NOTE — Telephone Encounter (Signed)
Rescheduled and scheduled appointment per 8/15 los. Left voicemail.

## 2022-01-25 ENCOUNTER — Other Ambulatory Visit (HOSPITAL_COMMUNITY): Payer: Self-pay

## 2022-01-25 ENCOUNTER — Other Ambulatory Visit: Payer: Self-pay | Admitting: Hematology and Oncology

## 2022-01-25 MED ORDER — ABEMACICLIB 150 MG PO TABS
150.0000 mg | ORAL_TABLET | Freq: Two times a day (BID) | ORAL | 3 refills | Status: DC
  Filled 2022-01-25: qty 56, 28d supply, fill #0

## 2022-01-31 ENCOUNTER — Other Ambulatory Visit (HOSPITAL_COMMUNITY): Payer: Self-pay

## 2022-02-12 ENCOUNTER — Other Ambulatory Visit (HOSPITAL_COMMUNITY): Payer: Self-pay

## 2022-02-12 ENCOUNTER — Inpatient Hospital Stay: Payer: Medicaid Other

## 2022-02-12 ENCOUNTER — Other Ambulatory Visit: Payer: Self-pay

## 2022-02-12 ENCOUNTER — Inpatient Hospital Stay: Payer: Medicaid Other | Attending: Oncology

## 2022-02-12 ENCOUNTER — Inpatient Hospital Stay (HOSPITAL_BASED_OUTPATIENT_CLINIC_OR_DEPARTMENT_OTHER): Payer: Medicaid Other | Admitting: Hematology and Oncology

## 2022-02-12 VITALS — BP 135/72 | HR 81 | Temp 97.7°F | Resp 18 | Ht 60.0 in | Wt 188.8 lb

## 2022-02-12 DIAGNOSIS — Z170 Estrogen receptor positive status [ER+]: Secondary | ICD-10-CM

## 2022-02-12 DIAGNOSIS — C50412 Malignant neoplasm of upper-outer quadrant of left female breast: Secondary | ICD-10-CM | POA: Insufficient documentation

## 2022-02-12 DIAGNOSIS — Z5111 Encounter for antineoplastic chemotherapy: Secondary | ICD-10-CM | POA: Diagnosis present

## 2022-02-12 DIAGNOSIS — C7951 Secondary malignant neoplasm of bone: Secondary | ICD-10-CM | POA: Diagnosis not present

## 2022-02-12 DIAGNOSIS — Z95828 Presence of other vascular implants and grafts: Secondary | ICD-10-CM

## 2022-02-12 DIAGNOSIS — Z9013 Acquired absence of bilateral breasts and nipples: Secondary | ICD-10-CM | POA: Diagnosis not present

## 2022-02-12 DIAGNOSIS — Z79811 Long term (current) use of aromatase inhibitors: Secondary | ICD-10-CM | POA: Diagnosis not present

## 2022-02-12 LAB — CBC WITH DIFFERENTIAL (CANCER CENTER ONLY)
Abs Immature Granulocytes: 0.01 10*3/uL (ref 0.00–0.07)
Basophils Absolute: 0.1 10*3/uL (ref 0.0–0.1)
Basophils Relative: 2 %
Eosinophils Absolute: 0 10*3/uL (ref 0.0–0.5)
Eosinophils Relative: 1 %
HCT: 33.2 % — ABNORMAL LOW (ref 36.0–46.0)
Hemoglobin: 12.3 g/dL (ref 12.0–15.0)
Immature Granulocytes: 0 %
Lymphocytes Relative: 29 %
Lymphs Abs: 0.9 10*3/uL (ref 0.7–4.0)
MCH: 35.7 pg — ABNORMAL HIGH (ref 26.0–34.0)
MCHC: 37 g/dL — ABNORMAL HIGH (ref 30.0–36.0)
MCV: 96.2 fL (ref 80.0–100.0)
Monocytes Absolute: 0.2 10*3/uL (ref 0.1–1.0)
Monocytes Relative: 6 %
Neutro Abs: 1.9 10*3/uL (ref 1.7–7.7)
Neutrophils Relative %: 62 %
Platelet Count: 215 10*3/uL (ref 150–400)
RBC: 3.45 MIL/uL — ABNORMAL LOW (ref 3.87–5.11)
RDW: 13.1 % (ref 11.5–15.5)
WBC Count: 3 10*3/uL — ABNORMAL LOW (ref 4.0–10.5)
nRBC: 0 % (ref 0.0–0.2)

## 2022-02-12 LAB — CMP (CANCER CENTER ONLY)
ALT: 87 U/L — ABNORMAL HIGH (ref 0–44)
AST: 76 U/L — ABNORMAL HIGH (ref 15–41)
Albumin: 4.2 g/dL (ref 3.5–5.0)
Alkaline Phosphatase: 36 U/L — ABNORMAL LOW (ref 38–126)
Anion gap: 4 — ABNORMAL LOW (ref 5–15)
BUN: 14 mg/dL (ref 6–20)
CO2: 30 mmol/L (ref 22–32)
Calcium: 9.1 mg/dL (ref 8.9–10.3)
Chloride: 108 mmol/L (ref 98–111)
Creatinine: 0.56 mg/dL (ref 0.44–1.00)
GFR, Estimated: >60 mL/min (ref >60)
Glucose, Bld: 138 mg/dL — ABNORMAL HIGH (ref 70–99)
Potassium: 4.1 mmol/L (ref 3.5–5.1)
Sodium: 142 mmol/L (ref 135–145)
Total Bilirubin: 0.6 mg/dL (ref 0.3–1.2)
Total Protein: 7.1 g/dL (ref 6.5–8.1)

## 2022-02-12 MED ORDER — HEPARIN SOD (PORK) LOCK FLUSH 100 UNIT/ML IV SOLN
500.0000 [IU] | Freq: Once | INTRAVENOUS | Status: AC
  Administered 2022-02-12: 500 [IU]

## 2022-02-12 MED ORDER — SODIUM CHLORIDE 0.9% FLUSH
10.0000 mL | Freq: Once | INTRAVENOUS | Status: AC
  Administered 2022-02-12: 10 mL

## 2022-02-12 MED ORDER — ABEMACICLIB 100 MG PO TABS
100.0000 mg | ORAL_TABLET | Freq: Two times a day (BID) | ORAL | 3 refills | Status: DC
  Filled 2022-02-12: qty 60, 30d supply, fill #0
  Filled 2022-02-26: qty 56, 28d supply, fill #0
  Filled 2022-03-29: qty 56, 28d supply, fill #1
  Filled 2022-04-24: qty 56, 28d supply, fill #2
  Filled 2022-05-21: qty 56, 28d supply, fill #3

## 2022-02-12 MED ORDER — GOSERELIN ACETATE 3.6 MG ~~LOC~~ IMPL
3.6000 mg | DRUG_IMPLANT | Freq: Once | SUBCUTANEOUS | Status: AC
  Administered 2022-02-12: 3.6 mg via SUBCUTANEOUS
  Filled 2022-02-12: qty 3.6

## 2022-02-12 NOTE — Progress Notes (Signed)
Patient Care Team: [name], MD as PCP - General (Family Medicine) Rockwell Germany, RN as Oncology Nurse Navigator Mauro Kaufmann, RN as Oncology Nurse Navigator Erroll Luna, MD as Consulting Physician (General Surgery) Kyung Rudd, MD as Consulting Physician (Radiation Oncology) Nicholas Lose, MD as Consulting Physician (Hematology and Oncology)  DIAGNOSIS:  Encounter Diagnosis  Name Primary?   Malignant neoplasm of upper-outer quadrant of left breast in female, estrogen receptor positive ([place]) Yes    SUMMARY OF ONCOLOGIC HISTORY: Oncology History  Ductal carcinoma in situ (DCIS) of right breast  09/29/2020 Initial Diagnosis   Ductal carcinoma in situ (DCIS) of right breast   10/04/2020 Cancer Staging   Staging form: Breast, AJCC 8th Edition - Clinical: Stage 0 (cTis (DCIS), cN0, cM0) - Signed by Chauncey Cruel, MD on 10/04/2020   11/25/2020 Genetic Testing   Negative genetic testing:  No pathogenic variants detected on the [employer] CancerNext-Expanded + RNAinsight panel. The report date is 11/25/2020.   The CancerNext-Expanded + RNAinsight gene panel offered by [employer] and includes sequencing and rearrangement analysis for the following 77 genes: AIP, ALK, APC, ATM, AXIN2, BAP1, BARD1, BLM, BMPR1A, BRCA1, BRCA2, BRIP1, CDC73, CDH1, CDK4, CDKN1B, CDKN2A, CHEK2, CTNNA1, DICER1, FANCC, FH, FLCN, GALNT12, KIF1B, LZTR1, MAX, MEN1, MET, MLH1, MSH2, MSH3, MSH6, MUTYH, NBN, NF1, NF2, NTHL1, PALB2, PHOX2B, PMS2, POT1, PRKAR1A, PTCH1, PTEN, RAD51C, RAD51D, RB1, RECQL, RET, SDHA, SDHAF2, SDHB, SDHC, SDHD, SMAD4, SMARCA4, SMARCB1, SMARCE1, STK11, SUFU, TMEM127, TP53, TSC1, TSC2, VHL and XRCC2 (sequencing and deletion/duplication); EGFR, EGLN1, HOXB13, KIT, MITF, PDGFRA, POLD1 and POLE (sequencing only); EPCAM and GREM1 (deletion/duplication only). RNA data is routinely analyzed for use in variant interpretation for all genes.   Malignant neoplasm of upper-outer quadrant of  left breast in female, estrogen receptor positive ([place])  09/29/2020 Initial Diagnosis   Malignant neoplasm of upper-outer quadrant of left breast in female, estrogen receptor positive ([place])   10/04/2020 Cancer Staging   Staging form: Breast, AJCC 8th Edition - Clinical stage from 10/04/2020: Stage IIA (cT3, cN1(f), cM0, G2, ER+, PR+, HER2-) - Signed by Chauncey Cruel, MD on 10/04/2020 Stage prefix: Initial diagnosis Method of lymph node assessment: Core biopsy Histologic grading system: 3 grade system   10/25/2020 - 11/10/2020 Chemotherapy   2 cycles of Adriamycin and Cytoxan.  Chemo discontinued because of rising LFTs.       11/25/2020 Genetic Testing   Negative genetic testing:  No pathogenic variants detected on the [employer] CancerNext-Expanded + RNAinsight panel. The report date is 11/25/2020.   The CancerNext-Expanded + RNAinsight gene panel offered by [employer] and includes sequencing and rearrangement analysis for the following 77 genes: AIP, ALK, APC, ATM, AXIN2, BAP1, BARD1, BLM, BMPR1A, BRCA1, BRCA2, BRIP1, CDC73, CDH1, CDK4, CDKN1B, CDKN2A, CHEK2, CTNNA1, DICER1, FANCC, FH, FLCN, GALNT12, KIF1B, LZTR1, MAX, MEN1, MET, MLH1, MSH2, MSH3, MSH6, MUTYH, NBN, NF1, NF2, NTHL1, PALB2, PHOX2B, PMS2, POT1, PRKAR1A, PTCH1, PTEN, RAD51C, RAD51D, RB1, RECQL, RET, SDHA, SDHAF2, SDHB, SDHC, SDHD, SMAD4, SMARCA4, SMARCB1, SMARCE1, STK11, SUFU, TMEM127, TP53, TSC1, TSC2, VHL and XRCC2 (sequencing and deletion/duplication); EGFR, EGLN1, HOXB13, KIT, MITF, PDGFRA, POLD1 and POLE (sequencing only); EPCAM and GREM1 (deletion/duplication only). RNA data is routinely analyzed for use in variant interpretation for all genes.    Anti-estrogen oral therapy   anastrozole started 12/06/2020 goserelin started 10/11/2020  zolendronate started 11/02/2018. Every 12 weeks.   01/31/2021 Surgery   Left mastectomy: Residual multifocal IDC 2 cm with intermediate grade DCIS, 15/15 lymph nodes positive, margins  negative,  lymphovascular space invasion present, ER 60%, PR 10%, HER2 negative, Ki-67 15% Right mastectomy: Fibrocystic change no residual cancer or DCIS     CHIEF COMPLIANT: Follow-up on anastrozole and Verzenio  INTERVAL HISTORY: Gabriela Sullivan is a 31 y.o with the above mention. She presents to the clinic today for a follow-up. She denies diarrhea. She is having hot flashes but mostly at night.  She is tolerating Zoladex with Verzinio and-extremely well without any major problems or concerns.  She also tolerates bisphosphonate therapy extremely well.  ALLERGIES:  has No Known Allergies.  MEDICATIONS:  Current Outpatient Medications  Medication Sig Dispense Refill   abemaciclib (VERZENIO) 100 MG tablet Take 1 tablet (100 mg total) by mouth 2 (two) times daily. Swallow tablets whole. Do not chew, crush, or split tablets before swallowing. 60 tablet 3   anastrozole (ARIMIDEX) 1 MG tablet Take 1 tablet (1 mg total) by mouth daily. 90 tablet 3   calcium carbonate (TUMS - DOSED IN MG ELEMENTAL CALCIUM) 500 MG chewable tablet Chew 2 tablets by mouth every other day.     cholecalciferol (VITAMIN D3) 25 MCG (1000 UNIT) tablet Take 1,000 Units by mouth daily.     Goserelin Acetate (ZOLADEX [place]) Inject 1 Dose into the skin every 30 (thirty) days. 10/24/21 was her last dose     omeprazole (PRILOSEC) 20 MG capsule Take 1 capsule (20 mg total) by mouth daily. 30 capsule 0   ondansetron (ZOFRAN-ODT) 4 MG disintegrating tablet Take 1 tablet (4 mg total) by mouth every 8 (eight) hours as needed for nausea or vomiting. 8 tablet 0   potassium chloride SA (KLOR-CON M) 20 MEQ tablet Take 1 tablet (20 mEq total) by mouth 2 (two) times daily. 30 tablet 2   Semaglutide (OZEMPIC, 0.25 OR 0.5 MG/DOSE, [place]) Inject 0.25 mg into the skin once a week. mondays     No current facility-administered medications for this visit.    PHYSICAL EXAMINATION: ECOG PERFORMANCE STATUS: 1 - Symptomatic but completely ambulatory  Vitals:    02/12/22 1115  BP: 135/72  Pulse: 81  Resp: 18  Temp: 97.7 F (36.5 C)  SpO2: 99%   Filed Weights   02/12/22 1115  Weight: 188 lb 12.8 oz (85.6 kg)     LABORATORY DATA:  I have reviewed the data as listed    Latest Ref Rng & Units 02/12/2022   10:24 AM 01/10/2022    8:16 AM 10/26/2021    5:31 PM  CMP  Glucose 70 - 99 mg/dL 138  145  116   BUN 6 - 20 mg/dL _0 Creatinine 0.44 - 1.00 mg/dL 0.56  0.55  0.71   Sodium 135 - 145 mmol/L 142  140  143   Potassium 3.5 - 5.1 mmol/L 4.1  3.6  3.7   Chloride 98 - 111 mmol/L 108  107  110   CO2 22 - 32 mmol/L _1 Calcium 8.9 - 10.3 mg/dL 9.1  8.8  9.3   Total Protein 6.5 - 8.1 g/dL 7.1  7.2  8.6   Total Bilirubin 0.3 - 1.2 mg/dL 0.6  0.4  1.0   Alkaline Phos 38 - 126 U/L 36  31  35   AST 15 - 41 U/L 76  47  37   ALT 0 - 44 U/L 87  95  55     Lab Results  Component Value Date   WBC 3.0 (L)  02/12/2022   HGB 12.3 02/12/2022   HCT 33.2 (L) 02/12/2022   MCV 96.2 02/12/2022   PLT 215 02/12/2022   NEUTROABS 1.9 02/12/2022    ASSESSMENT & PLAN:  Malignant neoplasm of upper-outer quadrant of left breast in female, estrogen receptor positive [facility]) 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% 09/25/2020: Right breast biopsy: DCIS grade 1, ER/PR positive 10/06/2020  breast MRI showed a 11 cm area of malignancy, with a second 1.9 cm mass, and skin thickening concerning for inflammatory breast carcinoma; at least 6 abnormal lymph nodes noted 10/13/2020 CT chest shows clear lungs and no obvious liver involvement but multiple lytic bone lesions in addition to the findings and MRI above 10/21/2020 spine MRI: Shows widely scattered thoracic and lumbar vertebral body metastases 10/24/2020: 2 cycles of dose dense Adriamycin and Cytoxan discontinued because of rising LFTs 12/01/2020: MRI abdomen: No evidence of metastatic disease in the liver 01/31/2021:Left  mastectomy: Residual multifocal IDC 2 cm with intermediate grade DCIS, 15/15 lymph nodes positive, margins negative, lymphovascular space invasion present, ER 60%, PR 10%, HER2 negative, Ki-67 15% Right mastectomy: Fibrocystic change no residual cancer or DCIS   Current treatment: Anastrozole with goserelin and Zometa and Verzenio   Anastrozole toxicities: Tolerating it very well Abemaciclib toxicities: Very occasional loose stools    08/14/2021: CT CAP and bone scan: Stable bone metastases 01/11/2022: CT CAP: Further decrease in the 8 mm left axillary lymph node.  Stable bone metastases 01/12/2022: Bone scan: 3 foci of uptake were identified in the pelvis.  Left posterior iliac crest uptake was not identified in the previous study.  The other 2 are stable   Radiology review: I did not consider the above findings to suggest progression of disease. Continue with the current treatment   She saw plastic surgery regarding reconstruction options.  She might consider the TRAM flap next year.  She will need to be off medications for significant length of time to enable her to get the reconstruction done.   Plan to perform additional scans in 6 months.   Orders Placed This Encounter  Procedures   CT CHEST ABDOMEN PELVIS W CONTRAST    Standing Status:   Future    Standing Expiration Date:   02/13/2023    Order Specific Question:   Is patient pregnant?    Answer:   No    Order Specific Question:   Preferred imaging location?    Answer:   [facility]    Order Specific Question:   Is Oral Contrast requested for this exam?    Answer:   Yes, Per Radiology protocol   The patient has a good understanding of the overall plan. she agrees with it. she will call with any problems that may develop before the next visit here. Total time spent: 30 mins including face to face time and time spent for planning, charting and co-ordination of care   Harriette Ohara, MD 02/12/22    I Gardiner Coins  am scribing for Dr. Lindi Adie  I have reviewed the above documentation for accuracy and completeness, and I agree with the above.

## 2022-02-12 NOTE — Patient Instructions (Signed)
Goserelin Implant What is this medication? GOSERELIN (GOE se rel in) treats prostate cancer and breast cancer. It works by decreasing levels of the hormones testosterone and estrogen in the body. This prevents prostate and breast cancer cells from spreading or growing. It may also be used to treat endometriosis. This is a condition where the tissue that lines the uterus grows outside the uterus. It works by decreasing the amount of estrogen your body makes, which reduces heavy bleeding and pain. It can also be used to help thin the lining of the uterus before a surgery used to prevent or reduce heavy periods. This medicine may be used for other purposes; ask your health care provider or pharmacist if you have questions. COMMON BRAND NAME(S): Zoladex, Zoladex 32-Month What should I tell my care team before I take this medication? They need to know if you have any of these conditions: Bone problems Diabetes Heart disease History of irregular heartbeat or rhythm An unusual or allergic reaction to goserelin, other medications, foods, dyes, or preservatives Pregnant or trying to get pregnant Breastfeeding How should I use this medication? This medication is injected under the skin. It is given by your care team in a hospital or clinic setting. Talk to your care team about the use of this medication in children. Special care may be needed. Overdosage: If you think you have taken too much of this medicine contact a poison control center or emergency room at once. NOTE: This medicine is only for you. Do not share this medicine with others. What if I miss a dose? Keep appointments for follow-up doses. It is important not to miss your dose. Call your care team if you are unable to keep an appointment. What may interact with this medication? Do not take this medication with any of the following: Cisapride Dronedarone Pimozide Thioridazine This medication may also interact with the following: Other  medications that cause heart rhythm changes This list may not describe all possible interactions. Give your health care provider a list of all the medicines, herbs, non-prescription drugs, or dietary supplements you use. Also tell them if you smoke, drink alcohol, or use illegal drugs. Some items may interact with your medicine. What should I watch for while using this medication? Visit your care team for regular checks on your progress. Your symptoms may appear to get worse during the first weeks of this therapy. Tell your care team if your symptoms do not start to get better or if they get worse after this time. Using this medication for a long time may weaken your bones. If you smoke or frequently drink alcohol you may increase your risk of bone loss. A family history of osteoporosis, chronic use of medications for seizures (convulsions), or corticosteroids can also increase your risk of bone loss. The risk of bone fractures may be increased. Talk to your care team about your bone health. This medication may increase blood sugar. The risk may be higher in patients who already have diabetes. Ask your care team what you can do to lower your risk of diabetes while taking this medication. This medication should stop regular monthly menstruation in women. Tell your care team if you continue to menstruate. Talk to your care team if you wish to become pregnant or think you might be pregnant. This medication can cause serious birth defects if taken during pregnancy or for 12 weeks after stopping treatment. Talk to your care team about reliable forms of contraception. Do not breastfeed while taking this  medication. This medication may cause infertility. Talk to your care team if you are concerned about your fertility. What side effects may I notice from receiving this medication? Side effects that you should report to your care team as soon as possible: Allergic reactions--skin rash, itching, hives, swelling  of the face, lips, tongue, or throat Change in the amount of urine Heart attack--pain or tightness in the chest, shoulders, arms, or jaw, nausea, shortness of breath, cold or clammy skin, feeling faint or lightheaded Heart rhythm changes--fast or irregular heartbeat, dizziness, feeling faint or lightheaded, chest pain, trouble breathing High blood sugar (hyperglycemia)--increased thirst or amount of urine, unusual weakness or fatigue, blurry vision High calcium level--increased thirst or amount of urine, nausea, vomiting, confusion, unusual weakness or fatigue, bone pain Pain, redness, irritation, or bruising at the injection site Severe back pain, numbness or weakness of the hands, arms, legs, or feet, loss of coordination, loss of bowel or bladder control Stroke--sudden numbness or weakness of the face, arm, or leg, trouble speaking, confusion, trouble walking, loss of balance or coordination, dizziness, severe headache, change in vision Swelling and pain of the tumor site or lymph nodes Trouble passing urine Side effects that usually do not require medical attention (report to your care team if they continue or are bothersome): Change in sex drive or performance Headache Hot flashes Rapid or extreme change in emotion or mood Sweating Swelling of the ankles, hands, or feet Unusual vaginal discharge, itching, or odor This list may not describe all possible side effects. Call your doctor for medical advice about side effects. You may report side effects to FDA at 1-800-FDA-1088. Where should I keep my medication? This medication is given in a hospital or clinic. It will not be stored at home. NOTE: This sheet is a summary. It may not cover all possible information. If you have questions about this medicine, talk to your doctor, pharmacist, or health care provider.  2023 Elsevier/Gold Standard (2021-10-03 00:00:00)

## 2022-02-12 NOTE — Assessment & Plan Note (Signed)
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% 09/25/2020: Right breast biopsy: DCIS grade 1, ER/PR positive 10/06/2020 breast MRI showeda 11 cm area of malignancy, with a second 1.9 cm mass, and skin thickening concerning for inflammatory breast carcinoma; at least 6 abnormal lymph nodes noted 05/13/2022CT chestshows clear lungs and no obvious liver involvement but multiple lytic bone lesions in addition to the findings and MRI above 05/21/2022spine MRI: Shows widely scattered thoracic and lumbar vertebral body metastases 10/24/2020: 2 cycles of dose dense Adriamycin and Cytoxan discontinued because of rising LFTs 12/01/2020: MRI abdomen: No evidence of metastatic disease in the liver 01/31/2021:Left mastectomy: Residual multifocal IDC 2 cm with intermediate grade DCIS, 15/15 lymph nodes positive, margins negative, lymphovascular space invasion present, ER 60%, PR 10%, HER2 negative, Ki-67 15% Right mastectomy: Fibrocystic change no residual cancer or DCIS  Current treatment: Anastrozole with goserelin and Zometaand Verzenio  Anastrozole toxicities: Tolerating it very well Abemaciclib toxicities:Very occasional loose stools  08/14/2021: CT CAP and bone scan:Stable bone metastases 01/11/2022: CT CAP: Further decrease in the 8 mm left axillary lymph node.  Stable bone metastases 01/12/2022: Bone scan: 3 foci of uptake were identified in the pelvis.  Left posterior iliac crest uptake was not identified in the previous study.  The other 2 are stable  Radiology review: I did not consider the above findings to suggest progression of disease. Continue with the current treatment  She saw plastic surgery regarding reconstruction options.  She might consider the TRAM flap next year.  She will need to be off medications for significant length of time to enable her to get the reconstruction done.  Plan to perform additional  scans in 6 months.

## 2022-02-13 ENCOUNTER — Telehealth: Payer: Self-pay | Admitting: Hematology and Oncology

## 2022-02-13 NOTE — Telephone Encounter (Signed)
Scheduled appointment per 9/12 los. Patient is aware.

## 2022-02-26 ENCOUNTER — Other Ambulatory Visit (HOSPITAL_COMMUNITY): Payer: Self-pay

## 2022-02-26 ENCOUNTER — Ambulatory Visit (INDEPENDENT_AMBULATORY_CARE_PROVIDER_SITE_OTHER): Payer: Medicaid Other | Admitting: Gastroenterology

## 2022-02-26 ENCOUNTER — Encounter: Payer: Self-pay | Admitting: Gastroenterology

## 2022-02-26 VITALS — BP 134/86 | HR 88 | Ht 60.5 in | Wt 192.2 lb

## 2022-02-26 DIAGNOSIS — C50919 Malignant neoplasm of unspecified site of unspecified female breast: Secondary | ICD-10-CM | POA: Diagnosis not present

## 2022-02-26 DIAGNOSIS — K7581 Nonalcoholic steatohepatitis (NASH): Secondary | ICD-10-CM

## 2022-02-26 DIAGNOSIS — R748 Abnormal levels of other serum enzymes: Secondary | ICD-10-CM | POA: Diagnosis not present

## 2022-02-26 NOTE — Progress Notes (Signed)
HPI :  31 year old female here for follow-up visit to discuss her history of MASH with elevated liver enzymes. Recall she has a history of obesity, PCOS, DM II diagnosed last breast cancer stage IV initially diagnosed 09/2020 s/p bilateral mastectomy.  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   Recall she was initially in October 2022, referred by Dr. Jana Hakim for elevated liver function testing. She had elevated liver enzymes with transaminitis in the low 100s back in 2016, had a negative serologic work-up by hepatology at that time and was thought to have NASH with grade 2 fibrosis based on liver biopsy.  In May 2022 she was diagnosed with breast cancer, her baseline LFTs were also in the low 100s at that time.  She underwent bilateral mastectomy and received chemo (Cyclophosphamide and Doxorubicin) infusions 5/25 and 11/10/2020 then discontinued due to rising LFTs -her ALT peaked on July 28 with a value of 515 and AST of 251.  Once these were withdrawn her AST went to 60 and ALT to 120, and have since have maintained at prior elevated levels. She was started on Venlafaxine XR 4-5 months ago. Started on Metformin 12/29/2020. No alcohol or drug use.    She subsequently was on a regimen of anastrozole, goserelin, Zometa.  She has since been transitioned to [facility] last December. She underwent a liver MRI on 11/30/2020 which did not show any evidence of metastatic disease to the liver or intra/extrahepatic ductal dilatation but showed evidence of metastatic disease to the spine.   Generally she has been feeling pretty well since have last seen her.  She had been working on weight loss over time.  She has been on Ozempic for a few months now.  Initially states states she has lost about 15 to 20 pounds, then went on vacation and unfortunately regained a lot of that weight.  She continues to work on weight loss.  She does try to drink coffee as much as she  can although she does not like it.  She does not drink any alcohol.  She does not use any over-the-counter supplements.  Earlier this year her liver enzymes were fairly normal.  In May she had a mild elevation of her ALT to 50s, this increased to 95 in August and then down to 71 recently.  She reports in discussion with Dr. Franklyn Lor her oncologist, that this could be due to her Verzenio and the dose was reduced.  She is having follow-up LFTs with her oncologist.  She has had a few staging CTs for her malignancy since have seen her, the last was done in August 11.  Her liver appeared normal on that exam with a normal spleen size.  She has had a prior serologic work-up that was negative for other causes of chronic liver disease and was immune to hepatitis a and B.    Liver, needle/core biopsy 08/03/2014: - BENIGN LIVER WITH FEATURES OF STEATOHEPATITIS, SEE COMMENT. - TRICHROME STAIN DEMONSTRATES PORTAL AND SINUSOIDAL FIBROSIS. - IRON STAIN NEGATIVE FOR HEMOSIDERIN DEPOSITION.    CT C/A/P 01/11/22: IMPRESSION: Further decrease in size of 8 mm left axillary lymph node.   No new or progressive metastatic disease identified.   Stable mixed lytic and sclerotic bone metastases in the thoracolumbar spine and pelvis.  Hepatobiliary: No masses identified. Gallbladder is unremarkable. No evidence of biliary ductal dilatation.     Past Medical History:  Diagnosis Date   Asthma  remote   Breast cancer ([place])    Elevated liver enzymes    Family history of breast cancer    Family history of ovarian cancer    Family history of prostate cancer    Family history of stomach cancer    Hepatic steatosis    Hyperglycemia    A1c 7.6% 12/19/20   Migraines    PCOS (polycystic ovarian syndrome)    Pre-diabetes      Past Surgical History:  Procedure Laterality Date   MASTECTOMY W/ SENTINEL NODE BIOPSY Right 01/31/2021   Procedure: RIGHT SIMPLE MASTECTOMY;  Surgeon: Erroll Luna, MD;  Location: [place];  Service: General;  Laterality: Right;   MODIFIED MASTECTOMY Left 01/31/2021   Procedure: LEFT MODIFIED RADICAL MASTECTOMY;  Surgeon: Erroll Luna, MD;  Location: [place];  Service: General;  Laterality: Left;   NO PAST SURGERIES     PORTACATH PLACEMENT Right 10/18/2020   Procedure: INSERTION PORT-A-CATH;  Surgeon: Erroll Luna, MD;  Location: [place];  Service: General;  Laterality: Right;   Family History  Problem Relation Age of Onset   Ovarian cancer Maternal Grandmother        unknown age of diagnosis   Prostate cancer Maternal Grandfather        metastatic   Breast cancer Cousin        unknown age of diagnosis, maternal first cousin   Stomach cancer Cousin        dx 20s/30s, maternal first cousin   Social History   Tobacco Use   Smoking status: Never   Smokeless tobacco: Never  Vaping Use   Vaping Use: Never used  Substance Use Topics   Alcohol use: Never   Drug use: Never   Current Outpatient Medications  Medication Sig Dispense Refill   abemaciclib (VERZENIO) 100 MG tablet Take 1 tablet (100 mg total) by mouth 2 (two) times daily. Swallow tablets whole. Do not chew, crush, or split tablets before swallowing. 60 tablet 3   anastrozole (ARIMIDEX) 1 MG tablet Take 1 tablet (1 mg total) by mouth daily. 90 tablet 3   calcium carbonate (TUMS - DOSED IN MG ELEMENTAL CALCIUM) 500 MG chewable tablet Chew 2 tablets by mouth every other day.     cholecalciferol (VITAMIN D3) 25 MCG (1000 UNIT) tablet Take 1,000 Units by mouth daily.     Goserelin Acetate (ZOLADEX [place]) Inject 1 Dose into the skin every 30 (thirty) days. 10/24/21 was her last dose     omeprazole (PRILOSEC) 20 MG capsule Take 1 capsule (20 mg total) by mouth daily. 30 capsule 0   ondansetron (ZOFRAN-ODT) 4 MG disintegrating tablet Take 1 tablet (4 mg total) by mouth every 8 (eight) hours as needed for nausea or vomiting. 8 tablet 0   potassium chloride SA (KLOR-CON M) 20 MEQ tablet Take 1 tablet  (20 mEq total) by mouth 2 (two) times daily. 30 tablet 2   Semaglutide (OZEMPIC, 0.25 OR 0.5 MG/DOSE, [place]) Inject 0.25 mg into the skin once a week. mondays     No current facility-administered medications for this visit.   No Known Allergies   Review of Systems: All systems reviewed and negative except where noted in HPI.   Lab Results  Component Value Date   ALT 87 (H) 02/12/2022   AST 76 (H) 02/12/2022   ALKPHOS 36 (L) 02/12/2022   BILITOT 0.6 02/12/2022     Physical Exam: BP 134/86 (BP Location: Right Arm, Patient Position: Sitting, Cuff Size: Normal)  Pulse 88   Ht 5' 0.5" (1.537 m) Comment: height measured without shoes  Wt 192 lb 4 oz (87.2 kg)   BMI 36.93 kg/m  Constitutional: Pleasant,well-developed, female in no acute distress. Neurological: Alert and oriented to person place and time. Psychiatric: Normal mood and affect. Behavior is normal.   ASSESSMENT: 31 y.o. female here for assessment of the following  1. NASH (nonalcoholic steatohepatitis)   2. Elevated liver enzymes   3. Malignant neoplasm of female breast, unspecified estrogen receptor status, unspecified laterality, unspecified site of breast ([place])    Biopsy-proven MASH years ago, she has tried to work on weight loss over time.  Has been on Ozempic and had good weight loss initially although regained weight when she went on vacation in recent months.  She continues to work on weight loss.  I think Ozempic is a good regimen for her if this can help her with weight loss.  We discussed risks of continued inflammation and worsening fibrosis, with risks of cirrhosis.  Her imaging does not support any evidence of cirrhosis at this point time.  It is quite possible that her recent increase in transaminitis is due to the [facility] and she has had a dose adjustment per oncology.  Hopefully that will reduce her transaminitis and she will have her follow-up labs with them so they can titrate the dose.  We discussed  options moving forward for monitoring this.  While she does not have cirrhosis, she does have a history of fibrosis, I offered her an ultrasound with elastography to see what her score is and can use this over time to compare values, trying to avoid liver biopsy if possible.  Hopefully with time and dose reduction of Verzenio that her enzymes improve.  Otherwise continue liberal coffee intake which can help reduce risk for fibrotic changes and cirrhosis moving forward.  She will follow-up with her physician who is managing the Ozempic, I will see her every 6 to 12 months and relay results of the elastography when that is completed.  She agrees, all questions answered.  PLAN: - schedule RUQ [place] with elastography  - trend LFTs per oncology following dose reduction of Verzenio  - encouraged routine coffee intake - work on weight loss - on Ozempic - minimize EtOH use - f/u 6-12 months  Jolly Mango, MD [facility]

## 2022-02-26 NOTE — Patient Instructions (Addendum)
_______________________________________________________  If you are age 32 or older, your body mass index should be between 23-30. Your Body mass index is 36.93 kg/m. If this is out of the aforementioned range listed, please consider follow up with your Primary Care Provider.  If you are age 21 or younger, your body mass index should be between 19-25. Your Body mass index is 36.93 kg/m. If this is out of the aformentioned range listed, please consider follow up with your Primary Care Provider.   ________________________________________________________  The [facility] providers would like to encourage you to use [facility] to communicate with providers for non-urgent requests or questions.  Due to long hold times on the telephone, sending your provider a message by [facility] may be a faster and more efficient way to get a response.  Please allow 48 business hours for a response.  Please remember that this is for non-urgent requests.  _______________________________________________________  Gabriela Sullivan have been scheduled for an abdominal ultrasound of your liver with Elastography at [facility] (1st floor of hospital) on Thursday, 9-28 at 8:30 am. Please arrive 15 minutes prior to your appointment for registration. Make certain not to have anything to eat or drink 6 hours prior to your appointment. Should you need to reschedule your appointment, please contact radiology at [phone]. This test typically takes about 30 minutes to perform.  Continue Ozempic.  We recommend you drink coffee.  Please follow up in 6 to 12 months, June 2024. We will remind you when it is time to schedule an appointment.   Thank you for entrusting me with your care and for choosing [facility], Dr. Kingstree Cellar

## 2022-02-28 ENCOUNTER — Ambulatory Visit (HOSPITAL_COMMUNITY)
Admission: RE | Admit: 2022-02-28 | Discharge: 2022-02-28 | Disposition: A | Discharge status: Home / Self Care | Payer: Medicaid Other | Source: Ambulatory Visit | Attending: Gastroenterology | Admitting: Gastroenterology

## 2022-02-28 DIAGNOSIS — R748 Abnormal levels of other serum enzymes: Secondary | ICD-10-CM | POA: Insufficient documentation

## 2022-02-28 DIAGNOSIS — K7581 Nonalcoholic steatohepatitis (NASH): Secondary | ICD-10-CM | POA: Insufficient documentation

## 2022-02-28 DIAGNOSIS — C50919 Malignant neoplasm of unspecified site of unspecified female breast: Secondary | ICD-10-CM | POA: Insufficient documentation

## 2022-03-05 ENCOUNTER — Other Ambulatory Visit (HOSPITAL_COMMUNITY): Payer: Self-pay

## 2022-03-12 ENCOUNTER — Inpatient Hospital Stay: Payer: Medicaid Other | Attending: Oncology

## 2022-03-12 VITALS — BP 126/91 | HR 92 | Temp 99.0°F | Resp 18

## 2022-03-12 DIAGNOSIS — C50412 Malignant neoplasm of upper-outer quadrant of left female breast: Secondary | ICD-10-CM | POA: Insufficient documentation

## 2022-03-12 DIAGNOSIS — Z170 Estrogen receptor positive status [ER+]: Secondary | ICD-10-CM | POA: Diagnosis not present

## 2022-03-12 DIAGNOSIS — Z5111 Encounter for antineoplastic chemotherapy: Secondary | ICD-10-CM | POA: Diagnosis not present

## 2022-03-12 DIAGNOSIS — Z95828 Presence of other vascular implants and grafts: Secondary | ICD-10-CM

## 2022-03-12 DIAGNOSIS — C7951 Secondary malignant neoplasm of bone: Secondary | ICD-10-CM | POA: Insufficient documentation

## 2022-03-12 LAB — CBC WITH DIFFERENTIAL (CANCER CENTER ONLY)
Abs Immature Granulocytes: 0.02 10*3/uL (ref 0.00–0.07)
Basophils Absolute: 0.1 10*3/uL (ref 0.0–0.1)
Basophils Relative: 2 %
Eosinophils Absolute: 0.1 10*3/uL (ref 0.0–0.5)
Eosinophils Relative: 2 %
HCT: 34.9 % — ABNORMAL LOW (ref 36.0–46.0)
Hemoglobin: 13 g/dL (ref 12.0–15.0)
Immature Granulocytes: 1 %
Lymphocytes Relative: 27 %
Lymphs Abs: 0.9 10*3/uL (ref 0.7–4.0)
MCH: 34.7 pg — ABNORMAL HIGH (ref 26.0–34.0)
MCHC: 37.2 g/dL — ABNORMAL HIGH (ref 30.0–36.0)
MCV: 93.1 fL (ref 80.0–100.0)
Monocytes Absolute: 0.3 10*3/uL (ref 0.1–1.0)
Monocytes Relative: 8 %
Neutro Abs: 2.1 10*3/uL (ref 1.7–7.7)
Neutrophils Relative %: 60 %
Platelet Count: 270 10*3/uL (ref 150–400)
RBC: 3.75 MIL/uL — ABNORMAL LOW (ref 3.87–5.11)
RDW: 12.7 % (ref 11.5–15.5)
WBC Count: 3.4 10*3/uL — ABNORMAL LOW (ref 4.0–10.5)
nRBC: 0 % (ref 0.0–0.2)

## 2022-03-12 LAB — CMP (CANCER CENTER ONLY)
ALT: 144 U/L — ABNORMAL HIGH (ref 0–44)
AST: 82 U/L — ABNORMAL HIGH (ref 15–41)
Albumin: 4.2 g/dL (ref 3.5–5.0)
Alkaline Phosphatase: 40 U/L (ref 38–126)
Anion gap: 3 — ABNORMAL LOW (ref 5–15)
BUN: 14 mg/dL (ref 6–20)
CO2: 27 mmol/L (ref 22–32)
Calcium: 8.8 mg/dL — ABNORMAL LOW (ref 8.9–10.3)
Chloride: 107 mmol/L (ref 98–111)
Creatinine: 0.52 mg/dL (ref 0.44–1.00)
GFR, Estimated: >60 mL/min (ref >60)
Glucose, Bld: 182 mg/dL — ABNORMAL HIGH (ref 70–99)
Potassium: 3.8 mmol/L (ref 3.5–5.1)
Sodium: 137 mmol/L (ref 135–145)
Total Bilirubin: 0.6 mg/dL (ref 0.3–1.2)
Total Protein: 7.4 g/dL (ref 6.5–8.1)

## 2022-03-12 MED ORDER — GOSERELIN ACETATE 3.6 MG ~~LOC~~ IMPL
3.6000 mg | DRUG_IMPLANT | Freq: Once | SUBCUTANEOUS | Status: AC
  Administered 2022-03-12: 3.6 mg via SUBCUTANEOUS
  Filled 2022-03-12: qty 3.6

## 2022-03-12 MED ORDER — HEPARIN SOD (PORK) LOCK FLUSH 100 UNIT/ML IV SOLN
500.0000 [IU] | Freq: Once | INTRAVENOUS | Status: AC
  Administered 2022-03-12: 500 [IU]

## 2022-03-12 MED ORDER — SODIUM CHLORIDE 0.9% FLUSH
10.0000 mL | Freq: Once | INTRAVENOUS | Status: AC
  Administered 2022-03-12: 10 mL

## 2022-03-25 ENCOUNTER — Ambulatory Visit: Payer: Medicaid Other | Attending: Surgery

## 2022-03-25 VITALS — Wt 190.1 lb

## 2022-03-25 DIAGNOSIS — Z483 Aftercare following surgery for neoplasm: Secondary | ICD-10-CM | POA: Insufficient documentation

## 2022-03-25 NOTE — Therapy (Signed)
OUTPATIENT PHYSICAL THERAPY SOZO SCREENING NOTE   Patient Name: Gabriela Sullivan MRN: [id number] DOB:12-07-1990, 31 y.o., female Today's Date: 03/25/2022  PCP: [name], MD REFERRING PROVIDER: Erroll Luna, MD   PT End of Session - 03/25/22 [phone]     Visit Number 8   # unchanged due to screen only   PT Start Time 0957    PT Stop Time 1002    PT Time Calculation (min) 5 min    Activity Tolerance Patient tolerated treatment well    Behavior During Therapy [facility] for tasks assessed/performed             Past Medical History:  Diagnosis Date   Asthma    remote   Breast cancer ([place])    Elevated liver enzymes    Family history of breast cancer    Family history of ovarian cancer    Family history of prostate cancer    Family history of stomach cancer    Hepatic steatosis    Hyperglycemia    A1c 7.6% 12/19/20   Migraines    PCOS (polycystic ovarian syndrome)    Pre-diabetes    Past Surgical History:  Procedure Laterality Date   MASTECTOMY W/ SENTINEL NODE BIOPSY Right 01/31/2021   Procedure: RIGHT SIMPLE MASTECTOMY;  Surgeon: Erroll Luna, MD;  Location: [place];  Service: General;  Laterality: Right;   MODIFIED MASTECTOMY Left 01/31/2021   Procedure: LEFT MODIFIED RADICAL MASTECTOMY;  Surgeon: Erroll Luna, MD;  Location: [place];  Service: General;  Laterality: Left;   NO PAST SURGERIES     PORTACATH PLACEMENT Right 10/18/2020   Procedure: INSERTION PORT-A-CATH;  Surgeon: Erroll Luna, MD;  Location: [place];  Service: General;  Laterality: Right;   Patient Active Problem List   Diagnosis Date Noted   Left breast cancer with T3 tumor, >5 cm in greatest dimension ([place]) 01/31/2021   Bone metastases 12/28/2020   Port-A-Cath in place 12/06/2020   Genetic testing 11/25/2020   Goals of care, counseling/discussion 10/25/2020   Lytic bone lesions on xray 10/16/2020   Family history of ovarian cancer    Family history of prostate cancer    Family  history of breast cancer    Family history of stomach cancer    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   Malignant neoplasm of upper-outer quadrant of left breast in female, estrogen receptor positive ([place]) 09/29/2020   Elevated LFTs     REFERRING DIAG: left breast cancer at risk for lymphedema  THERAPY DIAG: Aftercare following surgery for neoplasm  PERTINENT HISTORY: Patient was diagnosed on 09/26/2020 with 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 It is ER/PR positive and HER2 negative with a Ki67 of 15%. She underwent neoadjuvant chemotherapy beginning 10/24/2020 for 2 rounds which was stopped due to elevated LFTs. She had a bilateral mastectomy with left axillary node dissection on 01/31/2021 with 15/15 nodes positive. She started Anastrozole on 12/06/2020. 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   PRECAUTIONS: left UE Lymphedema risk, None  SUBJECTIVE: Pt returns for her 3 month L-Dex screen.   PAIN:  Are you having pain? No  SOZO SCREENING: Patient was assessed today using the SOZO machine to determine the lymphedema index score. This was compared to her baseline score. It was determined that she is within the recommended range when compared to her baseline and no further action is needed at this time. She  will continue SOZO screenings. These are done every 3 months for 2 years post operatively followed by every 6 months for 2 years, and then annually.  Plan: Cont every 3 month L-Dex screens for up to 2 years from her Lt ALND (~02/01/2023)   L-DEX FLOWSHEETS - 03/25/22 1000       L-DEX LYMPHEDEMA SCREENING   Measurement Type Unilateral    L-DEX MEASUREMENT EXTREMITY Upper Extremity    POSITION  Standing    DOMINANT SIDE Right    At Risk Side Left    BASELINE SCORE (UNILATERAL) 2.9    L-DEX SCORE (UNILATERAL) 1.8    VALUE CHANGE (UNILAT) -1.1              Otelia Limes, PTA 03/25/2022, 10:03 AM

## 2022-03-29 ENCOUNTER — Other Ambulatory Visit (HOSPITAL_COMMUNITY): Payer: Self-pay

## 2022-04-02 ENCOUNTER — Other Ambulatory Visit (HOSPITAL_COMMUNITY): Payer: Self-pay

## 2022-04-02 NOTE — Progress Notes (Signed)
Patient Care Team: [name], MD as PCP - General (Family Medicine) Rockwell Germany, RN as Oncology Nurse Navigator Mauro Kaufmann, RN as Oncology Nurse Navigator Erroll Luna, MD as Consulting Physician (General Surgery) Kyung Rudd, MD as Consulting Physician (Radiation Oncology) Nicholas Lose, MD as Consulting Physician (Hematology and Oncology)  DIAGNOSIS:  Encounter Diagnosis  Name Primary?   Malignant neoplasm of upper-outer quadrant of left breast in female, estrogen receptor positive ([place]) Yes    SUMMARY OF ONCOLOGIC HISTORY: Oncology History  Ductal carcinoma in situ (DCIS) of right breast  09/29/2020 Initial Diagnosis   Ductal carcinoma in situ (DCIS) of right breast   10/04/2020 Cancer Staging   Staging form: Breast, AJCC 8th Edition - Clinical: Stage 0 (cTis (DCIS), cN0, cM0) - Signed by Chauncey Cruel, MD on 10/04/2020   11/25/2020 Genetic Testing   Negative genetic testing:  No pathogenic variants detected on the [employer] CancerNext-Expanded + RNAinsight panel. The report date is 11/25/2020.   The CancerNext-Expanded + RNAinsight gene panel offered by [employer] and includes sequencing and rearrangement analysis for the following 77 genes: AIP, ALK, APC, ATM, AXIN2, BAP1, BARD1, BLM, BMPR1A, BRCA1, BRCA2, BRIP1, CDC73, CDH1, CDK4, CDKN1B, CDKN2A, CHEK2, CTNNA1, DICER1, FANCC, FH, FLCN, GALNT12, KIF1B, LZTR1, MAX, MEN1, MET, MLH1, MSH2, MSH3, MSH6, MUTYH, NBN, NF1, NF2, NTHL1, PALB2, PHOX2B, PMS2, POT1, PRKAR1A, PTCH1, PTEN, RAD51C, RAD51D, RB1, RECQL, RET, SDHA, SDHAF2, SDHB, SDHC, SDHD, SMAD4, SMARCA4, SMARCB1, SMARCE1, STK11, SUFU, TMEM127, TP53, TSC1, TSC2, VHL and XRCC2 (sequencing and deletion/duplication); EGFR, EGLN1, HOXB13, KIT, MITF, PDGFRA, POLD1 and POLE (sequencing only); EPCAM and GREM1 (deletion/duplication only). RNA data is routinely analyzed for use in variant interpretation for all genes.   Malignant neoplasm of upper-outer quadrant of  left breast in female, estrogen receptor positive ([place])  09/29/2020 Initial Diagnosis   Malignant neoplasm of upper-outer quadrant of left breast in female, estrogen receptor positive ([place])   10/04/2020 Cancer Staging   Staging form: Breast, AJCC 8th Edition - Clinical stage from 10/04/2020: Stage IIA (cT3, cN1(f), cM0, G2, ER+, PR+, HER2-) - Signed by Chauncey Cruel, MD on 10/04/2020 Stage prefix: Initial diagnosis Method of lymph node assessment: Core biopsy Histologic grading system: 3 grade system   10/25/2020 - 11/10/2020 Chemotherapy   2 cycles of Adriamycin and Cytoxan.  Chemo discontinued because of rising LFTs.       11/25/2020 Genetic Testing   Negative genetic testing:  No pathogenic variants detected on the [employer] CancerNext-Expanded + RNAinsight panel. The report date is 11/25/2020.   The CancerNext-Expanded + RNAinsight gene panel offered by [employer] and includes sequencing and rearrangement analysis for the following 77 genes: AIP, ALK, APC, ATM, AXIN2, BAP1, BARD1, BLM, BMPR1A, BRCA1, BRCA2, BRIP1, CDC73, CDH1, CDK4, CDKN1B, CDKN2A, CHEK2, CTNNA1, DICER1, FANCC, FH, FLCN, GALNT12, KIF1B, LZTR1, MAX, MEN1, MET, MLH1, MSH2, MSH3, MSH6, MUTYH, NBN, NF1, NF2, NTHL1, PALB2, PHOX2B, PMS2, POT1, PRKAR1A, PTCH1, PTEN, RAD51C, RAD51D, RB1, RECQL, RET, SDHA, SDHAF2, SDHB, SDHC, SDHD, SMAD4, SMARCA4, SMARCB1, SMARCE1, STK11, SUFU, TMEM127, TP53, TSC1, TSC2, VHL and XRCC2 (sequencing and deletion/duplication); EGFR, EGLN1, HOXB13, KIT, MITF, PDGFRA, POLD1 and POLE (sequencing only); EPCAM and GREM1 (deletion/duplication only). RNA data is routinely analyzed for use in variant interpretation for all genes.    Anti-estrogen oral therapy   anastrozole started 12/06/2020 goserelin started 10/11/2020  Verzinio started Aug 2022 zolendronate started 11/02/2018. Every 12 weeks.   01/31/2021 Surgery   Left mastectomy: Residual multifocal IDC 2 cm with intermediate grade DCIS, 15/15  lymph nodes  positive, margins negative, lymphovascular space invasion present, ER 60%, PR 10%, HER2 negative, Ki-67 15% Right mastectomy: Fibrocystic change no residual cancer or DCIS     CHIEF COMPLIANT: Follow-up DCIS of the right breast on anastrozole and Verzenio    INTERVAL HISTORY: Gabriela Sullivan is a 31 y.o with the above mentioned DCIS right breast cancer currently on anastrozole therapy. She presents to the clinic today for a follow-up.  She reports no problems tolerating has any adverse effects to Verzinio or anastrozole or Zoladex injections.  Today she is also due for Zometa infusion. She has lost 20 to 30 pounds on Ozempic but she has not been losing any further.  She is going to discuss this further with her weight loss provider.  ALLERGIES:  has No Known Allergies.  MEDICATIONS:  Current Outpatient Medications  Medication Sig Dispense Refill   abemaciclib (VERZENIO) 100 MG tablet Take 1 tablet (100 mg total) by mouth 2 (two) times daily. Swallow tablets whole. Do not chew, crush, or split tablets before swallowing. 56 tablet 3   anastrozole (ARIMIDEX) 1 MG tablet Take 1 tablet (1 mg total) by mouth daily. 90 tablet 3   calcium carbonate (TUMS - DOSED IN MG ELEMENTAL CALCIUM) 500 MG chewable tablet Chew 2 tablets by mouth every other day.     cholecalciferol (VITAMIN D3) 25 MCG (1000 UNIT) tablet Take 1,000 Units by mouth daily.     Goserelin Acetate (ZOLADEX [place]) Inject 1 Dose into the skin every 30 (thirty) days. 10/24/21 was her last dose     omeprazole (PRILOSEC) 20 MG capsule Take 1 capsule (20 mg total) by mouth daily. 30 capsule 0   ondansetron (ZOFRAN-ODT) 4 MG disintegrating tablet Take 1 tablet (4 mg total) by mouth every 8 (eight) hours as needed for nausea or vomiting. 8 tablet 0   potassium chloride SA (KLOR-CON M) 20 MEQ tablet Take 1 tablet (20 mEq total) by mouth 2 (two) times daily. 30 tablet 2   Semaglutide (OZEMPIC, 0.25 OR 0.5 MG/DOSE, Flowella) Inject 0.25 mg into the skin once a  week. mondays     No current facility-administered medications for this visit.    PHYSICAL EXAMINATION: ECOG PERFORMANCE STATUS: 1 - Symptomatic but completely ambulatory  Vitals:   04/09/22 1321  BP: (!) 145/83  Pulse: 74  Resp: 18  Temp: (!) 97.3 F (36.3 C)  SpO2: 98%   Filed Weights   04/09/22 1321  Weight: 194 lb 4.8 oz (88.1 kg)      LABORATORY DATA:  I have reviewed the data as listed    Latest Ref Rng & Units 03/12/2022   11:13 AM 02/12/2022   10:24 AM 01/10/2022    8:16 AM  CMP  Glucose 70 - 99 mg/dL 182  138  145   BUN 6 - 20 mg/dL _0 Creatinine 0.44 - 1.00 mg/dL 0.52  0.56  0.55   Sodium 135 - 145 mmol/L 137  142  140   Potassium 3.5 - 5.1 mmol/L 3.8  4.1  3.6   Chloride 98 - 111 mmol/L 107  108  107   CO2 22 - 32 mmol/L _1 Calcium 8.9 - 10.3 mg/dL 8.8  9.1  8.8   Total Protein 6.5 - 8.1 g/dL 7.4  7.1  7.2   Total Bilirubin 0.3 - 1.2 mg/dL 0.6  0.6  0.4   Alkaline Phos 38 - 126 U/L 40  36  31   AST 15 - 41 U/L 82  76  47   ALT 0 - 44 U/L 144  87  95     Lab Results  Component Value Date   WBC 3.8 (L) 04/09/2022   HGB 12.5 04/09/2022   HCT 34.0 (L) 04/09/2022   MCV 92.9 04/09/2022   PLT 213 04/09/2022   NEUTROABS 2.2 04/09/2022    ASSESSMENT & PLAN:  Malignant neoplasm of upper-outer quadrant of left breast in female, estrogen receptor positive [facility]) 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% 09/25/2020: Right breast biopsy: DCIS grade 1, ER/PR positive 10/06/2020  breast MRI showed a 11 cm area of malignancy, with a second 1.9 cm mass, and skin thickening concerning for inflammatory breast carcinoma; at least 6 abnormal lymph nodes noted 10/13/2020 CT chest shows clear lungs and no obvious liver involvement but multiple lytic bone lesions in addition to the findings and MRI above 10/21/2020 spine MRI: Shows widely scattered thoracic and lumbar vertebral  body metastases 10/24/2020: 2 cycles of dose dense Adriamycin and Cytoxan discontinued because of rising LFTs 12/01/2020: MRI abdomen: No evidence of metastatic disease in the liver 01/31/2021:Left mastectomy: Residual multifocal IDC 2 cm with intermediate grade DCIS, 15/15 lymph nodes positive, margins negative, lymphovascular space invasion present, ER 60%, PR 10%, HER2 negative, Ki-67 15% Right mastectomy: Fibrocystic change no residual cancer or DCIS   Current treatment: Anastrozole with goserelin and Zometa and Verzenio   Anastrozole toxicities: Tolerating it very well      08/14/2021: CT CAP and bone scan: Stable bone metastases 01/11/2022: CT CAP: Further decrease in the 8 mm left axillary lymph node.  Stable bone metastases 01/12/2022: Bone scan: 3 foci of uptake were identified in the pelvis.  Left posterior iliac crest uptake was not identified in the previous study.  The other 2 are stable Next CT scan will be done in February.   Radiology review: I did not consider the above findings to suggest progression of disease. Continue with the current treatment   Plan to perform additional scans in February 2024.    No orders of the defined types were placed in this encounter.  The patient has a good understanding of the overall plan. she agrees with it. she will call with any problems that may develop before the next visit here. Total time spent: 30 mins including face to face time and time spent for planning, charting and co-ordination of care   Harriette Ohara, MD 04/09/22    I Gardiner Coins am scribing for Dr. Lindi Adie  I have reviewed the above documentation for accuracy and completeness, and I agree with the above.

## 2022-04-09 ENCOUNTER — Inpatient Hospital Stay: Payer: Medicaid Other | Attending: Oncology

## 2022-04-09 ENCOUNTER — Other Ambulatory Visit (HOSPITAL_COMMUNITY): Payer: Self-pay

## 2022-04-09 ENCOUNTER — Inpatient Hospital Stay: Payer: Medicaid Other

## 2022-04-09 ENCOUNTER — Inpatient Hospital Stay (HOSPITAL_BASED_OUTPATIENT_CLINIC_OR_DEPARTMENT_OTHER): Payer: Medicaid Other | Admitting: Hematology and Oncology

## 2022-04-09 VITALS — BP 128/62 | HR 78 | Temp 97.7°F | Resp 18

## 2022-04-09 VITALS — BP 145/83 | HR 74 | Temp 97.3°F | Resp 18 | Ht 60.5 in | Wt 194.3 lb

## 2022-04-09 DIAGNOSIS — Z95828 Presence of other vascular implants and grafts: Secondary | ICD-10-CM

## 2022-04-09 DIAGNOSIS — Z170 Estrogen receptor positive status [ER+]: Secondary | ICD-10-CM | POA: Diagnosis not present

## 2022-04-09 DIAGNOSIS — C50412 Malignant neoplasm of upper-outer quadrant of left female breast: Secondary | ICD-10-CM | POA: Insufficient documentation

## 2022-04-09 DIAGNOSIS — Z9013 Acquired absence of bilateral breasts and nipples: Secondary | ICD-10-CM | POA: Insufficient documentation

## 2022-04-09 DIAGNOSIS — Z5111 Encounter for antineoplastic chemotherapy: Secondary | ICD-10-CM | POA: Insufficient documentation

## 2022-04-09 DIAGNOSIS — Z79811 Long term (current) use of aromatase inhibitors: Secondary | ICD-10-CM | POA: Insufficient documentation

## 2022-04-09 LAB — CMP (CANCER CENTER ONLY)
ALT: 102 U/L — ABNORMAL HIGH (ref 0–44)
AST: 59 U/L — ABNORMAL HIGH (ref 15–41)
Albumin: 4.1 g/dL (ref 3.5–5.0)
Alkaline Phosphatase: 42 U/L (ref 38–126)
Anion gap: 6 (ref 5–15)
BUN: 19 mg/dL (ref 6–20)
CO2: 30 mmol/L (ref 22–32)
Calcium: 9.5 mg/dL (ref 8.9–10.3)
Chloride: 106 mmol/L (ref 98–111)
Creatinine: 0.61 mg/dL (ref 0.44–1.00)
GFR, Estimated: >60 mL/min (ref >60)
Glucose, Bld: 103 mg/dL — ABNORMAL HIGH (ref 70–99)
Potassium: 3.8 mmol/L (ref 3.5–5.1)
Sodium: 142 mmol/L (ref 135–145)
Total Bilirubin: 0.8 mg/dL (ref 0.3–1.2)
Total Protein: 7.1 g/dL (ref 6.5–8.1)

## 2022-04-09 LAB — CBC WITH DIFFERENTIAL (CANCER CENTER ONLY)
Abs Immature Granulocytes: 0.01 10*3/uL (ref 0.00–0.07)
Basophils Absolute: 0.1 10*3/uL (ref 0.0–0.1)
Basophils Relative: 1 %
Eosinophils Absolute: 0.1 10*3/uL (ref 0.0–0.5)
Eosinophils Relative: 2 %
HCT: 34 % — ABNORMAL LOW (ref 36.0–46.0)
Hemoglobin: 12.5 g/dL (ref 12.0–15.0)
Immature Granulocytes: 0 %
Lymphocytes Relative: 32 %
Lymphs Abs: 1.2 10*3/uL (ref 0.7–4.0)
MCH: 34.2 pg — ABNORMAL HIGH (ref 26.0–34.0)
MCHC: 36.8 g/dL — ABNORMAL HIGH (ref 30.0–36.0)
MCV: 92.9 fL (ref 80.0–100.0)
Monocytes Absolute: 0.3 10*3/uL (ref 0.1–1.0)
Monocytes Relative: 8 %
Neutro Abs: 2.2 10*3/uL (ref 1.7–7.7)
Neutrophils Relative %: 57 %
Platelet Count: 213 10*3/uL (ref 150–400)
RBC: 3.66 MIL/uL — ABNORMAL LOW (ref 3.87–5.11)
RDW: 12.1 % (ref 11.5–15.5)
WBC Count: 3.8 10*3/uL — ABNORMAL LOW (ref 4.0–10.5)
nRBC: 0 % (ref 0.0–0.2)

## 2022-04-09 MED ORDER — ZOLEDRONIC ACID 4 MG/100ML IV SOLN
4.0000 mg | Freq: Once | INTRAVENOUS | Status: AC
  Administered 2022-04-09: 4 mg via INTRAVENOUS
  Filled 2022-04-09: qty 100

## 2022-04-09 MED ORDER — SODIUM CHLORIDE 0.9 % IV SOLN: INTRAVENOUS | Status: DC

## 2022-04-09 MED ORDER — SODIUM CHLORIDE 0.9% FLUSH
10.0000 mL | Freq: Once | INTRAVENOUS | Status: AC
  Administered 2022-04-09: 10 mL

## 2022-04-09 MED ORDER — GOSERELIN ACETATE 3.6 MG ~~LOC~~ IMPL
3.6000 mg | DRUG_IMPLANT | Freq: Once | SUBCUTANEOUS | Status: AC
  Administered 2022-04-09: 3.6 mg via SUBCUTANEOUS
  Filled 2022-04-09: qty 3.6

## 2022-04-09 MED ORDER — HEPARIN SOD (PORK) LOCK FLUSH 100 UNIT/ML IV SOLN
500.0000 [IU] | Freq: Once | INTRAVENOUS | Status: AC
  Administered 2022-04-09: 500 [IU]

## 2022-04-09 MED ORDER — ANASTROZOLE 1 MG PO TABS
1.0000 mg | ORAL_TABLET | Freq: Every day | ORAL | 3 refills | Status: DC
  Filled 2022-04-09: qty 90, 90d supply, fill #0
  Filled 2022-07-05: qty 90, 90d supply, fill #1
  Filled 2022-09-30: qty 90, 90d supply, fill #2
  Filled 2022-12-30: qty 90, 90d supply, fill #3

## 2022-04-09 NOTE — Patient Instructions (Signed)
Goserelin Implant What is this medication? GOSERELIN (GOE se rel in) treats prostate cancer and breast cancer. It works by decreasing levels of the hormones testosterone and estrogen in the body. This prevents prostate and breast cancer cells from spreading or growing. It may also be used to treat endometriosis. This is a condition where the tissue that lines the uterus grows outside the uterus. It works by decreasing the amount of estrogen your body makes, which reduces heavy bleeding and pain. It can also be used to help thin the lining of the uterus before a surgery used to prevent or reduce heavy periods. This medicine may be used for other purposes; ask your health care provider or pharmacist if you have questions. COMMON BRAND NAME(S): Zoladex, Zoladex 15-Month What should I tell my care team before I take this medication? They need to know if you have any of these conditions: Bone problems Diabetes Heart disease History of irregular heartbeat or rhythm An unusual or allergic reaction to goserelin, other medications, foods, dyes, or preservatives Pregnant or trying to get pregnant Breastfeeding How should I use this medication? This medication is injected under the skin. It is given by your care team in a hospital or clinic setting. Talk to your care team about the use of this medication in children. Special care may be needed. Overdosage: If you think you have taken too much of this medicine contact a poison control center or emergency room at once. NOTE: This medicine is only for you. Do not share this medicine with others. What if I miss a dose? Keep appointments for follow-up doses. It is important not to miss your dose. Call your care team if you are unable to keep an appointment. What may interact with this medication? Do not take this medication with any of the following: Cisapride Dronedarone Pimozide Thioridazine This medication may also interact with the following: Other  medications that cause heart rhythm changes This list may not describe all possible interactions. Give your health care provider a list of all the medicines, herbs, non-prescription drugs, or dietary supplements you use. Also tell them if you smoke, drink alcohol, or use illegal drugs. Some items may interact with your medicine. What should I watch for while using this medication? Visit your care team for regular checks on your progress. Your symptoms may appear to get worse during the first weeks of this therapy. Tell your care team if your symptoms do not start to get better or if they get worse after this time. Using this medication for a long time may weaken your bones. If you smoke or frequently drink alcohol you may increase your risk of bone loss. A family history of osteoporosis, chronic use of medications for seizures (convulsions), or corticosteroids can also increase your risk of bone loss. The risk of bone fractures may be increased. Talk to your care team about your bone health. This medication may increase blood sugar. The risk may be higher in patients who already have diabetes. Ask your care team what you can do to lower your risk of diabetes while taking this medication. This medication should stop regular monthly menstruation in women. Tell your care team if you continue to menstruate. Talk to your care team if you wish to become pregnant or think you might be pregnant. This medication can cause serious birth defects if taken during pregnancy or for 12 weeks after stopping treatment. Talk to your care team about reliable forms of contraception. Do not breastfeed while taking this  medication. This medication may cause infertility. Talk to your care team if you are concerned about your fertility. What side effects may I notice from receiving this medication? Side effects that you should report to your care team as soon as possible: Allergic reactions--skin rash, itching, hives, swelling  of the face, lips, tongue, or throat Change in the amount of urine Heart attack--pain or tightness in the chest, shoulders, arms, or jaw, nausea, shortness of breath, cold or clammy skin, feeling faint or lightheaded Heart rhythm changes--fast or irregular heartbeat, dizziness, feeling faint or lightheaded, chest pain, trouble breathing High blood sugar (hyperglycemia)--increased thirst or amount of urine, unusual weakness or fatigue, blurry vision High calcium level--increased thirst or amount of urine, nausea, vomiting, confusion, unusual weakness or fatigue, bone pain Pain, redness, irritation, or bruising at the injection site Severe back pain, numbness or weakness of the hands, arms, legs, or feet, loss of coordination, loss of bowel or bladder control Stroke--sudden numbness or weakness of the face, arm, or leg, trouble speaking, confusion, trouble walking, loss of balance or coordination, dizziness, severe headache, change in vision Swelling and pain of the tumor site or lymph nodes Trouble passing urine Side effects that usually do not require medical attention (report to your care team if they continue or are bothersome): Change in sex drive or performance Headache Hot flashes Rapid or extreme change in emotion or mood Sweating Swelling of the ankles, hands, or feet Unusual vaginal discharge, itching, or odor This list may not describe all possible side effects. Call your doctor for medical advice about side effects. You may report side effects to FDA at 1-800-FDA-1088. Where should I keep my medication? This medication is given in a hospital or clinic. It will not be stored at home. NOTE: This sheet is a summary. It may not cover all possible information. If you have questions about this medicine, talk to your doctor, pharmacist, or health care provider.  2023 Elsevier/Gold Standard (2007-07-11 00:00:00)

## 2022-04-09 NOTE — Assessment & Plan Note (Signed)
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% 09/25/2020: Right breast biopsy: DCIS grade 1, ER/PR positive 10/06/2020  breast MRI showed a 11 cm area of malignancy, with a second 1.9 cm mass, and skin thickening concerning for inflammatory breast carcinoma; at least 6 abnormal lymph nodes noted 10/13/2020 CT chest shows clear lungs and no obvious liver involvement but multiple lytic bone lesions in addition to the findings and MRI above 10/21/2020 spine MRI: Shows widely scattered thoracic and lumbar vertebral body metastases 10/24/2020: 2 cycles of dose dense Adriamycin and Cytoxan discontinued because of rising LFTs 12/01/2020: MRI abdomen: No evidence of metastatic disease in the liver 01/31/2021:Left mastectomy: Residual multifocal IDC 2 cm with intermediate grade DCIS, 15/15 lymph nodes positive, margins negative, lymphovascular space invasion present, ER 60%, PR 10%, HER2 negative, Ki-67 15% Right mastectomy: Fibrocystic change no residual cancer or DCIS   Current treatment: Anastrozole with goserelin and Zometa and Verzenio   Anastrozole toxicities: Tolerating it very well Abemaciclib toxicities: Very occasional loose stools    08/14/2021: CT CAP and bone scan: Stable bone metastases 01/11/2022: CT CAP: Further decrease in the 8 mm left axillary lymph node.  Stable bone metastases 01/12/2022: Bone scan: 3 foci of uptake were identified in the pelvis.  Left posterior iliac crest uptake was not identified in the previous study.  The other 2 are stable   Radiology review: I did not consider the above findings to suggest progression of disease. Continue with the current treatment   She saw plastic surgery regarding reconstruction options.  She might consider the TRAM flap next year.  She will need to be off medications for significant length of time to enable her to get the reconstruction done.   Plan to perform additional  scans in 3 months.

## 2022-04-24 ENCOUNTER — Other Ambulatory Visit (HOSPITAL_COMMUNITY): Payer: Self-pay

## 2022-04-29 ENCOUNTER — Other Ambulatory Visit (HOSPITAL_COMMUNITY): Payer: Self-pay

## 2022-05-07 ENCOUNTER — Inpatient Hospital Stay: Payer: Medicaid Other | Attending: Oncology

## 2022-05-07 VITALS — BP 134/88 | HR 85 | Temp 98.5°F | Resp 18

## 2022-05-07 DIAGNOSIS — C50412 Malignant neoplasm of upper-outer quadrant of left female breast: Secondary | ICD-10-CM | POA: Diagnosis present

## 2022-05-07 DIAGNOSIS — Z79811 Long term (current) use of aromatase inhibitors: Secondary | ICD-10-CM | POA: Insufficient documentation

## 2022-05-07 DIAGNOSIS — Z170 Estrogen receptor positive status [ER+]: Secondary | ICD-10-CM | POA: Diagnosis not present

## 2022-05-07 DIAGNOSIS — Z5111 Encounter for antineoplastic chemotherapy: Secondary | ICD-10-CM | POA: Diagnosis present

## 2022-05-07 DIAGNOSIS — Z95828 Presence of other vascular implants and grafts: Secondary | ICD-10-CM

## 2022-05-07 MED ORDER — SODIUM CHLORIDE 0.9% FLUSH
10.0000 mL | Freq: Once | INTRAVENOUS | Status: AC
  Administered 2022-05-07: 10 mL

## 2022-05-07 MED ORDER — HEPARIN SOD (PORK) LOCK FLUSH 100 UNIT/ML IV SOLN
500.0000 [IU] | Freq: Once | INTRAVENOUS | Status: AC
  Administered 2022-05-07: 500 [IU]

## 2022-05-07 MED ORDER — GOSERELIN ACETATE 3.6 MG ~~LOC~~ IMPL
3.6000 mg | DRUG_IMPLANT | Freq: Once | SUBCUTANEOUS | Status: AC
  Administered 2022-05-07: 3.6 mg via SUBCUTANEOUS

## 2022-05-21 ENCOUNTER — Other Ambulatory Visit: Payer: Self-pay

## 2022-05-23 ENCOUNTER — Other Ambulatory Visit (HOSPITAL_COMMUNITY): Payer: Self-pay

## 2022-05-23 ENCOUNTER — Other Ambulatory Visit: Payer: Self-pay

## 2022-06-04 ENCOUNTER — Inpatient Hospital Stay: Payer: Medicaid Other | Attending: Oncology

## 2022-06-04 VITALS — BP 144/88 | HR 72 | Temp 98.9°F | Resp 20

## 2022-06-04 DIAGNOSIS — Z95828 Presence of other vascular implants and grafts: Secondary | ICD-10-CM

## 2022-06-04 DIAGNOSIS — Z170 Estrogen receptor positive status [ER+]: Secondary | ICD-10-CM | POA: Insufficient documentation

## 2022-06-04 DIAGNOSIS — C50412 Malignant neoplasm of upper-outer quadrant of left female breast: Secondary | ICD-10-CM | POA: Insufficient documentation

## 2022-06-04 DIAGNOSIS — Z5111 Encounter for antineoplastic chemotherapy: Secondary | ICD-10-CM | POA: Diagnosis present

## 2022-06-04 MED ORDER — GOSERELIN ACETATE 3.6 MG ~~LOC~~ IMPL
3.6000 mg | DRUG_IMPLANT | Freq: Once | SUBCUTANEOUS | Status: AC
  Administered 2022-06-04: 3.6 mg via SUBCUTANEOUS
  Filled 2022-06-04: qty 3.6

## 2022-06-17 ENCOUNTER — Other Ambulatory Visit (HOSPITAL_COMMUNITY): Payer: Self-pay

## 2022-06-18 ENCOUNTER — Other Ambulatory Visit: Payer: Self-pay | Admitting: Hematology and Oncology

## 2022-06-18 ENCOUNTER — Other Ambulatory Visit (HOSPITAL_COMMUNITY): Payer: Self-pay

## 2022-06-18 MED ORDER — ABEMACICLIB 100 MG PO TABS
100.0000 mg | ORAL_TABLET | Freq: Two times a day (BID) | ORAL | 3 refills | Status: DC
  Filled 2022-06-18: qty 56, 28d supply, fill #0
  Filled 2022-07-16: qty 56, 28d supply, fill #1
  Filled 2022-08-07: qty 56, 28d supply, fill #2
  Filled 2022-09-04: qty 56, 28d supply, fill #3

## 2022-06-20 DIAGNOSIS — E119 Type 2 diabetes mellitus without complications: Secondary | ICD-10-CM

## 2022-06-21 ENCOUNTER — Other Ambulatory Visit (HOSPITAL_COMMUNITY): Payer: Self-pay

## 2022-06-21 DIAGNOSIS — K7581 Nonalcoholic steatohepatitis (NASH): Secondary | ICD-10-CM | POA: Diagnosis present

## 2022-06-24 ENCOUNTER — Ambulatory Visit: Payer: Medicaid Other | Attending: Surgery

## 2022-06-24 VITALS — Wt 196.4 lb

## 2022-06-24 DIAGNOSIS — Z483 Aftercare following surgery for neoplasm: Secondary | ICD-10-CM | POA: Insufficient documentation

## 2022-06-24 NOTE — Therapy (Signed)
OUTPATIENT PHYSICAL THERAPY SOZO SCREENING NOTE   Patient Name: Gabriela Sullivan MRN: [id number] DOB:11/14/1990, 32 y.o., female Today's Date: 06/24/2022  PCP: [name], MD REFERRING PROVIDER: Erroll Luna, MD   PT End of Session - 06/24/22 1600     Visit Number 8   # unchanged due to screen only   PT Start Time 1557    PT Stop Time 1602    PT Time Calculation (min) 5 min    Activity Tolerance Patient tolerated treatment well    Behavior During Therapy [facility] for tasks assessed/performed             Past Medical History:  Diagnosis Date   Asthma    remote   Breast cancer ([place])    Elevated liver enzymes    Family history of breast cancer    Family history of ovarian cancer    Family history of prostate cancer    Family history of stomach cancer    Hepatic steatosis    Hyperglycemia    A1c 7.6% 12/19/20   Migraines    PCOS (polycystic ovarian syndrome)    Pre-diabetes    Past Surgical History:  Procedure Laterality Date   MASTECTOMY W/ SENTINEL NODE BIOPSY Right 01/31/2021   Procedure: RIGHT SIMPLE MASTECTOMY;  Surgeon: Erroll Luna, MD;  Location: [place];  Service: General;  Laterality: Right;   MODIFIED MASTECTOMY Left 01/31/2021   Procedure: LEFT MODIFIED RADICAL MASTECTOMY;  Surgeon: Erroll Luna, MD;  Location: [place];  Service: General;  Laterality: Left;   NO PAST SURGERIES     PORTACATH PLACEMENT Right 10/18/2020   Procedure: INSERTION PORT-A-CATH;  Surgeon: Erroll Luna, MD;  Location: [place];  Service: General;  Laterality: Right;   Patient Active Problem List   Diagnosis Date Noted   Left breast cancer with T3 tumor, >5 cm in greatest dimension ([place]) 01/31/2021   Bone metastases 12/28/2020   Port-A-Cath in place 12/06/2020   Genetic testing 11/25/2020   Goals of care, counseling/discussion 10/25/2020   Lytic bone lesions on xray 10/16/2020   Family history of ovarian cancer    Family history of prostate cancer    Family  history of breast cancer    Family history of stomach cancer    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   Malignant neoplasm of upper-outer quadrant of left breast in female, estrogen receptor positive ([place]) 09/29/2020   Elevated LFTs     REFERRING DIAG: left breast cancer at risk for lymphedema  THERAPY DIAG: Aftercare following surgery for neoplasm  PERTINENT HISTORY: Patient was diagnosed on 09/26/2020 with 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 It is ER/PR positive and HER2 negative with a Ki67 of 15%. She underwent neoadjuvant chemotherapy beginning 10/24/2020 for 2 rounds which was stopped due to elevated LFTs. She had a bilateral mastectomy with left axillary node dissection on 01/31/2021 with 15/15 nodes positive. She started Anastrozole on 12/06/2020. 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   PRECAUTIONS: left UE Lymphedema risk, None  SUBJECTIVE: Pt returns for her 3 month L-Dex screen.   PAIN:  Are you having pain? No  SOZO SCREENING: Patient was assessed today using the SOZO machine to determine the lymphedema index score. This was compared to her baseline score. It was determined that she is within the recommended range when compared to her baseline and no further action is needed at this time. She  will continue SOZO screenings. These are done every 3 months for 2 years post operatively followed by every 6 months for 2 years, and then annually.  Plan: Cont every 3 month L-Dex screens for up to 2 years from her Lt ALND (~02/01/2023)   L-DEX FLOWSHEETS - 06/24/22 1600       L-DEX LYMPHEDEMA SCREENING   Measurement Type Unilateral    L-DEX MEASUREMENT EXTREMITY Upper Extremity    POSITION  Standing    DOMINANT SIDE Right    At Risk Side Left    BASELINE SCORE (UNILATERAL) 2.9    L-DEX SCORE (UNILATERAL) 5    VALUE CHANGE (UNILAT) 2.1              Otelia Limes, PTA 06/24/2022, 4:02 PM

## 2022-07-01 ENCOUNTER — Ambulatory Visit (HOSPITAL_COMMUNITY)
Admission: RE | Admit: 2022-07-01 | Discharge: 2022-07-01 | Disposition: A | Discharge status: Home / Self Care | Payer: Medicaid Other | Source: Ambulatory Visit | Attending: Hematology and Oncology | Admitting: Hematology and Oncology

## 2022-07-01 ENCOUNTER — Encounter (HOSPITAL_COMMUNITY): Payer: Self-pay

## 2022-07-01 DIAGNOSIS — C50412 Malignant neoplasm of upper-outer quadrant of left female breast: Secondary | ICD-10-CM | POA: Insufficient documentation

## 2022-07-01 DIAGNOSIS — C50912 Malignant neoplasm of unspecified site of left female breast: Secondary | ICD-10-CM

## 2022-07-01 DIAGNOSIS — Z170 Estrogen receptor positive status [ER+]: Secondary | ICD-10-CM | POA: Diagnosis present

## 2022-07-01 LAB — POCT I-STAT CREATININE: Creatinine, Ser: 0.3 mg/dL — ABNORMAL LOW (ref 0.44–1.00)

## 2022-07-01 MED ORDER — IOHEXOL 300 MG/ML  SOLN
100.0000 mL | Freq: Once | INTRAMUSCULAR | Status: AC | PRN
  Administered 2022-07-01: 100 mL via INTRAVENOUS

## 2022-07-01 MED ORDER — SODIUM CHLORIDE (PF) 0.9 % IJ SOLN
INTRAMUSCULAR | Status: AC
  Filled 2022-07-01: qty 50

## 2022-07-01 MED ORDER — HEPARIN SOD (PORK) LOCK FLUSH 100 UNIT/ML IV SOLN
500.0000 [IU] | Freq: Once | INTRAVENOUS | Status: AC
  Administered 2022-07-01: 500 [IU] via INTRAVENOUS

## 2022-07-01 MED ORDER — HEPARIN SOD (PORK) LOCK FLUSH 100 UNIT/ML IV SOLN
INTRAVENOUS | Status: AC
  Filled 2022-07-01: qty 5

## 2022-07-01 NOTE — Progress Notes (Signed)
Patient Care Team: [name], MD as PCP - General (Family Medicine) Rockwell Germany, RN as Oncology Nurse Navigator Mauro Kaufmann, RN as Oncology Nurse Navigator Erroll Luna, MD as Consulting Physician (General Surgery) Kyung Rudd, MD as Consulting Physician (Radiation Oncology) Nicholas Lose, MD as Consulting Physician (Hematology and Oncology)  DIAGNOSIS: No diagnosis found.  SUMMARY OF ONCOLOGIC HISTORY: Oncology History  Ductal carcinoma in situ (DCIS) of right breast  09/29/2020 Initial Diagnosis   Ductal carcinoma in situ (DCIS) of right breast   10/04/2020 Cancer Staging   Staging form: Breast, AJCC 8th Edition - Clinical: Stage 0 (cTis (DCIS), cN0, cM0) - Signed by Chauncey Cruel, MD on 10/04/2020   11/25/2020 Genetic Testing   Negative genetic testing:  No pathogenic variants detected on the [employer] CancerNext-Expanded + RNAinsight panel. The report date is 11/25/2020.   The CancerNext-Expanded + RNAinsight gene panel offered by [employer] and includes sequencing and rearrangement analysis for the following 77 genes: AIP, ALK, APC, ATM, AXIN2, BAP1, BARD1, BLM, BMPR1A, BRCA1, BRCA2, BRIP1, CDC73, CDH1, CDK4, CDKN1B, CDKN2A, CHEK2, CTNNA1, DICER1, FANCC, FH, FLCN, GALNT12, KIF1B, LZTR1, MAX, MEN1, MET, MLH1, MSH2, MSH3, MSH6, MUTYH, NBN, NF1, NF2, NTHL1, PALB2, PHOX2B, PMS2, POT1, PRKAR1A, PTCH1, PTEN, RAD51C, RAD51D, RB1, RECQL, RET, SDHA, SDHAF2, SDHB, SDHC, SDHD, SMAD4, SMARCA4, SMARCB1, SMARCE1, STK11, SUFU, TMEM127, TP53, TSC1, TSC2, VHL and XRCC2 (sequencing and deletion/duplication); EGFR, EGLN1, HOXB13, KIT, MITF, PDGFRA, POLD1 and POLE (sequencing only); EPCAM and GREM1 (deletion/duplication only). RNA data is routinely analyzed for use in variant interpretation for all genes.   Malignant neoplasm of upper-outer quadrant of left breast in female, estrogen receptor positive ([place])  09/29/2020 Initial Diagnosis   Malignant neoplasm of upper-outer quadrant  of left breast in female, estrogen receptor positive ([place])   10/04/2020 Cancer Staging   Staging form: Breast, AJCC 8th Edition - Clinical stage from 10/04/2020: Stage IIA (cT3, cN1(f), cM0, G2, ER+, PR+, HER2-) - Signed by Chauncey Cruel, MD on 10/04/2020 Stage prefix: Initial diagnosis Method of lymph node assessment: Core biopsy Histologic grading system: 3 grade system   10/25/2020 - 11/10/2020 Chemotherapy   2 cycles of Adriamycin and Cytoxan.  Chemo discontinued because of rising LFTs.       11/25/2020 Genetic Testing   Negative genetic testing:  No pathogenic variants detected on the [facility] CancerNext-Expanded + RNAinsight panel. The report date is 11/25/2020.   The CancerNext-Expanded + RNAinsight gene panel offered by [employer] and includes sequencing and rearrangement analysis for the following 77 genes: AIP, ALK, APC, ATM, AXIN2, BAP1, BARD1, BLM, BMPR1A, BRCA1, BRCA2, BRIP1, CDC73, CDH1, CDK4, CDKN1B, CDKN2A, CHEK2, CTNNA1, DICER1, FANCC, FH, FLCN, GALNT12, KIF1B, LZTR1, MAX, MEN1, MET, MLH1, MSH2, MSH3, MSH6, MUTYH, NBN, NF1, NF2, NTHL1, PALB2, PHOX2B, PMS2, POT1, PRKAR1A, PTCH1, PTEN, RAD51C, RAD51D, RB1, RECQL, RET, SDHA, SDHAF2, SDHB, SDHC, SDHD, SMAD4, SMARCA4, SMARCB1, SMARCE1, STK11, SUFU, TMEM127, TP53, TSC1, TSC2, VHL and XRCC2 (sequencing and deletion/duplication); EGFR, EGLN1, HOXB13, KIT, MITF, PDGFRA, POLD1 and POLE (sequencing only); EPCAM and GREM1 (deletion/duplication only). RNA data is routinely analyzed for use in variant interpretation for all genes.    Anti-estrogen oral therapy   anastrozole started 12/06/2020 goserelin started 10/11/2020  Verzinio started Aug 2022 zolendronate started 11/02/2018. Every 12 weeks.   01/31/2021 Surgery   Left mastectomy: Residual multifocal IDC 2 cm with intermediate grade DCIS, 15/15 lymph nodes positive, margins negative, lymphovascular space invasion present, ER 60%, PR 10%, HER2 negative, Ki-67 15% Right mastectomy:  Fibrocystic change  no residual cancer or DCIS     CHIEF COMPLIANT:   INTERVAL HISTORY: Gabriela Sullivan is a   ALLERGIES:  has No Known Allergies.  MEDICATIONS:  Current Outpatient Medications  Medication Sig Dispense Refill   abemaciclib (VERZENIO) 100 MG tablet Take 1 tablet (100 mg total) by mouth 2 (two) times daily. Swallow tablets whole. Do not chew, crush, or split tablets before swallowing. 56 tablet 3   anastrozole (ARIMIDEX) 1 MG tablet Take 1 tablet (1 mg total) by mouth daily. 90 tablet 3   calcium carbonate (TUMS - DOSED IN MG ELEMENTAL CALCIUM) 500 MG chewable tablet Chew 2 tablets by mouth every other day.     cholecalciferol (VITAMIN D3) 25 MCG (1000 UNIT) tablet Take 1,000 Units by mouth daily.     Goserelin Acetate (ZOLADEX [place]) Inject 1 Dose into the skin every 30 (thirty) days. 10/24/21 was her last dose     omeprazole (PRILOSEC) 20 MG capsule Take 1 capsule (20 mg total) by mouth daily. 30 capsule 0   ondansetron (ZOFRAN-ODT) 4 MG disintegrating tablet Take 1 tablet (4 mg total) by mouth every 8 (eight) hours as needed for nausea or vomiting. 8 tablet 0   potassium chloride SA (KLOR-CON M) 20 MEQ tablet Take 1 tablet (20 mEq total) by mouth 2 (two) times daily. 30 tablet 2   Semaglutide (OZEMPIC, 0.25 OR 0.5 MG/DOSE, [place]) Inject 0.25 mg into the skin once a week. mondays     No current facility-administered medications for this visit.   Facility-Administered Medications Ordered in Other Visits  Medication Dose Route Frequency Provider Last Rate Last Admin   heparin lock flush 100 UNIT/ML injection            sodium chloride (PF) 0.9 % injection             PHYSICAL EXAMINATION: ECOG PERFORMANCE STATUS: {CHL ONC ECOG PS:[phone]}  There were no vitals filed for this visit. There were no vitals filed for this visit.  BREAST:*** No palpable masses or nodules in either right or left breasts. No palpable axillary supraclavicular or infraclavicular adenopathy no breast  tenderness or nipple discharge. (exam performed in the presence of a chaperone)  LABORATORY DATA:  I have reviewed the data as listed    Latest Ref Rng & Units 07/01/2022    2:32 PM 04/09/2022    1:14 PM 03/12/2022   11:13 AM  CMP  Glucose 70 - 99 mg/dL  103  182   BUN 6 - 20 mg/dL  19  14   Creatinine 0.44 - 1.00 mg/dL 0.30  0.61  0.52   Sodium 135 - 145 mmol/L  142  137   Potassium 3.5 - 5.1 mmol/L  3.8  3.8   Chloride 98 - 111 mmol/L  106  107   CO2 22 - 32 mmol/L  30  27   Calcium 8.9 - 10.3 mg/dL  9.5  8.8   Total Protein 6.5 - 8.1 g/dL  7.1  7.4   Total Bilirubin 0.3 - 1.2 mg/dL  0.8  0.6   Alkaline Phos 38 - 126 U/L  42  40   AST 15 - 41 U/L  59  82   ALT 0 - 44 U/L  102  144     Lab Results  Component Value Date   WBC 3.8 (L) 04/09/2022   HGB 12.5 04/09/2022   HCT 34.0 (L) 04/09/2022   MCV 92.9 04/09/2022   PLT 213 04/09/2022   NEUTROABS  2.2 04/09/2022    ASSESSMENT & PLAN:  No problem-specific Assessment & Plan notes found for this encounter.    No orders of the defined types were placed in this encounter.  The patient has a good understanding of the overall plan. she agrees with it. she will call with any problems that may develop before the next visit here. Total time spent: 30 mins including face to face time and time spent for planning, charting and co-ordination of care   Suzzette Righter, [place] 07/01/22    I Gardiner Coins am acting as a Education administrator for [employer]  ***

## 2022-07-02 ENCOUNTER — Inpatient Hospital Stay (HOSPITAL_BASED_OUTPATIENT_CLINIC_OR_DEPARTMENT_OTHER): Payer: Medicaid Other | Admitting: Hematology and Oncology

## 2022-07-02 ENCOUNTER — Inpatient Hospital Stay: Payer: Medicaid Other

## 2022-07-02 VITALS — BP 128/78 | HR 58 | Temp 97.5°F | Resp 18 | Wt 195.6 lb

## 2022-07-02 DIAGNOSIS — C50412 Malignant neoplasm of upper-outer quadrant of left female breast: Secondary | ICD-10-CM

## 2022-07-02 DIAGNOSIS — Z95828 Presence of other vascular implants and grafts: Secondary | ICD-10-CM

## 2022-07-02 DIAGNOSIS — Z170 Estrogen receptor positive status [ER+]: Secondary | ICD-10-CM

## 2022-07-02 DIAGNOSIS — Z5111 Encounter for antineoplastic chemotherapy: Secondary | ICD-10-CM | POA: Diagnosis not present

## 2022-07-02 LAB — CMP (CANCER CENTER ONLY)
ALT: 95 U/L — ABNORMAL HIGH (ref 0–44)
AST: 55 U/L — ABNORMAL HIGH (ref 15–41)
Albumin: 3.9 g/dL (ref 3.5–5.0)
Alkaline Phosphatase: 54 U/L (ref 38–126)
Anion gap: 4 — ABNORMAL LOW (ref 5–15)
BUN: 17 mg/dL (ref 6–20)
CO2: 29 mmol/L (ref 22–32)
Calcium: 9.4 mg/dL (ref 8.9–10.3)
Chloride: 107 mmol/L (ref 98–111)
Creatinine: 0.78 mg/dL (ref 0.44–1.00)
GFR, Estimated: >60 mL/min (ref >60)
Glucose, Bld: 152 mg/dL — ABNORMAL HIGH (ref 70–99)
Potassium: 3.9 mmol/L (ref 3.5–5.1)
Sodium: 140 mmol/L (ref 135–145)
Total Bilirubin: 0.6 mg/dL (ref 0.3–1.2)
Total Protein: 7.1 g/dL (ref 6.5–8.1)

## 2022-07-02 LAB — CBC WITH DIFFERENTIAL (CANCER CENTER ONLY)
Abs Immature Granulocytes: 0.02 10*3/uL (ref 0.00–0.07)
Basophils Absolute: 0.1 10*3/uL (ref 0.0–0.1)
Basophils Relative: 1 %
Eosinophils Absolute: 0.1 10*3/uL (ref 0.0–0.5)
Eosinophils Relative: 2 %
HCT: 37.1 % (ref 36.0–46.0)
Hemoglobin: 13.5 g/dL (ref 12.0–15.0)
Immature Granulocytes: 1 %
Lymphocytes Relative: 28 %
Lymphs Abs: 1.2 10*3/uL (ref 0.7–4.0)
MCH: 33.8 pg (ref 26.0–34.0)
MCHC: 36.4 g/dL — ABNORMAL HIGH (ref 30.0–36.0)
MCV: 92.8 fL (ref 80.0–100.0)
Monocytes Absolute: 0.3 10*3/uL (ref 0.1–1.0)
Monocytes Relative: 6 %
Neutro Abs: 2.6 10*3/uL (ref 1.7–7.7)
Neutrophils Relative %: 62 %
Platelet Count: 223 10*3/uL (ref 150–400)
RBC: 4 MIL/uL (ref 3.87–5.11)
RDW: 12.7 % (ref 11.5–15.5)
WBC Count: 4.2 10*3/uL (ref 4.0–10.5)
nRBC: 0 % (ref 0.0–0.2)

## 2022-07-02 MED ORDER — ZOLEDRONIC ACID 4 MG/100ML IV SOLN
4.0000 mg | Freq: Once | INTRAVENOUS | Status: AC
  Administered 2022-07-02: 4 mg via INTRAVENOUS
  Filled 2022-07-02: qty 100

## 2022-07-02 MED ORDER — SODIUM CHLORIDE 0.9% FLUSH
10.0000 mL | Freq: Once | INTRAVENOUS | Status: AC
  Administered 2022-07-02: 10 mL

## 2022-07-02 MED ORDER — HEPARIN SOD (PORK) LOCK FLUSH 100 UNIT/ML IV SOLN
500.0000 [IU] | Freq: Once | INTRAVENOUS | Status: AC
  Administered 2022-07-02: 500 [IU]

## 2022-07-02 MED ORDER — SODIUM CHLORIDE 0.9 % IV SOLN: INTRAVENOUS | Status: DC

## 2022-07-02 NOTE — Patient Instructions (Signed)

## 2022-07-02 NOTE — Assessment & Plan Note (Signed)
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% 09/25/2020: Right breast biopsy: DCIS grade 1, ER/PR positive 10/06/2020  breast MRI showed a 11 cm area of malignancy, with a second 1.9 cm mass, and skin thickening concerning for inflammatory breast carcinoma; at least 6 abnormal lymph nodes noted 10/13/2020 CT chest shows clear lungs and no obvious liver involvement but multiple lytic bone lesions in addition to the findings and MRI above 10/21/2020 spine MRI: Shows widely scattered thoracic and lumbar vertebral body metastases 10/24/2020: 2 cycles of dose dense Adriamycin and Cytoxan discontinued because of rising LFTs 12/01/2020: MRI abdomen: No evidence of metastatic disease in the liver 01/31/2021:Left mastectomy: Residual multifocal IDC 2 cm with intermediate grade DCIS, 15/15 lymph nodes positive, margins negative, lymphovascular space invasion present, ER 60%, PR 10%, HER2 negative, Ki-67 15% Right mastectomy: Fibrocystic change no residual cancer or DCIS   Current treatment: Anastrozole with goserelin and Verzenio and Zometa    Anastrozole toxicities: Tolerating it very well      08/14/2021: CT CAP and bone scan: Stable bone metastases 01/11/2022: CT CAP: Further decrease in the 8 mm left axillary lymph node.  Stable bone metastases 01/12/2022: Bone scan: 3 foci of uptake were identified in the pelvis.  Left posterior iliac crest uptake was not identified in the previous study.  The other 2 are stable 07/01/2022: Stable bone metastases similar 8 mm left axillary lymph node.   Radiology review: Stable disease Continue with the current treatment

## 2022-07-04 ENCOUNTER — Telehealth: Payer: Self-pay | Admitting: Hematology and Oncology

## 2022-07-04 NOTE — Telephone Encounter (Signed)
Contacted patient to scheduled appointments. Patient is aware of appointments that are scheduled.   

## 2022-07-05 ENCOUNTER — Inpatient Hospital Stay: Payer: Medicaid Other | Attending: Oncology

## 2022-07-05 ENCOUNTER — Other Ambulatory Visit: Payer: Self-pay

## 2022-07-05 VITALS — BP 125/79 | HR 81 | Temp 98.5°F | Resp 20

## 2022-07-05 DIAGNOSIS — Z170 Estrogen receptor positive status [ER+]: Secondary | ICD-10-CM | POA: Diagnosis not present

## 2022-07-05 DIAGNOSIS — Z5111 Encounter for antineoplastic chemotherapy: Secondary | ICD-10-CM | POA: Insufficient documentation

## 2022-07-05 DIAGNOSIS — C50412 Malignant neoplasm of upper-outer quadrant of left female breast: Secondary | ICD-10-CM | POA: Diagnosis present

## 2022-07-05 DIAGNOSIS — Z95828 Presence of other vascular implants and grafts: Secondary | ICD-10-CM

## 2022-07-05 DIAGNOSIS — Z79811 Long term (current) use of aromatase inhibitors: Secondary | ICD-10-CM | POA: Diagnosis not present

## 2022-07-05 MED ORDER — GOSERELIN ACETATE 3.6 MG ~~LOC~~ IMPL
3.6000 mg | DRUG_IMPLANT | Freq: Once | SUBCUTANEOUS | Status: AC
  Administered 2022-07-05: 3.6 mg via SUBCUTANEOUS
  Filled 2022-07-05: qty 3.6

## 2022-07-08 ENCOUNTER — Other Ambulatory Visit (HOSPITAL_COMMUNITY): Payer: Self-pay

## 2022-07-16 ENCOUNTER — Other Ambulatory Visit (HOSPITAL_COMMUNITY): Payer: Self-pay

## 2022-08-01 ENCOUNTER — Inpatient Hospital Stay: Payer: Medicaid Other

## 2022-08-01 ENCOUNTER — Other Ambulatory Visit: Payer: Self-pay | Admitting: Hematology and Oncology

## 2022-08-01 VITALS — BP 138/80 | HR 70 | Temp 99.0°F | Resp 20

## 2022-08-01 DIAGNOSIS — Z95828 Presence of other vascular implants and grafts: Secondary | ICD-10-CM

## 2022-08-01 DIAGNOSIS — Z170 Estrogen receptor positive status [ER+]: Secondary | ICD-10-CM

## 2022-08-01 DIAGNOSIS — Z5111 Encounter for antineoplastic chemotherapy: Secondary | ICD-10-CM | POA: Diagnosis not present

## 2022-08-01 MED ORDER — GOSERELIN ACETATE 3.6 MG ~~LOC~~ IMPL
3.6000 mg | DRUG_IMPLANT | Freq: Once | SUBCUTANEOUS | Status: AC
  Administered 2022-08-01: 3.6 mg via SUBCUTANEOUS
  Filled 2022-08-01: qty 3.6

## 2022-08-05 ENCOUNTER — Encounter: Payer: Self-pay | Admitting: Gastroenterology

## 2022-08-07 ENCOUNTER — Other Ambulatory Visit (HOSPITAL_COMMUNITY): Payer: Self-pay

## 2022-08-09 ENCOUNTER — Other Ambulatory Visit (HOSPITAL_COMMUNITY): Payer: Self-pay

## 2022-08-29 ENCOUNTER — Inpatient Hospital Stay: Payer: Medicaid Other | Attending: Oncology

## 2022-08-29 VITALS — BP 122/81 | HR 79 | Temp 98.6°F | Resp 18

## 2022-08-29 DIAGNOSIS — Z95828 Presence of other vascular implants and grafts: Secondary | ICD-10-CM

## 2022-08-29 DIAGNOSIS — Z5111 Encounter for antineoplastic chemotherapy: Secondary | ICD-10-CM | POA: Diagnosis not present

## 2022-08-29 DIAGNOSIS — C50412 Malignant neoplasm of upper-outer quadrant of left female breast: Secondary | ICD-10-CM | POA: Diagnosis present

## 2022-08-29 DIAGNOSIS — C7951 Secondary malignant neoplasm of bone: Secondary | ICD-10-CM | POA: Diagnosis not present

## 2022-08-29 DIAGNOSIS — Z79811 Long term (current) use of aromatase inhibitors: Secondary | ICD-10-CM | POA: Insufficient documentation

## 2022-08-29 DIAGNOSIS — Z170 Estrogen receptor positive status [ER+]: Secondary | ICD-10-CM | POA: Diagnosis not present

## 2022-08-29 MED ORDER — GOSERELIN ACETATE 3.6 MG ~~LOC~~ IMPL
3.6000 mg | DRUG_IMPLANT | Freq: Once | SUBCUTANEOUS | Status: AC
  Administered 2022-08-29: 3.6 mg via SUBCUTANEOUS
  Filled 2022-08-29: qty 3.6

## 2022-09-04 ENCOUNTER — Other Ambulatory Visit (HOSPITAL_COMMUNITY): Payer: Self-pay

## 2022-09-11 ENCOUNTER — Other Ambulatory Visit (HOSPITAL_COMMUNITY): Payer: Self-pay

## 2022-09-18 ENCOUNTER — Telehealth: Payer: Self-pay | Admitting: Hematology and Oncology

## 2022-09-26 ENCOUNTER — Inpatient Hospital Stay: Payer: Medicaid Other

## 2022-09-26 ENCOUNTER — Inpatient Hospital Stay: Payer: Medicaid Other | Admitting: Hematology and Oncology

## 2022-09-27 ENCOUNTER — Other Ambulatory Visit: Payer: Self-pay

## 2022-09-27 DIAGNOSIS — Z170 Estrogen receptor positive status [ER+]: Secondary | ICD-10-CM

## 2022-09-30 ENCOUNTER — Other Ambulatory Visit (HOSPITAL_COMMUNITY): Payer: Self-pay

## 2022-09-30 ENCOUNTER — Other Ambulatory Visit: Payer: Self-pay | Admitting: Hematology and Oncology

## 2022-09-30 ENCOUNTER — Inpatient Hospital Stay: Payer: Medicaid Other

## 2022-09-30 ENCOUNTER — Other Ambulatory Visit: Payer: Self-pay

## 2022-09-30 ENCOUNTER — Inpatient Hospital Stay: Payer: Medicaid Other | Attending: Oncology | Admitting: Hematology and Oncology

## 2022-09-30 VITALS — BP 145/81 | HR 88 | Temp 97.3°F | Resp 18 | Ht 60.5 in | Wt 195.0 lb

## 2022-09-30 VITALS — BP 123/67 | HR 77 | Temp 98.5°F | Resp 18

## 2022-09-30 DIAGNOSIS — D0511 Intraductal carcinoma in situ of right breast: Secondary | ICD-10-CM | POA: Diagnosis not present

## 2022-09-30 DIAGNOSIS — Z79811 Long term (current) use of aromatase inhibitors: Secondary | ICD-10-CM | POA: Insufficient documentation

## 2022-09-30 DIAGNOSIS — Z170 Estrogen receptor positive status [ER+]: Secondary | ICD-10-CM | POA: Insufficient documentation

## 2022-09-30 DIAGNOSIS — Z5111 Encounter for antineoplastic chemotherapy: Secondary | ICD-10-CM | POA: Insufficient documentation

## 2022-09-30 DIAGNOSIS — C50412 Malignant neoplasm of upper-outer quadrant of left female breast: Secondary | ICD-10-CM | POA: Insufficient documentation

## 2022-09-30 DIAGNOSIS — C7951 Secondary malignant neoplasm of bone: Secondary | ICD-10-CM | POA: Insufficient documentation

## 2022-09-30 DIAGNOSIS — Z95828 Presence of other vascular implants and grafts: Secondary | ICD-10-CM

## 2022-09-30 DIAGNOSIS — Z9013 Acquired absence of bilateral breasts and nipples: Secondary | ICD-10-CM | POA: Diagnosis not present

## 2022-09-30 LAB — CMP (CANCER CENTER ONLY)
ALT: 67 U/L — ABNORMAL HIGH (ref 0–44)
AST: 52 U/L — ABNORMAL HIGH (ref 15–41)
Albumin: 4.1 g/dL (ref 3.5–5.0)
Alkaline Phosphatase: 50 U/L (ref 38–126)
Anion gap: 7 (ref 5–15)
BUN: 15 mg/dL (ref 6–20)
CO2: 29 mmol/L (ref 22–32)
Calcium: 9.5 mg/dL (ref 8.9–10.3)
Chloride: 108 mmol/L (ref 98–111)
Creatinine: 0.6 mg/dL (ref 0.44–1.00)
GFR, Estimated: >60 mL/min (ref >60)
Glucose, Bld: 122 mg/dL — ABNORMAL HIGH (ref 70–99)
Potassium: 3.5 mmol/L (ref 3.5–5.1)
Sodium: 144 mmol/L (ref 135–145)
Total Bilirubin: 0.6 mg/dL (ref 0.3–1.2)
Total Protein: 7 g/dL (ref 6.5–8.1)

## 2022-09-30 LAB — CBC WITH DIFFERENTIAL (CANCER CENTER ONLY)
Abs Immature Granulocytes: 0.01 10*3/uL (ref 0.00–0.07)
Basophils Absolute: 0 10*3/uL (ref 0.0–0.1)
Basophils Relative: 1 %
Eosinophils Absolute: 0.1 10*3/uL (ref 0.0–0.5)
Eosinophils Relative: 2 %
HCT: 35.3 % — ABNORMAL LOW (ref 36.0–46.0)
Hemoglobin: 13 g/dL (ref 12.0–15.0)
Immature Granulocytes: 0 %
Lymphocytes Relative: 33 %
Lymphs Abs: 1.3 10*3/uL (ref 0.7–4.0)
MCH: 34.2 pg — ABNORMAL HIGH (ref 26.0–34.0)
MCHC: 36.8 g/dL — ABNORMAL HIGH (ref 30.0–36.0)
MCV: 92.9 fL (ref 80.0–100.0)
Monocytes Absolute: 0.2 10*3/uL (ref 0.1–1.0)
Monocytes Relative: 6 %
Neutro Abs: 2.2 10*3/uL (ref 1.7–7.7)
Neutrophils Relative %: 58 %
Platelet Count: 221 10*3/uL (ref 150–400)
RBC: 3.8 MIL/uL — ABNORMAL LOW (ref 3.87–5.11)
RDW: 13.2 % (ref 11.5–15.5)
WBC Count: 3.8 10*3/uL — ABNORMAL LOW (ref 4.0–10.5)
nRBC: 0 % (ref 0.0–0.2)

## 2022-09-30 MED ORDER — ZOLEDRONIC ACID 4 MG/100ML IV SOLN
4.0000 mg | Freq: Once | INTRAVENOUS | Status: AC
  Administered 2022-09-30: 4 mg via INTRAVENOUS
  Filled 2022-09-30: qty 100

## 2022-09-30 MED ORDER — SODIUM CHLORIDE 0.9 % IV SOLN: Freq: Once | INTRAVENOUS | Status: AC

## 2022-09-30 MED ORDER — SODIUM CHLORIDE 0.9% FLUSH
3.0000 mL | Freq: Once | INTRAVENOUS | Status: AC | PRN
  Administered 2022-09-30: 10 mL

## 2022-09-30 MED ORDER — ALTEPLASE 2 MG IJ SOLR
2.0000 mg | Freq: Once | INTRAMUSCULAR | Status: AC | PRN
  Administered 2022-09-30: 2 mg
  Filled 2022-09-30: qty 2

## 2022-09-30 MED ORDER — HEPARIN SOD (PORK) LOCK FLUSH 100 UNIT/ML IV SOLN
500.0000 [IU] | Freq: Once | INTRAVENOUS | Status: AC | PRN
  Administered 2022-09-30: 500 [IU]

## 2022-09-30 MED ORDER — SODIUM CHLORIDE 0.9% FLUSH
10.0000 mL | Freq: Once | INTRAVENOUS | Status: AC | PRN
  Administered 2022-09-30: 10 mL

## 2022-09-30 MED ORDER — GOSERELIN ACETATE 3.6 MG ~~LOC~~ IMPL
3.6000 mg | DRUG_IMPLANT | Freq: Once | SUBCUTANEOUS | Status: DC
  Administered 2022-09-30: 3.6 mg via SUBCUTANEOUS

## 2022-09-30 NOTE — Patient Instructions (Signed)
Zoledronic Acid Injection (Cancer) What is this medication? ZOLEDRONIC ACID (ZOE le dron ik AS id) treats high calcium levels in the blood caused by cancer. It may also be used with chemotherapy to treat weakened bones caused by cancer. It works by slowing down the release of calcium from bones. This lowers calcium levels in your blood. It also makes your bones stronger and less likely to break (fracture). It belongs to a group of medications called bisphosphonates. This medicine may be used for other purposes; ask your health care provider or pharmacist if you have questions. COMMON BRAND NAME(S): Zometa, Zometa Powder What should I tell my care team before I take this medication? They need to know if you have any of these conditions: Dehydration Dental disease Kidney disease Liver disease Low levels of calcium in the blood Lung or breathing disease, such as asthma Receiving steroids, such as dexamethasone or prednisone An unusual or allergic reaction to zoledronic acid, other medications, foods, dyes, or preservatives Pregnant or trying to get pregnant Breast-feeding How should I use this medication? This medication is injected into a vein. It is given by your care team in a hospital or clinic setting. Talk to your care team about the use of this medication in children. Special care may be needed. Overdosage: If you think you have taken too much of this medicine contact a poison control center or emergency room at once. NOTE: This medicine is only for you. Do not share this medicine with others. What if I miss a dose? Keep appointments for follow-up doses. It is important not to miss your dose. Call your care team if you are unable to keep an appointment. What may interact with this medication? Certain antibiotics given by injection Diuretics, such as bumetanide, furosemide NSAIDs, medications for pain and inflammation, such as ibuprofen or naproxen Teriparatide Thalidomide This list  may not describe all possible interactions. Give your health care provider a list of all the medicines, herbs, non-prescription drugs, or dietary supplements you use. Also tell them if you smoke, drink alcohol, or use illegal drugs. Some items may interact with your medicine. What should I watch for while using this medication? Visit your care team for regular checks on your progress. It may be some time before you see the benefit from this medication. Some people who take this medication have severe bone, joint, or muscle pain. This medication may also increase your risk for jaw problems or a broken thigh bone. Tell your care team right away if you have severe pain in your jaw, bones, joints, or muscles. Tell you care team if you have any pain that does not go away or that gets worse. Tell your dentist and dental surgeon that you are taking this medication. You should not have major dental surgery while on this medication. See your dentist to have a dental exam and fix any dental problems before starting this medication. Take good care of your teeth while on this medication. Make sure you see your dentist for regular follow-up appointments. You should make sure you get enough calcium and vitamin D while you are taking this medication. Discuss the foods you eat and the vitamins you take with your care team. Check with your care team if you have severe diarrhea, nausea, and vomiting, or if you sweat a lot. The loss of too much body fluid may make it dangerous for you to take this medication. You may need bloodwork while taking this medication. Talk to your care team if   you wish to become pregnant or think you might be pregnant. This medication can cause serious birth defects. What side effects may I notice from receiving this medication? Side effects that you should report to your care team as soon as possible: Allergic reactions--skin rash, itching, hives, swelling of the face, lips, tongue, or  throat Kidney injury--decrease in the amount of urine, swelling of the ankles, hands, or feet Low calcium level--muscle pain or cramps, confusion, tingling, or numbness in the hands or feet Osteonecrosis of the jaw--pain, swelling, or redness in the mouth, numbness of the jaw, poor healing after dental work, unusual discharge from the mouth, visible bones in the mouth Severe bone, joint, or muscle pain Side effects that usually do not require medical attention (report to your care team if they continue or are bothersome): Constipation Fatigue Fever Loss of appetite Nausea Stomach pain This list may not describe all possible side effects. Call your doctor for medical advice about side effects. You may report side effects to FDA at 1-800-FDA-1088. Where should I keep my medication? This medication is given in a hospital or clinic. It will not be stored at home. NOTE: This sheet is a summary. It may not cover all possible information. If you have questions about this medicine, talk to your doctor, pharmacist, or health care provider. Goserelin Implant What is this medication? GOSERELIN (GOE se rel in) treats prostate cancer and breast cancer. It works by decreasing levels of the hormones testosterone and estrogen in the body. This prevents prostate and breast cancer cells from spreading or growing. It may also be used to treat endometriosis. This is a condition where the tissue that lines the uterus grows outside the uterus. It works by decreasing the amount of estrogen your body makes, which reduces heavy bleeding and pain. It can also be used to help thin the lining of the uterus before a surgery used to prevent or reduce heavy periods. This medicine may be used for other purposes; ask your health care provider or pharmacist if you have questions. COMMON BRAND NAME(S): Zoladex, Zoladex 64-Month What should I tell my care team before I take this medication? They need to know if you have any of  these conditions: Bone problems Diabetes Heart disease History of irregular heartbeat or rhythm An unusual or allergic reaction to goserelin, other medications, foods, dyes, or preservatives Pregnant or trying to get pregnant Breastfeeding How should I use this medication? This medication is injected under the skin. It is given by your care team in a hospital or clinic setting. Talk to your care team about the use of this medication in children. Special care may be needed. Overdosage: If you think you have taken too much of this medicine contact a poison control center or emergency room at once. NOTE: This medicine is only for you. Do not share this medicine with others. What if I miss a dose? Keep appointments for follow-up doses. It is important not to miss your dose. Call your care team if you are unable to keep an appointment. What may interact with this medication? Do not take this medication with any of the following: Cisapride Dronedarone Pimozide Thioridazine This medication may also interact with the following: Other medications that cause heart rhythm changes This list may not describe all possible interactions. Give your health care provider a list of all the medicines, herbs, non-prescription drugs, or dietary supplements you use. Also tell them if you smoke, drink alcohol, or use illegal drugs. Some items may  interact with your medicine. What should I watch for while using this medication? Visit your care team for regular checks on your progress. Your symptoms may appear to get worse during the first weeks of this therapy. Tell your care team if your symptoms do not start to get better or if they get worse after this time. Using this medication for a long time may weaken your bones. If you smoke or frequently drink alcohol you may increase your risk of bone loss. A family history of osteoporosis, chronic use of medications for seizures (convulsions), or corticosteroids can also  increase your risk of bone loss. The risk of bone fractures may be increased. Talk to your care team about your bone health. This medication may increase blood sugar. The risk may be higher in patients who already have diabetes. Ask your care team what you can do to lower your risk of diabetes while taking this medication. This medication should stop regular monthly menstruation in women. Tell your care team if you continue to menstruate. Talk to your care team if you wish to become pregnant or think you might be pregnant. This medication can cause serious birth defects if taken during pregnancy or for 12 weeks after stopping treatment. Talk to your care team about reliable forms of contraception. Do not breastfeed while taking this medication. This medication may cause infertility. Talk to your care team if you are concerned about your fertility. What side effects may I notice from receiving this medication? Side effects that you should report to your care team as soon as possible: Allergic reactions--skin rash, itching, hives, swelling of the face, lips, tongue, or throat Change in the amount of urine Heart attack--pain or tightness in the chest, shoulders, arms, or jaw, nausea, shortness of breath, cold or clammy skin, feeling faint or lightheaded Heart rhythm changes--fast or irregular heartbeat, dizziness, feeling faint or lightheaded, chest pain, trouble breathing High blood sugar (hyperglycemia)--increased thirst or amount of urine, unusual weakness or fatigue, blurry vision High calcium level--increased thirst or amount of urine, nausea, vomiting, confusion, unusual weakness or fatigue, bone pain Pain, redness, irritation, or bruising at the injection site Severe back pain, numbness or weakness of the hands, arms, legs, or feet, loss of coordination, loss of bowel or bladder control Stroke--sudden numbness or weakness of the face, arm, or leg, trouble speaking, confusion, trouble walking,  loss of balance or coordination, dizziness, severe headache, change in vision Swelling and pain of the tumor site or lymph nodes Trouble passing urine Side effects that usually do not require medical attention (report to your care team if they continue or are bothersome): Change in sex drive or performance Headache Hot flashes Rapid or extreme change in emotion or mood Sweating Swelling of the ankles, hands, or feet Unusual vaginal discharge, itching, or odor This list may not describe all possible side effects. Call your doctor for medical advice about side effects. You may report side effects to FDA at 1-800-FDA-1088. Where should I keep my medication? This medication is given in a hospital or clinic. It will not be stored at home. NOTE: This sheet is a summary. It may not cover all possible information. If you have questions about this medicine, talk to your doctor, pharmacist, or health care provider.  2023 Elsevier/Gold Standard (2021-10-03 00:00:00)   2023 Elsevier/Gold Standard (2007-07-11 00:00:00)

## 2022-09-30 NOTE — Assessment & Plan Note (Signed)
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% 09/25/2020: Right breast biopsy: DCIS grade 1, ER/PR positive 10/06/2020  breast MRI showed a 11 cm area of malignancy, with a second 1.9 cm mass, and skin thickening concerning for inflammatory breast carcinoma; at least 6 abnormal lymph nodes noted 10/13/2020 CT chest shows clear lungs and no obvious liver involvement but multiple lytic bone lesions in addition to the findings and MRI above 10/21/2020 spine MRI: Shows widely scattered thoracic and lumbar vertebral body metastases 10/24/2020: 2 cycles of dose dense Adriamycin and Cytoxan discontinued because of rising LFTs 12/01/2020: MRI abdomen: No evidence of metastatic disease in the liver 01/31/2021:Left mastectomy: Residual multifocal IDC 2 cm with intermediate grade DCIS, 15/15 lymph nodes positive, margins negative, lymphovascular space invasion present, ER 60%, PR 10%, HER2 negative, Ki-67 15% Right mastectomy: Fibrocystic change no residual cancer or DCIS   Current treatment: Anastrozole with goserelin and Verzenio and Zometa    Anastrozole toxicities: Tolerating it very well   07/01/2022: Stable bone metastases similar 8 mm left axillary lymph node.   Radiology review: Stable disease Continue with the current treatment.  Today she gets Zometa as well. We will plan for CT chest abdomen pelvis in 3 months and follow-up after that with Zometa.  She comes monthly for goserelin injections.

## 2022-09-30 NOTE — Progress Notes (Signed)
Patient Care Team: Leane Call, MD as PCP - General (Family Medicine) [name], RN as Oncology Nurse Navigator Pershing Proud, RN as Oncology Nurse Navigator Harriette Bouillon, MD as Consulting Physician (General Surgery) Dorothy Puffer, MD as Consulting Physician (Radiation Oncology) [name], MD as Consulting Physician (Hematology and Oncology)  DIAGNOSIS:  Encounter Diagnoses  Name Primary?   Metastasis to bone (HCC) Yes   Ductal carcinoma in situ (DCIS) of right breast     SUMMARY OF ONCOLOGIC HISTORY: Oncology History  Ductal carcinoma in situ (DCIS) of right breast  09/29/2020 Initial Diagnosis   Ductal carcinoma in situ (DCIS) of right breast   10/04/2020 Cancer Staging   Staging form: Breast, AJCC 8th Edition - Clinical: Stage 0 (cTis (DCIS), cN0, cM0) - Signed by Lowella Dell, MD on 10/04/2020   11/25/2020 Genetic Testing   Negative genetic testing:  No pathogenic variants detected on the [employer] CancerNext-Expanded + RNAinsight panel. The report date is 11/25/2020.   The CancerNext-Expanded + RNAinsight gene panel offered by [employer] and includes sequencing and rearrangement analysis for the following 77 genes: AIP, ALK, APC, ATM, AXIN2, BAP1, BARD1, BLM, BMPR1A, BRCA1, BRCA2, BRIP1, CDC73, CDH1, CDK4, CDKN1B, CDKN2A, CHEK2, CTNNA1, DICER1, FANCC, FH, FLCN, GALNT12, KIF1B, LZTR1, MAX, MEN1, MET, MLH1, MSH2, MSH3, MSH6, MUTYH, NBN, NF1, NF2, NTHL1, PALB2, PHOX2B, PMS2, POT1, PRKAR1A, PTCH1, PTEN, RAD51C, RAD51D, RB1, RECQL, RET, SDHA, SDHAF2, SDHB, SDHC, SDHD, SMAD4, SMARCA4, SMARCB1, SMARCE1, STK11, SUFU, TMEM127, TP53, TSC1, TSC2, VHL and XRCC2 (sequencing and deletion/duplication); EGFR, EGLN1, HOXB13, KIT, MITF, PDGFRA, POLD1 and POLE (sequencing only); EPCAM and GREM1 (deletion/duplication only). RNA data is routinely analyzed for use in variant interpretation for all genes.   Malignant neoplasm of upper-outer quadrant of left breast in female,  estrogen receptor positive (HCC)  09/29/2020 Initial Diagnosis   Malignant neoplasm of upper-outer quadrant of left breast in female, estrogen receptor positive (HCC)   10/04/2020 Cancer Staging   Staging form: Breast, AJCC 8th Edition - Clinical stage from 10/04/2020: Stage IIA (cT3, cN1(f), cM0, G2, ER+, PR+, HER2-) - Signed by Lowella Dell, MD on 10/04/2020 Stage prefix: Initial diagnosis Method of lymph node assessment: Core biopsy Histologic grading system: 3 grade system   10/25/2020 - 11/10/2020 Chemotherapy   2 cycles of Adriamycin and Cytoxan.  Chemo discontinued because of rising LFTs.       11/25/2020 Genetic Testing   Negative genetic testing:  No pathogenic variants detected on the [employer] CancerNext-Expanded + RNAinsight panel. The report date is 11/25/2020.   The CancerNext-Expanded + RNAinsight gene panel offered by [employer] and includes sequencing and rearrangement analysis for the following 77 genes: AIP, ALK, APC, ATM, AXIN2, BAP1, BARD1, BLM, BMPR1A, BRCA1, BRCA2, BRIP1, CDC73, CDH1, CDK4, CDKN1B, CDKN2A, CHEK2, CTNNA1, DICER1, FANCC, FH, FLCN, GALNT12, KIF1B, LZTR1, MAX, MEN1, MET, MLH1, MSH2, MSH3, MSH6, MUTYH, NBN, NF1, NF2, NTHL1, PALB2, PHOX2B, PMS2, POT1, PRKAR1A, PTCH1, PTEN, RAD51C, RAD51D, RB1, RECQL, RET, SDHA, SDHAF2, SDHB, SDHC, SDHD, SMAD4, SMARCA4, SMARCB1, SMARCE1, STK11, SUFU, TMEM127, TP53, TSC1, TSC2, VHL and XRCC2 (sequencing and deletion/duplication); EGFR, EGLN1, HOXB13, KIT, MITF, PDGFRA, POLD1 and POLE (sequencing only); EPCAM and GREM1 (deletion/duplication only). RNA data is routinely analyzed for use in variant interpretation for all genes.    Anti-estrogen oral therapy   anastrozole started 12/06/2020 goserelin started 10/11/2020  Verzinio started Aug 2022 zolendronate started 11/02/2018. Every 12 weeks.   01/31/2021 Surgery   Left mastectomy: Residual multifocal IDC 2 cm with intermediate grade DCIS,  15/15 lymph nodes positive, margins  negative, lymphovascular space invasion present, ER 60%, PR 10%, HER2 negative, Ki-67 15% Right mastectomy: Fibrocystic change no residual cancer or DCIS     CHIEF COMPLIANT: Follow-up of metastatic breast cancer on Verzinio Faslodex and Zometa  INTERVAL HISTORY: Gabriela Sullivan is a 32 year old with above-mentioned metastatic breast cancer was currently on Verzinio and tolerating it extremely well.  She denies any diarrhea.  She does not have any pain anywhere.  She is also tolerating her injections.   ALLERGIES:  has No Known Allergies.  MEDICATIONS:  Current Outpatient Medications  Medication Sig Dispense Refill   abemaciclib (VERZENIO) 100 MG tablet Take 1 tablet (100 mg total) by mouth 2 (two) times daily. Swallow tablets whole. Do not chew, crush, or split tablets before swallowing. 56 tablet 3   anastrozole (ARIMIDEX) 1 MG tablet Take 1 tablet (1 mg total) by mouth daily. 90 tablet 3   calcium carbonate (TUMS - DOSED IN MG ELEMENTAL CALCIUM) 500 MG chewable tablet Chew 2 tablets by mouth every other day.     cholecalciferol (VITAMIN D3) 25 MCG (1000 UNIT) tablet Take 1,000 Units by mouth daily.     Goserelin Acetate (ZOLADEX [place]) Inject 1 Dose into the skin every 30 (thirty) days. 10/24/21 was her last dose     omeprazole (PRILOSEC) 20 MG capsule Take 1 capsule (20 mg total) by mouth daily. 30 capsule 0   ondansetron (ZOFRAN-ODT) 4 MG disintegrating tablet Take 1 tablet (4 mg total) by mouth every 8 (eight) hours as needed for nausea or vomiting. 8 tablet 0   potassium chloride SA (KLOR-CON M) 20 MEQ tablet Take 1 tablet (20 mEq total) by mouth 2 (two) times daily. 30 tablet 2   Semaglutide (OZEMPIC, 0.25 OR 0.5 MG/DOSE, [place]) Inject 0.25 mg into the skin once a week. mondays     Vitamin D, Ergocalciferol, (DRISDOL) 1.25 MG (50000 UNIT) CAPS capsule Take 50,000 Units by mouth once a week.     No current facility-administered medications for this visit.    PHYSICAL EXAMINATION: ECOG  PERFORMANCE STATUS: 1 - Symptomatic but completely ambulatory  Vitals:   09/30/22 1608  BP: (!) 145/81  Pulse: 88  Resp: 18  Temp: (!) 97.3 F (36.3 C)  SpO2: 100%   Filed Weights   09/30/22 1608  Weight: 195 lb (88.5 kg)      LABORATORY DATA:  I have reviewed the data as listed    Latest Ref Rng & Units 07/02/2022   10:03 AM 07/01/2022    2:32 PM 04/09/2022    1:14 PM  CMP  Glucose 70 - 99 mg/dL 161   096   BUN 6 - 20 mg/dL 17   19   Creatinine 0.45 - 1.00 mg/dL 4.09  8.11  9.14   Sodium 135 - 145 mmol/L 140   142   Potassium 3.5 - 5.1 mmol/L 3.9   3.8   Chloride 98 - 111 mmol/L 107   106   CO2 22 - 32 mmol/L 29   30   Calcium 8.9 - 10.3 mg/dL 9.4   9.5   Total Protein 6.5 - 8.1 g/dL 7.1   7.1   Total Bilirubin 0.3 - 1.2 mg/dL 0.6   0.8   Alkaline Phos 38 - 126 U/L 54   42   AST 15 - 41 U/L 55   59   ALT 0 - 44 U/L 95   102     Lab Results  Component  Value Date   WBC 3.8 (L) 09/30/2022   HGB 13.0 09/30/2022   HCT 35.3 (L) 09/30/2022   MCV 92.9 09/30/2022   PLT 221 09/30/2022   NEUTROABS 2.2 09/30/2022    ASSESSMENT & PLAN:  Ductal carcinoma in situ (DCIS) of right breast 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% 09/25/2020: Right breast biopsy: DCIS grade 1, ER/PR positive 10/06/2020  breast MRI showed a 11 cm area of malignancy, with a second 1.9 cm mass, and skin thickening concerning for inflammatory breast carcinoma; at least 6 abnormal lymph nodes noted 10/13/2020 CT chest shows clear lungs and no obvious liver involvement but multiple lytic bone lesions in addition to the findings and MRI above 10/21/2020 spine MRI: Shows widely scattered thoracic and lumbar vertebral body metastases 10/24/2020: 2 cycles of dose dense Adriamycin and Cytoxan discontinued because of rising LFTs 12/01/2020: MRI abdomen: No evidence of metastatic disease in the liver 01/31/2021:Left mastectomy: Residual  multifocal IDC 2 cm with intermediate grade DCIS, 15/15 lymph nodes positive, margins negative, lymphovascular space invasion present, ER 60%, PR 10%, HER2 negative, Ki-67 15% Right mastectomy: Fibrocystic change no residual cancer or DCIS   Current treatment: Anastrozole with goserelin and Verzenio and Zometa    Anastrozole toxicities: Tolerating it very well   07/01/2022: Stable bone metastases similar 8 mm left axillary lymph node.   Radiology review: Stable disease Continue with the current treatment.  Today she gets Zometa as well. We will plan for CT chest abdomen pelvis in 3 months and follow-up after that with Zometa.  She comes monthly for goserelin injections.    Orders Placed This Encounter  Procedures   CT CHEST ABDOMEN PELVIS W CONTRAST    Standing Status:   Future    Standing Expiration Date:   09/30/2023    Order Specific Question:   If indicated for the ordered procedure, I authorize the administration of contrast media per Radiology protocol    Answer:   Yes    Order Specific Question:   Does the patient have a contrast media/X-ray dye allergy?    Answer:   No    Order Specific Question:   Is patient pregnant?    Answer:   No    Order Specific Question:   Preferred imaging location?    Answer:   [facility]    Order Specific Question:   Release to patient    Answer:   Immediate    Order Specific Question:   If indicated for the ordered procedure, I authorize the administration of oral contrast media per Radiology protocol    Answer:   Yes   The patient has a good understanding of the overall plan. she agrees with it. she will call with any problems that may develop before the next visit here. Total time spent: 30 mins including face to face time and time spent for planning, charting and co-ordination of care   Tamsen Meek, MD 09/30/22

## 2022-10-02 ENCOUNTER — Telehealth: Payer: Self-pay | Admitting: Hematology and Oncology

## 2022-10-02 NOTE — Telephone Encounter (Signed)
Reached out to patient to schedule per LOS; no answer, mailing appointment reminders

## 2022-10-08 ENCOUNTER — Other Ambulatory Visit: Payer: Self-pay | Admitting: Hematology and Oncology

## 2022-10-08 ENCOUNTER — Other Ambulatory Visit (HOSPITAL_COMMUNITY): Payer: Self-pay

## 2022-10-08 ENCOUNTER — Other Ambulatory Visit: Payer: Self-pay

## 2022-10-08 MED ORDER — ABEMACICLIB 100 MG PO TABS
100.0000 mg | ORAL_TABLET | Freq: Two times a day (BID) | ORAL | 3 refills | Status: DC
  Filled 2022-10-08: qty 56, 28d supply, fill #0
  Filled 2022-10-30: qty 56, 28d supply, fill #1
  Filled 2022-11-28: qty 56, 28d supply, fill #2
  Filled 2022-12-25: qty 56, 28d supply, fill #3

## 2022-10-11 ENCOUNTER — Other Ambulatory Visit: Payer: Self-pay

## 2022-10-14 ENCOUNTER — Ambulatory Visit: Payer: Medicaid Other | Attending: Surgery

## 2022-10-14 VITALS — Wt 192.1 lb

## 2022-10-14 DIAGNOSIS — Z483 Aftercare following surgery for neoplasm: Secondary | ICD-10-CM | POA: Insufficient documentation

## 2022-10-14 NOTE — Therapy (Signed)
OUTPATIENT PHYSICAL THERAPY SOZO SCREENING NOTE   Patient Name: Gabriela Sullivan MRN: [id number] DOB:1991-02-08, 32 y.o., female Today's Date: 10/14/2022  PCP: Leane Call, MD REFERRING PROVIDER: Harriette Bouillon, MD   PT End of Session - 10/14/22 4098     Visit Number 8   # unchanged due to screen only   PT Start Time 0827    PT Stop Time 0830    PT Time Calculation (min) 3 min    Activity Tolerance Patient tolerated treatment well    Behavior During Therapy [facility] for tasks assessed/performed             Past Medical History:  Diagnosis Date   Asthma    remote   Breast cancer (HCC)    Elevated liver enzymes    Family history of breast cancer    Family history of ovarian cancer    Family history of prostate cancer    Family history of stomach cancer    Hepatic steatosis    Hyperglycemia    A1c 7.6% 12/19/20   Migraines    PCOS (polycystic ovarian syndrome)    Pre-diabetes    Past Surgical History:  Procedure Laterality Date   MASTECTOMY W/ SENTINEL NODE BIOPSY Right 01/31/2021   Procedure: RIGHT SIMPLE MASTECTOMY;  Surgeon: Harriette Bouillon, MD;  Location: [facility] OR;  Service: General;  Laterality: Right;   MODIFIED MASTECTOMY Left 01/31/2021   Procedure: LEFT MODIFIED RADICAL MASTECTOMY;  Surgeon: Harriette Bouillon, MD;  Location: [facility] OR;  Service: General;  Laterality: Left;   NO PAST SURGERIES     PORTACATH PLACEMENT Right 10/18/2020   Procedure: INSERTION PORT-A-CATH;  Surgeon: Harriette Bouillon, MD;  Location: [facility];  Service: General;  Laterality: Right;   Patient Active Problem List   Diagnosis Date Noted   Left breast cancer with T3 tumor, >5 cm in greatest dimension (HCC) 01/31/2021   Bone metastases 12/28/2020   Port-A-Cath in place 12/06/2020   Genetic testing 11/25/2020   Goals of care, counseling/discussion 10/25/2020   Lytic bone lesions on xray 10/16/2020   Family history of ovarian cancer    Family history of prostate cancer    Family  history of breast cancer    Family history of stomach cancer    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 It is ER/PR positive and HER2 negative with a Ki67 of 15%. She underwent neoadjuvant chemotherapy beginning 10/24/2020 for 2 rounds which was stopped due to elevated LFTs. She had a bilateral mastectomy with left axillary node dissection on 01/31/2021 with 15/15 nodes positive. She started Anastrozole on 12/06/2020. 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   PRECAUTIONS: left UE Lymphedema risk, None  SUBJECTIVE: Pt returns for her 3 month L-Dex screen.   PAIN:  Are you having pain? No  SOZO SCREENING: Patient was assessed today using the SOZO machine to determine the lymphedema index score. This was compared to her baseline score. It was determined that she is within the recommended range when compared to her baseline and no further action is needed at this time. She  will continue SOZO screenings. These are done every 3 months for 2 years post operatively followed by every 6 months for 2 years, and then annually.  Plan: Cont every 3 month L-Dex screens for up to 2 years from her Lt ALND (~02/01/2023)   L-DEX FLOWSHEETS - 10/14/22 0800       L-DEX LYMPHEDEMA SCREENING   Measurement Type Unilateral    L-DEX MEASUREMENT EXTREMITY Upper Extremity    POSITION  Standing    DOMINANT SIDE Right    At Risk Side Left    BASELINE SCORE (UNILATERAL) 2.9    L-DEX SCORE (UNILATERAL) 5.3    VALUE CHANGE (UNILAT) 2.4              Hermenia Bers, PTA 10/14/2022, 8:30 AM

## 2022-10-30 ENCOUNTER — Other Ambulatory Visit (HOSPITAL_COMMUNITY): Payer: Self-pay

## 2022-10-30 ENCOUNTER — Inpatient Hospital Stay: Payer: Medicaid Other | Attending: Oncology

## 2022-10-30 VITALS — BP 134/89 | HR 80 | Temp 98.0°F | Resp 16 | Wt 190.5 lb

## 2022-10-30 DIAGNOSIS — C7951 Secondary malignant neoplasm of bone: Secondary | ICD-10-CM | POA: Diagnosis not present

## 2022-10-30 DIAGNOSIS — Z170 Estrogen receptor positive status [ER+]: Secondary | ICD-10-CM | POA: Diagnosis not present

## 2022-10-30 DIAGNOSIS — Z95828 Presence of other vascular implants and grafts: Secondary | ICD-10-CM

## 2022-10-30 DIAGNOSIS — Z5111 Encounter for antineoplastic chemotherapy: Secondary | ICD-10-CM | POA: Diagnosis not present

## 2022-10-30 DIAGNOSIS — C50412 Malignant neoplasm of upper-outer quadrant of left female breast: Secondary | ICD-10-CM | POA: Diagnosis present

## 2022-10-30 MED ORDER — GOSERELIN ACETATE 3.6 MG ~~LOC~~ IMPL
3.6000 mg | DRUG_IMPLANT | Freq: Once | SUBCUTANEOUS | Status: AC
  Administered 2022-10-30: 3.6 mg via SUBCUTANEOUS
  Filled 2022-10-30: qty 3.6

## 2022-11-05 ENCOUNTER — Other Ambulatory Visit (HOSPITAL_COMMUNITY): Payer: Self-pay

## 2022-11-28 ENCOUNTER — Other Ambulatory Visit: Payer: Self-pay

## 2022-11-29 ENCOUNTER — Other Ambulatory Visit (HOSPITAL_COMMUNITY): Payer: Self-pay

## 2022-11-29 ENCOUNTER — Inpatient Hospital Stay: Payer: Medicaid Other | Attending: Oncology

## 2022-11-29 ENCOUNTER — Other Ambulatory Visit: Payer: Self-pay

## 2022-11-29 VITALS — BP 142/75 | HR 84 | Temp 98.7°F | Resp 16

## 2022-11-29 DIAGNOSIS — C50412 Malignant neoplasm of upper-outer quadrant of left female breast: Secondary | ICD-10-CM | POA: Insufficient documentation

## 2022-11-29 DIAGNOSIS — Z5111 Encounter for antineoplastic chemotherapy: Secondary | ICD-10-CM | POA: Diagnosis present

## 2022-11-29 DIAGNOSIS — C7951 Secondary malignant neoplasm of bone: Secondary | ICD-10-CM | POA: Insufficient documentation

## 2022-11-29 DIAGNOSIS — Z95828 Presence of other vascular implants and grafts: Secondary | ICD-10-CM

## 2022-11-29 DIAGNOSIS — Z79811 Long term (current) use of aromatase inhibitors: Secondary | ICD-10-CM | POA: Diagnosis not present

## 2022-11-29 DIAGNOSIS — Z170 Estrogen receptor positive status [ER+]: Secondary | ICD-10-CM | POA: Diagnosis not present

## 2022-11-29 MED ORDER — GOSERELIN ACETATE 3.6 MG ~~LOC~~ IMPL
3.6000 mg | DRUG_IMPLANT | Freq: Once | SUBCUTANEOUS | Status: AC
  Administered 2022-11-29: 3.6 mg via SUBCUTANEOUS
  Filled 2022-11-29: qty 3.6

## 2022-12-02 ENCOUNTER — Other Ambulatory Visit (HOSPITAL_COMMUNITY): Payer: Self-pay

## 2022-12-25 ENCOUNTER — Other Ambulatory Visit (HOSPITAL_COMMUNITY): Payer: Self-pay

## 2022-12-26 ENCOUNTER — Telehealth: Payer: Self-pay | Admitting: Hematology and Oncology

## 2022-12-26 NOTE — Telephone Encounter (Signed)
Rescheduled appointment per provider [facility]. Patient is aware of the changes made to her upcoming appointment. 

## 2022-12-30 ENCOUNTER — Inpatient Hospital Stay: Payer: Medicaid Other

## 2022-12-30 ENCOUNTER — Other Ambulatory Visit (HOSPITAL_COMMUNITY): Payer: Self-pay

## 2022-12-30 ENCOUNTER — Other Ambulatory Visit: Payer: Self-pay

## 2022-12-30 DIAGNOSIS — C50412 Malignant neoplasm of upper-outer quadrant of left female breast: Secondary | ICD-10-CM

## 2022-12-31 ENCOUNTER — Inpatient Hospital Stay: Payer: Medicaid Other | Attending: Oncology

## 2022-12-31 ENCOUNTER — Other Ambulatory Visit: Payer: Self-pay

## 2022-12-31 ENCOUNTER — Encounter (HOSPITAL_COMMUNITY): Payer: Self-pay

## 2022-12-31 ENCOUNTER — Ambulatory Visit (HOSPITAL_COMMUNITY)
Admission: RE | Admit: 2022-12-31 | Discharge: 2022-12-31 | Disposition: A | Discharge status: Home / Self Care | Payer: Medicaid Other | Source: Ambulatory Visit | Attending: Hematology and Oncology | Admitting: Hematology and Oncology

## 2022-12-31 DIAGNOSIS — C50412 Malignant neoplasm of upper-outer quadrant of left female breast: Secondary | ICD-10-CM | POA: Insufficient documentation

## 2022-12-31 DIAGNOSIS — Z800 Family history of malignant neoplasm of digestive organs: Secondary | ICD-10-CM | POA: Diagnosis not present

## 2022-12-31 DIAGNOSIS — M549 Dorsalgia, unspecified: Secondary | ICD-10-CM | POA: Insufficient documentation

## 2022-12-31 DIAGNOSIS — C7951 Secondary malignant neoplasm of bone: Secondary | ICD-10-CM | POA: Insufficient documentation

## 2022-12-31 DIAGNOSIS — Z9013 Acquired absence of bilateral breasts and nipples: Secondary | ICD-10-CM | POA: Diagnosis not present

## 2022-12-31 DIAGNOSIS — Z8042 Family history of malignant neoplasm of prostate: Secondary | ICD-10-CM | POA: Diagnosis not present

## 2022-12-31 DIAGNOSIS — Z170 Estrogen receptor positive status [ER+]: Secondary | ICD-10-CM | POA: Insufficient documentation

## 2022-12-31 DIAGNOSIS — Z8041 Family history of malignant neoplasm of ovary: Secondary | ICD-10-CM | POA: Diagnosis not present

## 2022-12-31 DIAGNOSIS — Z5111 Encounter for antineoplastic chemotherapy: Secondary | ICD-10-CM | POA: Insufficient documentation

## 2022-12-31 DIAGNOSIS — G893 Neoplasm related pain (acute) (chronic): Secondary | ICD-10-CM | POA: Insufficient documentation

## 2022-12-31 DIAGNOSIS — Z803 Family history of malignant neoplasm of breast: Secondary | ICD-10-CM | POA: Diagnosis not present

## 2022-12-31 LAB — CMP (CANCER CENTER ONLY)
ALT: 39 U/L (ref 0–44)
AST: 39 U/L (ref 15–41)
Albumin: 4.4 g/dL (ref 3.5–5.0)
Alkaline Phosphatase: 44 U/L (ref 38–126)
Anion gap: 6 (ref 5–15)
BUN: 12 mg/dL (ref 6–20)
CO2: 27 mmol/L (ref 22–32)
Calcium: 9.4 mg/dL (ref 8.9–10.3)
Chloride: 108 mmol/L (ref 98–111)
Creatinine: 0.74 mg/dL (ref 0.44–1.00)
GFR, Estimated: >60 mL/min (ref >60)
Glucose, Bld: 92 mg/dL (ref 70–99)
Potassium: 3.3 mmol/L — ABNORMAL LOW (ref 3.5–5.1)
Sodium: 141 mmol/L (ref 135–145)
Total Bilirubin: 1.2 mg/dL (ref 0.3–1.2)
Total Protein: 7.5 g/dL (ref 6.5–8.1)

## 2022-12-31 LAB — CBC WITH DIFFERENTIAL (CANCER CENTER ONLY)
Abs Immature Granulocytes: 0 10*3/uL (ref 0.00–0.07)
Basophils Absolute: 0.1 10*3/uL (ref 0.0–0.1)
Basophils Relative: 3 %
Eosinophils Absolute: 0.1 10*3/uL (ref 0.0–0.5)
Eosinophils Relative: 3 %
HCT: 35.3 % — ABNORMAL LOW (ref 36.0–46.0)
Hemoglobin: 12.9 g/dL (ref 12.0–15.0)
Immature Granulocytes: 0 %
Lymphocytes Relative: 27 %
Lymphs Abs: 0.7 10*3/uL (ref 0.7–4.0)
MCH: 34.3 pg — ABNORMAL HIGH (ref 26.0–34.0)
MCHC: 36.5 g/dL — ABNORMAL HIGH (ref 30.0–36.0)
MCV: 93.9 fL (ref 80.0–100.0)
Monocytes Absolute: 0.2 10*3/uL (ref 0.1–1.0)
Monocytes Relative: 8 %
Neutro Abs: 1.4 10*3/uL — ABNORMAL LOW (ref 1.7–7.7)
Neutrophils Relative %: 59 %
Platelet Count: 189 10*3/uL (ref 150–400)
RBC: 3.76 MIL/uL — ABNORMAL LOW (ref 3.87–5.11)
RDW: 13 % (ref 11.5–15.5)
WBC Count: 2.4 10*3/uL — ABNORMAL LOW (ref 4.0–10.5)
nRBC: 0 % (ref 0.0–0.2)

## 2022-12-31 MED ORDER — HEPARIN SOD (PORK) LOCK FLUSH 100 UNIT/ML IV SOLN
INTRAVENOUS | Status: AC
  Filled 2022-12-31: qty 5

## 2022-12-31 MED ORDER — IOHEXOL 300 MG/ML  SOLN
100.0000 mL | Freq: Once | INTRAMUSCULAR | Status: AC | PRN
  Administered 2022-12-31: 100 mL via INTRAVENOUS

## 2022-12-31 MED ORDER — SODIUM CHLORIDE (PF) 0.9 % IJ SOLN
INTRAMUSCULAR | Status: AC
  Filled 2022-12-31: qty 50

## 2022-12-31 MED ORDER — HEPARIN SOD (PORK) LOCK FLUSH 100 UNIT/ML IV SOLN
500.0000 [IU] | Freq: Once | INTRAVENOUS | Status: AC
  Administered 2022-12-31: 500 [IU] via INTRAVENOUS

## 2023-01-01 ENCOUNTER — Telehealth: Payer: Self-pay

## 2023-01-01 ENCOUNTER — Inpatient Hospital Stay: Payer: Medicaid Other | Admitting: Hematology and Oncology

## 2023-01-01 ENCOUNTER — Inpatient Hospital Stay: Payer: Medicaid Other

## 2023-01-01 ENCOUNTER — Other Ambulatory Visit: Payer: Self-pay

## 2023-01-01 ENCOUNTER — Inpatient Hospital Stay (HOSPITAL_BASED_OUTPATIENT_CLINIC_OR_DEPARTMENT_OTHER): Payer: Medicaid Other | Admitting: Physician Assistant

## 2023-01-01 VITALS — BP 133/71 | HR 86 | Temp 99.2°F | Resp 18

## 2023-01-01 VITALS — BP 131/71 | HR 86 | Temp 99.2°F | Resp 18

## 2023-01-01 DIAGNOSIS — C7951 Secondary malignant neoplasm of bone: Secondary | ICD-10-CM

## 2023-01-01 DIAGNOSIS — Z5111 Encounter for antineoplastic chemotherapy: Secondary | ICD-10-CM | POA: Diagnosis not present

## 2023-01-01 DIAGNOSIS — C50412 Malignant neoplasm of upper-outer quadrant of left female breast: Secondary | ICD-10-CM

## 2023-01-01 DIAGNOSIS — G893 Neoplasm related pain (acute) (chronic): Secondary | ICD-10-CM | POA: Diagnosis not present

## 2023-01-01 DIAGNOSIS — Z170 Estrogen receptor positive status [ER+]: Secondary | ICD-10-CM | POA: Diagnosis not present

## 2023-01-01 DIAGNOSIS — Z95828 Presence of other vascular implants and grafts: Secondary | ICD-10-CM

## 2023-01-01 MED ORDER — TRAMADOL HCL 50 MG PO TABS: 50.0000 mg | ORAL_TABLET | Freq: Four times a day (QID) | ORAL | 0 refills | Status: DC | PRN

## 2023-01-01 MED ORDER — GOSERELIN ACETATE 3.6 MG ~~LOC~~ IMPL
3.6000 mg | DRUG_IMPLANT | Freq: Once | SUBCUTANEOUS | Status: AC
  Administered 2023-01-01: 3.6 mg via SUBCUTANEOUS
  Filled 2023-01-01: qty 3.6

## 2023-01-01 MED ORDER — SODIUM CHLORIDE 0.9% FLUSH
10.0000 mL | Freq: Once | INTRAVENOUS | Status: AC | PRN
  Administered 2023-01-01: 10 mL

## 2023-01-01 MED ORDER — ZOLEDRONIC ACID 4 MG/100ML IV SOLN
4.0000 mg | Freq: Once | INTRAVENOUS | Status: AC
  Administered 2023-01-01: 4 mg via INTRAVENOUS
  Filled 2023-01-01: qty 100

## 2023-01-01 MED ORDER — SODIUM CHLORIDE 0.9 % IV SOLN: Freq: Once | INTRAVENOUS | Status: AC

## 2023-01-01 MED ORDER — HEPARIN SOD (PORK) LOCK FLUSH 100 UNIT/ML IV SOLN
500.0000 [IU] | Freq: Once | INTRAVENOUS | Status: AC | PRN
  Administered 2023-01-01: 500 [IU]

## 2023-01-01 NOTE — Progress Notes (Signed)
Symptom Management Consult Note [facility]    Patient Care Team: Leane Call, MD as PCP - General (Family Medicine) [name], RN as Oncology Nurse Navigator Pershing Proud, RN as Oncology Nurse Navigator Harriette Bouillon, MD as Consulting Physician (General Surgery) Dorothy Puffer, MD as Consulting Physician (Radiation Oncology) [name], MD as Consulting Physician (Hematology and Oncology)    Name / MRN / DOB: Gabriela Sullivan  [id number]  17-Jan-1991   Date of visit: 01/01/2023   Chief Complaint/Reason for visit: pain   Current Therapy: Zoladex and zometa  Last treatment:  Treatment 7 on 01/01/23   ASSESSMENT & PLAN: Patient is a 32 y.o. 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 Pamelia Hoit.  I have viewed most recent oncology note and lab work.    #Ductal carcinoma in situ (DCIS) of right breast - Next appointment with oncologist is 01/06/23   #Back pain -Patient with longstanding history of back pain, acutely worsened in the last 2 weeks.  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 -Patient had CT CAP for metastatic breast cancer follow-up yesterday that shows no significant interval change in osseous metastasis.  No evidence of new or progressive metastatic disease in chest, abdomen or pelvis. -Suspect pain is related to osseous mets based on physical exam. No red flags for back pain based on history and exam. -Had lengthy discussion with patient regarding options for pain management.  With described as burning sensation will try gabapentin versus tramadol.  Patient prefers to stay away from oxycodone or Vicodin as they have made her sick in the past.  She prefers not to take a daily medication therefore tramadol would be the best option for as needed use.  10-day prescription sent to her pharmacy for tramadol.  She will follow-up with oncologist to discuss medication effectiveness.  Also discussed trying  stretching and yoga for nonpharmacological interventions as well. I have reviewed the PDMP during this encounter. Discussed how to safely take narcotics.   Strict ED precautions discussed should symptoms worsen.   Heme/Onc History: Oncology History  Ductal carcinoma in situ (DCIS) of right breast  09/29/2020 Initial Diagnosis   Ductal carcinoma in situ (DCIS) of right breast   10/04/2020 Cancer Staging   Staging form: Breast, AJCC 8th Edition - Clinical: Stage 0 (cTis (DCIS), cN0, cM0) - Signed by Lowella Dell, MD on 10/04/2020   11/25/2020 Genetic Testing   Negative genetic testing:  No pathogenic variants detected on the [employer] CancerNext-Expanded + RNAinsight panel. The report date is 11/25/2020.   The CancerNext-Expanded + RNAinsight gene panel offered by [employer] and includes sequencing and rearrangement analysis for the following 77 genes: AIP, ALK, APC, ATM, AXIN2, BAP1, BARD1, BLM, BMPR1A, BRCA1, BRCA2, BRIP1, CDC73, CDH1, CDK4, CDKN1B, CDKN2A, CHEK2, CTNNA1, DICER1, FANCC, FH, FLCN, GALNT12, KIF1B, LZTR1, MAX, MEN1, MET, MLH1, MSH2, MSH3, MSH6, MUTYH, NBN, NF1, NF2, NTHL1, PALB2, PHOX2B, PMS2, POT1, PRKAR1A, PTCH1, PTEN, RAD51C, RAD51D, RB1, RECQL, RET, SDHA, SDHAF2, SDHB, SDHC, SDHD, SMAD4, SMARCA4, SMARCB1, SMARCE1, STK11, SUFU, TMEM127, TP53, TSC1, TSC2, VHL and XRCC2 (sequencing and deletion/duplication); EGFR, EGLN1, HOXB13, KIT, MITF, PDGFRA, POLD1 and POLE (sequencing only); EPCAM and GREM1 (deletion/duplication only). RNA data is routinely analyzed for use in variant interpretation for all genes.   Malignant neoplasm of upper-outer quadrant of left breast in female, estrogen receptor positive (HCC)  09/29/2020 Initial Diagnosis   Malignant neoplasm of upper-outer quadrant of left breast  in female, estrogen receptor positive (HCC)   10/04/2020 Cancer Staging   Staging form: Breast, AJCC 8th Edition - Clinical stage from 10/04/2020: Stage IIA (cT3, cN1(f), cM0, G2, ER+, PR+,  HER2-) - Signed by Lowella Dell, MD on 10/04/2020 Stage prefix: Initial diagnosis Method of lymph node assessment: Core biopsy Histologic grading system: 3 grade system   10/25/2020 - 11/10/2020 Chemotherapy   2 cycles of Adriamycin and Cytoxan.  Chemo discontinued because of rising LFTs.       11/25/2020 Genetic Testing   Negative genetic testing:  No pathogenic variants detected on the [employer] CancerNext-Expanded + RNAinsight panel. The report date is 11/25/2020.   The CancerNext-Expanded + RNAinsight gene panel offered by [employer] and includes sequencing and rearrangement analysis for the following 77 genes: AIP, ALK, APC, ATM, AXIN2, BAP1, BARD1, BLM, BMPR1A, BRCA1, BRCA2, BRIP1, CDC73, CDH1, CDK4, CDKN1B, CDKN2A, CHEK2, CTNNA1, DICER1, FANCC, FH, FLCN, GALNT12, KIF1B, LZTR1, MAX, MEN1, MET, MLH1, MSH2, MSH3, MSH6, MUTYH, NBN, NF1, NF2, NTHL1, PALB2, PHOX2B, PMS2, POT1, PRKAR1A, PTCH1, PTEN, RAD51C, RAD51D, RB1, RECQL, RET, SDHA, SDHAF2, SDHB, SDHC, SDHD, SMAD4, SMARCA4, SMARCB1, SMARCE1, STK11, SUFU, TMEM127, TP53, TSC1, TSC2, VHL and XRCC2 (sequencing and deletion/duplication); EGFR, EGLN1, HOXB13, KIT, MITF, PDGFRA, POLD1 and POLE (sequencing only); EPCAM and GREM1 (deletion/duplication only). RNA data is routinely analyzed for use in variant interpretation for all genes.    Anti-estrogen oral therapy   anastrozole started 12/06/2020 goserelin started 10/11/2020  Verzinio started Aug 2022 zolendronate started 11/02/2018. Every 12 weeks.   01/31/2021 Surgery   Left mastectomy: Residual multifocal IDC 2 cm with intermediate grade DCIS, 15/15 lymph nodes positive, margins negative, lymphovascular space invasion present, ER 60%, PR 10%, HER2 negative, Ki-67 15% Right mastectomy: Fibrocystic change no residual cancer or DCIS       Interval history-: Shanieka Sabados is a 32 y.o. female with oncologic history as above presenting to [facility], [place] today with chief complaint of back pain.  She  presents unaccompanied to clinic today.  Patient was in infusion center today for treatment.  She reported to RN that she was having some back pain.  RN reached out for symptom management clinic appointment.  Patient tells me she has had a long history of back pain which precedes her cancer diagnosis in 2022.  She states she typically just deals with the pain.  In the last 2 weeks however the pain has become more bothersome.  Pain is intermittent.  It is located in her lower back and radiates up to her neck occasionally.  She describes the pain as burning and aching.  She denies pain being worse with movement.  She describes the sensation of feeling like she needs to crack her back when pain is present. She denies any recent injury or trauma.  Pain right now is 5 out of 10 in severity however when severe gets as high as 8 out of 10. She does think on days she does gentle stretching the pain is not as bad. Patient states she did has tried taking Tylenol and ibuprofen in the past without symptom improvement.  She also reports not tolerating Vicodin and oxycodone in the past as it made her vomit despite taking with food. She denies any bowel or bladder incontinence or retention.  Denies any fever, night sweats, urinary symptoms, numbness, tingling, weakness.   ROS  All other systems are reviewed and are negative for acute change except as noted in the HPI.    No Known Allergies   Past  Medical History:  Diagnosis Date   Asthma    remote   Breast cancer (HCC)    Elevated liver enzymes    Family history of breast cancer    Family history of ovarian cancer    Family history of prostate cancer    Family history of stomach cancer    Hepatic steatosis    Hyperglycemia    A1c 7.6% 12/19/20   Migraines    PCOS (polycystic ovarian syndrome)    Pre-diabetes      Past Surgical History:  Procedure Laterality Date   MASTECTOMY W/ SENTINEL NODE BIOPSY Right 01/31/2021   Procedure: RIGHT SIMPLE  MASTECTOMY;  Surgeon: Harriette Bouillon, MD;  Location: [facility] OR;  Service: General;  Laterality: Right;   MODIFIED MASTECTOMY Left 01/31/2021   Procedure: LEFT MODIFIED RADICAL MASTECTOMY;  Surgeon: Harriette Bouillon, MD;  Location: [facility] OR;  Service: General;  Laterality: Left;   NO PAST SURGERIES     PORTACATH PLACEMENT Right 10/18/2020   Procedure: INSERTION PORT-A-CATH;  Surgeon: Harriette Bouillon, MD;  Location: [facility];  Service: General;  Laterality: Right;    Social History   Socioeconomic History   Marital status: Single    Spouse name: Not on file   Number of children: 0   Years of education: Not on file   Highest education level: Not on file  Occupational History   Occupation: Scientist, forensic  Tobacco Use   Smoking status: Never   Smokeless tobacco: Never  Vaping Use   Vaping status: Never Used  Substance and Sexual Activity   Alcohol use: Never   Drug use: Never   Sexual activity: Not on file  Other Topics Concern   Not on file  Social History Narrative   Not on file   Social Determinants of Health   Financial Resource Strain: Not on file  Food Insecurity: Not on file  Transportation Needs: No Transportation Needs (10/04/2020)   PRAPARE - Administrator, Civil Service (Medical): No    Lack of Transportation (Non-Medical): No  Physical Activity: Not on file  Stress: Not on file  Social Connections: Not on file  Intimate Partner Violence: Not on file    Family History  Problem Relation Age of Onset   Ovarian cancer Maternal Grandmother        unknown age of diagnosis   Prostate cancer Maternal Grandfather        metastatic   Breast cancer Cousin        unknown age of diagnosis, maternal first cousin   Stomach cancer Cousin        dx 20s/30s, maternal first cousin     Current Outpatient Medications:    traMADol (ULTRAM) 50 MG tablet, Take 1 tablet (50 mg total) by mouth every 6 (six) hours as needed for up to 10 days. For severe pain,  Disp: 40 tablet, Rfl: 0   abemaciclib (VERZENIO) 100 MG tablet, Take 1 tablet (100 mg total) by mouth 2 (two) times daily. Swallow tablets whole. Do not chew, crush, or split tablets before swallowing., Disp: 56 tablet, Rfl: 3   anastrozole (ARIMIDEX) 1 MG tablet, Take 1 tablet (1 mg total) by mouth daily., Disp: 90 tablet, Rfl: 3   calcium carbonate (TUMS - DOSED IN MG ELEMENTAL CALCIUM) 500 MG chewable tablet, Chew 2 tablets by mouth every other day., Disp: , Rfl:    cholecalciferol (VITAMIN D3) 25 MCG (1000 UNIT) tablet, Take 1,000 Units by mouth daily., Disp: , Rfl:  Goserelin Acetate (ZOLADEX [place]), Inject 1 Dose into the skin every 30 (thirty) days. 10/24/21 was her last dose, Disp: , Rfl:    omeprazole (PRILOSEC) 20 MG capsule, Take 1 capsule (20 mg total) by mouth daily., Disp: 30 capsule, Rfl: 0   ondansetron (ZOFRAN-ODT) 4 MG disintegrating tablet, Take 1 tablet (4 mg total) by mouth every 8 (eight) hours as needed for nausea or vomiting., Disp: 8 tablet, Rfl: 0   potassium chloride SA (KLOR-CON M) 20 MEQ tablet, Take 1 tablet (20 mEq total) by mouth 2 (two) times daily., Disp: 30 tablet, Rfl: 2   Semaglutide (OZEMPIC, 0.25 OR 0.5 MG/DOSE, [place]), Inject 0.25 mg into the skin once a week. mondays, Disp: , Rfl:    Vitamin D, Ergocalciferol, (DRISDOL) 1.25 MG (50000 UNIT) CAPS capsule, Take 50,000 Units by mouth once a week., Disp: , Rfl:   PHYSICAL EXAM: ECOG FS:1 - Symptomatic but completely ambulatory    Vitals:   01/01/23 0900  BP: 131/71  Pulse: 86  Resp: 18  Temp: 99.2 F (37.3 C)  TempSrc: Tympanic  SpO2: 100%   Physical Exam Vitals and nursing note reviewed.  Constitutional:      Appearance: She is not ill-appearing or toxic-appearing.  HENT:     Head: Normocephalic.  Eyes:     Conjunctiva/sclera: Conjunctivae normal.  Cardiovascular:     Rate and Rhythm: Normal rate and regular rhythm.     Pulses: Normal pulses.     Heart sounds: Normal heart sounds.  Pulmonary:      Effort: Pulmonary effort is normal.     Breath sounds: Normal breath sounds.  Abdominal:     General: There is no distension.  Musculoskeletal:     Cervical back: Normal range of motion.     Comments: Negative straight leg raise bilaterally.  Full range of motion of the C-spine, T-spine and L-spine Tenderness to palpation of the spinous processes of the L-spine over L2 + L5 No crepitus, deformity or step-offs No tenderness to palpation of the paraspinous muscles of the L-spine   Skin:    General: Skin is warm and dry.  Neurological:     Mental Status: She is alert.     Comments: Speech is clear and goal oriented, follows commands Normal strength in upper and lower extremities bilaterally including dorsiflexion and plantar flexion, strong and equal grip strength Sensation normal to light and sharp touch Moves extremities without ataxia, coordination intact Normal finger to nose and rapid alternating movements Normal gait and balance. No saddle anesthesia          LABORATORY DATA: I have reviewed the data as listed    Latest Ref Rng & Units 12/31/2022    7:37 AM 09/30/2022    3:52 PM 07/02/2022   10:03 AM  CBC  WBC 4.0 - 10.5 K/uL 2.4  3.8  4.2   Hemoglobin 12.0 - 15.0 g/dL 19.6  22.2  97.9   Hematocrit 36.0 - 46.0 % 35.3  35.3  37.1   Platelets 150 - 400 K/uL 189  221  223         Latest Ref Rng & Units 12/31/2022    7:37 AM 09/30/2022    3:52 PM 07/02/2022   10:03 AM  CMP  Glucose 70 - 99 mg/dL 92  892  119   BUN 6 - 20 mg/dL 12  15  17    Creatinine 0.44 - 1.00 mg/dL 4.17  4.08  1.44   Sodium 135 - 145  mmol/L 141  144  140   Potassium 3.5 - 5.1 mmol/L 3.3  3.5  3.9   Chloride 98 - 111 mmol/L 108  108  107   CO2 22 - 32 mmol/L 27  29  29    Calcium 8.9 - 10.3 mg/dL 9.4  9.5  9.4   Total Protein 6.5 - 8.1 g/dL 7.5  7.0  7.1   Total Bilirubin 0.3 - 1.2 mg/dL 1.2  0.6  0.6   Alkaline Phos 38 - 126 U/L 44  50  54   AST 15 - 41 U/L 39  52  55   ALT 0 - 44 U/L 39  67   95        RADIOGRAPHIC STUDIES (from last 24 hours if applicable) I have personally reviewed the radiological images as listed and agreed with the findings in the report. No results found.      Visit Diagnosis: 1. Malignant neoplasm of upper-outer quadrant of left breast in female, estrogen receptor positive (HCC)   2. Metastasis to bone (HCC)   3. Cancer related pain      No orders of the defined types were placed in this encounter.   All questions were answered. The patient knows to call the clinic with any problems, questions or concerns. No barriers to learning was detected.  A total of more than 30 minutes were spent on this encounter with face-to-face time and non-face-to-face time, including preparing to see the patient, ordering medications, counseling the patient and coordination of care as outlined above.    Thank you for allowing me to participate in the care of this patient.    Shanon Ace, PA-C Department of Hematology/Oncology [facility] Phone: [phone]  [phone]    01/01/2023 9:55 AM

## 2023-01-01 NOTE — Telephone Encounter (Signed)
Notified Patient by voicemail of prior authorization approval for Tramadol 50 mg tablets. Medication is approved through 06/30/2023. Patient's Pharmacy notified of approval.

## 2023-01-02 NOTE — Progress Notes (Signed)
Patient Care Team: Leane Call, MD as PCP - General (Family Medicine) [name], RN as Oncology Nurse Navigator Pershing Proud, RN as Oncology Nurse Navigator Harriette Bouillon, MD as Consulting Physician (General Surgery) Dorothy Puffer, MD as Consulting Physician (Radiation Oncology) [name], MD as Consulting Physician (Hematology and Oncology)  DIAGNOSIS:  Encounter Diagnosis  Name Primary?   Malignant neoplasm of upper-outer quadrant of left breast in female, estrogen receptor positive (HCC) Yes    SUMMARY OF ONCOLOGIC HISTORY: Oncology History  Ductal carcinoma in situ (DCIS) of right breast  09/29/2020 Initial Diagnosis   Ductal carcinoma in situ (DCIS) of right breast   10/04/2020 Cancer Staging   Staging form: Breast, AJCC 8th Edition - Clinical: Stage 0 (cTis (DCIS), cN0, cM0) - Signed by Lowella Dell, MD on 10/04/2020   11/25/2020 Genetic Testing   Negative genetic testing:  No pathogenic variants detected on the [employer] CancerNext-Expanded + RNAinsight panel. The report date is 11/25/2020.   The CancerNext-Expanded + RNAinsight gene panel offered by [employer] and includes sequencing and rearrangement analysis for the following 77 genes: AIP, ALK, APC, ATM, AXIN2, BAP1, BARD1, BLM, BMPR1A, BRCA1, BRCA2, BRIP1, CDC73, CDH1, CDK4, CDKN1B, CDKN2A, CHEK2, CTNNA1, DICER1, FANCC, FH, FLCN, GALNT12, KIF1B, LZTR1, MAX, MEN1, MET, MLH1, MSH2, MSH3, MSH6, MUTYH, NBN, NF1, NF2, NTHL1, PALB2, PHOX2B, PMS2, POT1, PRKAR1A, PTCH1, PTEN, RAD51C, RAD51D, RB1, RECQL, RET, SDHA, SDHAF2, SDHB, SDHC, SDHD, SMAD4, SMARCA4, SMARCB1, SMARCE1, STK11, SUFU, TMEM127, TP53, TSC1, TSC2, VHL and XRCC2 (sequencing and deletion/duplication); EGFR, EGLN1, HOXB13, KIT, MITF, PDGFRA, POLD1 and POLE (sequencing only); EPCAM and GREM1 (deletion/duplication only). RNA data is routinely analyzed for use in variant interpretation for all genes.   Malignant neoplasm of upper-outer quadrant of  left breast in female, estrogen receptor positive (HCC)  09/29/2020 Initial Diagnosis   Malignant neoplasm of upper-outer quadrant of left breast in female, estrogen receptor positive (HCC)   10/04/2020 Cancer Staging   Staging form: Breast, AJCC 8th Edition - Clinical stage from 10/04/2020: Stage IIA (cT3, cN1(f), cM0, G2, ER+, PR+, HER2-) - Signed by Lowella Dell, MD on 10/04/2020 Stage prefix: Initial diagnosis Method of lymph node assessment: Core biopsy Histologic grading system: 3 grade system   10/25/2020 - 11/10/2020 Chemotherapy   2 cycles of Adriamycin and Cytoxan.  Chemo discontinued because of rising LFTs.       11/25/2020 Genetic Testing   Negative genetic testing:  No pathogenic variants detected on the [employer] CancerNext-Expanded + RNAinsight panel. The report date is 11/25/2020.   The CancerNext-Expanded + RNAinsight gene panel offered by [employer] and includes sequencing and rearrangement analysis for the following 77 genes: AIP, ALK, APC, ATM, AXIN2, BAP1, BARD1, BLM, BMPR1A, BRCA1, BRCA2, BRIP1, CDC73, CDH1, CDK4, CDKN1B, CDKN2A, CHEK2, CTNNA1, DICER1, FANCC, FH, FLCN, GALNT12, KIF1B, LZTR1, MAX, MEN1, MET, MLH1, MSH2, MSH3, MSH6, MUTYH, NBN, NF1, NF2, NTHL1, PALB2, PHOX2B, PMS2, POT1, PRKAR1A, PTCH1, PTEN, RAD51C, RAD51D, RB1, RECQL, RET, SDHA, SDHAF2, SDHB, SDHC, SDHD, SMAD4, SMARCA4, SMARCB1, SMARCE1, STK11, SUFU, TMEM127, TP53, TSC1, TSC2, VHL and XRCC2 (sequencing and deletion/duplication); EGFR, EGLN1, HOXB13, KIT, MITF, PDGFRA, POLD1 and POLE (sequencing only); EPCAM and GREM1 (deletion/duplication only). RNA data is routinely analyzed for use in variant interpretation for all genes.    Anti-estrogen oral therapy   anastrozole started 12/06/2020 goserelin started 10/11/2020  Verzinio started Aug 2022 zolendronate started 11/02/2018. Every 12 weeks.   01/31/2021 Surgery   Left mastectomy: Residual multifocal IDC 2 cm with intermediate grade DCIS, 15/15  lymph nodes  positive, margins negative, lymphovascular space invasion present, ER 60%, PR 10%, HER2 negative, Ki-67 15% Right mastectomy: Fibrocystic change no residual cancer or DCIS     CHIEF COMPLIANT: Follow-up of metastatic breast cancer on Verzinio Faslodex and Zometa    INTERVAL HISTORY: Gabriela Sullivan is a 32 year old with above-mentioned metastatic breast cancer was currently on Verzinio. She presents to the clinic for a follow-up. Patient reports she went to the hospital yesterday from vomiting. She believes it was a stomach bug. She states that the symptoms has resolved.    ALLERGIES:  has No Known Allergies.  MEDICATIONS:  Current Outpatient Medications  Medication Sig Dispense Refill   abemaciclib (VERZENIO) 100 MG tablet Take 1 tablet (100 mg total) by mouth 2 (two) times daily. Swallow tablets whole. Do not chew, crush, or split tablets before swallowing. 56 tablet 3   anastrozole (ARIMIDEX) 1 MG tablet Take 1 tablet (1 mg total) by mouth daily. 90 tablet 3   calcium carbonate (TUMS - DOSED IN MG ELEMENTAL CALCIUM) 500 MG chewable tablet Chew 2 tablets by mouth every other day.     MOUNJARO 5 MG/0.5ML Pen Inject 5 mg into the skin once a week.     potassium chloride (KLOR-CON) 10 MEQ tablet Take 1 tablet (10 mEq total) by mouth daily. 20 tablet 0   Vitamin D, Ergocalciferol, (DRISDOL) 1.25 MG (50000 UNIT) CAPS capsule Take 50,000 Units by mouth once a week.     omeprazole (PRILOSEC) 20 MG capsule Take 1 capsule (20 mg total) by mouth daily. (Patient not taking: Reported on 01/05/2023) 30 capsule 0   ondansetron (ZOFRAN-ODT) 4 MG disintegrating tablet Take 1 tablet (4 mg total) by mouth every 8 (eight) hours as needed for nausea or vomiting. (Patient not taking: Reported on 01/05/2023) 8 tablet 0   potassium chloride SA (KLOR-CON M) 20 MEQ tablet Take 1 tablet (20 mEq total) by mouth 2 (two) times daily. (Patient not taking: Reported on 01/05/2023) 30 tablet 2   traMADol (ULTRAM) 50 MG tablet Take  1 tablet (50 mg total) by mouth every 6 (six) hours as needed for up to 10 days. For severe pain (Patient not taking: Reported on 01/05/2023) 40 tablet 0   No current facility-administered medications for this visit.    PHYSICAL EXAMINATION: ECOG PERFORMANCE STATUS: 1 - Symptomatic but completely ambulatory  Vitals:   01/06/23 0921  BP: 136/78  Pulse: (!) 112  Resp: 18  Temp: 98.1 F (36.7 C)  SpO2: 100%   Filed Weights   01/06/23 0921  Weight: 177 lb (80.3 kg)      LABORATORY DATA:  I have reviewed the data as listed    Latest Ref Rng & Units 01/05/2023    8:32 PM 12/31/2022    7:37 AM 09/30/2022    3:52 PM  CMP  Glucose 70 - 99 mg/dL 98  92  409   BUN 6 - 20 mg/dL 12  12  15    Creatinine 0.44 - 1.00 mg/dL 8.11  9.14  7.82   Sodium 135 - 145 mmol/L 135  141  144   Potassium 3.5 - 5.1 mmol/L 2.8  3.3  3.5   Chloride 98 - 111 mmol/L 101  108  108   CO2 22 - 32 mmol/L 26  27  29    Calcium 8.9 - 10.3 mg/dL 8.2  9.4  9.5   Total Protein 6.5 - 8.1 g/dL 7.2  7.5  7.0   Total Bilirubin 0.3 -  1.2 mg/dL 2.0  1.2  0.6   Alkaline Phos 38 - 126 U/L 42  44  50   AST 15 - 41 U/L 47  39  52   ALT 0 - 44 U/L 38  39  67     Lab Results  Component Value Date   WBC 2.4 (L) 01/05/2023   HGB 10.4 (L) 01/05/2023   HCT 28.5 (L) 01/05/2023   MCV 94.7 01/05/2023   PLT 154 01/05/2023   NEUTROABS 1.5 (L) 01/05/2023    ASSESSMENT & PLAN:  Malignant neoplasm of upper-outer quadrant of left breast in female, estrogen receptor positive [facility]) 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% 09/25/2020: Right breast biopsy: DCIS grade 1, ER/PR positive 10/06/2020  breast MRI showed a 11 cm area of malignancy, with a second 1.9 cm mass, and skin thickening concerning for inflammatory breast carcinoma; at least 6 abnormal lymph nodes noted 10/13/2020 CT chest shows clear lungs and no obvious liver involvement but multiple lytic  bone lesions in addition to the findings and MRI above 10/21/2020 spine MRI: Shows widely scattered thoracic and lumbar vertebral body metastases 10/24/2020: 2 cycles of dose dense Adriamycin and Cytoxan discontinued because of rising LFTs 12/01/2020: MRI abdomen: No evidence of metastatic disease in the liver 01/31/2021:Left mastectomy: Residual multifocal IDC 2 cm with intermediate grade DCIS, 15/15 lymph nodes positive, margins negative, lymphovascular space invasion present, ER 60%, PR 10%, HER2 negative, Ki-67 15% Right mastectomy: Fibrocystic change no residual cancer or DCIS   Current treatment: Anastrozole with goserelin and Verzenio and Zometa    Anastrozole toxicities: Tolerating it very well   07/01/2022: Stable bone metastases similar 8 mm left axillary lymph node. 12/31/2022: CT CAP: No significant interval change in pulm metastases.  No evidence of new metastatic disease.  8 mm left axillary lymph node stable (stomach bug was in ED with nausea): Now resolved 01/05/2023: Abdominal pain: CT abdomen: Negative, CT head: Negative  Continue with Zometa every 3 months.  And monthly for Zoladex injections Return to clinic in 3 months with labs and follow-up. Scans will be done in 6 months.    No orders of the defined types were placed in this encounter.  The patient has a good understanding of the overall plan. she agrees with it. she will call with any problems that may develop before the next visit here. Total time spent: 30 mins including face to face time and time spent for planning, charting and co-ordination of care   Tamsen Meek, MD 01/06/23    I Janan Ridge am acting as a Neurosurgeon for [employer]  I have reviewed the above documentation for accuracy and completeness, and I agree with the above.

## 2023-01-05 ENCOUNTER — Emergency Department (HOSPITAL_COMMUNITY): Payer: Medicaid Other

## 2023-01-05 ENCOUNTER — Encounter (HOSPITAL_COMMUNITY): Payer: Self-pay

## 2023-01-05 ENCOUNTER — Emergency Department (HOSPITAL_COMMUNITY)
Admission: EM | Admit: 2023-01-05 | Discharge: 2023-01-05 | Disposition: A | Discharge status: Home / Self Care | Payer: Medicaid Other | Attending: Emergency Medicine | Admitting: Emergency Medicine

## 2023-01-05 ENCOUNTER — Other Ambulatory Visit: Payer: Self-pay

## 2023-01-05 DIAGNOSIS — E876 Hypokalemia: Secondary | ICD-10-CM

## 2023-01-05 DIAGNOSIS — D649 Anemia, unspecified: Secondary | ICD-10-CM | POA: Diagnosis not present

## 2023-01-05 DIAGNOSIS — D72819 Decreased white blood cell count, unspecified: Secondary | ICD-10-CM | POA: Insufficient documentation

## 2023-01-05 DIAGNOSIS — R112 Nausea with vomiting, unspecified: Secondary | ICD-10-CM

## 2023-01-05 DIAGNOSIS — D0511 Intraductal carcinoma in situ of right breast: Secondary | ICD-10-CM | POA: Diagnosis not present

## 2023-01-05 DIAGNOSIS — C7951 Secondary malignant neoplasm of bone: Secondary | ICD-10-CM | POA: Diagnosis not present

## 2023-01-05 DIAGNOSIS — Z1152 Encounter for screening for COVID-19: Secondary | ICD-10-CM | POA: Diagnosis not present

## 2023-01-05 LAB — CBC
HCT: 28.5 % — ABNORMAL LOW (ref 36.0–46.0)
Hemoglobin: 10.4 g/dL — ABNORMAL LOW (ref 12.0–15.0)
MCH: 34.6 pg — ABNORMAL HIGH (ref 26.0–34.0)
MCHC: 36.5 g/dL — ABNORMAL HIGH (ref 30.0–36.0)
MCV: 94.7 fL (ref 80.0–100.0)
Platelets: 154 10*3/uL (ref 150–400)
RBC: 3.01 MIL/uL — ABNORMAL LOW (ref 3.87–5.11)
RDW: 13.8 % (ref 11.5–15.5)
WBC: 2.4 10*3/uL — ABNORMAL LOW (ref 4.0–10.5)
nRBC: 0 % (ref 0.0–0.2)

## 2023-01-05 LAB — URINALYSIS, ROUTINE W REFLEX MICROSCOPIC
Bilirubin Urine: NEGATIVE
Glucose, UA: NEGATIVE mg/dL
Hgb urine dipstick: NEGATIVE
Ketones, ur: NEGATIVE mg/dL
Leukocytes,Ua: NEGATIVE
Nitrite: NEGATIVE
Protein, ur: NEGATIVE mg/dL
Specific Gravity, Urine: 1.011 (ref 1.005–1.030)
pH: 6 (ref 5.0–8.0)

## 2023-01-05 LAB — COMPREHENSIVE METABOLIC PANEL
ALT: 38 U/L (ref 0–44)
AST: 47 U/L — ABNORMAL HIGH (ref 15–41)
Albumin: 3.9 g/dL (ref 3.5–5.0)
Alkaline Phosphatase: 42 U/L (ref 38–126)
Anion gap: 8 (ref 5–15)
BUN: 12 mg/dL (ref 6–20)
CO2: 26 mmol/L (ref 22–32)
Calcium: 8.2 mg/dL — ABNORMAL LOW (ref 8.9–10.3)
Chloride: 101 mmol/L (ref 98–111)
Creatinine, Ser: 0.68 mg/dL (ref 0.44–1.00)
GFR, Estimated: >60 mL/min (ref >60)
Glucose, Bld: 98 mg/dL (ref 70–99)
Potassium: 2.8 mmol/L — ABNORMAL LOW (ref 3.5–5.1)
Sodium: 135 mmol/L (ref 135–145)
Total Bilirubin: 2 mg/dL — ABNORMAL HIGH (ref 0.3–1.2)
Total Protein: 7.2 g/dL (ref 6.5–8.1)

## 2023-01-05 LAB — RESP PANEL BY RT-PCR (RSV, FLU A&B, COVID)  RVPGX2
Influenza A by PCR: NEGATIVE
Influenza B by PCR: NEGATIVE
Resp Syncytial Virus by PCR: NEGATIVE
SARS Coronavirus 2 by RT PCR: NEGATIVE

## 2023-01-05 LAB — DIFFERENTIAL
Abs Immature Granulocytes: 0.01 10*3/uL (ref 0.00–0.07)
Basophils Absolute: 0.1 10*3/uL (ref 0.0–0.1)
Basophils Relative: 3 %
Eosinophils Absolute: 0.1 10*3/uL (ref 0.0–0.5)
Eosinophils Relative: 3 %
Immature Granulocytes: 0 %
Lymphocytes Relative: 25 %
Lymphs Abs: 0.6 10*3/uL — ABNORMAL LOW (ref 0.7–4.0)
Monocytes Absolute: 0.2 10*3/uL (ref 0.1–1.0)
Monocytes Relative: 7 %
Neutro Abs: 1.5 10*3/uL — ABNORMAL LOW (ref 1.7–7.7)
Neutrophils Relative %: 62 %

## 2023-01-05 LAB — LIPASE, BLOOD: Lipase: 60 U/L — ABNORMAL HIGH (ref 11–51)

## 2023-01-05 LAB — HCG, SERUM, QUALITATIVE: Preg, Serum: NEGATIVE

## 2023-01-05 MED ORDER — HEPARIN SOD (PORK) LOCK FLUSH 100 UNIT/ML IV SOLN
500.0000 [IU] | Freq: Once | INTRAVENOUS | Status: AC
  Administered 2023-01-05: 500 [IU]
  Filled 2023-01-05: qty 5

## 2023-01-05 MED ORDER — LACTATED RINGERS IV BOLUS
1000.0000 mL | Freq: Once | INTRAVENOUS | Status: AC
  Administered 2023-01-05: 1000 mL via INTRAVENOUS

## 2023-01-05 MED ORDER — PROCHLORPERAZINE EDISYLATE 10 MG/2ML IJ SOLN
10.0000 mg | Freq: Once | INTRAMUSCULAR | Status: AC
  Administered 2023-01-05: 10 mg via INTRAVENOUS
  Filled 2023-01-05: qty 2

## 2023-01-05 MED ORDER — POTASSIUM CHLORIDE ER 10 MEQ PO TBCR: 10.0000 meq | EXTENDED_RELEASE_TABLET | Freq: Every day | ORAL | 0 refills | Status: AC

## 2023-01-05 MED ORDER — DIPHENHYDRAMINE HCL 50 MG/ML IJ SOLN
25.0000 mg | Freq: Once | INTRAMUSCULAR | Status: AC
  Administered 2023-01-05: 25 mg via INTRAVENOUS
  Filled 2023-01-05: qty 1

## 2023-01-05 MED ORDER — ACETAMINOPHEN 325 MG PO TABS
650.0000 mg | ORAL_TABLET | Freq: Once | ORAL | Status: AC
  Administered 2023-01-05: 650 mg via ORAL
  Filled 2023-01-05: qty 2

## 2023-01-05 MED ORDER — KETOROLAC TROMETHAMINE 15 MG/ML IJ SOLN
15.0000 mg | Freq: Once | INTRAMUSCULAR | Status: AC
  Administered 2023-01-05: 15 mg via INTRAVENOUS
  Filled 2023-01-05: qty 1

## 2023-01-05 MED ORDER — MAGNESIUM SULFATE 2 GM/50ML IV SOLN
2.0000 g | Freq: Once | INTRAVENOUS | Status: AC
  Administered 2023-01-05: 2 g via INTRAVENOUS
  Filled 2023-01-05: qty 50

## 2023-01-05 MED ORDER — IOHEXOL 300 MG/ML  SOLN
100.0000 mL | Freq: Once | INTRAMUSCULAR | Status: AC | PRN
  Administered 2023-01-05: 100 mL via INTRAVENOUS

## 2023-01-05 MED ORDER — POTASSIUM CHLORIDE CRYS ER 20 MEQ PO TBCR
40.0000 meq | EXTENDED_RELEASE_TABLET | Freq: Once | ORAL | Status: AC
  Administered 2023-01-05: 40 meq via ORAL
  Filled 2023-01-05: qty 2

## 2023-01-05 NOTE — ED Triage Notes (Signed)
Pt. Arrives POV c/o nausea and vomiting x1 week. She denies diarrhea. She also endorses a headache and back pain.

## 2023-01-05 NOTE — ED Notes (Signed)
Pt was given a cup of ice water per PA approval. Pt also notified of need for UA sample

## 2023-01-05 NOTE — Discharge Instructions (Signed)
Please take the potassium tablets I have prescribed once daily until the prescription runs out, and follow-up with your regular doctors for recheck of your potassium. I suspect you may have had a viral stomach infection which hopefully will resolve on its own with some headache and worsening pain from mild dehydration.

## 2023-01-05 NOTE — ED Provider Notes (Signed)
[facility] AT [facility] Provider Note   CSN: [id number] Arrival date & time: 01/05/23  1921     History  Chief Complaint  Patient presents with   Emesis    Gabriela Sullivan is a 32 y.o. female with past medical history significant for ductal carcinoma of right breast, bone metastasis, she is on current immunotherapy but denies radiation, chemotherapy.  Patient reports that she has some chronic back pain related to her metastasis, has been taking tramadol as needed for pain.  Reports worsening back pain, nausea vomiting for 1 week.  She reports that she began having a headache worse today that she feels at her temples.  She denies any numbness, tingling, vision changes, weakness, confusion.  She reports 2-3 episodes of emesis per day, denies hematemesis, denies hematochezia.   Emesis      Home Medications Prior to Admission medications   Medication Sig Start Date End Date Taking? Authorizing Provider  abemaciclib (VERZENIO) 100 MG tablet Take 1 tablet (100 mg total) by mouth 2 (two) times daily. Swallow tablets whole. Do not chew, crush, or split tablets before swallowing. 10/08/22  Yes [name], MD  anastrozole (ARIMIDEX) 1 MG tablet Take 1 tablet (1 mg total) by mouth daily. 04/09/22  Yes [name], MD  calcium carbonate (TUMS - DOSED IN MG ELEMENTAL CALCIUM) 500 MG chewable tablet Chew 2 tablets by mouth every other day.   Yes [provider]  MOUNJARO 5 MG/0.5ML Pen Inject 5 mg into the skin once a week. 11/21/22  Yes [provider]  potassium chloride (KLOR-CON) 10 MEQ tablet Take 1 tablet (10 mEq total) by mouth daily. 01/05/23  Yes ,  H, PA-C  Vitamin D, Ergocalciferol, (DRISDOL) 1.25 MG (50000 UNIT) CAPS capsule Take 50,000 Units by mouth once a week. 06/20/22  Yes [provider]  omeprazole (PRILOSEC) 20 MG capsule Take 1 capsule (20 mg total) by mouth daily. Patient not taking: Reported on 01/05/2023  08/04/21   Antony Madura, PA-C  ondansetron (ZOFRAN-ODT) 4 MG disintegrating tablet Take 1 tablet (4 mg total) by mouth every 8 (eight) hours as needed for nausea or vomiting. Patient not taking: Reported on 01/05/2023 10/26/21   Benjiman Core, MD  potassium chloride SA (KLOR-CON M) 20 MEQ tablet Take 1 tablet (20 mEq total) by mouth 2 (two) times daily. Patient not taking: Reported on 01/05/2023 07/24/21   [name], MD  traMADol (ULTRAM) 50 MG tablet Take 1 tablet (50 mg total) by mouth every 6 (six) hours as needed for up to 10 days. For severe pain Patient not taking: Reported on 01/05/2023 01/01/23 01/11/23  Shanon Ace, PA-C  prochlorperazine (COMPAZINE) 10 MG tablet Take 1 tablet (10 mg total) by mouth every 6 (six) hours as needed (Nausea or vomiting). 10/04/20 12/21/20  Magrinat, Valentino Hue, MD      Allergies    Patient has no known allergies.    Review of Systems   Review of Systems  Gastrointestinal:  Positive for vomiting.  All other systems reviewed and are negative.   Physical Exam Updated Vital Signs BP (!) 141/82 (BP Location: Right Arm)   Pulse (!) 108   Temp 100.3 F (37.9 C) (Oral)   Resp 17   SpO2 100%  Physical Exam Vitals and nursing note reviewed.  Constitutional:      General: She is not in acute distress.    Appearance: Normal appearance.  HENT:     Head: Normocephalic and atraumatic.  Eyes:     General:        Right eye: No discharge.        Left eye: No discharge.  Cardiovascular:     Rate and Rhythm: Normal rate and regular rhythm.  Pulmonary:     Effort: Pulmonary effort is normal. No respiratory distress.  Abdominal:     Comments: Mild diffuse abdominal tenderness, no rebound, rigidity, guarding, no focal right upper quadrant tenderness on exam  Musculoskeletal:        General: No deformity.  Skin:    General: Skin is warm and dry.  Neurological:     Mental Status: She is alert and oriented to person, place, and time.  Psychiatric:         Mood and Affect: Mood normal.        Behavior: Behavior normal.     ED Results / Procedures / Treatments   Labs (all labs ordered are listed, but only abnormal results are displayed) Labs Reviewed  LIPASE, BLOOD - Abnormal; Notable for the following components:      Result Value   Lipase 60 (*)    All other components within normal limits  COMPREHENSIVE METABOLIC PANEL - Abnormal; Notable for the following components:   Potassium 2.8 (*)    Calcium 8.2 (*)    AST 47 (*)    Total Bilirubin 2.0 (*)    All other components within normal limits  CBC - Abnormal; Notable for the following components:   WBC 2.4 (*)    RBC 3.01 (*)    Hemoglobin 10.4 (*)    HCT 28.5 (*)    MCH 34.6 (*)    MCHC 36.5 (*)    All other components within normal limits  DIFFERENTIAL - Abnormal; Notable for the following components:   Neutro Abs 1.5 (*)    Lymphs Abs 0.6 (*)    All other components within normal limits  RESP PANEL BY RT-PCR (RSV, FLU A&B, COVID)  [id number]  URINALYSIS, ROUTINE W REFLEX MICROSCOPIC  HCG, SERUM, QUALITATIVE    EKG None  Radiology CT ABDOMEN PELVIS W CONTRAST  Result Date: 01/05/2023 CLINICAL DATA:  Abdominal pain, nausea, vomiting EXAM: CT ABDOMEN AND PELVIS WITH CONTRAST TECHNIQUE: Multidetector CT imaging of the abdomen and pelvis was performed using the standard protocol following bolus administration of intravenous contrast. RADIATION DOSE REDUCTION: This exam was performed according to the departmental dose-optimization program which includes automated exposure control, adjustment of the mA and/or kV according to patient size and/or use of iterative reconstruction technique. CONTRAST:  OMNIPAQUE IOHEXOL 300 MG/ML  SOLN COMPARISON:  12/31/2022 FINDINGS: Lower chest: No acute abnormality Hepatobiliary: No focal hepatic abnormality. Gallbladder unremarkable. Pancreas: No focal abnormality or ductal dilatation. Spleen: No focal abnormality.  Normal size.  Adrenals/Urinary Tract: No adrenal abnormality. No focal renal abnormality. No stones or hydronephrosis. Urinary bladder is unremarkable. Stomach/Bowel: Stomach, large and small bowel grossly unremarkable. Normal appendix. Vascular/Lymphatic: No evidence of aneurysm or adenopathy. Reproductive: Uterus and adnexa unremarkable.  No mass. Other: No free fluid or free air. Musculoskeletal: Scattered lucent lesions within T11, L2 and L5 vertebral bodies as well as the right pelvis and left iliac bone. Mixed lytic and sclerotic sacral lesion. These are stable since prior study. IMPRESSION: No acute findings in the abdomen or pelvis. Stable osseous metastatic disease. Electronically Signed   By: Charlett Nose M.D.   On: 01/05/2023 21:48   CT Head Wo Contrast  Result Date: 01/05/2023 CLINICAL DATA:  Sudden severe  headache. Nausea and vomiting for 1 week. EXAM: CT HEAD WITHOUT CONTRAST TECHNIQUE: Contiguous axial images were obtained from the base of the skull through the vertex without intravenous contrast. RADIATION DOSE REDUCTION: This exam was performed according to the departmental dose-optimization program which includes automated exposure control, adjustment of the mA and/or kV according to patient size and/or use of iterative reconstruction technique. COMPARISON:  CT brain 08/04/2021 FINDINGS: Brain: The ventricles are normal in size and configuration. The basilar cisterns are patent. No mass, mass effect, or midline shift. No acute intracranial hemorrhage is seen. No abnormal extra-axial fluid collection. Preservation of the normal cortical gray-white interface without CT evidence of an acute major vascular territorial cortical based infarction. Vascular: No hyperdense vessel or unexpected calcification. Skull: Normal. Negative for fracture or focal lesion. Sinuses/Orbits: The visualized orbits are unremarkable. There is a mild-to-moderate inferior right maxillary sinus mucosal polyp. This is grossly similar to  prior on the available comparison study in which less of the right maxillary sinuses imaged. Otherwise, the visualized paranasal sinuses and mastoid air cells are clear. No acute calvarial fracture is seen. Other: None. IMPRESSION: 1. No acute intracranial process is seen. 2. Mild-to-moderate inferior right maxillary sinus mucosal polyp. Electronically Signed   By: Neita Garnet M.D.   On: 01/05/2023 20:28    Procedures Procedures    Medications Ordered in ED Medications  heparin lock flush 100 unit/mL (has no administration in time range)  lactated ringers bolus 1,000 mL (0 mLs Intravenous Stopped 01/05/23 2228)  prochlorperazine (COMPAZINE) injection 10 mg (10 mg Intravenous Given 01/05/23 2020)  diphenhydrAMINE (BENADRYL) injection 25 mg (25 mg Intravenous Given 01/05/23 2018)  ketorolac (TORADOL) 15 MG/ML injection 15 mg (15 mg Intravenous Given 01/05/23 2018)  magnesium sulfate IVPB 2 g 50 mL (0 g Intravenous Stopped 01/05/23 2228)  potassium chloride SA (KLOR-CON M) CR tablet 40 mEq (40 mEq Oral Given 01/05/23 2136)  acetaminophen (TYLENOL) tablet 650 mg (650 mg Oral Given 01/05/23 2136)  iohexol (OMNIPAQUE) 300 MG/ML solution 100 mL (100 mLs Intravenous Contrast Given 01/05/23 2122)    ED Course/ Medical Decision Making/ A&P                                 Medical Decision Making Amount and/or Complexity of Data Reviewed Labs: ordered. Radiology: ordered.  Risk Prescription drug management.   This patient is a 32 y.o. female who presents to the ED for concern of abdominal pain, this involves an extensive number of treatment options, and is a complaint that carries with it a high risk of complications and morbidity. The emergent differential diagnosis prior to evaluation includes, but is not limited to,  The causes of generalized abdominal pain include but are not limited to AAA, mesenteric ischemia, appendicitis, diverticulitis, DKA, gastritis, gastroenteritis, AMI, nephrolithiasis,  pancreatitis, peritonitis, adrenal insufficiency,lead poisoning, iron toxicity, intestinal ischemia, constipation, UTI,SBO/LBO, splenic rupture, biliary disease, IBD, IBS, PUD, or hepatitis. In context of her known metastatic disease considered metastatic disease, bowel obstruction, for her headache considered Stroke, increased ICP, meningitis, CVA, intracranial tumor, venous sinus thrombosis, migraine, cluster headache, hypertension, drug related, head injury, tension headache, sinusitis, dental abscess, otitis media, TMJ. This is not an exhaustive differential.   Past Medical History / Co-morbidities / Social History: ductal carcinoma of right breast, bone metastasis, she is on current immunotherapy but denies radiation, chemotherapy  Additional history: Chart reviewed. Pertinent results include: Extensively reviewed lab work, imaging, notably  with recent CT chest abdomen pelvis just 5 days ago which showed no abnormalities other than her known bony metastasis  Physical Exam: Physical exam performed. The pertinent findings include: 0 patient with elevated temperature 100.3, as well as pulse of 108  Lab Tests: I ordered, and personally interpreted labs.  The pertinent results include: CMP is notable for mild hypokalemia potassium 2.8, mildly elevated AST at 47, total bilirubin at 2.0 is elevated compared to recent baseline of 1.2 just a few days ago.  CBC is notable for mild leukocytopenia, white blood cells 2.4, anemia, hemoglobin 10.4, not significantly different than recent baseline.  Serum pregnancy test negative, UA unremarkable, RVP negative for COVID, flu, RSV.   Imaging Studies: I ordered imaging studies including CT head without contrast, CT abdomen pelvis with contrast. I independently visualized and interpreted imaging which showed no evidence of acute intra-abdominal, intracranial abnormality other than noted sinus polyp. I agree with the radiologist interpretation.   Medications: I  ordered medication including migraine cocktail for headache, potassium for hypokalemia, Tylenol for fever. Reevaluation of the patient after these medicines showed that the patient improved. I have reviewed the patients home medicines and have made adjustments as needed.   Disposition: After consideration of the diagnostic results and the patients response to treatment, I feel that patient with no evidence of acute surgical or infectious abnormality on her workup, mild elevation of lipase, bilirubin, AST could suggest developing biliary tract disease, versus may be secondary to dehydration.  Patient is feeling significantly improved after medications, does not have any significant focal abdominal pain on reassessment, and feels comfortable with discharge with close PCP follow-up for potassium recheck.  I think this is reasonable in context of her marked improvement in the ED.   emergency department workup does not suggest an emergent condition requiring admission or immediate intervention beyond what has been performed at this time. The plan is: as above. The patient is safe for discharge and has been instructed to return immediately for worsening symptoms, change in symptoms or any other concerns.  I discussed this case with my attending physician Dr. Particia Nearing who cosigned this note including patient's presenting symptoms, physical exam, and planned diagnostics and interventions. Attending physician stated agreement with plan or made changes to plan which were implemented.    Final Clinical Impression(s) / ED Diagnoses Final diagnoses:  Nausea and vomiting, unspecified vomiting type  Hypokalemia    Rx / DC Orders ED Discharge Orders          Ordered    potassium chloride (KLOR-CON) 10 MEQ tablet  Daily        01/05/23 2201              [place] 01/05/23 2243    Jacalyn Lefevre, MD 01/05/23 2252

## 2023-01-06 ENCOUNTER — Inpatient Hospital Stay: Payer: Medicaid Other | Attending: Oncology | Admitting: Hematology and Oncology

## 2023-01-06 VITALS — BP 136/78 | HR 112 | Temp 98.1°F | Resp 18 | Ht 60.5 in | Wt 177.0 lb

## 2023-01-06 DIAGNOSIS — Z79811 Long term (current) use of aromatase inhibitors: Secondary | ICD-10-CM | POA: Insufficient documentation

## 2023-01-06 DIAGNOSIS — C50412 Malignant neoplasm of upper-outer quadrant of left female breast: Secondary | ICD-10-CM | POA: Diagnosis not present

## 2023-01-06 DIAGNOSIS — Z9013 Acquired absence of bilateral breasts and nipples: Secondary | ICD-10-CM | POA: Insufficient documentation

## 2023-01-06 DIAGNOSIS — C7951 Secondary malignant neoplasm of bone: Secondary | ICD-10-CM | POA: Insufficient documentation

## 2023-01-06 DIAGNOSIS — Z5111 Encounter for antineoplastic chemotherapy: Secondary | ICD-10-CM | POA: Diagnosis present

## 2023-01-06 DIAGNOSIS — Z170 Estrogen receptor positive status [ER+]: Secondary | ICD-10-CM | POA: Insufficient documentation

## 2023-01-06 DIAGNOSIS — C7800 Secondary malignant neoplasm of unspecified lung: Secondary | ICD-10-CM | POA: Diagnosis not present

## 2023-01-06 NOTE — Assessment & Plan Note (Signed)
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% 09/25/2020: Right breast biopsy: DCIS grade 1, ER/PR positive 10/06/2020  breast MRI showed a 11 cm area of malignancy, with a second 1.9 cm mass, and skin thickening concerning for inflammatory breast carcinoma; at least 6 abnormal lymph nodes noted 10/13/2020 CT chest shows clear lungs and no obvious liver involvement but multiple lytic bone lesions in addition to the findings and MRI above 10/21/2020 spine MRI: Shows widely scattered thoracic and lumbar vertebral body metastases 10/24/2020: 2 cycles of dose dense Adriamycin and Cytoxan discontinued because of rising LFTs 12/01/2020: MRI abdomen: No evidence of metastatic disease in the liver 01/31/2021:Left mastectomy: Residual multifocal IDC 2 cm with intermediate grade DCIS, 15/15 lymph nodes positive, margins negative, lymphovascular space invasion present, ER 60%, PR 10%, HER2 negative, Ki-67 15% Right mastectomy: Fibrocystic change no residual cancer or DCIS   Current treatment: Anastrozole with goserelin and Verzenio and Zometa    Anastrozole toxicities: Tolerating it very well   07/01/2022: Stable bone metastases similar 8 mm left axillary lymph node. 12/31/2022: CT CAP: No significant interval change in pulm metastases.  No evidence of new metastatic disease.  8 mm left axillary lymph node stable 01/05/2023: Abdominal pain: CT abdomen: Negative, CT head: Negative  Continue with Zometa every 3 months.  And monthly for Zoladex injections Return to clinic in 3 months with labs and follow-up. Scans will be done in 6 months.

## 2023-01-08 ENCOUNTER — Inpatient Hospital Stay: Payer: Medicaid Other

## 2023-01-09 ENCOUNTER — Other Ambulatory Visit: Payer: Self-pay

## 2023-01-09 ENCOUNTER — Inpatient Hospital Stay (HOSPITAL_COMMUNITY)
Admission: EM | Admit: 2023-01-09 | Discharge: 2023-01-13 | DRG: 872 | Disposition: A | Discharge status: Home / Self Care | Payer: Medicaid Other | Attending: Internal Medicine | Admitting: Internal Medicine

## 2023-01-09 ENCOUNTER — Emergency Department (HOSPITAL_COMMUNITY): Payer: Medicaid Other

## 2023-01-09 ENCOUNTER — Encounter (HOSPITAL_COMMUNITY): Payer: Self-pay | Admitting: Emergency Medicine

## 2023-01-09 DIAGNOSIS — R748 Abnormal levels of other serum enzymes: Secondary | ICD-10-CM | POA: Diagnosis present

## 2023-01-09 DIAGNOSIS — D709 Neutropenia, unspecified: Secondary | ICD-10-CM | POA: Diagnosis present

## 2023-01-09 DIAGNOSIS — C50412 Malignant neoplasm of upper-outer quadrant of left female breast: Secondary | ICD-10-CM | POA: Diagnosis present

## 2023-01-09 DIAGNOSIS — E669 Obesity, unspecified: Secondary | ICD-10-CM | POA: Diagnosis present

## 2023-01-09 DIAGNOSIS — Z800 Family history of malignant neoplasm of digestive organs: Secondary | ICD-10-CM

## 2023-01-09 DIAGNOSIS — Z8041 Family history of malignant neoplasm of ovary: Secondary | ICD-10-CM

## 2023-01-09 DIAGNOSIS — D849 Immunodeficiency, unspecified: Secondary | ICD-10-CM | POA: Diagnosis present

## 2023-01-09 DIAGNOSIS — Z170 Estrogen receptor positive status [ER+]: Secondary | ICD-10-CM

## 2023-01-09 DIAGNOSIS — E119 Type 2 diabetes mellitus without complications: Secondary | ICD-10-CM | POA: Diagnosis present

## 2023-01-09 DIAGNOSIS — K7581 Nonalcoholic steatohepatitis (NASH): Secondary | ICD-10-CM | POA: Diagnosis present

## 2023-01-09 DIAGNOSIS — C50919 Malignant neoplasm of unspecified site of unspecified female breast: Secondary | ICD-10-CM

## 2023-01-09 DIAGNOSIS — Z7985 Long-term (current) use of injectable non-insulin antidiabetic drugs: Secondary | ICD-10-CM

## 2023-01-09 DIAGNOSIS — E282 Polycystic ovarian syndrome: Secondary | ICD-10-CM | POA: Diagnosis present

## 2023-01-09 DIAGNOSIS — A419 Sepsis, unspecified organism: Principal | ICD-10-CM | POA: Diagnosis present

## 2023-01-09 DIAGNOSIS — Z79811 Long term (current) use of aromatase inhibitors: Secondary | ICD-10-CM

## 2023-01-09 DIAGNOSIS — E871 Hypo-osmolality and hyponatremia: Secondary | ICD-10-CM | POA: Diagnosis present

## 2023-01-09 DIAGNOSIS — R7401 Elevation of levels of liver transaminase levels: Secondary | ICD-10-CM | POA: Insufficient documentation

## 2023-01-09 DIAGNOSIS — C773 Secondary and unspecified malignant neoplasm of axilla and upper limb lymph nodes: Secondary | ICD-10-CM | POA: Diagnosis present

## 2023-01-09 DIAGNOSIS — R509 Fever, unspecified: Principal | ICD-10-CM

## 2023-01-09 DIAGNOSIS — C7951 Secondary malignant neoplasm of bone: Secondary | ICD-10-CM | POA: Diagnosis present

## 2023-01-09 DIAGNOSIS — T451X5A Adverse effect of antineoplastic and immunosuppressive drugs, initial encounter: Secondary | ICD-10-CM | POA: Diagnosis present

## 2023-01-09 DIAGNOSIS — Z8042 Family history of malignant neoplasm of prostate: Secondary | ICD-10-CM

## 2023-01-09 DIAGNOSIS — M899 Disorder of bone, unspecified: Secondary | ICD-10-CM | POA: Diagnosis present

## 2023-01-09 DIAGNOSIS — Z803 Family history of malignant neoplasm of breast: Secondary | ICD-10-CM

## 2023-01-09 DIAGNOSIS — R1013 Epigastric pain: Secondary | ICD-10-CM

## 2023-01-09 DIAGNOSIS — R112 Nausea with vomiting, unspecified: Secondary | ICD-10-CM | POA: Diagnosis present

## 2023-01-09 DIAGNOSIS — Z9013 Acquired absence of bilateral breasts and nipples: Secondary | ICD-10-CM

## 2023-01-09 DIAGNOSIS — D6481 Anemia due to antineoplastic chemotherapy: Secondary | ICD-10-CM | POA: Diagnosis present

## 2023-01-09 DIAGNOSIS — R5081 Fever presenting with conditions classified elsewhere: Secondary | ICD-10-CM | POA: Diagnosis present

## 2023-01-09 DIAGNOSIS — D61818 Other pancytopenia: Secondary | ICD-10-CM | POA: Diagnosis present

## 2023-01-09 DIAGNOSIS — E876 Hypokalemia: Secondary | ICD-10-CM | POA: Diagnosis present

## 2023-01-09 LAB — CBC WITH DIFFERENTIAL/PLATELET
Abs Immature Granulocytes: 0.01 10*3/uL (ref 0.00–0.07)
Basophils Absolute: 0.1 10*3/uL (ref 0.0–0.1)
Basophils Relative: 2 %
Eosinophils Absolute: 0 10*3/uL (ref 0.0–0.5)
Eosinophils Relative: 1 %
HCT: 25 % — ABNORMAL LOW (ref 36.0–46.0)
Hemoglobin: 8.8 g/dL — ABNORMAL LOW (ref 12.0–15.0)
Immature Granulocytes: 0 %
Lymphocytes Relative: 34 %
Lymphs Abs: 1 10*3/uL (ref 0.7–4.0)
MCH: 34.1 pg — ABNORMAL HIGH (ref 26.0–34.0)
MCHC: 35.2 g/dL (ref 30.0–36.0)
MCV: 96.9 fL (ref 80.0–100.0)
Monocytes Absolute: 0.2 10*3/uL (ref 0.1–1.0)
Monocytes Relative: 5 %
Neutro Abs: 1.7 10*3/uL (ref 1.7–7.7)
Neutrophils Relative %: 58 %
Platelets: 137 10*3/uL — ABNORMAL LOW (ref 150–400)
RBC: 2.58 MIL/uL — ABNORMAL LOW (ref 3.87–5.11)
RDW: 14.1 % (ref 11.5–15.5)
WBC: 3 10*3/uL — ABNORMAL LOW (ref 4.0–10.5)
nRBC: 0 % (ref 0.0–0.2)

## 2023-01-09 LAB — URINALYSIS, W/ REFLEX TO CULTURE (INFECTION SUSPECTED)
Bacteria, UA: NONE SEEN
Bilirubin Urine: NEGATIVE
Glucose, UA: NEGATIVE mg/dL
Hgb urine dipstick: NEGATIVE
Ketones, ur: 20 mg/dL — AB
Leukocytes,Ua: NEGATIVE
Nitrite: NEGATIVE
Protein, ur: NEGATIVE mg/dL
Specific Gravity, Urine: 1.023 (ref 1.005–1.030)
pH: 5 (ref 5.0–8.0)

## 2023-01-09 LAB — PROTIME-INR
INR: 1.2 (ref 0.8–1.2)
Prothrombin Time: 15.8 seconds — ABNORMAL HIGH (ref 11.4–15.2)

## 2023-01-09 LAB — COMPREHENSIVE METABOLIC PANEL
ALT: 50 U/L — ABNORMAL HIGH (ref 0–44)
AST: 64 U/L — ABNORMAL HIGH (ref 15–41)
Albumin: 3.7 g/dL (ref 3.5–5.0)
Alkaline Phosphatase: 38 U/L (ref 38–126)
Anion gap: 8 (ref 5–15)
BUN: 13 mg/dL (ref 6–20)
CO2: 23 mmol/L (ref 22–32)
Calcium: 8.1 mg/dL — ABNORMAL LOW (ref 8.9–10.3)
Chloride: 100 mmol/L (ref 98–111)
Creatinine, Ser: 0.67 mg/dL (ref 0.44–1.00)
GFR, Estimated: >60 mL/min (ref >60)
Glucose, Bld: 99 mg/dL (ref 70–99)
Potassium: 3.2 mmol/L — ABNORMAL LOW (ref 3.5–5.1)
Sodium: 131 mmol/L — ABNORMAL LOW (ref 135–145)
Total Bilirubin: 1.3 mg/dL — ABNORMAL HIGH (ref 0.3–1.2)
Total Protein: 7 g/dL (ref 6.5–8.1)

## 2023-01-09 LAB — APTT: aPTT: 33 seconds (ref 24–36)

## 2023-01-09 LAB — I-STAT CG4 LACTIC ACID, ED: Lactic Acid, Venous: 0.5 mmol/L (ref 0.5–1.9)

## 2023-01-09 MED ORDER — LACTATED RINGERS IV BOLUS
1000.0000 mL | Freq: Once | INTRAVENOUS | Status: AC
  Administered 2023-01-09: 1000 mL via INTRAVENOUS

## 2023-01-09 MED ORDER — ACETAMINOPHEN 500 MG PO TABS
1000.0000 mg | ORAL_TABLET | Freq: Once | ORAL | Status: AC
  Administered 2023-01-09: 1000 mg via ORAL
  Filled 2023-01-09: qty 2

## 2023-01-09 MED ORDER — POTASSIUM CHLORIDE 10 MEQ/100ML IV SOLN
10.0000 meq | Freq: Once | INTRAVENOUS | Status: AC
  Administered 2023-01-10: 10 meq via INTRAVENOUS
  Filled 2023-01-09: qty 100

## 2023-01-09 NOTE — ED Triage Notes (Signed)
Patient coming to ED for evaluation of abdominal pain.  Reports she was seen during the weekend for nausea and vomiting.  On Tuesday, patient began to have abdominal pain.  States pain is burning and radiates into L flank.  Has had intermittent fevers.  Patient currently receiving chemo for breast cancer.

## 2023-01-09 NOTE — ED Provider Notes (Signed)
[place] EMERGENCY DEPARTMENT AT [facility] Provider Note   CSN: [id number] Arrival date & time: 01/09/23  2111     History  Chief Complaint  Patient presents with   Abdominal Pain    Gabriela Sullivan is a 32 y.o. female with past medical history significant for breast cancer, migraines, PCOS, asthma presents to the ED complaining of abdominal pain. Patient was seen over the weekend for nausea and vomiting.  On Tuesday, she developed abdominal pain.  She describes it as burning and radiates into her left flank.  She has had intermittent fevers at home.  Patient is currently undergoing chemotherapy for her breast cancer and saw her oncologist on Monday.  Patient states when she saw her oncologist she was feeling better, but the abdominal pain began suddenly the next day.  She reports she has decreased appetite and only vomits when she attempts to eat.  She reports she did have diarrhea on Tuesday and Wednesday, but this has resolved.  She reports that she has had subjective fevers at home.  She reports that at times her abdominal pain causes a heaviness in her chest and slight shortness of breath.  Denies chest pain, cough, melena, nausea, dizziness, lightheadedness, syncope.  She reports that she has had no recent changes in her medications or chemotherapeutics.       Home Medications Prior to Admission medications   Medication Sig Start Date End Date Taking? Authorizing Provider  abemaciclib (VERZENIO) 100 MG tablet Take 1 tablet (100 mg total) by mouth 2 (two) times daily. Swallow tablets whole. Do not chew, crush, or split tablets before swallowing. 10/08/22   [name], MD  anastrozole (ARIMIDEX) 1 MG tablet Take 1 tablet (1 mg total) by mouth daily. 04/09/22   [name], MD  calcium carbonate (TUMS - DOSED IN MG ELEMENTAL CALCIUM) 500 MG chewable tablet Chew 2 tablets by mouth every other day.    [provider]  MOUNJARO 5 MG/0.5ML Pen Inject 5 mg into the skin  once a week. 11/21/22   [provider]  omeprazole (PRILOSEC) 20 MG capsule Take 1 capsule (20 mg total) by mouth daily. Patient not taking: Reported on 01/05/2023 08/04/21   Antony Madura, PA-C  ondansetron (ZOFRAN-ODT) 4 MG disintegrating tablet Take 1 tablet (4 mg total) by mouth every 8 (eight) hours as needed for nausea or vomiting. Patient not taking: Reported on 01/05/2023 10/26/21   Benjiman Core, MD  potassium chloride (KLOR-CON) 10 MEQ tablet Take 1 tablet (10 mEq total) by mouth daily. 01/05/23   Prosperi, Christian H, PA-C  potassium chloride SA (KLOR-CON M) 20 MEQ tablet Take 1 tablet (20 mEq total) by mouth 2 (two) times daily. Patient not taking: Reported on 01/05/2023 07/24/21   [name], MD  traMADol (ULTRAM) 50 MG tablet Take 1 tablet (50 mg total) by mouth every 6 (six) hours as needed for up to 10 days. For severe pain Patient not taking: Reported on 01/05/2023 01/01/23 01/11/23  Shanon Ace, PA-C  Vitamin D, Ergocalciferol, (DRISDOL) 1.25 MG (50000 UNIT) CAPS capsule Take 50,000 Units by mouth once a week. 06/20/22   [provider]  prochlorperazine (COMPAZINE) 10 MG tablet Take 1 tablet (10 mg total) by mouth every 6 (six) hours as needed (Nausea or vomiting). 10/04/20 12/21/20  Magrinat, Valentino Hue, MD      Allergies    Patient has no known allergies.    Review of Systems   Review of Systems  Constitutional:  Positive  for chills and fever.  Respiratory:  Positive for shortness of breath. Negative for cough.   Cardiovascular:  Negative for chest pain.  Gastrointestinal:  Positive for abdominal pain, diarrhea and vomiting. Negative for blood in stool and nausea.  Neurological:  Negative for dizziness, syncope and light-headedness.    Physical Exam Updated Vital Signs BP 102/69   Pulse 88   Temp 98.4 F (36.9 C) (Oral)   Resp 19   SpO2 95%  Physical Exam Vitals and nursing note reviewed.  Constitutional:      General: She is not in acute  distress.    Appearance: She is not ill-appearing.  HENT:     Mouth/Throat:     Mouth: Mucous membranes are moist.     Pharynx: Oropharynx is clear.  Cardiovascular:     Rate and Rhythm: Regular rhythm. Tachycardia present.     Pulses: Normal pulses.     Heart sounds: Normal heart sounds.  Pulmonary:     Effort: Pulmonary effort is normal. No respiratory distress.     Breath sounds: Normal breath sounds and air entry.  Abdominal:     General: Abdomen is flat. Bowel sounds are normal. There is no distension.     Palpations: Abdomen is soft.     Tenderness: There is abdominal tenderness in the epigastric area. There is no guarding. Negative signs include Murphy's sign.  Skin:    General: Skin is warm and dry.     Capillary Refill: Capillary refill takes less than 2 seconds.  Neurological:     Mental Status: She is alert. Mental status is at baseline.  Psychiatric:        Mood and Affect: Mood normal.        Behavior: Behavior normal.     ED Results / Procedures / Treatments   Labs (all labs ordered are listed, but only abnormal results are displayed) Labs Reviewed  COMPREHENSIVE METABOLIC PANEL - Abnormal; Notable for the following components:      Result Value   Sodium 131 (*)    Potassium 3.2 (*)    Calcium 8.1 (*)    AST 64 (*)    ALT 50 (*)    Total Bilirubin 1.3 (*)    All other components within normal limits  CBC WITH DIFFERENTIAL/PLATELET - Abnormal; Notable for the following components:   WBC 3.0 (*)    RBC 2.58 (*)    Hemoglobin 8.8 (*)    HCT 25.0 (*)    MCH 34.1 (*)    Platelets 137 (*)    All other components within normal limits  PROTIME-INR - Abnormal; Notable for the following components:   Prothrombin Time 15.8 (*)    All other components within normal limits  URINALYSIS, W/ REFLEX TO CULTURE (INFECTION SUSPECTED) - Abnormal; Notable for the following components:   Color, Urine AMBER (*)    APPearance HAZY (*)    Ketones, ur 20 (*)    All other  components within normal limits  LIPASE, BLOOD - Abnormal; Notable for the following components:   Lipase 72 (*)    All other components within normal limits  CULTURE, BLOOD (ROUTINE X 2)  CULTURE, BLOOD (ROUTINE X 2)  APTT  HCG, SERUM, QUALITATIVE  MAGNESIUM  I-STAT CG4 LACTIC ACID, ED    EKG None  Radiology CT Angio Chest PE W/Cm &/Or Wo Cm  Result Date: 01/10/2023 CLINICAL DATA:  Right breast cancer, status post bilateral mastectomy. Fever and flank pain. Evaluate for PE. EXAM:  CT ANGIOGRAPHY CHEST WITH CONTRAST TECHNIQUE: Multidetector CT imaging of the chest was performed using the standard protocol during bolus administration of intravenous contrast. Multiplanar CT image reconstructions and MIPs were obtained to evaluate the vascular anatomy. RADIATION DOSE REDUCTION: This exam was performed according to the departmental dose-optimization program which includes automated exposure control, adjustment of the mA and/or kV according to patient size and/or use of iterative reconstruction technique. CONTRAST:  75mL OMNIPAQUE IOHEXOL 350 MG/ML SOLN COMPARISON:  CT chest dated 12/31/2022. FINDINGS: Cardiovascular: Satisfactory opacification of the bilateral pulmonary arteries to the lobar level. No evidence of pulmonary embolism. Although not tailored for evaluation of the thoracic aorta, there is no evidence of thoracic aortic aneurysm or dissection. Heart is normal in size.  No pericardial effusion. Right chest port terminates in the upper right atrium. Mediastinum/Nodes: No suspicious mediastinal lymphadenopathy. Status post left axillary lymph node dissection with residual 7 mm short axis left axillary node (series 4/image 44), suspicious. Lungs/Pleura: Radiation changes in the anterior left upper lobe. No suspicious pulmonary nodules. No focal consolidation. No pleural effusion or pneumothorax. Upper Abdomen: Visualized upper abdomen is grossly unremarkable. Musculoskeletal: Status post bilateral  mastectomy. Visualized osseous structures are within normal limits. Review of the MIP images confirms the above findings. IMPRESSION: No evidence of pulmonary embolism. Status post bilateral mastectomy with left axillary lymph node dissection. Radiation changes in the anterior left upper lobe. Residual 7 mm short axis left axillary node, unchanged from recent CT, suspicious for nodal metastasis. Electronically Signed   By: Charline Bills M.D.   On: 01/10/2023 03:35   DG Chest Port 1 View  Result Date: 01/09/2023 CLINICAL DATA:  Questionable sepsis EXAM: PORTABLE CHEST 1 VIEW COMPARISON:  Chest x-ray 620 1,002 FINDINGS: Right chest port catheter tip ends in the SVC. The heart size and mediastinal contours are within normal limits. Both lungs are clear. The visualized skeletal structures are unremarkable. IMPRESSION: No active disease. Electronically Signed   By: Darliss Cheney M.D.   On: 01/09/2023 21:59    Procedures Procedures    Medications Ordered in ED Medications  acetaminophen (TYLENOL) tablet 1,000 mg (1,000 mg Oral Given 01/09/23 2245)  lactated ringers bolus 1,000 mL (1,000 mLs Intravenous Bolus 01/09/23 2245)  potassium chloride 10 mEq in 100 mL IVPB (10 mEq Intravenous New Bag/Given 01/10/23 0028)  piperacillin-tazobactam (ZOSYN) IVPB 3.375 g (0 g Intravenous Stopped 01/10/23 0118)  iohexol (OMNIPAQUE) 350 MG/ML injection 75 mL (75 mLs Intravenous Contrast Given 01/10/23 0257)    ED Course/ Medical Decision Making/ A&P                                 Medical Decision Making Amount and/or Complexity of Data Reviewed Labs: ordered. Radiology: ordered.  Risk OTC drugs. Prescription drug management. Decision regarding hospitalization.   This patient presents to the ED with chief complaint(s) of abdominal pain, vomiting, diarrhea with pertinent past medical history of breast cancer currently undergoing chemotherapy treatment.  The complaint involves an extensive differential diagnosis  and also carries with it a high risk of complications and morbidity.    The differential diagnosis includes pancreatitis, colitis, diverticulitis, gastroenteritis, complication of metastatic breast cancer   The initial plan is to obtain infection workup  Additional history obtained: Records reviewed  - patient saw oncology on 01/06/23.  No changes in cancer treatment.  Patient was seen over the weekend in ED for nausea and vomiting.  CT scans from  this visit did not demonstrate acute finding to explain patient's symptoms.   Initial Assessment:   On exam, patient is resting comfortably in bed and does not appear to be in acute distress.  She does appear mildly uncomfortable.  Epigastric tenderness on palpation.  Abdomen is soft, non distended, with no overlying skin changes.  Negative Murphy's sign.  Lungs clear to auscultation bilaterally.  Heart rate elevated around 110 with sinus rhythm on the monitor.  Skin warm and dry.  Patient febrile at 103.17F.  Independent ECG/labs interpretation:  The following labs were independently interpreted:  CBC with leukopenia and anemia.  Leukopenia slightly improved.  Hgb down from 10 days ago, 12.9 -> 8.8 today.    Independent visualization and interpretation of imaging: I independently visualized the following imaging with scope of interpretation limited to determining acute life threatening conditions related to emergency care: chest x-ray, which revealed no evidence of acute cardiopulmonary disease.   CT PE study was obtained and is negative for PE.  There are radiation changes in the anterior left upper lobe and residual short axis left axillary node, unchanged from recent CT, suspicious for nodal metastasis.  Treatment and Reassessment: Patient given Tylenol and IV fluids with improvement in fever.  Repeat temp 98.78F.  Zosyn given for fever of unknown origin.  Potassium given    I feel that patient would benefit from hospital admission.  Patient  verbalizes her agreement with plan.  Consultations obtained:   I requested consultation with on-call hospitalist and spoke with Dr. Tonye Royalty who agreed with hospital admission.    Disposition:   Patient will be admitted to [facility] for fever, abdominal pain and vomiting.            Final Clinical Impression(s) / ED Diagnoses Final diagnoses:  Fever of unknown origin  Carcinoma of breast metastatic to bone, unspecified laterality [facility])  Epigastric pain    Rx / DC Orders ED Discharge Orders     None         Lenard Simmer, PA-C 01/10/23 0432    Tilden Fossa, MD 01/11/23 [phone]

## 2023-01-09 NOTE — ED Notes (Signed)
RN made aware of pt temp 103.1

## 2023-01-09 NOTE — ED Provider Notes (Incomplete)
[place] EMERGENCY DEPARTMENT AT [facility] Provider Note   CSN: [id number] Arrival date & time: 01/09/23  2111     History {Add pertinent medical, surgical, social history, OB history to HPI:1} Chief Complaint  Patient presents with  . Abdominal Pain    Gabriela Sullivan is a 32 y.o. female with past medical history significant for breast cancer, migraines, PCOS, asthma presents to the ED complaining of abdominal pain. Patient was seen over the weekend for nausea and vomiting.  On Tuesday, she developed abdominal pain.  She describes it as burning and radiates into her left flank.  She has had intermittent fevers at home.  Patient is currently undergoing chemotherapy for her breast cancer and saw her oncologist on Monday.         Home Medications Prior to Admission medications   Medication Sig Start Date End Date Taking? Authorizing Provider  abemaciclib (VERZENIO) 100 MG tablet Take 1 tablet (100 mg total) by mouth 2 (two) times daily. Swallow tablets whole. Do not chew, crush, or split tablets before swallowing. 10/08/22   [name], MD  anastrozole (ARIMIDEX) 1 MG tablet Take 1 tablet (1 mg total) by mouth daily. 04/09/22   [name], MD  calcium carbonate (TUMS - DOSED IN MG ELEMENTAL CALCIUM) 500 MG chewable tablet Chew 2 tablets by mouth every other day.    [provider]  MOUNJARO 5 MG/0.5ML Pen Inject 5 mg into the skin once a week. 11/21/22   [provider]  omeprazole (PRILOSEC) 20 MG capsule Take 1 capsule (20 mg total) by mouth daily. Patient not taking: Reported on 01/05/2023 08/04/21   Antony Madura, PA-C  ondansetron (ZOFRAN-ODT) 4 MG disintegrating tablet Take 1 tablet (4 mg total) by mouth every 8 (eight) hours as needed for nausea or vomiting. Patient not taking: Reported on 01/05/2023 10/26/21   Benjiman Core, MD  potassium chloride (KLOR-CON) 10 MEQ tablet Take 1 tablet (10 mEq total) by mouth daily. 01/05/23   Prosperi, Christian H, PA-C   potassium chloride SA (KLOR-CON M) 20 MEQ tablet Take 1 tablet (20 mEq total) by mouth 2 (two) times daily. Patient not taking: Reported on 01/05/2023 07/24/21   [name], MD  traMADol (ULTRAM) 50 MG tablet Take 1 tablet (50 mg total) by mouth every 6 (six) hours as needed for up to 10 days. For severe pain Patient not taking: Reported on 01/05/2023 01/01/23 01/11/23  Shanon Ace, PA-C  Vitamin D, Ergocalciferol, (DRISDOL) 1.25 MG (50000 UNIT) CAPS capsule Take 50,000 Units by mouth once a week. 06/20/22   [provider]  prochlorperazine (COMPAZINE) 10 MG tablet Take 1 tablet (10 mg total) by mouth every 6 (six) hours as needed (Nausea or vomiting). 10/04/20 12/21/20  Magrinat, Valentino Hue, MD      Allergies    Patient has no known allergies.    Review of Systems   Review of Systems  Gastrointestinal:  Positive for abdominal pain.    Physical Exam Updated Vital Signs BP 124/74 (BP Location: Right Arm)   Pulse (!) 117   Temp (!) 103.1 F (39.5 C) (Oral)   Resp 14   SpO2 100%  Physical Exam  ED Results / Procedures / Treatments   Labs (all labs ordered are listed, but only abnormal results are displayed) Labs Reviewed  CULTURE, BLOOD (ROUTINE X 2)  CULTURE, BLOOD (ROUTINE X 2)  COMPREHENSIVE METABOLIC PANEL  CBC WITH DIFFERENTIAL/PLATELET  PROTIME-INR  APTT  HCG, SERUM, QUALITATIVE  URINALYSIS, W/  REFLEX TO CULTURE (INFECTION SUSPECTED)  I-STAT CG4 LACTIC ACID, ED    EKG None  Radiology DG Chest Port 1 View  Result Date: 01/09/2023 CLINICAL DATA:  Questionable sepsis EXAM: PORTABLE CHEST 1 VIEW COMPARISON:  Chest x-ray 620 1,002 FINDINGS: Right chest port catheter tip ends in the SVC. The heart size and mediastinal contours are within normal limits. Both lungs are clear. The visualized skeletal structures are unremarkable. IMPRESSION: No active disease. Electronically Signed   By: Darliss Cheney M.D.   On: 01/09/2023 21:59    Procedures Procedures   {Document cardiac monitor, telemetry assessment procedure when appropriate:1}  Medications Ordered in ED Medications  acetaminophen (TYLENOL) tablet 1,000 mg (has no administration in time range)  lactated ringers bolus 1,000 mL (has no administration in time range)    ED Course/ Medical Decision Making/ A&P   {   Click here for ABCD2, HEART and other calculatorsREFRESH Note before signing :1}                              Medical Decision Making Risk OTC drugs.   ***  {Document critical care time when appropriate:1} {Document review of labs and clinical decision tools ie heart score, Chads2Vasc2 etc:1}  {Document your independent review of radiology images, and any outside records:1} {Document your discussion with family members, caretakers, and with consultants:1} {Document social determinants of health affecting pt's care:1} {Document your decision making why or why not admission, treatments were needed:1} Final Clinical Impression(s) / ED Diagnoses Final diagnoses:  None    Rx / DC Orders ED Discharge Orders     None

## 2023-01-10 ENCOUNTER — Emergency Department (HOSPITAL_COMMUNITY): Payer: Medicaid Other

## 2023-01-10 ENCOUNTER — Inpatient Hospital Stay: Payer: Medicaid Other

## 2023-01-10 ENCOUNTER — Encounter (HOSPITAL_COMMUNITY): Payer: Self-pay

## 2023-01-10 ENCOUNTER — Inpatient Hospital Stay: Payer: Medicaid Other | Admitting: Hematology and Oncology

## 2023-01-10 DIAGNOSIS — C7951 Secondary malignant neoplasm of bone: Secondary | ICD-10-CM | POA: Diagnosis present

## 2023-01-10 DIAGNOSIS — Z8041 Family history of malignant neoplasm of ovary: Secondary | ICD-10-CM | POA: Diagnosis not present

## 2023-01-10 DIAGNOSIS — Z7985 Long-term (current) use of injectable non-insulin antidiabetic drugs: Secondary | ICD-10-CM | POA: Diagnosis not present

## 2023-01-10 DIAGNOSIS — R112 Nausea with vomiting, unspecified: Secondary | ICD-10-CM | POA: Diagnosis present

## 2023-01-10 DIAGNOSIS — R5081 Fever presenting with conditions classified elsewhere: Secondary | ICD-10-CM | POA: Diagnosis present

## 2023-01-10 DIAGNOSIS — T451X5A Adverse effect of antineoplastic and immunosuppressive drugs, initial encounter: Secondary | ICD-10-CM | POA: Diagnosis present

## 2023-01-10 DIAGNOSIS — Z803 Family history of malignant neoplasm of breast: Secondary | ICD-10-CM | POA: Diagnosis not present

## 2023-01-10 DIAGNOSIS — R7401 Elevation of levels of liver transaminase levels: Secondary | ICD-10-CM | POA: Insufficient documentation

## 2023-01-10 DIAGNOSIS — D709 Neutropenia, unspecified: Secondary | ICD-10-CM | POA: Diagnosis not present

## 2023-01-10 DIAGNOSIS — C50412 Malignant neoplasm of upper-outer quadrant of left female breast: Secondary | ICD-10-CM | POA: Diagnosis present

## 2023-01-10 DIAGNOSIS — E119 Type 2 diabetes mellitus without complications: Secondary | ICD-10-CM | POA: Diagnosis present

## 2023-01-10 DIAGNOSIS — Z800 Family history of malignant neoplasm of digestive organs: Secondary | ICD-10-CM | POA: Diagnosis not present

## 2023-01-10 DIAGNOSIS — A419 Sepsis, unspecified organism: Secondary | ICD-10-CM | POA: Diagnosis present

## 2023-01-10 DIAGNOSIS — E669 Obesity, unspecified: Secondary | ICD-10-CM | POA: Diagnosis present

## 2023-01-10 DIAGNOSIS — K7581 Nonalcoholic steatohepatitis (NASH): Secondary | ICD-10-CM | POA: Diagnosis present

## 2023-01-10 DIAGNOSIS — C773 Secondary and unspecified malignant neoplasm of axilla and upper limb lymph nodes: Secondary | ICD-10-CM | POA: Diagnosis present

## 2023-01-10 DIAGNOSIS — D6481 Anemia due to antineoplastic chemotherapy: Secondary | ICD-10-CM | POA: Diagnosis present

## 2023-01-10 DIAGNOSIS — D849 Immunodeficiency, unspecified: Secondary | ICD-10-CM | POA: Diagnosis present

## 2023-01-10 DIAGNOSIS — D61818 Other pancytopenia: Secondary | ICD-10-CM | POA: Diagnosis present

## 2023-01-10 DIAGNOSIS — E876 Hypokalemia: Secondary | ICD-10-CM | POA: Diagnosis present

## 2023-01-10 DIAGNOSIS — R509 Fever, unspecified: Secondary | ICD-10-CM | POA: Diagnosis present

## 2023-01-10 DIAGNOSIS — R748 Abnormal levels of other serum enzymes: Secondary | ICD-10-CM | POA: Diagnosis present

## 2023-01-10 DIAGNOSIS — E282 Polycystic ovarian syndrome: Secondary | ICD-10-CM | POA: Diagnosis present

## 2023-01-10 DIAGNOSIS — Z79811 Long term (current) use of aromatase inhibitors: Secondary | ICD-10-CM | POA: Diagnosis not present

## 2023-01-10 DIAGNOSIS — Z170 Estrogen receptor positive status [ER+]: Secondary | ICD-10-CM | POA: Diagnosis not present

## 2023-01-10 DIAGNOSIS — Z9013 Acquired absence of bilateral breasts and nipples: Secondary | ICD-10-CM | POA: Diagnosis not present

## 2023-01-10 DIAGNOSIS — Z8042 Family history of malignant neoplasm of prostate: Secondary | ICD-10-CM | POA: Diagnosis not present

## 2023-01-10 DIAGNOSIS — E871 Hypo-osmolality and hyponatremia: Secondary | ICD-10-CM | POA: Diagnosis present

## 2023-01-10 LAB — HEMOGLOBIN A1C
Hgb A1c MFr Bld: 4.1 % — ABNORMAL LOW (ref 4.8–5.6)
Mean Plasma Glucose: 70.97 mg/dL

## 2023-01-10 LAB — CBC
HCT: 25.8 % — ABNORMAL LOW (ref 36.0–46.0)
Hemoglobin: 9 g/dL — ABNORMAL LOW (ref 12.0–15.0)
MCH: 34.2 pg — ABNORMAL HIGH (ref 26.0–34.0)
MCHC: 34.9 g/dL (ref 30.0–36.0)
MCV: 98.1 fL (ref 80.0–100.0)
Platelets: 106 10*3/uL — ABNORMAL LOW (ref 150–400)
RBC: 2.63 MIL/uL — ABNORMAL LOW (ref 3.87–5.11)
RDW: 14.3 % (ref 11.5–15.5)
WBC: 2.4 10*3/uL — ABNORMAL LOW (ref 4.0–10.5)
nRBC: 0 % (ref 0.0–0.2)

## 2023-01-10 LAB — LIPASE, BLOOD: Lipase: 72 U/L — ABNORMAL HIGH (ref 11–51)

## 2023-01-10 LAB — HIV ANTIBODY (ROUTINE TESTING W REFLEX): HIV Screen 4th Generation wRfx: NONREACTIVE

## 2023-01-10 LAB — CREATININE, SERUM
Creatinine, Ser: 0.63 mg/dL (ref 0.44–1.00)
GFR, Estimated: >60 mL/min (ref >60)

## 2023-01-10 LAB — GLUCOSE, CAPILLARY: Glucose-Capillary: 109 mg/dL — ABNORMAL HIGH (ref 70–99)

## 2023-01-10 LAB — CBG MONITORING, ED: Glucose-Capillary: 93 mg/dL (ref 70–99)

## 2023-01-10 MED ORDER — POTASSIUM CHLORIDE ER 10 MEQ PO TBCR
10.0000 meq | EXTENDED_RELEASE_TABLET | Freq: Every day | ORAL | Status: DC
  Filled 2023-01-10: qty 1

## 2023-01-10 MED ORDER — HYDRALAZINE HCL 20 MG/ML IJ SOLN: 5.0000 mg | Freq: Three times a day (TID) | INTRAMUSCULAR | Status: DC | PRN

## 2023-01-10 MED ORDER — TRAZODONE HCL 50 MG PO TABS: 25.0000 mg | ORAL_TABLET | Freq: Every evening | ORAL | Status: DC | PRN

## 2023-01-10 MED ORDER — ONDANSETRON HCL 4 MG/2ML IJ SOLN
4.0000 mg | Freq: Four times a day (QID) | INTRAMUSCULAR | Status: DC | PRN
  Administered 2023-01-10: 4 mg via INTRAVENOUS
  Filled 2023-01-10: qty 2

## 2023-01-10 MED ORDER — HYDROCODONE-ACETAMINOPHEN 5-325 MG PO TABS: 1.0000 | ORAL_TABLET | Freq: Four times a day (QID) | ORAL | Status: DC | PRN

## 2023-01-10 MED ORDER — SODIUM CHLORIDE (PF) 0.9 % IJ SOLN
INTRAMUSCULAR | Status: AC
  Filled 2023-01-10: qty 50

## 2023-01-10 MED ORDER — SODIUM CHLORIDE 0.9 % IV SOLN: 2.0000 g | Freq: Once | INTRAVENOUS | Status: DC

## 2023-01-10 MED ORDER — PIPERACILLIN-TAZOBACTAM 3.375 G IVPB: 3.3750 g | Freq: Three times a day (TID) | INTRAVENOUS | Status: DC

## 2023-01-10 MED ORDER — ENOXAPARIN SODIUM 40 MG/0.4ML IJ SOSY: 40.0000 mg | PREFILLED_SYRINGE | INTRAMUSCULAR | Status: DC

## 2023-01-10 MED ORDER — VANCOMYCIN HCL 1250 MG/250ML IV SOLN: 1250.0000 mg | INTRAVENOUS | Status: DC

## 2023-01-10 MED ORDER — SODIUM CHLORIDE 0.9 % IV SOLN
2.0000 g | Freq: Three times a day (TID) | INTRAVENOUS | Status: DC
  Administered 2023-01-10 – 2023-01-13 (×11): 2 g via INTRAVENOUS
  Filled 2023-01-10 (×11): qty 12.5

## 2023-01-10 MED ORDER — HYDROCODONE-ACETAMINOPHEN 5-325 MG PO TABS: 1.0000 | ORAL_TABLET | ORAL | Status: DC | PRN

## 2023-01-10 MED ORDER — BISACODYL 5 MG PO TBEC: 5.0000 mg | DELAYED_RELEASE_TABLET | Freq: Every day | ORAL | Status: DC | PRN

## 2023-01-10 MED ORDER — POTASSIUM CHLORIDE CRYS ER 20 MEQ PO TBCR
40.0000 meq | EXTENDED_RELEASE_TABLET | Freq: Once | ORAL | Status: AC
  Administered 2023-01-10: 40 meq via ORAL
  Filled 2023-01-10: qty 2

## 2023-01-10 MED ORDER — ALBUTEROL SULFATE (2.5 MG/3ML) 0.083% IN NEBU: 2.5000 mg | INHALATION_SOLUTION | Freq: Four times a day (QID) | RESPIRATORY_TRACT | Status: DC | PRN

## 2023-01-10 MED ORDER — ENOXAPARIN SODIUM 40 MG/0.4ML IJ SOSY
40.0000 mg | PREFILLED_SYRINGE | INTRAMUSCULAR | Status: DC
  Administered 2023-01-10: 40 mg via SUBCUTANEOUS
  Filled 2023-01-10: qty 0.4

## 2023-01-10 MED ORDER — IOHEXOL 350 MG/ML SOLN
75.0000 mL | Freq: Once | INTRAVENOUS | Status: AC | PRN
  Administered 2023-01-10: 75 mL via INTRAVENOUS

## 2023-01-10 MED ORDER — IPRATROPIUM BROMIDE 0.02 % IN SOLN: 0.5000 mg | Freq: Four times a day (QID) | RESPIRATORY_TRACT | Status: DC | PRN

## 2023-01-10 MED ORDER — ACETAMINOPHEN 500 MG PO TABS
1000.0000 mg | ORAL_TABLET | Freq: Four times a day (QID) | ORAL | Status: DC | PRN
  Administered 2023-01-10: 1000 mg via ORAL
  Filled 2023-01-10: qty 2

## 2023-01-10 MED ORDER — ANASTROZOLE 1 MG PO TABS
1.0000 mg | ORAL_TABLET | Freq: Every day | ORAL | Status: DC
  Administered 2023-01-10 – 2023-01-13 (×4): 1 mg via ORAL
  Filled 2023-01-10 (×4): qty 1

## 2023-01-10 MED ORDER — INSULIN ASPART 100 UNIT/ML IJ SOLN
0.0000 [IU] | Freq: Three times a day (TID) | INTRAMUSCULAR | Status: DC
  Administered 2023-01-12 (×2): 2 [IU] via SUBCUTANEOUS
  Filled 2023-01-10: qty 0.15

## 2023-01-10 MED ORDER — MORPHINE SULFATE (PF) 2 MG/ML IV SOLN
1.0000 mg | Freq: Four times a day (QID) | INTRAVENOUS | Status: DC | PRN
  Filled 2023-01-10: qty 1

## 2023-01-10 MED ORDER — LACTATED RINGERS IV SOLN
150.0000 mL/h | INTRAVENOUS | Status: AC
  Administered 2023-01-10 (×3): 150 mL/h via INTRAVENOUS

## 2023-01-10 MED ORDER — ACETAMINOPHEN 325 MG PO TABS
650.0000 mg | ORAL_TABLET | Freq: Four times a day (QID) | ORAL | Status: DC | PRN
  Administered 2023-01-10 – 2023-01-12 (×5): 650 mg via ORAL
  Filled 2023-01-10 (×5): qty 2

## 2023-01-10 MED ORDER — VITAMIN D (ERGOCALCIFEROL) 1.25 MG (50000 UNIT) PO CAPS
50000.0000 [IU] | ORAL_CAPSULE | ORAL | Status: DC
  Administered 2023-01-12: 50000 [IU] via ORAL
  Filled 2023-01-10: qty 1

## 2023-01-10 MED ORDER — IPRATROPIUM-ALBUTEROL 0.5-2.5 (3) MG/3ML IN SOLN: 3.0000 mL | Freq: Four times a day (QID) | RESPIRATORY_TRACT | Status: DC | PRN

## 2023-01-10 MED ORDER — POTASSIUM CHLORIDE ER 10 MEQ PO TBCR
10.0000 meq | EXTENDED_RELEASE_TABLET | Freq: Every day | ORAL | Status: DC
  Administered 2023-01-11 – 2023-01-12 (×2): 10 meq via ORAL
  Filled 2023-01-10 (×5): qty 1

## 2023-01-10 MED ORDER — PANTOPRAZOLE SODIUM 40 MG IV SOLR
40.0000 mg | INTRAVENOUS | Status: DC
  Administered 2023-01-10 – 2023-01-11 (×2): 40 mg via INTRAVENOUS
  Filled 2023-01-10 (×2): qty 10

## 2023-01-10 MED ORDER — SENNOSIDES-DOCUSATE SODIUM 8.6-50 MG PO TABS: 1.0000 | ORAL_TABLET | Freq: Every evening | ORAL | Status: DC | PRN

## 2023-01-10 MED ORDER — PIPERACILLIN-TAZOBACTAM 3.375 G IVPB 30 MIN
3.3750 g | Freq: Once | INTRAVENOUS | Status: AC
  Administered 2023-01-10: 3.375 g via INTRAVENOUS
  Filled 2023-01-10: qty 50

## 2023-01-10 MED ORDER — MORPHINE SULFATE (PF) 2 MG/ML IV SOLN: 1.0000 mg | INTRAVENOUS | Status: DC | PRN

## 2023-01-10 MED ORDER — ABEMACICLIB 100 MG PO TABS: 100.0000 mg | ORAL_TABLET | Freq: Two times a day (BID) | ORAL | Status: DC

## 2023-01-10 MED ORDER — VANCOMYCIN HCL IN DEXTROSE 1-5 GM/200ML-% IV SOLN
1000.0000 mg | Freq: Once | INTRAVENOUS | Status: AC
  Administered 2023-01-10: 1000 mg via INTRAVENOUS
  Filled 2023-01-10: qty 200

## 2023-01-10 MED ORDER — ACETAMINOPHEN 650 MG RE SUPP: 650.0000 mg | Freq: Four times a day (QID) | RECTAL | Status: DC | PRN

## 2023-01-10 MED ORDER — METRONIDAZOLE 500 MG/100ML IV SOLN
500.0000 mg | Freq: Two times a day (BID) | INTRAVENOUS | Status: DC
  Administered 2023-01-10 – 2023-01-13 (×7): 500 mg via INTRAVENOUS
  Filled 2023-01-10 (×7): qty 100

## 2023-01-10 MED ORDER — CALCIUM CARBONATE ANTACID 500 MG PO CHEW
2.0000 | CHEWABLE_TABLET | ORAL | Status: DC
  Administered 2023-01-10 – 2023-01-12 (×2): 400 mg via ORAL
  Filled 2023-01-10 (×2): qty 2

## 2023-01-10 MED ORDER — SODIUM CHLORIDE 0.9% FLUSH
3.0000 mL | Freq: Two times a day (BID) | INTRAVENOUS | Status: DC
  Administered 2023-01-10 – 2023-01-13 (×4): 3 mL via INTRAVENOUS

## 2023-01-10 MED ORDER — ONDANSETRON HCL 4 MG PO TABS: 4.0000 mg | ORAL_TABLET | Freq: Four times a day (QID) | ORAL | Status: DC | PRN

## 2023-01-10 NOTE — H&P (Signed)
History and Physical    Patient: Gabriela Sullivan NWG:[id number] DOB: 1990-12-16 DOA: 01/09/2023 DOS: the patient was seen and examined on 01/10/2023 PCP: Leane Call, MD  Patient coming from: Home  Chief Complaint:  Chief Complaint  Patient presents with   Abdominal Pain   HPI: Gabriela Sullivan is a 32 y.o. female with medical history significant of remote asthma,  hepatic asteatosis, migraine headaches, prediabetes, breast cancer, status post bilateral mastectomy, radiation therapy, 2 cycles of Vibramycin and Cytoxan given but discontinued due to abnormal LFTs currently on abemaciclib and anastrozole who has been having abdominal pain, nausea, multiple episodes of postprandial vomiting for almost 2 weeks, was seen in the ED 5 days ago, felt well the next day, but has been having more nausea, emesis episodes and now with multiple episodes of diarrhea since Tuesday.  She has also been having fevers.  No travel history or sick contacts.  No constipation, melena or hematochezia.  No flank pain, dysuria, frequency or hematuria.  She denied fever, chills, rhinorrhea, sore throat, wheezing or hemoptysis, but has had a mild dry cough with occasional pleuritic chest pain.  No palpitations, diaphoresis, PND, orthopnea or pitting edema of the lower extremities.   No polyuria, polydipsia, polyphagia or blurred vision.   Lab work: Urine analysis was amber in color, hazy in appearance with ketones of 20 mg/dL.  CBC showed a white count of 3.0 with 58% neutrophils, hemoglobin 8.8 g/dL platelets 578.  PT 15.8, INR 1.2 and PTT 33.  Lipase was 72 units/L.  Serum pregnancy test negative.  Magnesium is 2.3 mg/dL.  Lactic acid x 2 was normal.  Imaging: Portable 1 view chest radiograph with no active disease.  Heart size of mediastinal contours normal.  Chest port catheter tip ends in the SVC.  CT abdomen/pelvis with contrast from 5 days ago with no acute findings in the abdomen or pelvis.  There were multiple scattered lucent  lesions within T11, L2 and L5 which are stable.  Mixed lytic and sclerotic sacral region are stable since last study.  CT head from 5 days ago with no acute intracranial process.  There was mild to moderate inferior right maxillary sinus mucosal polyp, which is grossly similar to previous study.  CTA chest done today with no evidence of PE.  Status post bilateral mastectomy with left axillary lymph node dissection.  Radiation changes in the anterior left upper lobe.  There was a residual 7 mm short axis left axillary node, unchanged from recent CT, suspicious for nodal metastasis.  ED course: Initial vital signs were temperature 100.5 F, pulse 117, respiration 14, BP 124/74 mmHg O2 sat 100% on room air.  The patient received 1000 mL of LR bolus, cefepime, metronidazole, vancomycin, KCl 40 mEq p.o. and KCl 10 mEq IVPB.   Review of Systems: As mentioned in the history of present illness. All other systems reviewed and are negative.  Past Medical History:  Diagnosis Date   Asthma    remote   Breast cancer (HCC)    Elevated liver enzymes    Family history of breast cancer    Family history of ovarian cancer    Family history of prostate cancer    Family history of stomach cancer    Hepatic steatosis    Hyperglycemia    A1c 7.6% 12/19/20   Migraines    PCOS (polycystic ovarian syndrome)    Pre-diabetes    Past Surgical History:  Procedure Laterality Date   MASTECTOMY W/ SENTINEL NODE BIOPSY  Right 01/31/2021   Procedure: RIGHT SIMPLE MASTECTOMY;  Surgeon: Harriette Bouillon, MD;  Location: [facility] OR;  Service: General;  Laterality: Right;   MODIFIED MASTECTOMY Left 01/31/2021   Procedure: LEFT MODIFIED RADICAL MASTECTOMY;  Surgeon: Harriette Bouillon, MD;  Location: [facility] OR;  Service: General;  Laterality: Left;   NO PAST SURGERIES     PORTACATH PLACEMENT Right 10/18/2020   Procedure: INSERTION PORT-A-CATH;  Surgeon: Harriette Bouillon, MD;  Location: [facility];  Service: General;  Laterality:  Right;   Social History:  reports that she has never smoked. She has never used smokeless tobacco. She reports that she does not drink alcohol and does not use drugs.  No Known Allergies  Family History  Problem Relation Age of Onset   Ovarian cancer Maternal Grandmother        unknown age of diagnosis   Prostate cancer Maternal Grandfather        metastatic   Breast cancer Cousin        unknown age of diagnosis, maternal first cousin   Stomach cancer Cousin        dx 20s/30s, maternal first cousin    Prior to Admission medications   Medication Sig Start Date End Date Taking? Authorizing Provider  abemaciclib (VERZENIO) 100 MG tablet Take 1 tablet (100 mg total) by mouth 2 (two) times daily. Swallow tablets whole. Do not chew, crush, or split tablets before swallowing. 10/08/22   [name], MD  anastrozole (ARIMIDEX) 1 MG tablet Take 1 tablet (1 mg total) by mouth daily. 04/09/22   [name], MD  calcium carbonate (TUMS - DOSED IN MG ELEMENTAL CALCIUM) 500 MG chewable tablet Chew 2 tablets by mouth every other day.    [provider]  MOUNJARO 5 MG/0.5ML Pen Inject 5 mg into the skin once a week. 11/21/22   [provider]  omeprazole (PRILOSEC) 20 MG capsule Take 1 capsule (20 mg total) by mouth daily. Patient not taking: Reported on 01/05/2023 08/04/21   Antony Madura, PA-C  ondansetron (ZOFRAN-ODT) 4 MG disintegrating tablet Take 1 tablet (4 mg total) by mouth every 8 (eight) hours as needed for nausea or vomiting. Patient not taking: Reported on 01/05/2023 10/26/21   Benjiman Core, MD  potassium chloride (KLOR-CON) 10 MEQ tablet Take 1 tablet (10 mEq total) by mouth daily. 01/05/23   Prosperi, Christian H, PA-C  potassium chloride SA (KLOR-CON M) 20 MEQ tablet Take 1 tablet (20 mEq total) by mouth 2 (two) times daily. Patient not taking: Reported on 01/05/2023 07/24/21   [name], MD  traMADol (ULTRAM) 50 MG tablet Take 1 tablet (50 mg total) by mouth every 6  (six) hours as needed for up to 10 days. For severe pain Patient not taking: Reported on 01/05/2023 01/01/23 01/11/23  Shanon Ace, PA-C  Vitamin D, Ergocalciferol, (DRISDOL) 1.25 MG (50000 UNIT) CAPS capsule Take 50,000 Units by mouth once a week. 06/20/22   [provider]  prochlorperazine (COMPAZINE) 10 MG tablet Take 1 tablet (10 mg total) by mouth every 6 (six) hours as needed (Nausea or vomiting). 10/04/20 12/21/20  Magrinat, Valentino Hue, MD    Physical Exam: Vitals:   01/10/23 0130 01/10/23 0245 01/10/23 0502 01/10/23 0645  BP: (!) 103/53 102/69    Pulse: 92 88    Resp: 20 19    Temp:   (!) 101.5 F (38.6 C) (S) (!) 102.8 F (39.3 C)  TempSrc:   Oral Oral  SpO2: 98% 95%  Physical Exam Vitals and nursing note reviewed.  Constitutional:      General: She is awake. She is not in acute distress.    Appearance: She is obese. She is ill-appearing. She is not toxic-appearing.  HENT:     Head: Normocephalic.     Nose: No rhinorrhea.     Mouth/Throat:     Mouth: Mucous membranes are dry.  Eyes:     General: No scleral icterus.    Pupils: Pupils are equal, round, and reactive to light.  Neck:     Vascular: No JVD.  Cardiovascular:     Rate and Rhythm: Normal rate and regular rhythm.     Heart sounds: S1 normal and S2 normal.  Pulmonary:     Effort: Pulmonary effort is normal.     Breath sounds: Normal breath sounds.  Abdominal:     General: Abdomen is protuberant. Bowel sounds are normal. There is no distension.     Palpations: Abdomen is soft.     Tenderness: There is abdominal tenderness in the epigastric area. There is no right CVA tenderness, left CVA tenderness, guarding or rebound.  Musculoskeletal:     Cervical back: Neck supple.     Right lower leg: No edema.     Left lower leg: No edema.  Skin:    General: Skin is warm and dry.  Neurological:     General: No focal deficit present.     Mental Status: She is alert and oriented to person, place, and  time.  Psychiatric:        Mood and Affect: Mood normal.        Behavior: Behavior normal. Behavior is cooperative.     Data Reviewed:  Results are pending, will review when available. 10/13/2020 transthoracic echocardiogram IMPRESSIONS:   1. Left ventricular ejection fraction, by estimation, is 60 to 65%. The  left ventricle has normal function. The left ventricle has no regional  wall motion abnormalities. Left ventricular diastolic parameters are  normal. The average left ventricular  global longitudinal strain is -18.6 %. The global longitudinal strain is  low normal.   2. Right ventricular systolic function is normal. The right ventricular  size is normal.   3. The mitral valve is abnormal. Trivial mitral valve regurgitation.   4. The aortic valve is tricuspid. Aortic valve regurgitation is not  visualized.   5. The inferior vena cava is normal in size with greater than 50%  respiratory variability, suggesting right atrial pressure of 3 mmHg.   EKG: Vent. rate 109 BPM PR interval 135 ms QRS duration 96 ms QT/QTcB 323/435 ms P-R-T axes 21 66 21 Sinus tachycardia Low voltage, precordial leads  Assessment and Plan: Principal Problem:   Sepsis (HCC) In the setting of:   Febrile neutropenia (HCC) Associated with:   Nausea, vomiting and diarrhea  Admit to PCU/inpatient. Continue IV fluids. Continue cefepime 2 g every 8 hours.   Continue metronidazole 500 mg IVPB q 12 hr. Source seems to be from the GI tract. -Discontinue vancomycin per pharmacy. Follow-up blood culture and sensitivity Follow CBC and CMP in a.m.  Active Problems:   Lytic bone lesions on xray In the setting of:   Malignant neoplasm of upper-outer quadrant of  left breast in female, estrogen receptor positive (HCC) Bone lesion seems to be stable. Continue abemaciclib 100 mg p.o. daily. Continue anastrozole 1 mg p.o. daily. Follow-up with hematology/oncology as scheduled.    Pancytopenia  (HCC) In the setting of malignancy. Follow-up CBC  daily.    Nonalcoholic steatohepatitis Seems to have resolved radiographically. However the patient has:   Transaminitis  And   Hyperbilirubinemia Continue IV hydration. Follow LFTs in AM.    Type 2 diabetes mellitus (HCC) Carbohydrate modified diet. CBG monitoring with RI SS. Check hemoglobin A1c.    Hypokalemia Replacing. Follow potassium level.    Hyponatremia Due to GI losses. Follow sodium level in AM.    Hypocalcemia Recheck calcium level in AM. Further workup depending on results.    Elevated lipase Mildly elevated 72 units/L. It was 60 units/L 5 days ago. Imaging has not shown any imaging signs of pancreatitis.    Advance Care Planning:   Code Status: Full Code   Consults:   Family Communication:   Severity of Illness: The appropriate patient status for this patient is INPATIENT. Inpatient status is judged to be reasonable and necessary in order to provide the required intensity of service to ensure the patient's safety. The patient's presenting symptoms, physical exam findings, and initial radiographic and laboratory data in the context of their chronic comorbidities is felt to place them at high risk for further clinical deterioration. Furthermore, it is not anticipated that the patient will be medically stable for discharge from the hospital within 2 midnights of admission.   * I certify that at the point of admission it is my clinical judgment that the patient will require inpatient hospital care spanning beyond 2 midnights from the point of admission due to high intensity of service, high risk for further deterioration and high frequency of surveillance required.*  Author: Bobette Mo, MD 01/10/2023 7:11 AM  For on call review [email or web address].   This document was prepared using Dragon voice recognition software and may contain some unintended transcription errors.

## 2023-01-10 NOTE — Progress Notes (Signed)
ONCOLOGY  Patient has been admitted with abdominal pain nausea vomiting and diarrhea.  When we saw her on 01/06/2023, we reviewed her symptoms which felt like it was infectious etiology.  At that time the symptoms had improved.  But it appears that they have resumed.  -While the patient was in the hospital please hold off on Verzinio -Cytopenias: Please obtain CBC with differential so that we can see the neutrophil counts.  Her ANC on 01/09/2023 was 1.7.  Therefore I do not believe she has really significant neutropenia. -Agree with Flagyl, supportive care with fluids  We will reassess our plan once she gets discharged from the hospital about whether or not the symptoms were related to Verzinio based on any infectious disease workup findings.

## 2023-01-10 NOTE — Progress Notes (Addendum)
Pharmacy Antibiotic Note  Gabriela Sullivan is a 32 y.o. female admitted on 01/09/2023 with past medical history significant for breast cancer, migraines, PCOS, asthma presents to the ED complaining of abdominal pain. Marland Kitchen  Pharmacy has been consulted to dose vancomycin and zosyn for sepsis.  Plan: Vancomycin 1gm IV x 1 then 1250mg  q24h (AUC 470.9, Scr 0.8) Zosyn 3.375g IV Q8H infused over 4hrs. Follow renal function,cultures and clinical course     Temp (24hrs), Avg:100.9 F (38.3 C), Min:98.4 F (36.9 C), Max:103.1 F (39.5 C)  Recent Labs  Lab 01/05/23 2032 01/09/23 2205 01/09/23 2224  WBC 2.4* 3.0*  --   CREATININE 0.68 0.67  --   LATICACIDVEN  --   --  0.5    Estimated Creatinine Clearance: 95.8 mL/min (by C-G formula based on SCr of 0.67 mg/dL).    No Known Allergies  Antimicrobials this admission: 8/9 vanc >> 8/9 zosyn >>  Dose adjustments this admission:   Microbiology results: 8/8 BCx:   Thank you for allowing pharmacy to be a part of this patient's care. Arley Phenix RPh 01/10/2023, 5:07 AM  Consult for zosyn d/c'd and consult for pharmacy to dose cefepime  Cefepime 2gm IV q8h  Arley Phenix RPh 01/10/2023, 5:57 AM

## 2023-01-10 NOTE — Progress Notes (Signed)
Prior-To-Admission Oral Chemotherapy for Treatment of Oncologic Disease   Order noted from Dr. Emmit Pomfret to continue prior-to-admission oral chemotherapy regimen of Verzenio.  Procedure Per Pharmacy & Therapeutics Committee Policy: Orders for continuation of home oral chemotherapy for treatment of an oncologic disease will be held unless approved by an oncologist during current admission.    For patients receiving oncology care at [facility], inpatient pharmacist contacts patient's oncologist during regular office hours to review. If earlier review is medically necessary, attending physician consults [facility] on-call oncologist   For patients receiving oncology care outside of [facility], attending physician consults patient's oncologist to review. If this oncologist or their coverage cannot be reached, attending physician consults [facility] on-call oncologist   Oral chemotherapy continuation order is on hold pending oncologist review, [facility] oncologist Gudena will be notified by inpatient pharmacy during office hours    Junita Push PharmD 01/10/2023, 7:14 AM

## 2023-01-10 NOTE — ED Notes (Signed)
ED TO INPATIENT HANDOFF REPORT  ED Nurse Name and Phone #: Lona Kettle Name/Age/Gender Gabriela Sullivan 32 y.o. female Room/Bed: WA15/WA15  Code Status   Code Status: Full Code  Home/SNF/Other Home Patient oriented to: self, place, time, and situation Is this baseline? Yes   Triage Complete: Triage complete  Chief Complaint Sepsis [facility]) [A41.9]  Triage Note Patient coming to ED for evaluation of abdominal pain.  Reports she was seen during the weekend for nausea and vomiting.  On Tuesday, patient began to have abdominal pain.  States pain is burning and radiates into L flank.  Has had intermittent fevers.  Patient currently receiving chemo for breast cancer.    Allergies No Known Allergies  Level of Care/Admitting Diagnosis ED Disposition     ED Disposition  Admit   Condition  --   Comment  Hospital Area: [facility] [[id number]]  Level of Care: Telemetry [5]  Admit to tele based on following criteria: Other see comments  Comments: Sepsis  May admit patient to [facility] or [facility] if equivalent level of care is available:: No  Covid Evaluation: Confirmed COVID Negative  Diagnosis: Sepsis [facility]) [[id number]]  Admitting Physician: Tonye Royalty [[id number]]  Attending Physician: Janann Colonel  Certification:: I certify this patient will need inpatient services for at least 2 midnights  Estimated Length of Stay: 2          B Medical/Surgery History Past Medical History:  Diagnosis Date   Asthma    remote   Breast cancer (HCC)    Elevated liver enzymes    Family history of breast cancer    Family history of ovarian cancer    Family history of prostate cancer    Family history of stomach cancer    Hepatic steatosis    Hyperglycemia    A1c 7.6% 12/19/20   Migraines    PCOS (polycystic ovarian syndrome)    Pre-diabetes    Past Surgical History:  Procedure Laterality Date   MASTECTOMY W/ SENTINEL NODE BIOPSY Right 01/31/2021    Procedure: RIGHT SIMPLE MASTECTOMY;  Surgeon: Harriette Bouillon, MD;  Location: [facility] OR;  Service: General;  Laterality: Right;   MODIFIED MASTECTOMY Left 01/31/2021   Procedure: LEFT MODIFIED RADICAL MASTECTOMY;  Surgeon: Harriette Bouillon, MD;  Location: [facility] OR;  Service: General;  Laterality: Left;   NO PAST SURGERIES     PORTACATH PLACEMENT Right 10/18/2020   Procedure: INSERTION PORT-A-CATH;  Surgeon: Harriette Bouillon, MD;  Location: [facility];  Service: General;  Laterality: Right;     A IV Location/Drains/Wounds Patient Lines/Drains/Airways Status     Active Line/Drains/Airways     Name Placement date Placement time Site Days   Implanted Port 10/18/20 Right Chest 10/18/20  0925  Chest  814   Closed System Drain 1 Left;Lateral Breast Bulb (JP) 19 Fr. 01/31/21  1654  Breast  709   Closed System Drain 2 Lateral;Left Breast Bulb (JP) 19 Fr. 01/31/21  1657  Breast  709   Closed System Drain 3 Lateral;Right Breast Bulb (JP) 19 Fr. 01/31/21  1658  Breast  709   Closed System Drain 4 Lateral;Right Breast Bulb (JP) 19 Fr. 01/31/21  1659  Breast  709            Intake/Output Last 24 hours  Intake/Output Summary (Last 24 hours) at 01/10/2023 1411 Last data filed at 01/10/2023 0515 Gross per 24 hour  Intake 100.96 ml  Output --  Net 100.96 ml  Labs/Imaging Results for orders placed or performed during the hospital encounter of 01/09/23 (from the past 48 hour(s))  Comprehensive metabolic panel     Status: Abnormal   Collection Time: 01/09/23 10:05 PM  Result Value Ref Range   Sodium 131 (L) 135 - 145 mmol/L   Potassium 3.2 (L) 3.5 - 5.1 mmol/L   Chloride 100 98 - 111 mmol/L   CO2 23 22 - 32 mmol/L   Glucose, Bld 99 70 - 99 mg/dL    Comment: Glucose reference range applies only to samples taken after fasting for at least 8 hours.   BUN 13 6 - 20 mg/dL   Creatinine, Ser 1.61 0.44 - 1.00 mg/dL   Calcium 8.1 (L) 8.9 - 10.3 mg/dL   Total Protein 7.0 6.5 - 8.1 g/dL    Albumin 3.7 3.5 - 5.0 g/dL   AST 64 (H) 15 - 41 U/L   ALT 50 (H) 0 - 44 U/L   Alkaline Phosphatase 38 38 - 126 U/L   Total Bilirubin 1.3 (H) 0.3 - 1.2 mg/dL   GFR, Estimated >09 >60 mL/min    Comment: (NOTE) Calculated using the CKD-EPI Creatinine Equation (2021)    Anion gap 8 5 - 15    Comment: Performed at [facility], [address], [place], [place] [address]  CBC with Differential     Status: Abnormal   Collection Time: 01/09/23 10:05 PM  Result Value Ref Range   WBC 3.0 (L) 4.0 - 10.5 K/uL   RBC 2.58 (L) 3.87 - 5.11 MIL/uL   Hemoglobin 8.8 (L) 12.0 - 15.0 g/dL   HCT 81.1 (L) 91.4 - 78.2 %   MCV 96.9 80.0 - 100.0 fL   MCH 34.1 (H) 26.0 - 34.0 pg   MCHC 35.2 30.0 - 36.0 g/dL   RDW 95.6 21.3 - 08.6 %   Platelets 137 (L) 150 - 400 K/uL   nRBC 0.0 0.0 - 0.2 %   Neutrophils Relative % 58 %   Neutro Abs 1.7 1.7 - 7.7 K/uL   Lymphocytes Relative 34 %   Lymphs Abs 1.0 0.7 - 4.0 K/uL   Monocytes Relative 5 %   Monocytes Absolute 0.2 0.1 - 1.0 K/uL   Eosinophils Relative 1 %   Eosinophils Absolute 0.0 0.0 - 0.5 K/uL   Basophils Relative 2 %   Basophils Absolute 0.1 0.0 - 0.1 K/uL   WBC Morphology FEW ATYPICAL LYMPHOCYTES    Immature Granulocytes 0 %   Abs Immature Granulocytes 0.01 0.00 - 0.07 K/uL   Ovalocytes PRESENT     Comment: Performed at [facility], [address], [place], [place] [address]  Protime-INR     Status: Abnormal   Collection Time: 01/09/23 10:05 PM  Result Value Ref Range   Prothrombin Time 15.8 (H) 11.4 - 15.2 seconds   INR 1.2 0.8 - 1.2    Comment: (NOTE) INR goal varies based on device and disease states. Performed at [facility], [address], [place], [place] [address]   APTT     Status: None   Collection Time: 01/09/23 10:05 PM  Result Value Ref Range   aPTT 33 24 - 36 seconds    Comment: Performed at [facility], [address], [place], [place] [address]   Blood Culture (routine x 2)     Status: None (Preliminary result)   Collection Time: 01/09/23 10:05 PM   Specimen: BLOOD  Result Value Ref Range  Specimen Description      BLOOD PORTA CATH Performed at [facility], [address] [place], [place] [address]    Special Requests      BOTTLES DRAWN AEROBIC AND ANAEROBIC Blood Culture results may not be optimal due to an excessive volume of blood received in culture bottles Performed at [facility], [address] [place], [place] [address]    Culture      NO GROWTH < 12 HOURS Performed at [facility], [address], [place], [place] [address]    Report Status PENDING   hCG, serum, qualitative     Status: None   Collection Time: 01/09/23 10:05 PM  Result Value Ref Range   Preg, Serum NEGATIVE NEGATIVE    Comment:        THE SENSITIVITY OF THIS METHODOLOGY IS >10 mIU/mL. Performed at [facility], [address], [place], [place] [address]   Magnesium     Status: None   Collection Time: 01/09/23 10:05 PM  Result Value Ref Range   Magnesium 2.3 1.7 - 2.4 mg/dL    Comment: Performed at [facility], [address] [place], [place] [address]  Lipase, blood     Status: Abnormal   Collection Time: 01/09/23 10:05 PM  Result Value Ref Range   Lipase 72 (H) 11 - 51 U/L    Comment: Performed at [facility], [address] [place], [place] [address]  I-Stat Lactic Acid, ED     Status: None   Collection Time: 01/09/23 10:24 PM  Result Value Ref Range   Lactic Acid, Venous 0.5 0.5 - 1.9 mmol/L  Urinalysis, w/ Reflex to Culture (Infection Suspected) -Urine, Clean Catch     Status: Abnormal   Collection Time: 01/09/23 11:00 PM  Result Value Ref Range   Specimen Source URINE, CLEAN CATCH    Color, Urine AMBER (A) YELLOW    Comment: BIOCHEMICALS MAY BE AFFECTED BY COLOR   APPearance HAZY (A) CLEAR   Specific Gravity, Urine 1.023  1.005 - 1.030   pH 5.0 5.0 - 8.0   Glucose, UA NEGATIVE NEGATIVE mg/dL   Hgb urine dipstick NEGATIVE NEGATIVE   Bilirubin Urine NEGATIVE NEGATIVE   Ketones, ur 20 (A) NEGATIVE mg/dL   Protein, ur NEGATIVE NEGATIVE mg/dL   Nitrite NEGATIVE NEGATIVE   Leukocytes,Ua NEGATIVE NEGATIVE   RBC / HPF 0-5 0 - 5 RBC/hpf   WBC, UA 0-5 0 - 5 WBC/hpf    Comment:        Reflex urine culture not performed if WBC <=10, OR if Squamous epithelial cells >5. If Squamous epithelial cells >5 suggest recollection.    Bacteria, UA NONE SEEN NONE SEEN   Squamous Epithelial / HPF 0-5 0 - 5 /HPF   Mucus PRESENT     Comment: Performed at [facility], [address] [place], [place] [address]  Blood Culture (routine x 2)     Status: None (Preliminary result)   Collection Time: 01/09/23 11:55 PM   Specimen: BLOOD RIGHT HAND  Result Value Ref Range   Specimen Description      BLOOD RIGHT HAND Performed at [facility], [address], [place], [place] [address]    Special Requests      BOTTLES DRAWN AEROBIC AND ANAEROBIC Blood Culture adequate volume Performed at [facility], [address], [place], [place] [address]    Culture  NO GROWTH < 12 HOURS Performed at [facility], [address], [place], [place] [address]    Report Status PENDING   CBC     Status: Abnormal   Collection Time: 01/10/23  6:47 AM  Result Value Ref Range   WBC 2.4 (L) 4.0 - 10.5 K/uL   RBC 2.63 (L) 3.87 - 5.11 MIL/uL   Hemoglobin 9.0 (L) 12.0 - 15.0 g/dL   HCT 21.3 (L) 08.6 - 57.8 %   MCV 98.1 80.0 - 100.0 fL   MCH 34.2 (H) 26.0 - 34.0 pg   MCHC 34.9 30.0 - 36.0 g/dL   RDW 46.9 62.9 - 52.8 %   Platelets 106 (L) 150 - 400 K/uL   nRBC 0.0 0.0 - 0.2 %    Comment: Performed at [facility], [address], [place], [place] [address]  Creatinine, serum     Status: None   Collection Time: 01/10/23  6:47 AM  Result Value Ref Range   Creatinine, Ser  0.63 0.44 - 1.00 mg/dL   GFR, Estimated >40 >10 mL/min    Comment: (NOTE) Calculated using the CKD-EPI Creatinine Equation (2021) Performed at [facility], [address], [place], [place] [address]   HIV Antibody (routine testing w rflx)     Status: None   Collection Time: 01/10/23  6:47 AM  Result Value Ref Range   HIV Screen 4th Generation wRfx Non Reactive Non Reactive    Comment: Performed at [facility], [address] [place], [place] [address]  Hemoglobin A1c     Status: Abnormal   Collection Time: 01/10/23  6:58 AM  Result Value Ref Range   Hgb A1c MFr Bld 4.1 (L) 4.8 - 5.6 %    Comment: (NOTE) Pre diabetes:          5.7%-6.4%  Diabetes:              >6.4%  Glycemic control for   <7.0% adults with diabetes    Mean Plasma Glucose 70.97 mg/dL    Comment: Performed at [facility], [address] [place], [place] [address]  I-Stat Lactic Acid, ED     Status: None   Collection Time: 01/10/23  7:05 AM  Result Value Ref Range   Lactic Acid, Venous 0.5 0.5 - 1.9 mmol/L  CBG monitoring, ED     Status: None   Collection Time: 01/10/23 11:41 AM  Result Value Ref Range   Glucose-Capillary 93 70 - 99 mg/dL    Comment: Glucose reference range applies only to samples taken after fasting for at least 8 hours.   CT Angio Chest PE W/Cm &/Or Wo Cm  Result Date: 01/10/2023 CLINICAL DATA:  Right breast cancer, status post bilateral mastectomy. Fever and flank pain. Evaluate for PE. EXAM: CT ANGIOGRAPHY CHEST WITH CONTRAST TECHNIQUE: Multidetector CT imaging of the chest was performed using the standard protocol during bolus administration of intravenous contrast. Multiplanar CT image reconstructions and MIPs were obtained to evaluate the vascular anatomy. RADIATION DOSE REDUCTION: This exam was performed according to the departmental dose-optimization program which includes automated exposure control, adjustment of the mA and/or kV according to patient  size and/or use of iterative reconstruction technique. CONTRAST:  75mL OMNIPAQUE IOHEXOL 350 MG/ML SOLN COMPARISON:  CT chest dated 12/31/2022. FINDINGS: Cardiovascular: Satisfactory opacification of the bilateral pulmonary arteries to the lobar level. No evidence of pulmonary embolism. Although not tailored for evaluation of the thoracic aorta, there is  no evidence of thoracic aortic aneurysm or dissection. Heart is normal in size.  No pericardial effusion. Right chest port terminates in the upper right atrium. Mediastinum/Nodes: No suspicious mediastinal lymphadenopathy. Status post left axillary lymph node dissection with residual 7 mm short axis left axillary node (series 4/image 44), suspicious. Lungs/Pleura: Radiation changes in the anterior left upper lobe. No suspicious pulmonary nodules. No focal consolidation. No pleural effusion or pneumothorax. Upper Abdomen: Visualized upper abdomen is grossly unremarkable. Musculoskeletal: Status post bilateral mastectomy. Visualized osseous structures are within normal limits. Review of the MIP images confirms the above findings. IMPRESSION: No evidence of pulmonary embolism. Status post bilateral mastectomy with left axillary lymph node dissection. Radiation changes in the anterior left upper lobe. Residual 7 mm short axis left axillary node, unchanged from recent CT, suspicious for nodal metastasis. Electronically Signed   By: Charline Bills M.D.   On: 01/10/2023 03:35   DG Chest Port 1 View  Result Date: 01/09/2023 CLINICAL DATA:  Questionable sepsis EXAM: PORTABLE CHEST 1 VIEW COMPARISON:  Chest x-ray 620 1,002 FINDINGS: Right chest port catheter tip ends in the SVC. The heart size and mediastinal contours are within normal limits. Both lungs are clear. The visualized skeletal structures are unremarkable. IMPRESSION: No active disease. Electronically Signed   By: Darliss Cheney M.D.   On: 01/09/2023 21:59    Pending Labs Unresulted Labs (From admission,  onward)     Start     Ordered   01/17/23 0500  Creatinine, serum  (enoxaparin (LOVENOX)    CrCl >/= 30 ml/min)  Weekly,   R     Comments: while on enoxaparin therapy    01/10/23 0646   01/11/23 0500  Comprehensive metabolic panel  Tomorrow morning,   R        01/10/23 0646   01/11/23 0500  CBC  Tomorrow morning,   R        01/10/23 0646            Vitals/Pain Today's Vitals   01/10/23 1000 01/10/23 1100 01/10/23 1234 01/10/23 1300  BP: (!) 106/56 (!) 113/57  118/62  Pulse: 99 (!) 104  92  Resp:  16  16  Temp:   99.4 F (37.4 C)   TempSrc:      SpO2: 98% 97%  98%  PainSc:        Isolation Precautions No active isolations  Medications Medications  anastrozole (ARIMIDEX) tablet 1 mg (1 mg Oral Given 01/10/23 1139)  calcium carbonate (TUMS - dosed in mg elemental calcium) chewable tablet 400 mg of elemental calcium (400 mg of elemental calcium Oral Given 01/10/23 1139)  Vitamin D (Ergocalciferol) (DRISDOL) 1.25 MG (50000 UNIT) capsule 50,000 Units (has no administration in time range)  sodium chloride flush (NS) 0.9 % injection 3 mL (3 mLs Intravenous Given 01/10/23 1140)  acetaminophen (TYLENOL) tablet 650 mg (has no administration in time range)    Or  acetaminophen (TYLENOL) suppository 650 mg (has no administration in time range)  HYDROcodone-acetaminophen (NORCO/VICODIN) 5-325 MG per tablet 1-2 tablet (has no administration in time range)  morphine (PF) 2 MG/ML injection 1 mg (has no administration in time range)  traZODone (DESYREL) tablet 25 mg (has no administration in time range)  senna-docusate (Senokot-S) tablet 1 tablet (has no administration in time range)  bisacodyl (DULCOLAX) EC tablet 5 mg (has no administration in time range)  ondansetron (ZOFRAN) tablet 4 mg (has no administration in time range)    Or  ondansetron (ZOFRAN) injection  4 mg (has no administration in time range)  lactated ringers infusion (150 mL/hr Intravenous New Bag/Given 01/10/23 0759)   metroNIDAZOLE (FLAGYL) IVPB 500 mg (0 mg Intravenous Stopped 01/10/23 0848)  ceFEPIme (MAXIPIME) 2 g in sodium chloride 0.9 % 100 mL IVPB (0 g Intravenous Stopped 01/10/23 0848)  ipratropium-albuterol (DUONEB) 0.5-2.5 (3) MG/3ML nebulizer solution 3 mL (has no administration in time range)  potassium chloride (KLOR-CON) CR tablet 10 mEq (has no administration in time range)  pantoprazole (PROTONIX) injection 40 mg (40 mg Intravenous Given 01/10/23 1139)  insulin aspart (novoLOG) injection 0-15 Units ( Subcutaneous Not Given 01/10/23 1213)  enoxaparin (LOVENOX) injection 40 mg (has no administration in time range)  acetaminophen (TYLENOL) tablet 1,000 mg (1,000 mg Oral Given 01/09/23 2245)  lactated ringers bolus 1,000 mL (1,000 mLs Intravenous Bolus 01/09/23 2245)  potassium chloride 10 mEq in 100 mL IVPB (0 mEq Intravenous Stopped 01/10/23 0515)  piperacillin-tazobactam (ZOSYN) IVPB 3.375 g (0 g Intravenous Stopped 01/10/23 0118)  iohexol (OMNIPAQUE) 350 MG/ML injection 75 mL (75 mLs Intravenous Contrast Given 01/10/23 0257)  vancomycin (VANCOCIN) IVPB 1000 mg/200 mL premix (0 mg Intravenous Stopped 01/10/23 0645)  potassium chloride SA (KLOR-CON M) CR tablet 40 mEq (40 mEq Oral Given 01/10/23 0837)    Mobility walks     Focused Assessments Ab pain   R Recommendations: See Admitting Provider Note  Report given to:   Additional Notes: .

## 2023-01-11 DIAGNOSIS — D709 Neutropenia, unspecified: Secondary | ICD-10-CM | POA: Diagnosis not present

## 2023-01-11 DIAGNOSIS — R5081 Fever presenting with conditions classified elsewhere: Secondary | ICD-10-CM

## 2023-01-11 LAB — COMPREHENSIVE METABOLIC PANEL
ALT: 45 U/L — ABNORMAL HIGH (ref 0–44)
AST: 51 U/L — ABNORMAL HIGH (ref 15–41)
Albumin: 2.7 g/dL — ABNORMAL LOW (ref 3.5–5.0)
Alkaline Phosphatase: 34 U/L — ABNORMAL LOW (ref 38–126)
Anion gap: 8 (ref 5–15)
BUN: 6 mg/dL (ref 6–20)
CO2: 24 mmol/L (ref 22–32)
Calcium: 7.8 mg/dL — ABNORMAL LOW (ref 8.9–10.3)
Chloride: 103 mmol/L (ref 98–111)
Creatinine, Ser: 0.57 mg/dL (ref 0.44–1.00)
GFR, Estimated: >60 mL/min (ref >60)
Glucose, Bld: 113 mg/dL — ABNORMAL HIGH (ref 70–99)
Potassium: 3 mmol/L — ABNORMAL LOW (ref 3.5–5.1)
Sodium: 135 mmol/L (ref 135–145)
Total Bilirubin: 1.2 mg/dL (ref 0.3–1.2)
Total Protein: 5.8 g/dL — ABNORMAL LOW (ref 6.5–8.1)

## 2023-01-11 LAB — CBC WITH DIFFERENTIAL/PLATELET
Abs Immature Granulocytes: 0.01 10*3/uL (ref 0.00–0.07)
Basophils Absolute: 0 10*3/uL (ref 0.0–0.1)
Basophils Relative: 1 %
Eosinophils Absolute: 0 10*3/uL (ref 0.0–0.5)
Eosinophils Relative: 0 %
HCT: 23.4 % — ABNORMAL LOW (ref 36.0–46.0)
Hemoglobin: 8.2 g/dL — ABNORMAL LOW (ref 12.0–15.0)
Immature Granulocytes: 0 %
Lymphocytes Relative: 25 %
Lymphs Abs: 1 10*3/uL (ref 0.7–4.0)
MCH: 34.9 pg — ABNORMAL HIGH (ref 26.0–34.0)
MCHC: 35 g/dL (ref 30.0–36.0)
MCV: 99.6 fL (ref 80.0–100.0)
Monocytes Absolute: 0.1 10*3/uL (ref 0.1–1.0)
Monocytes Relative: 3 %
Neutro Abs: 2.7 10*3/uL (ref 1.7–7.7)
Neutrophils Relative %: 71 %
Platelets: 101 10*3/uL — ABNORMAL LOW (ref 150–400)
RBC: 2.35 MIL/uL — ABNORMAL LOW (ref 3.87–5.11)
RDW: 14.4 % (ref 11.5–15.5)
WBC: 3.8 10*3/uL — ABNORMAL LOW (ref 4.0–10.5)
nRBC: 0 % (ref 0.0–0.2)

## 2023-01-11 LAB — GLUCOSE, CAPILLARY
Glucose-Capillary: 86 mg/dL (ref 70–99)
Glucose-Capillary: 89 mg/dL (ref 70–99)
Glucose-Capillary: 85 mg/dL (ref 70–99)
Glucose-Capillary: 99 mg/dL (ref 70–99)

## 2023-01-11 LAB — PHOSPHORUS: Phosphorus: 2.6 mg/dL (ref 2.5–4.6)

## 2023-01-11 LAB — MAGNESIUM: Magnesium: 2.1 mg/dL (ref 1.7–2.4)

## 2023-01-11 MED ORDER — POTASSIUM CHLORIDE CRYS ER 20 MEQ PO TBCR
40.0000 meq | EXTENDED_RELEASE_TABLET | Freq: Once | ORAL | Status: AC
  Administered 2023-01-11: 40 meq via ORAL
  Filled 2023-01-11: qty 2

## 2023-01-11 MED ORDER — PANTOPRAZOLE SODIUM 40 MG PO TBEC
40.0000 mg | DELAYED_RELEASE_TABLET | Freq: Every day | ORAL | Status: DC
  Administered 2023-01-12 – 2023-01-13 (×2): 40 mg via ORAL
  Filled 2023-01-11 (×2): qty 1

## 2023-01-11 MED ORDER — CHLORHEXIDINE GLUCONATE CLOTH 2 % EX PADS
6.0000 | MEDICATED_PAD | Freq: Every day | CUTANEOUS | Status: DC
  Administered 2023-01-11 – 2023-01-13 (×3): 6 via TOPICAL

## 2023-01-11 NOTE — Progress Notes (Signed)
Received call from CCMD about heart rate in the 120's-130's. Checked on patient and she was going to the bathroom.

## 2023-01-11 NOTE — Progress Notes (Signed)
   01/11/23 0801  Assess: MEWS Score  Temp (!) 101.5 F (38.6 C) (notified nurse, put patient on yellow mews)  ECG Heart Rate (!) 114  Resp (!) 32  Assess: MEWS Score  MEWS Temp 2  MEWS Systolic 0  MEWS Pulse 2  MEWS RR 2  MEWS LOC 0  MEWS Score 6  MEWS Score Color Red  Assess: if the MEWS score is Yellow or Red  Were vital signs accurate and taken at a resting state? Yes  Does the patient meet 2 or more of the SIRS criteria? No  Does the patient have a confirmed or suspected source of infection? No  MEWS guidelines implemented  Yes, yellow  Treat  MEWS Interventions Considered administering scheduled or prn medications/treatments as ordered  Take Vital Signs  Increase Vital Sign Frequency  Yellow: Q2hr x1, continue Q4hrs until patient remains green for 12hrs  Escalate  MEWS: Escalate Yellow: Discuss with charge nurse and consider notifying provider and/or RRT  Notify: Charge Nurse/RN  Name of Charge Nurse/RN Notified Marjorie Smolder  Provider Notification  Provider Name/Title Dr Dorcas Carrow  Date Provider Notified 01/11/23  Time Provider Notified 0830  Method of Notification Page  Provider response No new orders  Assess: SIRS CRITERIA  SIRS Temperature  1  SIRS Pulse 1  SIRS Respirations  1  SIRS WBC 0  SIRS Score Sum  3

## 2023-01-11 NOTE — Progress Notes (Signed)
PROGRESS NOTE    Gabriela Sullivan  WUJ:[id number] DOB: 09/11/1990 DOA: 01/09/2023 PCP: Leane Call, MD    Brief Narrative:  32 year old female with history of breast cancer status post bilateral mastectomy, metastatic disease and currently on abemaciclib and anastrozole having abdominal pain, nausea, low-grade fever for 2 weeks, recent chemotherapy followed by worsening nausea vomiting and diarrhea.  Admitted due to pancytopenia, fever.   Assessment & Plan:  Sepsis present on admission, primary source unknown.  Patient with persistent fever.  ANC count more than 1K. UA was normal Chest exam is normal CT chest with no evidence of pulmonary embolism, radiation changes anterior left upper lobe, no evidence of pneumonia. Blood cultures negative so far. CT scan abdomen pelvis with contrast on 01/05/2023 with no acute findings. Nausea vomiting is improving after symptomatic treatment.  Continues to be febrile. Continue maintenance fluid, continue cefepime and Flagyl for now pending clinical improvement or final cultures.  Followed by oncology.  ANC is adequate.  Metastatic breast cancer: Followed by oncology.  Anastrozole and abemaciclib is on hold.  Hypokalemia: Replaced.  Monitor levels.  Magnesium is adequate.  DVT prophylaxis: SCDs Start: 01/10/23 9562   Code Status: Full code Family Communication: Friend at the bedside Disposition Plan: Status is: Inpatient Remains inpatient appropriate because: Persistent fever, immunocompromised patient     Consultants:  Oncology, following  Procedures:  None  Antimicrobials:  Cefepime and Flagyl 8/9---   Subjective: Patient seen in the morning rounds.  Denies any nausea vomiting or abdominal pain cramping.  Her main issue is mild headache with intermittent fever.  No other overnight events.  Able to eat without nausea.  Objective: Vitals:   01/11/23 0801 01/11/23 0843 01/11/23 0916 01/11/23 1348  BP:   (!) 104/46 (!) 101/53   Pulse:   (!) 101 85  Resp: (!) 32 20    Temp: (!) 101.5 F (38.6 C)  98.7 F (37.1 C) 98.1 F (36.7 C)  TempSrc:   Oral Oral  SpO2:   98% 98%    Intake/Output Summary (Last 24 hours) at 01/11/2023 1446 Last data filed at 01/11/2023 0900 Gross per 24 hour  Intake 1932.27 ml  Output --  Net 1932.27 ml   There were no vitals filed for this visit.  Examination:  General exam: Appears calm and comfortable  Interactive.  Alert awake and oriented.  No focal logical deficits. Respiratory system: No added sounds. Cardiovascular system: No added sounds.  No edema. Gastrointestinal system: Abdomen is nondistended, soft and nontender. No organomegaly or masses felt. Normal bowel sounds heard. Central nervous system: Alert and oriented. No focal neurological deficits. Extremities:  Port right chest wall clean and nontender.    Data Reviewed: I have personally reviewed following labs and imaging studies  CBC: Recent Labs  Lab 01/05/23 2032 01/09/23 2205 01/10/23 0647 01/11/23 1027  WBC 2.4* 3.0* 2.4* 3.8*  NEUTROABS 1.5* 1.7  --  2.7  HGB 10.4* 8.8* 9.0* 8.2*  HCT 28.5* 25.0* 25.8* 23.4*  MCV 94.7 96.9 98.1 99.6  PLT 154 137* 106* 101*   Basic Metabolic Panel: Recent Labs  Lab 01/05/23 2032 01/09/23 2205 01/10/23 0647 01/11/23 1027  NA 135 131*  --  135  K 2.8* 3.2*  --  3.0*  CL 101 100  --  103  CO2 26 23  --  24  GLUCOSE 98 99  --  113*  BUN 12 13  --  6  CREATININE 0.68 0.67 0.63 0.57  CALCIUM 8.2* 8.1*  --  7.8*  MG  --  2.3  --  2.1  PHOS  --   --   --  2.6   GFR: Estimated Creatinine Clearance: 95.8 mL/min (by C-G formula based on SCr of 0.57 mg/dL). Liver Function Tests: Recent Labs  Lab 01/05/23 2032 01/09/23 2205 01/11/23 1027  AST 47* 64* 51*  ALT 38 50* 45*  ALKPHOS 42 38 34*  BILITOT 2.0* 1.3* 1.2  PROT 7.2 7.0 5.8*  ALBUMIN 3.9 3.7 2.7*   Recent Labs  Lab 01/05/23 2032 01/09/23 2205  LIPASE 60* 72*   No results for input(s):  "AMMONIA" in the last 168 hours. Coagulation Profile: Recent Labs  Lab 01/09/23 2205  INR 1.2   Cardiac Enzymes: No results for input(s): "CKTOTAL", "CKMB", "CKMBINDEX", "TROPONINI" in the last 168 hours. BNP (last 3 results) No results for input(s): "PROBNP" in the last 8760 hours. HbA1C: Recent Labs    01/10/23 0658  HGBA1C 4.1*   CBG: Recent Labs  Lab 01/10/23 1141 01/10/23 1608 01/11/23 0728 01/11/23 1256  GLUCAP 93 109* 86 89   Lipid Profile: No results for input(s): "CHOL", "HDL", "LDLCALC", "TRIG", "CHOLHDL", "LDLDIRECT" in the last 72 hours. Thyroid Function Tests: No results for input(s): "TSH", "T4TOTAL", "FREET4", "T3FREE", "THYROIDAB" in the last 72 hours. Anemia Panel: No results for input(s): "VITAMINB12", "FOLATE", "FERRITIN", "TIBC", "IRON", "RETICCTPCT" in the last 72 hours. Sepsis Labs: Recent Labs  Lab 01/09/23 2224 01/10/23 0705  LATICACIDVEN 0.5 0.5    Recent Results (from the past 240 hour(s))  Resp panel by RT-PCR (RSV, Flu A&B, Covid) Anterior Nasal Swab     Status: None   Collection Time: 01/05/23  7:31 PM   Specimen: Anterior Nasal Swab  Result Value Ref Range Status   SARS Coronavirus 2 by RT PCR NEGATIVE NEGATIVE Final    Comment: (NOTE) SARS-CoV-2 target nucleic acids are NOT DETECTED.  The SARS-CoV-2 RNA is generally detectable in upper respiratory specimens during the acute phase of infection. The lowest concentration of SARS-CoV-2 viral copies this assay can detect is 138 copies/mL. A negative result does not preclude SARS-Cov-2 infection and should not be used as the sole basis for treatment or other patient management decisions. A negative result may occur with  improper specimen collection/handling, submission of specimen other than nasopharyngeal swab, presence of viral mutation(s) within the areas targeted by this assay, and inadequate number of viral copies(<138 copies/mL). A negative result must be combined  with clinical observations, patient history, and epidemiological information. The expected result is Negative.  Fact Sheet for Patients:  BloggerCourse.com  Fact Sheet for Healthcare Providers:  SeriousBroker.it  This test is no t yet approved or cleared by the [place] FDA and  has been authorized for detection and/or diagnosis of SARS-CoV-2 by FDA under an Emergency Use Authorization (EUA). This EUA will remain  in effect (meaning this test can be used) for the duration of the COVID-19 declaration under Section 564(b)(1) of the Act, 21 U.S.C.section 360bbb-3(b)(1), unless the authorization is terminated  or revoked sooner.       Influenza A by PCR NEGATIVE NEGATIVE Final   Influenza B by PCR NEGATIVE NEGATIVE Final    Comment: (NOTE) The Xpert Xpress SARS-CoV-2/FLU/RSV plus assay is intended as an aid in the diagnosis of influenza from Nasopharyngeal swab specimens and should not be used as a sole basis for treatment. Nasal washings and aspirates are unacceptable for Xpert Xpress SARS-CoV-2/FLU/RSV testing.  Fact Sheet for Patients: BloggerCourse.com  Fact Sheet for Healthcare Providers:  SeriousBroker.it  This test is not yet approved or cleared by the [place] and has been authorized for detection and/or diagnosis of SARS-CoV-2 by FDA under an Emergency Use Authorization (EUA). This EUA will remain in effect (meaning this test can be used) for the duration of the COVID-19 declaration under Section 564(b)(1) of the Act, 21 U.S.C. section 360bbb-3(b)(1), unless the authorization is terminated or revoked.     Resp Syncytial Virus by PCR NEGATIVE NEGATIVE Final    Comment: (NOTE) Fact Sheet for Patients: BloggerCourse.com  Fact Sheet for Healthcare Providers: SeriousBroker.it  This test is not yet approved  or cleared by the [place] FDA and has been authorized for detection and/or diagnosis of SARS-CoV-2 by FDA under an Emergency Use Authorization (EUA). This EUA will remain in effect (meaning this test can be used) for the duration of the COVID-19 declaration under Section 564(b)(1) of the Act, 21 U.S.C. section 360bbb-3(b)(1), unless the authorization is terminated or revoked.  Performed at [facility], [address], [place], [place] [address]   Blood Culture (routine x 2)     Status: None (Preliminary result)   Collection Time: 01/09/23 10:05 PM   Specimen: BLOOD  Result Value Ref Range Status   Specimen Description   Final    BLOOD PORTA CATH Performed at [facility], [address] [place], [place] [address]    Special Requests   Final    BOTTLES DRAWN AEROBIC AND ANAEROBIC Blood Culture results may not be optimal due to an excessive volume of blood received in culture bottles Performed at [facility], [address], [place], [place] [address]    Culture   Final    NO GROWTH 2 DAYS Performed at [facility], [address] [place], [place] [address]    Report Status PENDING  Incomplete  Blood Culture (routine x 2)     Status: None (Preliminary result)   Collection Time: 01/09/23 11:55 PM   Specimen: BLOOD RIGHT HAND  Result Value Ref Range Status   Specimen Description   Final    BLOOD RIGHT HAND Performed at [facility], [address], [place], [place] [address]    Special Requests   Final    BOTTLES DRAWN AEROBIC AND ANAEROBIC Blood Culture adequate volume Performed at [facility], [address] [place], [place] [address]    Culture   Final    NO GROWTH 1 DAY Performed at [facility], [address] [place], [place] [address]    Report Status PENDING  Incomplete         Radiology Studies: CT Angio Chest PE W/Cm &/Or Wo Cm  Result Date:  01/10/2023 CLINICAL DATA:  Right breast cancer, status post bilateral mastectomy. Fever and flank pain. Evaluate for PE. EXAM: CT ANGIOGRAPHY CHEST WITH CONTRAST TECHNIQUE: Multidetector CT imaging of the chest was performed using the standard protocol during bolus administration of intravenous contrast. Multiplanar CT image reconstructions and MIPs were obtained to evaluate the vascular anatomy. RADIATION DOSE REDUCTION: This exam was performed according to the departmental dose-optimization program which includes automated exposure control, adjustment of the mA and/or kV according to patient size and/or use of iterative reconstruction technique. CONTRAST:  75mL OMNIPAQUE IOHEXOL 350 MG/ML SOLN COMPARISON:  CT chest dated 12/31/2022. FINDINGS: Cardiovascular: Satisfactory opacification of the bilateral pulmonary arteries to the lobar level. No evidence of pulmonary embolism. Although not tailored for evaluation of the thoracic  aorta, there is no evidence of thoracic aortic aneurysm or dissection. Heart is normal in size.  No pericardial effusion. Right chest port terminates in the upper right atrium. Mediastinum/Nodes: No suspicious mediastinal lymphadenopathy. Status post left axillary lymph node dissection with residual 7 mm short axis left axillary node (series 4/image 44), suspicious. Lungs/Pleura: Radiation changes in the anterior left upper lobe. No suspicious pulmonary nodules. No focal consolidation. No pleural effusion or pneumothorax. Upper Abdomen: Visualized upper abdomen is grossly unremarkable. Musculoskeletal: Status post bilateral mastectomy. Visualized osseous structures are within normal limits. Review of the MIP images confirms the above findings. IMPRESSION: No evidence of pulmonary embolism. Status post bilateral mastectomy with left axillary lymph node dissection. Radiation changes in the anterior left upper lobe. Residual 7 mm short axis left axillary node, unchanged from recent CT, suspicious  for nodal metastasis. Electronically Signed   By: Charline Bills M.D.   On: 01/10/2023 03:35   DG Chest Port 1 View  Result Date: 01/09/2023 CLINICAL DATA:  Questionable sepsis EXAM: PORTABLE CHEST 1 VIEW COMPARISON:  Chest x-ray 620 1,002 FINDINGS: Right chest port catheter tip ends in the SVC. The heart size and mediastinal contours are within normal limits. Both lungs are clear. The visualized skeletal structures are unremarkable. IMPRESSION: No active disease. Electronically Signed   By: Darliss Cheney M.D.   On: 01/09/2023 21:59        Scheduled Meds:  anastrozole  1 mg Oral Daily   calcium carbonate  2 tablet Oral QODAY   insulin aspart  0-15 Units Subcutaneous TID WC   [START ON 01/12/2023] pantoprazole  40 mg Oral Daily   potassium chloride  10 mEq Oral Daily   sodium chloride flush  3 mL Intravenous Q12H   [START ON 01/12/2023] Vitamin D (Ergocalciferol)  50,000 Units Oral Weekly   Continuous Infusions:  ceFEPime (MAXIPIME) IV 2 g (01/11/23 0751)   metronidazole 500 mg (01/11/23 0842)     LOS: 1 day    Time spent: 35 minutes    Dorcas Carrow, MD [facility]

## 2023-01-12 DIAGNOSIS — A419 Sepsis, unspecified organism: Secondary | ICD-10-CM | POA: Diagnosis not present

## 2023-01-12 LAB — PHOSPHORUS: Phosphorus: 2.4 mg/dL — ABNORMAL LOW (ref 2.5–4.6)

## 2023-01-12 LAB — CBC WITH DIFFERENTIAL/PLATELET
Abs Immature Granulocytes: 0.02 10*3/uL (ref 0.00–0.07)
Basophils Absolute: 0 10*3/uL (ref 0.0–0.1)
Basophils Relative: 1 %
Eosinophils Absolute: 0.1 10*3/uL (ref 0.0–0.5)
Eosinophils Relative: 2 %
HCT: 25.5 % — ABNORMAL LOW (ref 36.0–46.0)
Hemoglobin: 8.6 g/dL — ABNORMAL LOW (ref 12.0–15.0)
Immature Granulocytes: 1 %
Lymphocytes Relative: 24 %
Lymphs Abs: 0.9 10*3/uL (ref 0.7–4.0)
MCH: 33.6 pg (ref 26.0–34.0)
MCHC: 33.7 g/dL (ref 30.0–36.0)
MCV: 99.6 fL (ref 80.0–100.0)
Monocytes Absolute: 0.2 10*3/uL (ref 0.1–1.0)
Monocytes Relative: 5 %
Neutro Abs: 2.5 10*3/uL (ref 1.7–7.7)
Neutrophils Relative %: 67 %
Platelets: 104 10*3/uL — ABNORMAL LOW (ref 150–400)
RBC: 2.56 MIL/uL — ABNORMAL LOW (ref 3.87–5.11)
RDW: 14.5 % (ref 11.5–15.5)
WBC: 3.6 10*3/uL — ABNORMAL LOW (ref 4.0–10.5)
nRBC: 0 % (ref 0.0–0.2)

## 2023-01-12 LAB — GLUCOSE, CAPILLARY
Glucose-Capillary: 83 mg/dL (ref 70–99)
Glucose-Capillary: 121 mg/dL — ABNORMAL HIGH (ref 70–99)
Glucose-Capillary: 86 mg/dL (ref 70–99)
Glucose-Capillary: 95 mg/dL (ref 70–99)

## 2023-01-12 LAB — COMPREHENSIVE METABOLIC PANEL WITH GFR
ALT: 41 U/L (ref 0–44)
AST: 40 U/L (ref 15–41)
Albumin: 2.8 g/dL — ABNORMAL LOW (ref 3.5–5.0)
Alkaline Phosphatase: 34 U/L — ABNORMAL LOW (ref 38–126)
Anion gap: 8 (ref 5–15)
BUN: 8 mg/dL (ref 6–20)
CO2: 23 mmol/L (ref 22–32)
Calcium: 8.1 mg/dL — ABNORMAL LOW (ref 8.9–10.3)
Chloride: 105 mmol/L (ref 98–111)
Creatinine, Ser: 0.44 mg/dL (ref 0.44–1.00)
GFR, Estimated: >60 mL/min (ref >60)
Glucose, Bld: 93 mg/dL (ref 70–99)
Potassium: 3.8 mmol/L (ref 3.5–5.1)
Sodium: 136 mmol/L (ref 135–145)
Total Bilirubin: 0.8 mg/dL (ref 0.3–1.2)
Total Protein: 5.9 g/dL — ABNORMAL LOW (ref 6.5–8.1)

## 2023-01-12 LAB — MAGNESIUM: Magnesium: 2.2 mg/dL (ref 1.7–2.4)

## 2023-01-12 MED ORDER — SODIUM CHLORIDE 0.9 % IV SOLN: INTRAVENOUS | Status: DC

## 2023-01-12 NOTE — Progress Notes (Signed)
PROGRESS NOTE    Gabriela Sullivan  WUX:[id number] DOB: Jun 07, 1990 DOA: 01/09/2023 PCP: Leane Call, MD    Brief Narrative:  32 year old female with history of breast cancer status post bilateral mastectomy, metastatic disease and currently on abemaciclib and anastrozole presented with abdominal pain, nausea, low-grade fever for 2 weeks, recent chemotherapy followed by worsening nausea vomiting and diarrhea.  Admitted due to pancytopenia, fever.   Assessment & Plan:  Sepsis present on admission Unknown etiology, may be related to fever of malignancy Currently still febrile, with resolving leukopenia, ANC 2.5 UA was normal BC x 2 NGTD CT chest with no evidence of PE, radiation changes anterior left upper lobe, no evidence of pneumonia CT abdomen pelvis with contrast on 01/05/2023 with no acute findings Continue IV fluids Continue cefepime and Flagyl  Oncology consulted Monitor closely  Normocytic anemia Anemia of chronic disease Likely chemotherapy related Anemia panel pending No obvious signs of bleeding Daily CBC  Metastatic breast cancer Follows Dr Candise Che with oncology Continue Anastrozole, hold abemaciclib    DVT prophylaxis: SCDs Start: 01/10/23 0647   Code Status: Full code Family Communication: Parents at the bedside Disposition Plan: Status is: Inpatient Remains inpatient appropriate because: Persistent fever, immunocompromised patient     Consultants:  Oncology  Procedures:  None  Antimicrobials:  Cefepime and Flagyl 8/9   Subjective: Pt reports feeling ok, able to keep food down without nausea and seems happy about it. Parents at bedside   Objective: Vitals:   01/11/23 2340 01/12/23 0154 01/12/23 0628 01/12/23 1417  BP:  (!) 99/53 102/60 (!) 94/56  Pulse:  84 79 82  Resp:  18 17 15   Temp: (!) 100.7 F (38.2 C) 98.5 F (36.9 C) 98.4 F (36.9 C) 98.5 F (36.9 C)  TempSrc: Oral Oral Oral Oral  SpO2:  99% 98% 100%    Intake/Output Summary  (Last 24 hours) at 01/12/2023 1456 Last data filed at 01/12/2023 1418 Gross per 24 hour  Intake 480 ml  Output --  Net 480 ml   There were no vitals filed for this visit.  Examination: General: NAD  Cardiovascular: S1, S2 present Respiratory: CTAB Abdomen: Soft, nontender, nondistended, bowel sounds present Musculoskeletal: Trace bilateral pedal edema noted Skin: Normal Psychiatry: Normal mood     Data Reviewed: I have personally reviewed following labs and imaging studies  CBC: Recent Labs  Lab 01/05/23 2032 01/09/23 2205 01/10/23 0647 01/11/23 1027 01/12/23 0500  WBC 2.4* 3.0* 2.4* 3.8* 3.6*  NEUTROABS 1.5* 1.7  --  2.7 2.5  HGB 10.4* 8.8* 9.0* 8.2* 8.6*  HCT 28.5* 25.0* 25.8* 23.4* 25.5*  MCV 94.7 96.9 98.1 99.6 99.6  PLT 154 137* 106* 101* 104*   Basic Metabolic Panel: Recent Labs  Lab 01/05/23 2032 01/09/23 2205 01/10/23 0647 01/11/23 1027 01/12/23 0500  NA 135 131*  --  135 136  K 2.8* 3.2*  --  3.0* 3.8  CL 101 100  --  103 105  CO2 26 23  --  24 23  GLUCOSE 98 99  --  113* 93  BUN 12 13  --  6 8  CREATININE 0.68 0.67 0.63 0.57 0.44  CALCIUM 8.2* 8.1*  --  7.8* 8.1*  MG  --  2.3  --  2.1 2.2  PHOS  --   --   --  2.6 2.4*   GFR: Estimated Creatinine Clearance: 95.8 mL/min (by C-G formula based on SCr of 0.44 mg/dL). Liver Function Tests: Recent Labs  Lab 01/05/23 2032  01/09/23 2205 01/11/23 1027 01/12/23 0500  AST 47* 64* 51* 40  ALT 38 50* 45* 41  ALKPHOS 42 38 34* 34*  BILITOT 2.0* 1.3* 1.2 0.8  PROT 7.2 7.0 5.8* 5.9*  ALBUMIN 3.9 3.7 2.7* 2.8*   Recent Labs  Lab 01/05/23 2032 01/09/23 2205  LIPASE 60* 72*   No results for input(s): "AMMONIA" in the last 168 hours. Coagulation Profile: Recent Labs  Lab 01/09/23 2205  INR 1.2   Cardiac Enzymes: No results for input(s): "CKTOTAL", "CKMB", "CKMBINDEX", "TROPONINI" in the last 168 hours. BNP (last 3 results) No results for input(s): "PROBNP" in the last 8760  hours. HbA1C: Recent Labs    01/10/23 0658  HGBA1C 4.1*   CBG: Recent Labs  Lab 01/11/23 1256 01/11/23 1620 01/11/23 2044 01/12/23 0745 01/12/23 1207  GLUCAP 89 85 99 83 121*   Lipid Profile: No results for input(s): "CHOL", "HDL", "LDLCALC", "TRIG", "CHOLHDL", "LDLDIRECT" in the last 72 hours. Thyroid Function Tests: No results for input(s): "TSH", "T4TOTAL", "FREET4", "T3FREE", "THYROIDAB" in the last 72 hours. Anemia Panel: No results for input(s): "VITAMINB12", "FOLATE", "FERRITIN", "TIBC", "IRON", "RETICCTPCT" in the last 72 hours. Sepsis Labs: Recent Labs  Lab 01/09/23 2224 01/10/23 0705  LATICACIDVEN 0.5 0.5    Recent Results (from the past 240 hour(s))  Resp panel by RT-PCR (RSV, Flu A&B, Covid) Anterior Nasal Swab     Status: None   Collection Time: 01/05/23  7:31 PM   Specimen: Anterior Nasal Swab  Result Value Ref Range Status   SARS Coronavirus 2 by RT PCR NEGATIVE NEGATIVE Final    Comment: (NOTE) SARS-CoV-2 target nucleic acids are NOT DETECTED.  The SARS-CoV-2 RNA is generally detectable in upper respiratory specimens during the acute phase of infection. The lowest concentration of SARS-CoV-2 viral copies this assay can detect is 138 copies/mL. A negative result does not preclude SARS-Cov-2 infection and should not be used as the sole basis for treatment or other patient management decisions. A negative result may occur with  improper specimen collection/handling, submission of specimen other than nasopharyngeal swab, presence of viral mutation(s) within the areas targeted by this assay, and inadequate number of viral copies(<138 copies/mL). A negative result must be combined with clinical observations, patient history, and epidemiological information. The expected result is Negative.  Fact Sheet for Patients:  BloggerCourse.com  Fact Sheet for Healthcare Providers:  SeriousBroker.it  This test  is no t yet approved or cleared by the [place] FDA and  has been authorized for detection and/or diagnosis of SARS-CoV-2 by FDA under an Emergency Use Authorization (EUA). This EUA will remain  in effect (meaning this test can be used) for the duration of the COVID-19 declaration under Section 564(b)(1) of the Act, 21 U.S.C.section 360bbb-3(b)(1), unless the authorization is terminated  or revoked sooner.       Influenza A by PCR NEGATIVE NEGATIVE Final   Influenza B by PCR NEGATIVE NEGATIVE Final    Comment: (NOTE) The Xpert Xpress SARS-CoV-2/FLU/RSV plus assay is intended as an aid in the diagnosis of influenza from Nasopharyngeal swab specimens and should not be used as a sole basis for treatment. Nasal washings and aspirates are unacceptable for Xpert Xpress SARS-CoV-2/FLU/RSV testing.  Fact Sheet for Patients: BloggerCourse.com  Fact Sheet for Healthcare Providers: SeriousBroker.it  This test is not yet approved or cleared by the [place] FDA and has been authorized for detection and/or diagnosis of SARS-CoV-2 by FDA under an Emergency Use Authorization (EUA). This EUA will remain in  effect (meaning this test can be used) for the duration of the COVID-19 declaration under Section 564(b)(1) of the Act, 21 U.S.C. section 360bbb-3(b)(1), unless the authorization is terminated or revoked.     Resp Syncytial Virus by PCR NEGATIVE NEGATIVE Final    Comment: (NOTE) Fact Sheet for Patients: BloggerCourse.com  Fact Sheet for Healthcare Providers: SeriousBroker.it  This test is not yet approved or cleared by the [place] FDA and has been authorized for detection and/or diagnosis of SARS-CoV-2 by FDA under an Emergency Use Authorization (EUA). This EUA will remain in effect (meaning this test can be used) for the duration of the COVID-19 declaration under Section  564(b)(1) of the Act, 21 U.S.C. section 360bbb-3(b)(1), unless the authorization is terminated or revoked.  Performed at [facility], [address] [place], [place] [address]   Blood Culture (routine x 2)     Status: None (Preliminary result)   Collection Time: 01/09/23 10:05 PM   Specimen: BLOOD  Result Value Ref Range Status   Specimen Description   Final    BLOOD PORTA CATH Performed at [facility], [address], [place], [place] [address]    Special Requests   Final    BOTTLES DRAWN AEROBIC AND ANAEROBIC Blood Culture results may not be optimal due to an excessive volume of blood received in culture bottles Performed at [facility], [address], [place], [place] [address]    Culture   Final    NO GROWTH 3 DAYS Performed at [facility], [address] [place], [place] [address]    Report Status PENDING  Incomplete  Blood Culture (routine x 2)     Status: None (Preliminary result)   Collection Time: 01/09/23 11:55 PM   Specimen: BLOOD RIGHT HAND  Result Value Ref Range Status   Specimen Description   Final    BLOOD RIGHT HAND Performed at [facility], [address], [place], [place] [address]    Special Requests   Final    BOTTLES DRAWN AEROBIC AND ANAEROBIC Blood Culture adequate volume Performed at [facility], [address] [place], [place] [address]    Culture   Final    NO GROWTH 2 DAYS Performed at [facility], [address], [place], [place] [address]    Report Status PENDING  Incomplete         Radiology Studies: No results found.      Scheduled Meds:  anastrozole  1 mg Oral Daily   calcium carbonate  2 tablet Oral QODAY   Chlorhexidine Gluconate Cloth  6 each Topical Daily   insulin aspart  0-15 Units Subcutaneous TID WC   pantoprazole  40 mg Oral Daily   potassium chloride  10 mEq Oral Daily   sodium chloride flush  3 mL Intravenous  Q12H   Vitamin D (Ergocalciferol)  50,000 Units Oral Weekly   Continuous Infusions:  ceFEPime (MAXIPIME) IV 2 g (01/12/23 0813)   metronidazole 500 mg (01/12/23 0858)     LOS: 2 days     Briant Cedar, MD [facility]

## 2023-01-12 NOTE — Plan of Care (Signed)
  Problem: Fluid Volume: Goal: Hemodynamic stability will improve 01/12/2023 0520 by Elbert Ewings, RN Outcome: Progressing 01/12/2023 0520 by Elbert Ewings, RN Outcome: Progressing   Problem: Clinical Measurements: Goal: Diagnostic test results will improve Outcome: Progressing Goal: Signs and symptoms of infection will decrease Outcome: Progressing   Problem: Respiratory: Goal: Ability to maintain adequate ventilation will improve Outcome: Progressing   Problem: Education: Goal: Ability to describe self-care measures that may prevent or decrease complications (Diabetes Survival Skills Education) will improve Outcome: Progressing Goal: Individualized Educational Video(s) Outcome: Progressing   Problem: Coping: Goal: Ability to adjust to condition or change in health will improve Outcome: Progressing   Problem: Fluid Volume: Goal: Ability to maintain a balanced intake and output will improve Outcome: Progressing   Problem: Health Behavior/Discharge Planning: Goal: Ability to identify and utilize available resources and services will improve Outcome: Progressing Goal: Ability to manage health-related needs will improve Outcome: Progressing   Problem: Metabolic: Goal: Ability to maintain appropriate glucose levels will improve Outcome: Progressing   Problem: Nutritional: Goal: Maintenance of adequate nutrition will improve Outcome: Progressing Goal: Progress toward achieving an optimal weight will improve Outcome: Progressing   Problem: Skin Integrity: Goal: Risk for impaired skin integrity will decrease Outcome: Progressing   Problem: Tissue Perfusion: Goal: Adequacy of tissue perfusion will improve Outcome: Progressing   Problem: Education: Goal: Knowledge of General Education information will improve Description: Including pain rating scale, medication(s)/side effects and non-pharmacologic comfort measures Outcome: Progressing   Problem: Health  Behavior/Discharge Planning: Goal: Ability to manage health-related needs will improve Outcome: Progressing   Problem: Clinical Measurements: Goal: Ability to maintain clinical measurements within normal limits will improve Outcome: Progressing Goal: Will remain free from infection Outcome: Progressing Goal: Diagnostic test results will improve Outcome: Progressing Goal: Respiratory complications will improve Outcome: Progressing Goal: Cardiovascular complication will be avoided Outcome: Progressing   Problem: Activity: Goal: Risk for activity intolerance will decrease Outcome: Progressing   Problem: Nutrition: Goal: Adequate nutrition will be maintained Outcome: Progressing   Problem: Coping: Goal: Level of anxiety will decrease Outcome: Progressing   Problem: Elimination: Goal: Will not experience complications related to bowel motility Outcome: Progressing Goal: Will not experience complications related to urinary retention Outcome: Progressing   Problem: Pain Managment: Goal: General experience of comfort will improve Outcome: Progressing   Problem: Safety: Goal: Ability to remain free from injury will improve Outcome: Progressing   Problem: Skin Integrity: Goal: Risk for impaired skin integrity will decrease Outcome: Progressing

## 2023-01-13 DIAGNOSIS — A419 Sepsis, unspecified organism: Secondary | ICD-10-CM | POA: Diagnosis not present

## 2023-01-13 LAB — GLUCOSE, CAPILLARY
Glucose-Capillary: 84 mg/dL (ref 70–99)
Glucose-Capillary: 106 mg/dL — ABNORMAL HIGH (ref 70–99)

## 2023-01-13 MED ORDER — HEPARIN SOD (PORK) LOCK FLUSH 100 UNIT/ML IV SOLN
500.0000 [IU] | Freq: Once | INTRAVENOUS | Status: AC
  Administered 2023-01-13: 500 [IU] via INTRAVENOUS
  Filled 2023-01-13: qty 5

## 2023-01-13 MED ORDER — CEFDINIR 300 MG PO CAPS: 300.0000 mg | ORAL_CAPSULE | Freq: Two times a day (BID) | ORAL | 0 refills | Status: DC

## 2023-01-13 MED ORDER — CEFDINIR 300 MG PO CAPS
300.0000 mg | ORAL_CAPSULE | Freq: Two times a day (BID) | ORAL | 0 refills | Status: DC
  Filled 2023-01-13: qty 6, 3d supply, fill #0

## 2023-01-13 MED ORDER — METRONIDAZOLE 500 MG PO TABS
500.0000 mg | ORAL_TABLET | Freq: Two times a day (BID) | ORAL | 0 refills | Status: DC
  Filled 2023-01-13: qty 6, 3d supply, fill #0

## 2023-01-13 MED ORDER — METRONIDAZOLE 500 MG PO TABS: 500.0000 mg | ORAL_TABLET | Freq: Two times a day (BID) | ORAL | 0 refills | Status: DC

## 2023-01-13 MED ORDER — POTASSIUM CHLORIDE CRYS ER 20 MEQ PO TBCR
40.0000 meq | EXTENDED_RELEASE_TABLET | Freq: Once | ORAL | Status: AC
  Administered 2023-01-13: 40 meq via ORAL
  Filled 2023-01-13: qty 2

## 2023-01-13 NOTE — Plan of Care (Signed)

## 2023-01-13 NOTE — Progress Notes (Signed)
Pharmacy Antibiotic Note  Gabriela Sullivan is a 32 y.o. female admitted on 01/09/2023 with past medical history significant for breast cancer, migraines, PCOS, asthma presents to the ED complaining of abdominal pain. Pharmacy has been consulted to dose cefepime for sepsis.  Day 4 total abx: Patient continues to fever (T 100.7 on 8/11 AM), WBC and ANC both continue to decline since 8/10--currently 3.1 and 2. Scr stable at 0.45.   Plan: Continue cefepime 2g IV q8hrs  Continue Flagyl 500mg  IV q12hrs  Follow renal function,cultures and clinical course   Temp (24hrs), Avg:99.4 F (37.4 C), Min:98.5 F (36.9 C), Max:100 F (37.8 C)  Recent Labs  Lab 01/09/23 2205 01/09/23 2224 01/10/23 0647 01/10/23 0705 01/11/23 1027 01/12/23 0500 01/13/23 0530  WBC 3.0*  --  2.4*  --  3.8* 3.6* 3.1*  CREATININE 0.67  --  0.63  --  0.57 0.44 0.45  LATICACIDVEN  --  0.5  --  0.5  --   --   --     Estimated Creatinine Clearance: 95.8 mL/min (by C-G formula based on SCr of 0.45 mg/dL).    No Known Allergies  Antimicrobials this admission: 8/9 vanc 1000mg  x1 8/9 zosyn 3.375g x1 8/9 Flagyl >> 8/9 cefepime >>  Dose adjustments this admission: N/A  Microbiology results: 8/8 BCx: NG x4 days   Thank you for allowing pharmacy to be a part of this patient's care.  Cherylin Mylar, PharmD Clinical Pharmacist  8/12/20248:38 AM

## 2023-01-13 NOTE — Plan of Care (Signed)
Alert and oriented x 4. Free of falls and other injuries. Vital signs are stable. Awaiting discharge.   Problem: Clinical Measurements: Goal: Signs and symptoms of infection will decrease Outcome: Adequate for Discharge   Problem: Education: Goal: Ability to describe self-care measures that may prevent or decrease complications (Diabetes Survival Skills Education) will improve Outcome: Adequate for Discharge   Problem: Coping: Goal: Ability to adjust to condition or change in health will improve Outcome: Adequate for Discharge

## 2023-01-13 NOTE — Discharge Summary (Addendum)
Physician Discharge Summary   Patient: Gabriela Sullivan MRN: [id number] DOB: 1990-09-24  Admit date:     01/09/2023  Discharge date: 01/13/23  Discharge Physician: Briant Cedar   PCP: Leane Call, MD   Recommendations at discharge:   Follow-up with PCP in 1 week with repeat labs Follow-up with Dr. Pamelia Hoit, oncology  Discharge Diagnoses: Principal Problem:   Sepsis [facility]) Active Problems:   Malignant neoplasm of upper-outer quadrant of left breast in female, estrogen receptor positive (HCC)   Lytic bone lesions on xray   Nonalcoholic steatohepatitis   Type 2 diabetes mellitus (HCC)   Pancytopenia (HCC)   Febrile neutropenia (HCC)   Hypokalemia   Hyponatremia   Hypocalcemia   Hyperbilirubinemia   Elevated lipase   Nausea, vomiting and diarrhea   Transaminitis    Hospital Course: 32 year old female with history of breast cancer status post bilateral mastectomy, metastatic disease and currently on abemaciclib and anastrozole presented with abdominal pain, nausea, low-grade fever for 2 weeks, recent chemotherapy followed by worsening nausea vomiting and diarrhea.  Admitted due to pancytopenia, fever.    Today, patient reports feeling much better, denies any abdominal pain, nausea/vomiting, fever/chills.  Has been able to ambulate the hallway without any issues.  Very eager to be discharged.  Discussed with Dr. Pamelia Hoit who recommended to complete 7 days of antibiotics and follow-up with him.   Assessment and Plan:  ??Sepsis present on admission Unknown etiology, may be related to fever of malignancy Currently afebrile, with leukopenia UA was normal BC x 2 NGTD CT chest with no evidence of PE, radiation changes anterior left upper lobe, no evidence of pneumonia CT abdomen pelvis with contrast on 01/05/2023 with no acute findings S/p IV fluids S/P IV cefepime and Flagyl X 4 days, switched to PO cefdinir and flagyl X 3 days to complete 7 days Follow up with PCP and  oncology   Normocytic anemia Anemia of chronic disease Likely chemotherapy related Anemia panel fairly stable No obvious signs of bleeding Follow up with PCP and oncology  Hypokalemia Repleted prn  Metastatic breast cancer Follows Dr Candise Che with oncology Continue Anastrozole, hold abemaciclib      Consultants: Oncology Procedures performed: None Disposition: Home Diet recommendation:  Carb modified diet   DISCHARGE MEDICATION: Allergies as of 01/13/2023   No Known Allergies      Medication List     STOP taking these medications    potassium chloride SA 20 MEQ tablet Commonly known as: KLOR-CON M   traMADol 50 MG tablet Commonly known as: ULTRAM       TAKE these medications    anastrozole 1 MG tablet Commonly known as: ARIMIDEX Take 1 tablet (1 mg total) by mouth daily.   calcium carbonate 500 MG chewable tablet Commonly known as: TUMS - dosed in mg elemental calcium Chew 2 tablets by mouth every other day.   cefdinir 300 MG capsule Commonly known as: OMNICEF Take 1 capsule (300 mg total) by mouth 2 (two) times daily for 3 days. Start taking on: January 14, 2023   metroNIDAZOLE 500 MG tablet Commonly known as: FLAGYL Take 1 tablet (500 mg total) by mouth 2 (two) times daily for 3 days. Start taking on: January 14, 2023   Mounjaro 5 MG/0.5ML Pen Generic drug: tirzepatide Inject 5 mg into the skin once a week.   omeprazole 20 MG capsule Commonly known as: PRILOSEC Take 1 capsule (20 mg total) by mouth daily.   ondansetron 4 MG disintegrating tablet Commonly known  as: ZOFRAN-ODT Take 1 tablet (4 mg total) by mouth every 8 (eight) hours as needed for nausea or vomiting.   potassium chloride 10 MEQ tablet Commonly known as: KLOR-CON Take 1 tablet (10 mEq total) by mouth daily.   Verzenio 100 MG tablet Generic drug: abemaciclib Take 1 tablet (100 mg total) by mouth 2 (two) times daily. Swallow tablets whole. Do not chew, crush, or split tablets  before swallowing.   Vitamin D (Ergocalciferol) 1.25 MG (50000 UNIT) Caps capsule Commonly known as: DRISDOL Take 50,000 Units by mouth once a week.        Follow-up Information     Leane Call, MD. Schedule an appointment as soon as possible for a visit in 1 week(s).   Specialty: Family Medicine Contact information: [address] [place] [address] [phone]                Discharge Exam: General: NAD  Cardiovascular: S1, S2 present Respiratory: CTAB Abdomen: Soft, nontender, nondistended, bowel sounds present Musculoskeletal: No bilateral pedal edema noted Skin: Normal Psychiatry: Normal mood    Condition at discharge: stable  The results of significant diagnostics from this hospitalization (including imaging, microbiology, ancillary and laboratory) are listed below for reference.   Imaging Studies: CT Angio Chest PE W/Cm &/Or Wo Cm  Result Date: 01/10/2023 CLINICAL DATA:  Right breast cancer, status post bilateral mastectomy. Fever and flank pain. Evaluate for PE. EXAM: CT ANGIOGRAPHY CHEST WITH CONTRAST TECHNIQUE: Multidetector CT imaging of the chest was performed using the standard protocol during bolus administration of intravenous contrast. Multiplanar CT image reconstructions and MIPs were obtained to evaluate the vascular anatomy. RADIATION DOSE REDUCTION: This exam was performed according to the departmental dose-optimization program which includes automated exposure control, adjustment of the mA and/or kV according to patient size and/or use of iterative reconstruction technique. CONTRAST:  75mL OMNIPAQUE IOHEXOL 350 MG/ML SOLN COMPARISON:  CT chest dated 12/31/2022. FINDINGS: Cardiovascular: Satisfactory opacification of the bilateral pulmonary arteries to the lobar level. No evidence of pulmonary embolism. Although not tailored for evaluation of the thoracic aorta, there is no evidence of thoracic aortic aneurysm or dissection. Heart is  normal in size.  No pericardial effusion. Right chest port terminates in the upper right atrium. Mediastinum/Nodes: No suspicious mediastinal lymphadenopathy. Status post left axillary lymph node dissection with residual 7 mm short axis left axillary node (series 4/image 44), suspicious. Lungs/Pleura: Radiation changes in the anterior left upper lobe. No suspicious pulmonary nodules. No focal consolidation. No pleural effusion or pneumothorax. Upper Abdomen: Visualized upper abdomen is grossly unremarkable. Musculoskeletal: Status post bilateral mastectomy. Visualized osseous structures are within normal limits. Review of the MIP images confirms the above findings. IMPRESSION: No evidence of pulmonary embolism. Status post bilateral mastectomy with left axillary lymph node dissection. Radiation changes in the anterior left upper lobe. Residual 7 mm short axis left axillary node, unchanged from recent CT, suspicious for nodal metastasis. Electronically Signed   By: Charline Bills M.D.   On: 01/10/2023 03:35   DG Chest Port 1 View  Result Date: 01/09/2023 CLINICAL DATA:  Questionable sepsis EXAM: PORTABLE CHEST 1 VIEW COMPARISON:  Chest x-ray 620 1,002 FINDINGS: Right chest port catheter tip ends in the SVC. The heart size and mediastinal contours are within normal limits. Both lungs are clear. The visualized skeletal structures are unremarkable. IMPRESSION: No active disease. Electronically Signed   By: Darliss Cheney M.D.   On: 01/09/2023 21:59   CT ABDOMEN PELVIS W  CONTRAST  Result Date: 01/05/2023 CLINICAL DATA:  Abdominal pain, nausea, vomiting EXAM: CT ABDOMEN AND PELVIS WITH CONTRAST TECHNIQUE: Multidetector CT imaging of the abdomen and pelvis was performed using the standard protocol following bolus administration of intravenous contrast. RADIATION DOSE REDUCTION: This exam was performed according to the departmental dose-optimization program which includes automated exposure control, adjustment of  the mA and/or kV according to patient size and/or use of iterative reconstruction technique. CONTRAST:  OMNIPAQUE IOHEXOL 300 MG/ML  SOLN COMPARISON:  12/31/2022 FINDINGS: Lower chest: No acute abnormality Hepatobiliary: No focal hepatic abnormality. Gallbladder unremarkable. Pancreas: No focal abnormality or ductal dilatation. Spleen: No focal abnormality.  Normal size. Adrenals/Urinary Tract: No adrenal abnormality. No focal renal abnormality. No stones or hydronephrosis. Urinary bladder is unremarkable. Stomach/Bowel: Stomach, large and small bowel grossly unremarkable. Normal appendix. Vascular/Lymphatic: No evidence of aneurysm or adenopathy. Reproductive: Uterus and adnexa unremarkable.  No mass. Other: No free fluid or free air. Musculoskeletal: Scattered lucent lesions within T11, L2 and L5 vertebral bodies as well as the right pelvis and left iliac bone. Mixed lytic and sclerotic sacral lesion. These are stable since prior study. IMPRESSION: No acute findings in the abdomen or pelvis. Stable osseous metastatic disease. Electronically Signed   By: Charlett Nose M.D.   On: 01/05/2023 21:48   CT Head Wo Contrast  Result Date: 01/05/2023 CLINICAL DATA:  Sudden severe headache. Nausea and vomiting for 1 week. EXAM: CT HEAD WITHOUT CONTRAST TECHNIQUE: Contiguous axial images were obtained from the base of the skull through the vertex without intravenous contrast. RADIATION DOSE REDUCTION: This exam was performed according to the departmental dose-optimization program which includes automated exposure control, adjustment of the mA and/or kV according to patient size and/or use of iterative reconstruction technique. COMPARISON:  CT brain 08/04/2021 FINDINGS: Brain: The ventricles are normal in size and configuration. The basilar cisterns are patent. No mass, mass effect, or midline shift. No acute intracranial hemorrhage is seen. No abnormal extra-axial fluid collection. Preservation of the normal cortical  gray-white interface without CT evidence of an acute major vascular territorial cortical based infarction. Vascular: No hyperdense vessel or unexpected calcification. Skull: Normal. Negative for fracture or focal lesion. Sinuses/Orbits: The visualized orbits are unremarkable. There is a mild-to-moderate inferior right maxillary sinus mucosal polyp. This is grossly similar to prior on the available comparison study in which less of the right maxillary sinuses imaged. Otherwise, the visualized paranasal sinuses and mastoid air cells are clear. No acute calvarial fracture is seen. Other: None. IMPRESSION: 1. No acute intracranial process is seen. 2. Mild-to-moderate inferior right maxillary sinus mucosal polyp. Electronically Signed   By: Neita Garnet M.D.   On: 01/05/2023 20:28   CT CHEST ABDOMEN PELVIS W CONTRAST  Result Date: 12/31/2022 CLINICAL DATA:  Metastatic breast cancer, follow-up. * Tracking Code: BO *. EXAM: CT CHEST, ABDOMEN, AND PELVIS WITH CONTRAST TECHNIQUE: Multidetector CT imaging of the chest, abdomen and pelvis was performed following the standard protocol during bolus administration of intravenous contrast. RADIATION DOSE REDUCTION: This exam was performed according to the departmental dose-optimization program which includes automated exposure control, adjustment of the mA and/or kV according to patient size and/or use of iterative reconstruction technique. CONTRAST:  OMNIPAQUE IOHEXOL 300 MG/ML  SOLN COMPARISON:  Multiple priors including CT July 01, 2022. FINDINGS: CT CHEST FINDINGS Cardiovascular: Accessed right chest Port-A-Cath with tip at the superior cavoatrial junction. Normal caliber abdominal aorta. No central pulmonary embolus on this nondedicated study. Normal size heart. No significant pericardial  effusion/thickening. Mediastinum/Nodes: No suspicious thyroid nodule. Stable 8 mm short axis left axillary lymph node on image 27/2. Prior left axillary lymph node dissection  No pathologically enlarged mediastinal or hilar lymph nodes. Lungs/Pleura: Postradiation change in the anterior left upper lobe. No suspicious pulmonary nodules or masses. No pleural effusion. No pneumothorax Musculoskeletal: Similar appearance of the osseous metastasis including: Stable subtle lucent sternal lesion. Stable lucency in the lateral left T11 vertebral body stable sclerotic focus in the anterior T10 vertebral body. No new suspicious lytic or blastic lesion of bone identified. CT ABDOMEN PELVIS FINDINGS Hepatobiliary: No suspicious hepatic lesion gallbladder is unremarkable. No biliary ductal dilation Pancreas: No pancreatic ductal dilation or evidence of acute inflammation. Spleen: No splenomegaly. Adrenals/Urinary Tract: Bilateral adrenal glands appear normal. No hydronephrosis. Kidneys demonstrate symmetric enhancement Stomach/Bowel: Stomach is unremarkable for degree of distension. No pathologic dilation of small or large bowel. No evidence of acute bowel inflammation. Vascular/Lymphatic: Normal caliber abdominal aorta. Smooth IVC contours. The portal, splenic and superior mesenteric veins are patent. No pathologically enlarged abdominal or pelvic lymph nodes. Reproductive: Uterus and bilateral adnexa are unremarkable. Other: No significant abdominopelvic free fluid Musculoskeletal: No significant interval change in the osseous metastasis including: Lucent lesions at L2 and L5 within the right ischium and right ilium. Additional lucent and sclerotic lesions in the right hemi sacrum and left iliac bone posteriorly no new aggressive lytic or blastic lesion of bone IMPRESSION: 1. No significant interval change in the osseous metastasis. 2. No evidence of new or progressive metastatic disease in the chest, abdomen or pelvis. 3. Stable 8 mm short axis left axillary lymph node. Electronically Signed   By: Maudry Mayhew M.D.   On: 12/31/2022 10:31    Microbiology: Results for orders placed or performed  during the hospital encounter of 01/09/23  Blood Culture (routine x 2)     Status: None (Preliminary result)   Collection Time: 01/09/23 10:05 PM   Specimen: BLOOD  Result Value Ref Range Status   Specimen Description   Final    BLOOD PORTA CATH Performed at [facility], [address], [place], [place] [address]    Special Requests   Final    BOTTLES DRAWN AEROBIC AND ANAEROBIC Blood Culture results may not be optimal due to an excessive volume of blood received in culture bottles Performed at [facility], [address] [place], [place] [address]    Culture   Final    NO GROWTH 4 DAYS Performed at [facility], [address] [place], [place] [address]    Report Status PENDING  Incomplete  Blood Culture (routine x 2)     Status: None (Preliminary result)   Collection Time: 01/09/23 11:55 PM   Specimen: BLOOD RIGHT HAND  Result Value Ref Range Status   Specimen Description   Final    BLOOD RIGHT HAND Performed at [facility], [address], [place], [place] [address]    Special Requests   Final    BOTTLES DRAWN AEROBIC AND ANAEROBIC Blood Culture adequate volume Performed at [facility], [address] [place], [place] [address]    Culture   Final    NO GROWTH 3 DAYS Performed at [facility], [address], [place], [place] [address]    Report Status PENDING  Incomplete    Labs: CBC: Recent Labs  Lab 01/09/23 2205 01/10/23 0647 01/11/23 1027 01/12/23 0500 01/13/23 0530  WBC 3.0* 2.4* 3.8* 3.6*  3.1*  NEUTROABS 1.7  --  2.7 2.5 2.0  HGB 8.8* 9.0* 8.2* 8.6* 8.2*  HCT 25.0* 25.8* 23.4* 25.5* 24.3*  MCV 96.9 98.1 99.6 99.6 99.6  PLT 137* 106* 101* 104* 88*   Basic Metabolic Panel: Recent Labs  Lab 01/09/23 2205 01/10/23 0647 01/11/23 1027 01/12/23 0500 01/13/23 0530  NA 131*  --  135 136 134*  K 3.2*  --  3.0* 3.8 3.3*  CL 100  --  103 105 105  CO2 23  --  24 23 24    GLUCOSE 99  --  113* 93 90  BUN 13  --  6 8 8   CREATININE 0.67 0.63 0.57 0.44 0.45  CALCIUM 8.1*  --  7.8* 8.1* 7.7*  MG 2.3  --  2.1 2.2  --   PHOS  --   --  2.6 2.4*  --    Liver Function Tests: Recent Labs  Lab 01/09/23 2205 01/11/23 1027 01/12/23 0500 01/13/23 0530  AST 64* 51* 40 37  ALT 50* 45* 41 37  ALKPHOS 38 34* 34* 37*  BILITOT 1.3* 1.2 0.8 0.8  PROT 7.0 5.8* 5.9* 5.7*  ALBUMIN 3.7 2.7* 2.8* 2.9*   CBG: Recent Labs  Lab 01/12/23 1207 01/12/23 1642 01/12/23 2230 01/13/23 0738 01/13/23 1129  GLUCAP 121* 86 95 84 106*    Discharge time spent: greater than 30 minutes.  Signed: Briant Cedar, MD [facility] 01/13/2023

## 2023-01-14 ENCOUNTER — Other Ambulatory Visit (HOSPITAL_COMMUNITY): Payer: Self-pay

## 2023-01-15 ENCOUNTER — Telehealth: Payer: Self-pay

## 2023-01-15 NOTE — Telephone Encounter (Signed)
Received call form Luther Parody, CMA at [facility] requesting most recent OV note as pt has not been seen by Dr Talbert Forest since 2022. OV note from 01/06/23 faxed to [phone]. Confirmation received.

## 2023-01-17 ENCOUNTER — Encounter: Payer: Self-pay | Admitting: Gastroenterology

## 2023-01-17 ENCOUNTER — Ambulatory Visit (INDEPENDENT_AMBULATORY_CARE_PROVIDER_SITE_OTHER): Payer: Medicaid Other | Admitting: Gastroenterology

## 2023-01-17 VITALS — BP 110/60 | HR 96 | Ht 60.5 in | Wt 177.0 lb

## 2023-01-17 DIAGNOSIS — R748 Abnormal levels of other serum enzymes: Secondary | ICD-10-CM

## 2023-01-17 DIAGNOSIS — K7581 Nonalcoholic steatohepatitis (NASH): Secondary | ICD-10-CM

## 2023-01-17 DIAGNOSIS — C50919 Malignant neoplasm of unspecified site of unspecified female breast: Secondary | ICD-10-CM | POA: Diagnosis not present

## 2023-01-17 NOTE — Patient Instructions (Addendum)
_______________________________________________________  If your blood pressure at your visit was 140/90 or greater, please contact your primary care physician to follow up on this.  _______________________________________________________  If you are age 33 or older, your body mass index should be between 23-30. Your Body mass index is 34 kg/m. If this is out of the aforementioned range listed, please consider follow up with your Primary Care Provider.  If you are age 22 or younger, your body mass index should be between 19-25. Your Body mass index is 34 kg/m. If this is out of the aformentioned range listed, please consider follow up with your Primary Care Provider.   ________________________________________________________  The [facility] providers would like to encourage you to use [facility] to communicate with providers for non-urgent requests or questions.  Due to long hold times on the telephone, sending your provider a message by [facility] may be a faster and more efficient way to get a response.  Please allow 48 business hours for a response.  Please remember that this is for non-urgent requests.  _______________________________________________________  Bonita Quin will be due for labs (LFTs) in 6 months.  Please follow up in 1 year.   Continue Mounjaro.   Thank you for entrusting me with your care and for choosing [facility], Dr. Ileene Patrick

## 2023-01-17 NOTE — Progress Notes (Signed)
HPI :  32 year old female here for a follow-up visit for MASH/elevated liver enzymes.  She was last seen in September 2023.  Recall she has a history of obesity, PCOS, DM II diagnosed last breast cancer stage IV initially diagnosed 09/2020 s/p bilateral mastectomy, now with metastatic bone lesions. 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   Initially referred in October 2022 for elevated liver function testing. She had elevated liver enzymes with transaminitis in the low 100s back in 2016, had a negative serologic work-up by hepatology at that time and was thought to have NASH with grade 2 fibrosis based on liver biopsy.  In May 2022 she was diagnosed with breast cancer, her baseline LFTs were also in the low 100s at that time.  She underwent bilateral mastectomy and received chemo (Cyclophosphamide and Doxorubicin) infusions 5/25 and 11/10/2020 then discontinued due to rising LFTs -her ALT peaked on July 28 with a value of 515 and AST of 251.  Once these were withdrawn her AST went to 60 and ALT to 120, and have since have maintained at prior elevated levels. No alcohol or drug use.    She subsequently was on a regimen of anastrozole, goserelin, Zometa.  She has since been transitioned to [facility] last December. She underwent a liver MRI on 11/30/2020 which did not show any evidence of metastatic disease to the liver or intra/extrahepatic ductal dilatation but showed evidence of metastatic disease to the spine.    At the time of her last visit she had been on Ozempic and had initially lost some weight on it but then regained it and had a hard time losing weight.  We performed an ultrasound with elastography in September of last year.  That showed hepatic steatosis with a median K PA of 7.0, likely ruling out any advanced fibrotic change.  Since I have seen her she unfortunately was recently hospitalized for a few days earlier this month with a  fever and symptoms of infection.  Unclear what type of infection she had, she was given antibiotics and recovered well.  She is feeling well today without complaints.  She states she has since been transition to [facility] over the past 2 months as she states her weight had been stagnant on Ozempic and it was not working anymore.  She feels that she is lost some additional weight since that time.  Her weight is currently 177 pounds, she is lost about 15 pounds since of last seen her.  She does not drink any coffee, she does not like it.  No alcohol use.  When she was sick in the hospital she had a CT scan of her chest abdomen and pelvis which did not show anything concerning, her liver actually appeared normal at that time.  Her liver enzymes in recent months have been improved, when last checked in the hospital they were normal but she typically has a mild AST and ALT elevation. AST 40-60s, ALT 40-90s past 6 months  She is followed closely by Dr. Georgiann Mohs in the Oncology dept, she is CT scan imaging every 6 months and remains on regimen as outlined:  Current treatment: Anastrozole with goserelin and Verzenio and Zometa      She has had a prior serologic work-up that was negative for other causes of chronic liver disease and was immune to hepatitis a and B.    Liver, needle/core biopsy 08/03/2014: - BENIGN LIVER WITH FEATURES OF  STEATOHEPATITIS, SEE COMMENT. - TRICHROME STAIN DEMONSTRATES PORTAL AND SINUSOIDAL FIBROSIS. - IRON STAIN NEGATIVE FOR HEMOSIDERIN DEPOSITION.     CT C/A/P 01/11/22: IMPRESSION: Further decrease in size of 8 mm left axillary lymph node.   No new or progressive metastatic disease identified.   Stable mixed lytic and sclerotic bone metastases in the thoracolumbar spine and pelvis.   Hepatobiliary: No masses identified. Gallbladder is unremarkable. No evidence of biliary ductal dilatation.     Labs 07/20/2014 from [facility]: Hepatitis C RNA undetectable. Iron  within normal limits. AMA positive 1.34. ASMA negative. Hep B surface antigen negative. Hep B core total antibody nonreactive, hepatitis B surface antibody positive. Hepatitis A total antibody reactive. HIV nonreactive. Quantitative immunoglobulins within normal limits. A1AT within normal limits. Ceruloplasmin within normal limits. ANA negative. LKM negative. Rheumatoid factor negative.    [place] elastography 02/2022: IMPRESSION: ULTRASOUND RUQ:   1. Increased parenchymal echogenicity of the liver compatible with hepatic steatosis.   ULTRASOUND HEPATIC ELASTOGRAPHY:   Median kPa:  7.0   Diagnostic category: < or = 9 kPa: in the absence of other known clinical signs, rules out cACLD    CT abdomen / pelvis with contrast 01/05/23: IMPRESSION: No acute findings in the abdomen or pelvis. Hepatobiliary: No focal hepatic abnormality. Gallbladder unremarkable. Stable osseous metastatic disease.  CT PE protocol 01/10/23: IMPRESSION: No evidence of pulmonary embolism.   Status post bilateral mastectomy with left axillary lymph node dissection. Radiation changes in the anterior left upper lobe.   Residual 7 mm short axis left axillary node, unchanged from recent CT, suspicious for nodal metastasis.   Past Medical History:  Diagnosis Date   Asthma    remote   Breast cancer (HCC)    Elevated liver enzymes    Family history of breast cancer    Family history of ovarian cancer    Family history of prostate cancer    Family history of stomach cancer    Hepatic steatosis    Hyperglycemia    A1c 7.6% 12/19/20   Migraines    PCOS (polycystic ovarian syndrome)    Pre-diabetes      Past Surgical History:  Procedure Laterality Date   MASTECTOMY W/ SENTINEL NODE BIOPSY Right 01/31/2021   Procedure: RIGHT SIMPLE MASTECTOMY;  Surgeon: Harriette Bouillon, MD;  Location: [facility] OR;  Service: General;  Laterality: Right;   MODIFIED MASTECTOMY Left 01/31/2021   Procedure: LEFT MODIFIED RADICAL  MASTECTOMY;  Surgeon: Harriette Bouillon, MD;  Location: [facility] OR;  Service: General;  Laterality: Left;   NO PAST SURGERIES     PORTACATH PLACEMENT Right 10/18/2020   Procedure: INSERTION PORT-A-CATH;  Surgeon: Harriette Bouillon, MD;  Location: [facility];  Service: General;  Laterality: Right;   Family History  Problem Relation Age of Onset   Ovarian cancer Maternal Grandmother        unknown age of diagnosis   Prostate cancer Maternal Grandfather        metastatic   Breast cancer Cousin        unknown age of diagnosis, maternal first cousin   Stomach cancer Cousin        dx 20s/30s, maternal first cousin   Social History   Tobacco Use   Smoking status: Never   Smokeless tobacco: Never  Vaping Use   Vaping status: Never Used  Substance Use Topics   Alcohol use: Never   Drug use: Never   Current Outpatient Medications  Medication Sig Dispense Refill   abemaciclib (VERZENIO)  100 MG tablet Take 1 tablet (100 mg total) by mouth 2 (two) times daily. Swallow tablets whole. Do not chew, crush, or split tablets before swallowing. 56 tablet 3   anastrozole (ARIMIDEX) 1 MG tablet Take 1 tablet (1 mg total) by mouth daily. 90 tablet 3   calcium carbonate (TUMS - DOSED IN MG ELEMENTAL CALCIUM) 500 MG chewable tablet Chew 2 tablets by mouth every other day.     MOUNJARO 5 MG/0.5ML Pen Inject 5 mg into the skin once a week.     omeprazole (PRILOSEC) 20 MG capsule Take 1 capsule (20 mg total) by mouth daily. 30 capsule 0   ondansetron (ZOFRAN-ODT) 4 MG disintegrating tablet Take 1 tablet (4 mg total) by mouth every 8 (eight) hours as needed for nausea or vomiting. 8 tablet 0   potassium chloride (KLOR-CON) 10 MEQ tablet Take 1 tablet (10 mEq total) by mouth daily. 20 tablet 0   Vitamin D, Ergocalciferol, (DRISDOL) 1.25 MG (50000 UNIT) CAPS capsule Take 50,000 Units by mouth once a week.     No current facility-administered medications for this visit.   No Known Allergies   Review  of Systems: All systems reviewed and negative except where noted in HPI.    CT Angio Chest PE W/Cm &/Or Wo Cm  Result Date: 01/10/2023 CLINICAL DATA:  Right breast cancer, status post bilateral mastectomy. Fever and flank pain. Evaluate for PE. EXAM: CT ANGIOGRAPHY CHEST WITH CONTRAST TECHNIQUE: Multidetector CT imaging of the chest was performed using the standard protocol during bolus administration of intravenous contrast. Multiplanar CT image reconstructions and MIPs were obtained to evaluate the vascular anatomy. RADIATION DOSE REDUCTION: This exam was performed according to the departmental dose-optimization program which includes automated exposure control, adjustment of the mA and/or kV according to patient size and/or use of iterative reconstruction technique. CONTRAST:  75mL OMNIPAQUE IOHEXOL 350 MG/ML SOLN COMPARISON:  CT chest dated 12/31/2022. FINDINGS: Cardiovascular: Satisfactory opacification of the bilateral pulmonary arteries to the lobar level. No evidence of pulmonary embolism. Although not tailored for evaluation of the thoracic aorta, there is no evidence of thoracic aortic aneurysm or dissection. Heart is normal in size.  No pericardial effusion. Right chest port terminates in the upper right atrium. Mediastinum/Nodes: No suspicious mediastinal lymphadenopathy. Status post left axillary lymph node dissection with residual 7 mm short axis left axillary node (series 4/image 44), suspicious. Lungs/Pleura: Radiation changes in the anterior left upper lobe. No suspicious pulmonary nodules. No focal consolidation. No pleural effusion or pneumothorax. Upper Abdomen: Visualized upper abdomen is grossly unremarkable. Musculoskeletal: Status post bilateral mastectomy. Visualized osseous structures are within normal limits. Review of the MIP images confirms the above findings. IMPRESSION: No evidence of pulmonary embolism. Status post bilateral mastectomy with left axillary lymph node dissection.  Radiation changes in the anterior left upper lobe. Residual 7 mm short axis left axillary node, unchanged from recent CT, suspicious for nodal metastasis. Electronically Signed   By: Charline Bills M.D.   On: 01/10/2023 03:35   DG Chest Port 1 View  Result Date: 01/09/2023 CLINICAL DATA:  Questionable sepsis EXAM: PORTABLE CHEST 1 VIEW COMPARISON:  Chest x-ray 620 1,002 FINDINGS: Right chest port catheter tip ends in the SVC. The heart size and mediastinal contours are within normal limits. Both lungs are clear. The visualized skeletal structures are unremarkable. IMPRESSION: No active disease. Electronically Signed   By: Darliss Cheney M.D.   On: 01/09/2023 21:59   CT ABDOMEN PELVIS W CONTRAST  Result Date: 01/05/2023  CLINICAL DATA:  Abdominal pain, nausea, vomiting EXAM: CT ABDOMEN AND PELVIS WITH CONTRAST TECHNIQUE: Multidetector CT imaging of the abdomen and pelvis was performed using the standard protocol following bolus administration of intravenous contrast. RADIATION DOSE REDUCTION: This exam was performed according to the departmental dose-optimization program which includes automated exposure control, adjustment of the mA and/or kV according to patient size and/or use of iterative reconstruction technique. CONTRAST:  OMNIPAQUE IOHEXOL 300 MG/ML  SOLN COMPARISON:  12/31/2022 FINDINGS: Lower chest: No acute abnormality Hepatobiliary: No focal hepatic abnormality. Gallbladder unremarkable. Pancreas: No focal abnormality or ductal dilatation. Spleen: No focal abnormality.  Normal size. Adrenals/Urinary Tract: No adrenal abnormality. No focal renal abnormality. No stones or hydronephrosis. Urinary bladder is unremarkable. Stomach/Bowel: Stomach, large and small bowel grossly unremarkable. Normal appendix. Vascular/Lymphatic: No evidence of aneurysm or adenopathy. Reproductive: Uterus and adnexa unremarkable.  No mass. Other: No free fluid or free air. Musculoskeletal: Scattered lucent lesions  within T11, L2 and L5 vertebral bodies as well as the right pelvis and left iliac bone. Mixed lytic and sclerotic sacral lesion. These are stable since prior study. IMPRESSION: No acute findings in the abdomen or pelvis. Stable osseous metastatic disease. Electronically Signed   By: Charlett Nose M.D.   On: 01/05/2023 21:48   CT Head Wo Contrast  Result Date: 01/05/2023 CLINICAL DATA:  Sudden severe headache. Nausea and vomiting for 1 week. EXAM: CT HEAD WITHOUT CONTRAST TECHNIQUE: Contiguous axial images were obtained from the base of the skull through the vertex without intravenous contrast. RADIATION DOSE REDUCTION: This exam was performed according to the departmental dose-optimization program which includes automated exposure control, adjustment of the mA and/or kV according to patient size and/or use of iterative reconstruction technique. COMPARISON:  CT brain 08/04/2021 FINDINGS: Brain: The ventricles are normal in size and configuration. The basilar cisterns are patent. No mass, mass effect, or midline shift. No acute intracranial hemorrhage is seen. No abnormal extra-axial fluid collection. Preservation of the normal cortical gray-white interface without CT evidence of an acute major vascular territorial cortical based infarction. Vascular: No hyperdense vessel or unexpected calcification. Skull: Normal. Negative for fracture or focal lesion. Sinuses/Orbits: The visualized orbits are unremarkable. There is a mild-to-moderate inferior right maxillary sinus mucosal polyp. This is grossly similar to prior on the available comparison study in which less of the right maxillary sinuses imaged. Otherwise, the visualized paranasal sinuses and mastoid air cells are clear. No acute calvarial fracture is seen. Other: None. IMPRESSION: 1. No acute intracranial process is seen. 2. Mild-to-moderate inferior right maxillary sinus mucosal polyp. Electronically Signed   By: Neita Garnet M.D.   On: 01/05/2023 20:28   CT  CHEST ABDOMEN PELVIS W CONTRAST  Result Date: 12/31/2022 CLINICAL DATA:  Metastatic breast cancer, follow-up. * Tracking Code: BO *. EXAM: CT CHEST, ABDOMEN, AND PELVIS WITH CONTRAST TECHNIQUE: Multidetector CT imaging of the chest, abdomen and pelvis was performed following the standard protocol during bolus administration of intravenous contrast. RADIATION DOSE REDUCTION: This exam was performed according to the departmental dose-optimization program which includes automated exposure control, adjustment of the mA and/or kV according to patient size and/or use of iterative reconstruction technique. CONTRAST:  OMNIPAQUE IOHEXOL 300 MG/ML  SOLN COMPARISON:  Multiple priors including CT July 01, 2022. FINDINGS: CT CHEST FINDINGS Cardiovascular: Accessed right chest Port-A-Cath with tip at the superior cavoatrial junction. Normal caliber abdominal aorta. No central pulmonary embolus on this nondedicated study. Normal size heart. No significant pericardial effusion/thickening. Mediastinum/Nodes: No suspicious thyroid  nodule. Stable 8 mm short axis left axillary lymph node on image 27/2. Prior left axillary lymph node dissection No pathologically enlarged mediastinal or hilar lymph nodes. Lungs/Pleura: Postradiation change in the anterior left upper lobe. No suspicious pulmonary nodules or masses. No pleural effusion. No pneumothorax Musculoskeletal: Similar appearance of the osseous metastasis including: Stable subtle lucent sternal lesion. Stable lucency in the lateral left T11 vertebral body stable sclerotic focus in the anterior T10 vertebral body. No new suspicious lytic or blastic lesion of bone identified. CT ABDOMEN PELVIS FINDINGS Hepatobiliary: No suspicious hepatic lesion gallbladder is unremarkable. No biliary ductal dilation Pancreas: No pancreatic ductal dilation or evidence of acute inflammation. Spleen: No splenomegaly. Adrenals/Urinary Tract: Bilateral adrenal glands appear normal. No  hydronephrosis. Kidneys demonstrate symmetric enhancement Stomach/Bowel: Stomach is unremarkable for degree of distension. No pathologic dilation of small or large bowel. No evidence of acute bowel inflammation. Vascular/Lymphatic: Normal caliber abdominal aorta. Smooth IVC contours. The portal, splenic and superior mesenteric veins are patent. No pathologically enlarged abdominal or pelvic lymph nodes. Reproductive: Uterus and bilateral adnexa are unremarkable. Other: No significant abdominopelvic free fluid Musculoskeletal: No significant interval change in the osseous metastasis including: Lucent lesions at L2 and L5 within the right ischium and right ilium. Additional lucent and sclerotic lesions in the right hemi sacrum and left iliac bone posteriorly no new aggressive lytic or blastic lesion of bone IMPRESSION: 1. No significant interval change in the osseous metastasis. 2. No evidence of new or progressive metastatic disease in the chest, abdomen or pelvis. 3. Stable 8 mm short axis left axillary lymph node. Electronically Signed   By: Maudry Mayhew M.D.   On: 12/31/2022 10:31    Physical Exam: BP 110/60 (BP Location: Right Arm, Patient Position: Sitting, Cuff Size: Large)   Pulse 96   Ht 5' 0.5" (1.537 m)   Wt 177 lb (80.3 kg)   BMI 34.00 kg/m  Constitutional: Pleasant,well-developed, female in no acute distress. Neurological: Alert and oriented to person place and time. Psychiatric: Normal mood and affect. Behavior is normal.   ASSESSMENT: 32 y.o. female here for assessment of the following  1. Metabolic dysfunction-associated steatohepatitis (MASH)   2. Elevated liver enzymes   3. Malignant neoplasm of female breast, unspecified estrogen receptor status, unspecified laterality, unspecified site of breast (HCC)    Biopsy-proven MASH -over time she has been on Ozempic and now Mounjaro with good weight loss.  Her elastography in September was favorable without high risk changes  concerning for fibrosis.  Her liver enzymes have improved with continued weight loss.  Her BMI remains at 34.  She understands risk for fibrosis and cirrhosis over time.  Hopefully with continued weight loss and use of Mounjaro this can really minimize risk of progression.  We did discuss new medication Rezdiffra, holding off on that for now as she is doing a good job with weight loss on Mounjaro and her last elastography looked improved without any evidence of advanced fibrosis. She did develop some thrombocytopenia in the hospital in the setting of her infection, she says Dr. Georgiann Mohs will follow that up, they were previously normal.  I recommend we repeat her liver enzymes in 6 months, she will continue to have liver imaging every 6 months as part of her malignancy evaluation.  She should see me in the office at least once yearly if not sooner with any issues.  PLAN: - recall LFTs 6 months - she will follow up with Dr. Pamelia Hoit for labs - make sure  platelets normalize post infection - imaging q 6 months with Dr. Pamelia Hoit  - continue [facility] - working well, hopefully this helps the MASH - recommended routine coffee intake, she does not like it - minimize EtOh use - office yearly, consider elastography in another year  Overall doing well, counseled on risks of progression / cirrhosis and she understands.  Harlin Rain, MD [facility]

## 2023-01-20 ENCOUNTER — Other Ambulatory Visit: Payer: Self-pay | Admitting: *Deleted

## 2023-01-20 ENCOUNTER — Ambulatory Visit: Payer: Medicaid Other | Attending: Surgery

## 2023-01-20 VITALS — Wt 176.2 lb

## 2023-01-20 DIAGNOSIS — M62830 Muscle spasm of back: Secondary | ICD-10-CM | POA: Insufficient documentation

## 2023-01-20 DIAGNOSIS — M549 Dorsalgia, unspecified: Secondary | ICD-10-CM | POA: Insufficient documentation

## 2023-01-20 DIAGNOSIS — Z483 Aftercare following surgery for neoplasm: Secondary | ICD-10-CM | POA: Insufficient documentation

## 2023-01-20 DIAGNOSIS — R293 Abnormal posture: Secondary | ICD-10-CM | POA: Insufficient documentation

## 2023-01-20 DIAGNOSIS — C50412 Malignant neoplasm of upper-outer quadrant of left female breast: Secondary | ICD-10-CM | POA: Insufficient documentation

## 2023-01-20 DIAGNOSIS — Z170 Estrogen receptor positive status [ER+]: Secondary | ICD-10-CM | POA: Insufficient documentation

## 2023-01-20 NOTE — Therapy (Signed)
OUTPATIENT PHYSICAL THERAPY SOZO SCREENING NOTE   Patient Name: Gabriela Sullivan MRN: [id number] DOB:12-20-1990, 32 y.o., female Today's Date: 01/20/2023  PCP: Leane Call, MD REFERRING PROVIDER: Harriette Bouillon, MD   PT End of Session - 01/20/23 0857     Visit Number 8   3 unchanged due to screen only   PT Start Time 0856    PT Stop Time 0901    PT Time Calculation (min) 5 min    Activity Tolerance Patient tolerated treatment well    Behavior During Therapy [facility] for tasks assessed/performed             Past Medical History:  Diagnosis Date   Asthma    remote   Breast cancer (HCC)    Elevated liver enzymes    Family history of breast cancer    Family history of ovarian cancer    Family history of prostate cancer    Family history of stomach cancer    Hepatic steatosis    Hyperglycemia    A1c 7.6% 12/19/20   Migraines    PCOS (polycystic ovarian syndrome)    Pre-diabetes    Past Surgical History:  Procedure Laterality Date   MASTECTOMY W/ SENTINEL NODE BIOPSY Right 01/31/2021   Procedure: RIGHT SIMPLE MASTECTOMY;  Surgeon: Harriette Bouillon, MD;  Location: [facility] OR;  Service: General;  Laterality: Right;   MODIFIED MASTECTOMY Left 01/31/2021   Procedure: LEFT MODIFIED RADICAL MASTECTOMY;  Surgeon: Harriette Bouillon, MD;  Location: [facility] OR;  Service: General;  Laterality: Left;   NO PAST SURGERIES     PORTACATH PLACEMENT Right 10/18/2020   Procedure: INSERTION PORT-A-CATH;  Surgeon: Harriette Bouillon, MD;  Location:  SURGERY CENTER;  Service: General;  Laterality: Right;   Patient Active Problem List   Diagnosis Date Noted   Sepsis (HCC) 01/10/2023   Pancytopenia (HCC) 01/10/2023   Febrile neutropenia (HCC) 01/10/2023   Hypokalemia 01/10/2023   Hyponatremia 01/10/2023   Hypocalcemia 01/10/2023   Hyperbilirubinemia 01/10/2023   Elevated lipase 01/10/2023   Nausea, vomiting and diarrhea 01/10/2023   Transaminitis 01/10/2023   Nonalcoholic steatohepatitis  06/21/2022   Type 2 diabetes mellitus (HCC) 06/20/2022   History of therapeutic radiation 11/07/2021   Left breast cancer with T3 tumor, >5 cm in greatest dimension (HCC) 01/31/2021   Bone metastases 12/28/2020   Port-A-Cath in place 12/06/2020   Genetic testing 11/25/2020   Goals of care, counseling/discussion 10/25/2020   Lytic bone lesions on xray 10/16/2020   Family history of ovarian cancer    Family history of prostate cancer    Family history of breast cancer    Family history of stomach cancer    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 It is ER/PR positive and HER2 negative with a Ki67 of 15%. She underwent neoadjuvant chemotherapy beginning 10/24/2020 for 2 rounds which was stopped due to elevated LFTs. She had a bilateral mastectomy with left axillary node dissection on 01/31/2021 with 15/15 nodes positive. She started Anastrozole on 12/06/2020. 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   PRECAUTIONS: left UE Lymphedema risk, None  SUBJECTIVE: Pt returns for her 3 month L-Dex screen. "I was sick and recently hospitalized recently and my back has been bothering me since then from laying down so much. Dr. Pamelia Hoit said I should return for some physical therapy. "  PAIN:  Are you having pain? No  SOZO SCREENING: Patient was assessed today using the SOZO machine to determine the lymphedema index score. This was compared to her baseline score. It was determined that she is within the recommended range when compared to her baseline and no further  action is needed at this time. She will continue SOZO screenings. These are done every 3 months for 2 years post operatively followed by every 6 months for 2 years, and then annually. Patient reported a change in status to PTA which initiated the PTA consulting with a PT. PT determined it would be appropriate to initiate therapy at this time. PT requested a referral from patient's provider.   Plan: Cont every 3 month L-Dex screens for up to 2 years from her Lt ALND (~02/01/2023)   L-DEX FLOWSHEETS - 01/20/23 0800       L-DEX LYMPHEDEMA SCREENING   Measurement Type Unilateral    L-DEX MEASUREMENT EXTREMITY Upper Extremity    POSITION  Standing    DOMINANT SIDE Right    At Risk Side Left    BASELINE SCORE (UNILATERAL) 2.9    L-DEX SCORE (UNILATERAL) 2.5    VALUE CHANGE (UNILAT) -0.4              Hermenia Bers, PTA 01/20/2023, 9:04 AM
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
## 2023-01-21 ENCOUNTER — Ambulatory Visit: Payer: Medicaid Other | Admitting: Physical Therapy

## 2023-01-21 ENCOUNTER — Encounter: Payer: Self-pay | Admitting: Physical Therapy

## 2023-01-21 ENCOUNTER — Other Ambulatory Visit: Payer: Self-pay

## 2023-01-21 DIAGNOSIS — Z483 Aftercare following surgery for neoplasm: Secondary | ICD-10-CM | POA: Diagnosis present

## 2023-01-21 DIAGNOSIS — C50412 Malignant neoplasm of upper-outer quadrant of left female breast: Secondary | ICD-10-CM | POA: Diagnosis present

## 2023-01-21 DIAGNOSIS — Z170 Estrogen receptor positive status [ER+]: Secondary | ICD-10-CM | POA: Diagnosis present

## 2023-01-21 DIAGNOSIS — R293 Abnormal posture: Secondary | ICD-10-CM

## 2023-01-21 DIAGNOSIS — M549 Dorsalgia, unspecified: Secondary | ICD-10-CM | POA: Diagnosis present

## 2023-01-21 DIAGNOSIS — M62830 Muscle spasm of back: Secondary | ICD-10-CM | POA: Diagnosis present

## 2023-01-24 ENCOUNTER — Other Ambulatory Visit: Payer: Self-pay

## 2023-01-24 ENCOUNTER — Encounter (HOSPITAL_COMMUNITY): Payer: Self-pay | Admitting: Emergency Medicine

## 2023-01-24 ENCOUNTER — Inpatient Hospital Stay (HOSPITAL_COMMUNITY)
Admission: EM | Admit: 2023-01-24 | Discharge: 2023-01-28 | DRG: 809 | Disposition: A | Discharge status: Home / Self Care | Payer: Medicaid Other | Attending: Internal Medicine | Admitting: Internal Medicine

## 2023-01-24 ENCOUNTER — Ambulatory Visit: Payer: Medicaid Other

## 2023-01-24 ENCOUNTER — Emergency Department (HOSPITAL_COMMUNITY): Payer: Medicaid Other

## 2023-01-24 DIAGNOSIS — E669 Obesity, unspecified: Secondary | ICD-10-CM | POA: Diagnosis present

## 2023-01-24 DIAGNOSIS — Z79811 Long term (current) use of aromatase inhibitors: Secondary | ICD-10-CM

## 2023-01-24 DIAGNOSIS — E876 Hypokalemia: Secondary | ICD-10-CM | POA: Diagnosis present

## 2023-01-24 DIAGNOSIS — T451X5A Adverse effect of antineoplastic and immunosuppressive drugs, initial encounter: Secondary | ICD-10-CM | POA: Diagnosis present

## 2023-01-24 DIAGNOSIS — J45909 Unspecified asthma, uncomplicated: Secondary | ICD-10-CM | POA: Diagnosis present

## 2023-01-24 DIAGNOSIS — K7581 Nonalcoholic steatohepatitis (NASH): Secondary | ICD-10-CM | POA: Diagnosis present

## 2023-01-24 DIAGNOSIS — Z803 Family history of malignant neoplasm of breast: Secondary | ICD-10-CM

## 2023-01-24 DIAGNOSIS — K521 Toxic gastroenteritis and colitis: Secondary | ICD-10-CM | POA: Diagnosis present

## 2023-01-24 DIAGNOSIS — Z8042 Family history of malignant neoplasm of prostate: Secondary | ICD-10-CM

## 2023-01-24 DIAGNOSIS — Z8639 Personal history of other endocrine, nutritional and metabolic disease: Secondary | ICD-10-CM

## 2023-01-24 DIAGNOSIS — D61810 Antineoplastic chemotherapy induced pancytopenia: Principal | ICD-10-CM | POA: Diagnosis present

## 2023-01-24 DIAGNOSIS — Z7985 Long-term (current) use of injectable non-insulin antidiabetic drugs: Secondary | ICD-10-CM

## 2023-01-24 DIAGNOSIS — Z6834 Body mass index (BMI) 34.0-34.9, adult: Secondary | ICD-10-CM

## 2023-01-24 DIAGNOSIS — D709 Neutropenia, unspecified: Principal | ICD-10-CM | POA: Diagnosis present

## 2023-01-24 DIAGNOSIS — Z923 Personal history of irradiation: Secondary | ICD-10-CM

## 2023-01-24 DIAGNOSIS — Z170 Estrogen receptor positive status [ER+]: Secondary | ICD-10-CM

## 2023-01-24 DIAGNOSIS — E1169 Type 2 diabetes mellitus with other specified complication: Secondary | ICD-10-CM

## 2023-01-24 DIAGNOSIS — R5081 Fever presenting with conditions classified elsewhere: Secondary | ICD-10-CM | POA: Diagnosis present

## 2023-01-24 DIAGNOSIS — E119 Type 2 diabetes mellitus without complications: Secondary | ICD-10-CM

## 2023-01-24 DIAGNOSIS — D61818 Other pancytopenia: Secondary | ICD-10-CM | POA: Diagnosis present

## 2023-01-24 DIAGNOSIS — D84821 Immunodeficiency due to drugs: Secondary | ICD-10-CM | POA: Diagnosis present

## 2023-01-24 DIAGNOSIS — M899 Disorder of bone, unspecified: Secondary | ICD-10-CM | POA: Diagnosis present

## 2023-01-24 DIAGNOSIS — Z800 Family history of malignant neoplasm of digestive organs: Secondary | ICD-10-CM

## 2023-01-24 DIAGNOSIS — C50412 Malignant neoplasm of upper-outer quadrant of left female breast: Secondary | ICD-10-CM

## 2023-01-24 DIAGNOSIS — Z1152 Encounter for screening for COVID-19: Secondary | ICD-10-CM

## 2023-01-24 DIAGNOSIS — C799 Secondary malignant neoplasm of unspecified site: Secondary | ICD-10-CM | POA: Diagnosis present

## 2023-01-24 DIAGNOSIS — M898X9 Other specified disorders of bone, unspecified site: Secondary | ICD-10-CM | POA: Diagnosis present

## 2023-01-24 DIAGNOSIS — Z9013 Acquired absence of bilateral breasts and nipples: Secondary | ICD-10-CM

## 2023-01-24 DIAGNOSIS — Z8041 Family history of malignant neoplasm of ovary: Secondary | ICD-10-CM

## 2023-01-24 NOTE — ED Triage Notes (Signed)
Pt reports fever at home of 104. Pt reports having chemo for bone cancer. Pt reports last treatment was 2 weeks ago.

## 2023-01-25 ENCOUNTER — Emergency Department (HOSPITAL_COMMUNITY): Payer: Medicaid Other

## 2023-01-25 DIAGNOSIS — Z9013 Acquired absence of bilateral breasts and nipples: Secondary | ICD-10-CM | POA: Diagnosis not present

## 2023-01-25 DIAGNOSIS — E876 Hypokalemia: Secondary | ICD-10-CM | POA: Diagnosis present

## 2023-01-25 DIAGNOSIS — R5081 Fever presenting with conditions classified elsewhere: Secondary | ICD-10-CM | POA: Diagnosis present

## 2023-01-25 DIAGNOSIS — Z800 Family history of malignant neoplasm of digestive organs: Secondary | ICD-10-CM | POA: Diagnosis not present

## 2023-01-25 DIAGNOSIS — Z6834 Body mass index (BMI) 34.0-34.9, adult: Secondary | ICD-10-CM | POA: Diagnosis not present

## 2023-01-25 DIAGNOSIS — Z8042 Family history of malignant neoplasm of prostate: Secondary | ICD-10-CM | POA: Diagnosis not present

## 2023-01-25 DIAGNOSIS — Z923 Personal history of irradiation: Secondary | ICD-10-CM | POA: Diagnosis not present

## 2023-01-25 DIAGNOSIS — D61810 Antineoplastic chemotherapy induced pancytopenia: Secondary | ICD-10-CM | POA: Diagnosis present

## 2023-01-25 DIAGNOSIS — T451X5A Adverse effect of antineoplastic and immunosuppressive drugs, initial encounter: Secondary | ICD-10-CM | POA: Diagnosis present

## 2023-01-25 DIAGNOSIS — Z8041 Family history of malignant neoplasm of ovary: Secondary | ICD-10-CM | POA: Diagnosis not present

## 2023-01-25 DIAGNOSIS — D84821 Immunodeficiency due to drugs: Secondary | ICD-10-CM | POA: Diagnosis present

## 2023-01-25 DIAGNOSIS — Z170 Estrogen receptor positive status [ER+]: Secondary | ICD-10-CM | POA: Diagnosis not present

## 2023-01-25 DIAGNOSIS — Z79811 Long term (current) use of aromatase inhibitors: Secondary | ICD-10-CM | POA: Diagnosis not present

## 2023-01-25 DIAGNOSIS — E669 Obesity, unspecified: Secondary | ICD-10-CM | POA: Diagnosis present

## 2023-01-25 DIAGNOSIS — C50412 Malignant neoplasm of upper-outer quadrant of left female breast: Secondary | ICD-10-CM | POA: Diagnosis present

## 2023-01-25 DIAGNOSIS — D709 Neutropenia, unspecified: Secondary | ICD-10-CM | POA: Diagnosis present

## 2023-01-25 DIAGNOSIS — Z1152 Encounter for screening for COVID-19: Secondary | ICD-10-CM | POA: Diagnosis not present

## 2023-01-25 DIAGNOSIS — C799 Secondary malignant neoplasm of unspecified site: Secondary | ICD-10-CM | POA: Diagnosis present

## 2023-01-25 DIAGNOSIS — M899 Disorder of bone, unspecified: Secondary | ICD-10-CM | POA: Diagnosis present

## 2023-01-25 DIAGNOSIS — J45909 Unspecified asthma, uncomplicated: Secondary | ICD-10-CM | POA: Diagnosis present

## 2023-01-25 DIAGNOSIS — K521 Toxic gastroenteritis and colitis: Secondary | ICD-10-CM | POA: Diagnosis present

## 2023-01-25 DIAGNOSIS — K7581 Nonalcoholic steatohepatitis (NASH): Secondary | ICD-10-CM | POA: Diagnosis present

## 2023-01-25 DIAGNOSIS — Z7985 Long-term (current) use of injectable non-insulin antidiabetic drugs: Secondary | ICD-10-CM | POA: Diagnosis not present

## 2023-01-25 DIAGNOSIS — Z803 Family history of malignant neoplasm of breast: Secondary | ICD-10-CM | POA: Diagnosis not present

## 2023-01-25 LAB — URINALYSIS, W/ REFLEX TO CULTURE (INFECTION SUSPECTED)
Bilirubin Urine: NEGATIVE
Glucose, UA: NEGATIVE mg/dL
Hgb urine dipstick: NEGATIVE
Ketones, ur: NEGATIVE mg/dL
Nitrite: NEGATIVE
Protein, ur: NEGATIVE mg/dL
Specific Gravity, Urine: 1.016 (ref 1.005–1.030)
pH: 6 (ref 5.0–8.0)

## 2023-01-25 LAB — COMPREHENSIVE METABOLIC PANEL
ALT: 29 U/L (ref 0–44)
AST: 31 U/L (ref 15–41)
Albumin: 3.4 g/dL — ABNORMAL LOW (ref 3.5–5.0)
Alkaline Phosphatase: 51 U/L (ref 38–126)
Anion gap: 4 — ABNORMAL LOW (ref 5–15)
BUN: 14 mg/dL (ref 6–20)
CO2: 24 mmol/L (ref 22–32)
Calcium: 8 mg/dL — ABNORMAL LOW (ref 8.9–10.3)
Chloride: 109 mmol/L (ref 98–111)
Creatinine, Ser: 0.76 mg/dL (ref 0.44–1.00)
GFR, Estimated: >60 mL/min (ref >60)
Glucose, Bld: 92 mg/dL (ref 70–99)
Potassium: 3 mmol/L — ABNORMAL LOW (ref 3.5–5.1)
Sodium: 137 mmol/L (ref 135–145)
Total Bilirubin: 0.6 mg/dL (ref 0.3–1.2)
Total Protein: 6.9 g/dL (ref 6.5–8.1)

## 2023-01-25 LAB — CBC WITH DIFFERENTIAL/PLATELET
Abs Immature Granulocytes: 0.01 10*3/uL (ref 0.00–0.07)
Basophils Absolute: 0.1 10*3/uL (ref 0.0–0.1)
Basophils Relative: 2 %
Eosinophils Absolute: 0 10*3/uL (ref 0.0–0.5)
Eosinophils Relative: 0 %
HCT: 25.4 % — ABNORMAL LOW (ref 36.0–46.0)
Hemoglobin: 8.9 g/dL — ABNORMAL LOW (ref 12.0–15.0)
Immature Granulocytes: 0 %
Lymphocytes Relative: 31 %
Lymphs Abs: 0.8 10*3/uL (ref 0.7–4.0)
MCH: 35.9 pg — ABNORMAL HIGH (ref 26.0–34.0)
MCHC: 35 g/dL (ref 30.0–36.0)
MCV: 102.4 fL — ABNORMAL HIGH (ref 80.0–100.0)
Monocytes Absolute: 0.1 10*3/uL (ref 0.1–1.0)
Monocytes Relative: 6 %
Neutro Abs: 1.5 10*3/uL — ABNORMAL LOW (ref 1.7–7.7)
Neutrophils Relative %: 61 %
Platelets: 98 10*3/uL — ABNORMAL LOW (ref 150–400)
RBC: 2.48 MIL/uL — ABNORMAL LOW (ref 3.87–5.11)
RDW: 16.3 % — ABNORMAL HIGH (ref 11.5–15.5)
WBC: 2.5 10*3/uL — ABNORMAL LOW (ref 4.0–10.5)
nRBC: 0 % (ref 0.0–0.2)

## 2023-01-25 LAB — MAGNESIUM: Magnesium: 2 mg/dL (ref 1.7–2.4)

## 2023-01-25 LAB — I-STAT CG4 LACTIC ACID, ED
Lactic Acid, Venous: 0.3 mmol/L — ABNORMAL LOW (ref 0.5–1.9)
Lactic Acid, Venous: <0.3 mmol/L — ABNORMAL LOW (ref 0.5–1.9)

## 2023-01-25 LAB — PHOSPHORUS: Phosphorus: 3.8 mg/dL (ref 2.5–4.6)

## 2023-01-25 LAB — PROTIME-INR
INR: 1.2 (ref 0.8–1.2)
Prothrombin Time: 15.3 s — ABNORMAL HIGH (ref 11.4–15.2)

## 2023-01-25 LAB — GLUCOSE, CAPILLARY
Glucose-Capillary: 83 mg/dL (ref 70–99)
Glucose-Capillary: 86 mg/dL (ref 70–99)

## 2023-01-25 LAB — HCG, SERUM, QUALITATIVE: Preg, Serum: NEGATIVE

## 2023-01-25 LAB — SARS CORONAVIRUS 2 BY RT PCR: SARS Coronavirus 2 by RT PCR: NEGATIVE

## 2023-01-25 MED ORDER — SODIUM CHLORIDE 0.9 % IV SOLN
2.0000 g | Freq: Three times a day (TID) | INTRAVENOUS | Status: DC
  Administered 2023-01-25 – 2023-01-28 (×10): 2 g via INTRAVENOUS
  Filled 2023-01-25 (×10): qty 12.5

## 2023-01-25 MED ORDER — ACETAMINOPHEN 325 MG PO TABS
650.0000 mg | ORAL_TABLET | Freq: Four times a day (QID) | ORAL | Status: DC | PRN
  Administered 2023-01-26 – 2023-01-27 (×2): 650 mg via ORAL
  Filled 2023-01-25 (×2): qty 2

## 2023-01-25 MED ORDER — CHLORHEXIDINE GLUCONATE CLOTH 2 % EX PADS
6.0000 | MEDICATED_PAD | Freq: Every day | CUTANEOUS | Status: DC
  Administered 2023-01-25 – 2023-01-28 (×4): 6 via TOPICAL

## 2023-01-25 MED ORDER — POTASSIUM CHLORIDE CRYS ER 20 MEQ PO TBCR
40.0000 meq | EXTENDED_RELEASE_TABLET | Freq: Once | ORAL | Status: AC
  Administered 2023-01-25: 40 meq via ORAL
  Filled 2023-01-25: qty 2

## 2023-01-25 MED ORDER — SODIUM CHLORIDE 0.9 % IV SOLN
2.0000 g | Freq: Once | INTRAVENOUS | Status: AC
  Administered 2023-01-25: 2 g via INTRAVENOUS
  Filled 2023-01-25: qty 12.5

## 2023-01-25 MED ORDER — ANASTROZOLE 1 MG PO TABS
1.0000 mg | ORAL_TABLET | Freq: Every day | ORAL | Status: DC
  Administered 2023-01-25 – 2023-01-28 (×4): 1 mg via ORAL
  Filled 2023-01-25 (×4): qty 1

## 2023-01-25 MED ORDER — INSULIN ASPART 100 UNIT/ML IJ SOLN: 0.0000 [IU] | Freq: Three times a day (TID) | INTRAMUSCULAR | Status: DC

## 2023-01-25 MED ORDER — POTASSIUM CHLORIDE IN NACL 20-0.9 MEQ/L-% IV SOLN
INTRAVENOUS | Status: DC
  Filled 2023-01-25 (×6): qty 1000

## 2023-01-25 MED ORDER — MAGNESIUM SULFATE 2 GM/50ML IV SOLN
2.0000 g | Freq: Once | INTRAVENOUS | Status: AC
  Administered 2023-01-25: 2 g via INTRAVENOUS
  Filled 2023-01-25: qty 50

## 2023-01-25 MED ORDER — CALCIUM CARBONATE ANTACID 500 MG PO CHEW
2.0000 | CHEWABLE_TABLET | ORAL | Status: DC
  Administered 2023-01-25 – 2023-01-27 (×2): 400 mg via ORAL
  Filled 2023-01-25 (×2): qty 2

## 2023-01-25 MED ORDER — ONDANSETRON HCL 4 MG/2ML IJ SOLN: 4.0000 mg | Freq: Four times a day (QID) | INTRAMUSCULAR | Status: DC | PRN

## 2023-01-25 NOTE — H&P (Signed)
History and Physical    Patient: Gabriela Sullivan NGE:[id number] DOB: 21-Jun-1990 DOA: 01/24/2023 DOS: the patient was seen and examined on 01/25/2023 PCP: Leane Call, MD  Patient coming from: Home  Chief Complaint:  Chief Complaint  Patient presents with   Fever   HPI: Gabriela Sullivan is a 32 y.o. female with medical history significant of remote asthma, hepatic asteatosis, migraine headaches, prediabetes, breast cancer, status post bilateral mastectomy, radiation therapy, 2 cycles of Vibramycin and Cytoxan given but discontinued due to abnormal LFTs currently on abemaciclib and anastrozole who presented to the emergency department complaints of fever of 104 F at home.  She is on daily anastrozole and chemotherapy with abemaciclib receiving her last treatment 2 weeks ago.  She denied rhinorrhea, sore throat, wheezing or hemoptysis.  No chest pain, palpitations, diaphoresis, PND, orthopnea or pitting edema of the lower extremities.  No abdominal pain, nausea, emesis, diarrhea, constipation, melena or hematochezia.  No flank pain, dysuria, frequency or hematuria.  No polyuria, polydipsia, polyphagia or blurred vision.   Lab work: Her urinalysis with small leukocyte esterase and rare bacteria microscopic examination.  Coronavirus PCR was negative.  CBC showed a white count of 2.5 with 61% neutrophils, hemoglobin 8.9 g/dL with an MCV of 401.0 fL and platelets 98.  PT 15.3, INR 1.2.  Serum pregnancy test negative.  Lactic acid 0.3 mmol/L.  CMP showed a potassium of 3.0 mmol/L, calcium 8.0 mg/dL and albumin 3.4 g/dL.  The rest of the CMP measurements were normal.  Imaging: Portable 1 view chest radiograph with no active disease.   ED course: Initial vital signs were temperature 99 F, pulse 92, respirations 16, BP 110/66 mmHg O2 sat 100% on room air.  The patient received cefepime 2 g IVPB.  Review of Systems: As mentioned in the history of present illness. All other systems reviewed and are  negative. Past Medical History:  Diagnosis Date   Asthma    remote   Breast cancer (HCC)    Elevated liver enzymes    Family history of breast cancer    Family history of ovarian cancer    Family history of prostate cancer    Family history of stomach cancer    Hepatic steatosis    Hyperglycemia    A1c 7.6% 12/19/20   Migraines    PCOS (polycystic ovarian syndrome)    Pre-diabetes    Past Surgical History:  Procedure Laterality Date   MASTECTOMY W/ SENTINEL NODE BIOPSY Right 01/31/2021   Procedure: RIGHT SIMPLE MASTECTOMY;  Surgeon: Harriette Bouillon, MD;  Location: [facility];  Service: General;  Laterality: Right;   MODIFIED MASTECTOMY Left 01/31/2021   Procedure: LEFT MODIFIED RADICAL MASTECTOMY;  Surgeon: Harriette Bouillon, MD;  Location: [facility];  Service: General;  Laterality: Left;   NO PAST SURGERIES     PORTACATH PLACEMENT Right 10/18/2020   Procedure: INSERTION PORT-A-CATH;  Surgeon: Harriette Bouillon, MD;  Location: [facility];  Service: General;  Laterality: Right;   Social History:  reports that she has never smoked. She has never used smokeless tobacco. She reports that she does not drink alcohol and does not use drugs.  No Known Allergies  Family History  Problem Relation Age of Onset   Ovarian cancer Maternal Grandmother        unknown age of diagnosis   Prostate cancer Maternal Grandfather        metastatic   Breast cancer Cousin        unknown age of diagnosis, maternal  first cousin   Stomach cancer Cousin        dx 20s/30s, maternal first cousin    Prior to Admission medications   Medication Sig Start Date End Date Taking? Authorizing Provider  abemaciclib (VERZENIO) 100 MG tablet Take 1 tablet (100 mg total) by mouth 2 (two) times daily. Swallow tablets whole. Do not chew, crush, or split tablets before swallowing. 10/08/22  Yes [name], MD  anastrozole (ARIMIDEX) 1 MG tablet Take 1 tablet (1 mg total) by mouth daily. 04/09/22  Yes [name], MD   calcium carbonate (TUMS - DOSED IN MG ELEMENTAL CALCIUM) 500 MG chewable tablet Chew 2 tablets by mouth every other day.   Yes [provider]  ondansetron (ZOFRAN-ODT) 4 MG disintegrating tablet Take 1 tablet (4 mg total) by mouth every 8 (eight) hours as needed for nausea or vomiting. 10/26/21  Yes Benjiman Core, MD  potassium chloride (KLOR-CON) 10 MEQ tablet Take 1 tablet (10 mEq total) by mouth daily. 01/05/23  Yes Prosperi, Christian H, PA-C  MOUNJARO 5 MG/0.5ML Pen Inject 5 mg into the skin once a week. 11/21/22   [provider]  Vitamin D, Ergocalciferol, (DRISDOL) 1.25 MG (50000 UNIT) CAPS capsule Take 50,000 Units by mouth once a week. 06/20/22   [provider]  prochlorperazine (COMPAZINE) 10 MG tablet Take 1 tablet (10 mg total) by mouth every 6 (six) hours as needed (Nausea or vomiting). 10/04/20 12/21/20  Magrinat, Valentino Hue, MD    Physical Exam: Vitals:   01/25/23 0630 01/25/23 0645 01/25/23 0651 01/25/23 0700  BP:    (!) 101/58  Pulse: 77 80 85 75  Resp: 15 18 16 16   Temp:      TempSrc:      SpO2: 99% 96% 96% 98%  Weight:      Height:       Physical Exam Vitals and nursing note reviewed.  Constitutional:      General: She is awake. She is not in acute distress.    Appearance: Normal appearance. She is obese. She is ill-appearing.  HENT:     Head: Normocephalic.     Nose: No rhinorrhea.     Mouth/Throat:     Mouth: Mucous membranes are moist.  Eyes:     General: No scleral icterus.    Pupils: Pupils are equal, round, and reactive to light.  Neck:     Vascular: No JVD.  Cardiovascular:     Rate and Rhythm: Normal rate and regular rhythm.     Heart sounds: S1 normal and S2 normal.  Pulmonary:     Effort: Pulmonary effort is normal.     Breath sounds: No wheezing, rhonchi or rales.  Abdominal:     General: Bowel sounds are normal. There is no distension.     Palpations: Abdomen is soft.     Tenderness: There is no abdominal tenderness.   Musculoskeletal:     Cervical back: Neck supple.     Right lower leg: No edema.     Left lower leg: No edema.  Skin:    General: Skin is warm and dry.  Neurological:     General: No focal deficit present.     Mental Status: She is alert and oriented to person, place, and time.  Psychiatric:        Mood and Affect: Mood normal.        Behavior: Behavior normal. Behavior is cooperative.     Data Reviewed:  Results are pending, will  review when available.  Assessment and Plan: No notes have been filed under this hospital service. Service: Hospitalist  Sepsis [facility]) In the setting of:   Febrile neutropenia (HCC) No obvious source of infection. Admit to MedSurg/inpatient. Continue IV fluids. Continue cefepime 2 g every 8 hours.   Follow blood culture and sensitivity. Follow CBC and CMP in a.m.   Active Problems:   Lytic bone lesions on xray In the setting of:   Malignant neoplasm of upper-outer quadrant of  left breast in female, estrogen receptor positive (HCC) Bone lesion seems to be stable. Continue abemaciclib 100 mg p.o. daily. Continue anastrozole 1 mg p.o. daily. Follow-up with hematology/oncology as scheduled.     Pancytopenia (HCC) In the setting of malignancy. Follow-up CBC daily.     Nonalcoholic steatohepatitis Seems to have resolved on imaging. LFTs are back to normal when compared to 2 weeks ago.     Type 2 diabetes mellitus (HCC) Carbohydrate modified diet. CBG monitoring with RI SS. Check hemoglobin A1c.     Hypokalemia Replacing. Follow potassium level.      Hypocalcemia Continue calcium supplementation. Check vitamin D level with morning labs.    Advance Care Planning:   Code Status: Full Code   Consults:   Family Communication: Her parents were at bedside.  Severity of Illness: The appropriate patient status for this patient is INPATIENT. Inpatient status is judged to be reasonable and necessary in order to provide the required  intensity of service to ensure the patient's safety. The patient's presenting symptoms, physical exam findings, and initial radiographic and laboratory data in the context of their chronic comorbidities is felt to place them at high risk for further clinical deterioration. Furthermore, it is not anticipated that the patient will be medically stable for discharge from the hospital within 2 midnights of admission.   * I certify that at the point of admission it is my clinical judgment that the patient will require inpatient hospital care spanning beyond 2 midnights from the point of admission due to high intensity of service, high risk for further deterioration and high frequency of surveillance required.*  Author: Bobette Mo, MD 01/25/2023 7:08 AM  For on call review [email or web address].   This document was prepared using Dragon voice recognition software and may contain some unintended transcription errors.

## 2023-01-25 NOTE — ED Notes (Signed)
Pt getting chest xray

## 2023-01-25 NOTE — Plan of Care (Signed)

## 2023-01-25 NOTE — ED Notes (Signed)
ED TO INPATIENT HANDOFF REPORT  ED Nurse Name and Phone #:  Mellody Dance  [phone]  S Name/Age/Gender Gabriela Sullivan 32 y.o. female Room/Bed: WA25/WA25  Code Status   Code Status: Full Code  Home/SNF/Other Home Patient oriented to: self, place, time, and situation Is this baseline? Yes   Triage Complete: Triage complete  Chief Complaint Febrile neutropenia (HCC) [D70.9, R50.81]  Triage Note Pt reports fever at home of 104. Pt reports having chemo for bone cancer. Pt reports last treatment was 2 weeks ago.    Allergies No Known Allergies  Level of Care/Admitting Diagnosis ED Disposition     ED Disposition  Admit   Condition  --   Comment  Hospital Area: [facility] [[id number]]  Level of Care: Med-Surg [16]  May admit patient to [facility] or [facility] if equivalent level of care is available:: No  Covid Evaluation: Asymptomatic - no recent exposure (last 10 days) testing not required  Diagnosis: Febrile neutropenia [facility]) [[id number]]  Admitting Physician: Bobette Mo [[id number]]  Attending Physician: Bobette Mo [[id number]]  Certification:: I certify this patient will need inpatient services for at least 2 midnights  Expected Medical Readiness: 01/27/2023          B Medical/Surgery History Past Medical History:  Diagnosis Date   Asthma    remote   Breast cancer (HCC)    Elevated liver enzymes    Family history of breast cancer    Family history of ovarian cancer    Family history of prostate cancer    Family history of stomach cancer    Hepatic steatosis    Hyperglycemia    A1c 7.6% 12/19/20   Migraines    PCOS (polycystic ovarian syndrome)    Pre-diabetes    Past Surgical History:  Procedure Laterality Date   MASTECTOMY W/ SENTINEL NODE BIOPSY Right 01/31/2021   Procedure: RIGHT SIMPLE MASTECTOMY;  Surgeon: Harriette Bouillon, MD;  Location: [facility] OR;  Service: General;  Laterality: Right;   MODIFIED MASTECTOMY Left 01/31/2021    Procedure: LEFT MODIFIED RADICAL MASTECTOMY;  Surgeon: Harriette Bouillon, MD;  Location: [facility] OR;  Service: General;  Laterality: Left;   NO PAST SURGERIES     PORTACATH PLACEMENT Right 10/18/2020   Procedure: INSERTION PORT-A-CATH;  Surgeon: Harriette Bouillon, MD;  Location: [facility];  Service: General;  Laterality: Right;     A IV Location/Drains/Wounds Patient Lines/Drains/Airways Status     Active Line/Drains/Airways     Name Placement date Placement time Site Days   Implanted Port 10/18/20 Right Chest 10/18/20  0925  Chest  829   Peripheral IV 01/25/23 22 G 1" Right Antecubital 01/25/23  0012  Antecubital  less than 1   Closed System Drain 1 Left;Lateral Breast Bulb (JP) 19 Fr. 01/31/21  1654  Breast  724   Closed System Drain 2 Lateral;Left Breast Bulb (JP) 19 Fr. 01/31/21  1657  Breast  724   Closed System Drain 3 Lateral;Right Breast Bulb (JP) 19 Fr. 01/31/21  1658  Breast  724   Closed System Drain 4 Lateral;Right Breast Bulb (JP) 19 Fr. 01/31/21  1659  Breast  724            Intake/Output Last 24 hours No intake or output data in the 24 hours ending 01/25/23 0737  Labs/Imaging Results for orders placed or performed during the hospital encounter of 01/24/23 (from the past 48 hour(s))  hCG, serum, qualitative     Status: None  Collection Time: 01/25/23 12:14 AM  Result Value Ref Range   Preg, Serum NEGATIVE NEGATIVE    Comment:        THE SENSITIVITY OF THIS METHODOLOGY IS >10 mIU/mL. Performed at [facility], [address], [place], [place] [address]   Comprehensive metabolic panel     Status: Abnormal   Collection Time: 01/25/23 12:19 AM  Result Value Ref Range   Sodium 137 135 - 145 mmol/L   Potassium 3.0 (L) 3.5 - 5.1 mmol/L   Chloride 109 98 - 111 mmol/L   CO2 24 22 - 32 mmol/L   Glucose, Bld 92 70 - 99 mg/dL    Comment: Glucose reference range applies only to samples taken after fasting for at least 8 hours.   BUN 14 6 -  20 mg/dL   Creatinine, Ser 2.53 0.44 - 1.00 mg/dL   Calcium 8.0 (L) 8.9 - 10.3 mg/dL   Total Protein 6.9 6.5 - 8.1 g/dL   Albumin 3.4 (L) 3.5 - 5.0 g/dL   AST 31 15 - 41 U/L   ALT 29 0 - 44 U/L   Alkaline Phosphatase 51 38 - 126 U/L   Total Bilirubin 0.6 0.3 - 1.2 mg/dL   GFR, Estimated >66 >44 mL/min    Comment: (NOTE) Calculated using the CKD-EPI Creatinine Equation (2021)    Anion gap 4 (L) 5 - 15    Comment: Performed at [facility], [address], [place], [place] [address]  I-Stat Lactic Acid, ED     Status: Abnormal   Collection Time: 01/25/23 12:19 AM  Result Value Ref Range   Lactic Acid, Venous 0.3 (L) 0.5 - 1.9 mmol/L  CBC with Differential     Status: Abnormal   Collection Time: 01/25/23 12:19 AM  Result Value Ref Range   WBC 2.5 (L) 4.0 - 10.5 K/uL   RBC 2.48 (L) 3.87 - 5.11 MIL/uL   Hemoglobin 8.9 (L) 12.0 - 15.0 g/dL   HCT 25.9 (L) 56.3 - 87.5 %   MCV 102.4 (H) 80.0 - 100.0 fL   MCH 35.9 (H) 26.0 - 34.0 pg   MCHC 35.0 30.0 - 36.0 g/dL   RDW 64.3 (H) 32.9 - 51.8 %   Platelets 98 (L) 150 - 400 K/uL    Comment: SPECIMEN CHECKED FOR CLOTS Immature Platelet Fraction may be clinically indicated, consider ordering this additional test [id number] REPEATED TO VERIFY PLATELET COUNT CONFIRMED BY SMEAR    nRBC 0.0 0.0 - 0.2 %   Neutrophils Relative % 61 %   Neutro Abs 1.5 (L) 1.7 - 7.7 K/uL   Lymphocytes Relative 31 %   Lymphs Abs 0.8 0.7 - 4.0 K/uL   Monocytes Relative 6 %   Monocytes Absolute 0.1 0.1 - 1.0 K/uL   Eosinophils Relative 0 %   Eosinophils Absolute 0.0 0.0 - 0.5 K/uL   Basophils Relative 2 %   Basophils Absolute 0.1 0.0 - 0.1 K/uL   Immature Granulocytes 0 %   Abs Immature Granulocytes 0.01 0.00 - 0.07 K/uL    Comment: Performed at [facility], [address] [place], [place] [address]  Protime-INR     Status: Abnormal   Collection Time: 01/25/23 12:19 AM  Result Value Ref Range   Prothrombin Time 15.3  (H) 11.4 - 15.2 seconds   INR 1.2 0.8 - 1.2    Comment: (NOTE) INR goal varies based on device and disease states. Performed at [facility], [address] [place], [place] [address]   SARS Coronavirus 2 by RT PCR (hospital order, performed in [facility] hospital lab) *cepheid single result test* Anterior Nasal Swab     Status: None   Collection Time: 01/25/23  2:07 AM   Specimen: Anterior Nasal Swab  Result Value Ref Range   SARS Coronavirus 2 by RT PCR NEGATIVE NEGATIVE    Comment: (NOTE) SARS-CoV-2 target nucleic acids are NOT DETECTED.  The SARS-CoV-2 RNA is generally detectable in upper and lower respiratory specimens during the acute phase of infection. The lowest concentration of SARS-CoV-2 viral copies this assay can detect is 250 copies / mL. A negative result does not preclude SARS-CoV-2 infection and should not be used as the sole basis for treatment or other patient management decisions.  A negative result may occur with improper specimen collection / handling, submission of specimen other than nasopharyngeal swab, presence of viral mutation(s) within the areas targeted by this assay, and inadequate number of viral copies (<250 copies / mL). A negative result must be combined with clinical observations, patient history, and epidemiological information.  Fact Sheet for Patients:   RoadLapTop.co.za  Fact Sheet for Healthcare Providers: http://kim-miller.com/  This test is not yet approved or  cleared by the [place] FDA and has been authorized for detection and/or diagnosis of SARS-CoV-2 by FDA under an Emergency Use Authorization (EUA).  This EUA will remain in effect (meaning this test can be used) for the duration of the COVID-19 declaration under Section 564(b)(1) of the Act, 21 U.S.C. section 360bbb-3(b)(1), unless the authorization is terminated or revoked sooner.  Performed at [facility], [address], [place], [place] [address]   Urinalysis, w/ Reflex to Culture (Infection Suspected) -Urine, Clean Catch     Status: Abnormal   Collection Time: 01/25/23  2:24 AM  Result Value Ref Range   Specimen Source URINE, CATHETERIZED    Color, Urine YELLOW YELLOW   APPearance CLEAR CLEAR   Specific Gravity, Urine 1.016 1.005 - 1.030   pH 6.0 5.0 - 8.0   Glucose, UA NEGATIVE NEGATIVE mg/dL   Hgb urine dipstick NEGATIVE NEGATIVE   Bilirubin Urine NEGATIVE NEGATIVE   Ketones, ur NEGATIVE NEGATIVE mg/dL   Protein, ur NEGATIVE NEGATIVE mg/dL   Nitrite NEGATIVE NEGATIVE   Leukocytes,Ua SMALL (A) NEGATIVE   RBC / HPF 0-5 0 - 5 RBC/hpf   WBC, UA 11-20 0 - 5 WBC/hpf    Comment:        Reflex urine culture not performed if WBC <=10, OR if Squamous epithelial cells >5. If Squamous epithelial cells >5 suggest recollection.    Bacteria, UA RARE (A) NONE SEEN   Squamous Epithelial / HPF 0-5 0 - 5 /HPF   Mucus PRESENT    Hyaline Casts, UA PRESENT     Comment: Performed at [facility], [address], [place], [place] [address]  I-Stat Lactic Acid, ED     Status: Abnormal   Collection Time: 01/25/23  2:39 AM  Result Value Ref Range   Lactic Acid, Venous <0.3 (L) 0.5 - 1.9 mmol/L   DG Chest Port 1 View  Result Date: 01/25/2023 CLINICAL DATA:  [id number] with sepsis. Undergoing chemotherapy for cancer. EXAM: PORTABLE CHEST 1 VIEW COMPARISON:  CTA chest 01/10/2023 FINDINGS: The heart size and mediastinal contours are within normal limits. Both lungs are clear. The visualized skeletal structures are unremarkable. There are left axillary surgical clips. There is a right chest port with  IJ approach catheter, the catheter tip at the superior cavoatrial junction. Compare: Stable chest. IMPRESSION: No active disease. Right chest port unchanged. Electronically Signed   By: Almira Bar M.D.   On: 01/25/2023 00:36    Pending Labs Unresulted Labs (From  admission, onward)     Start     Ordered   01/26/23 0500  CBC  Tomorrow morning,   R        01/25/23 0720   01/26/23 0500  Comprehensive metabolic panel  Tomorrow morning,   R        01/25/23 0720   01/25/23 0709  Magnesium  Add-on,   AD        01/25/23 0708   01/25/23 0709  Phosphorus  Add-on,   AD        01/25/23 0708   01/25/23 0224  Urine Culture  Once,   R        01/25/23 0224   01/24/23 2324  Culture, blood (Routine x 2)  BLOOD CULTURE X 2,   R (with STAT occurrences),   Status:  Canceled      01/24/23 2323            Vitals/Pain Today's Vitals   01/25/23 0645 01/25/23 0651 01/25/23 0700 01/25/23 0725  BP:   (!) 101/58 116/67  Pulse: 80 85 75 80  Resp: 18 16 16 17   Temp:    98.3 F (36.8 C)  TempSrc:    Oral  SpO2: 96% 96% 98% 99%  Weight:      Height:      PainSc:        Isolation Precautions No active isolations  Medications Medications  ondansetron (ZOFRAN) injection 4 mg (has no administration in time range)  acetaminophen (TYLENOL) tablet 650 mg (has no administration in time range)  0.9 % NaCl with KCl 20 mEq/ L  infusion ( Intravenous New Bag/Given 01/25/23 0547)  potassium chloride SA (KLOR-CON M) CR tablet 40 mEq (has no administration in time range)  ceFEPIme (MAXIPIME) 2 g in sodium chloride 0.9 % 100 mL IVPB (has no administration in time range)  ceFEPIme (MAXIPIME) 2 g in sodium chloride 0.9 % 100 mL IVPB (0 g Intravenous Stopped 01/25/23 0545)    Mobility walks     Focused Assessments   R Recommendations: See Admitting Provider Note  Report given to:   Additional Notes:

## 2023-01-25 NOTE — ED Provider Notes (Signed)
[place] [facility]MENT AT [facility] Provider Note   CSN: [id number] Arrival date & time: 01/24/23  2302     History  Chief Complaint  Patient presents with   Fever    Gabriela Sullivan is a 33 y.o. female with history of metastatic breast cancer who presents to the emergency department for fever of 104 F at home in context of recent chemotherapy.  Per chart review patient was also recently admitted for febrile neutropenia from 8/8-8/12.  No clear source for infection at that time, suspected fever malignancy as etiology.  Tylenol immediately prior to arrival.  Some runny nose but otherwise patient reports being asymptomatic at this time.  In addition to above listed history she has history of type 2 diabetes and NASH.  No anticoagulation  HPI     Home Medications Prior to Admission medications   Medication Sig Start Date End Date Taking? Authorizing Provider  abemaciclib (VERZENIO) 100 MG tablet Take 1 tablet (100 mg total) by mouth 2 (two) times daily. Swallow tablets whole. Do not chew, crush, or split tablets before swallowing. 10/08/22   [name], MD  anastrozole (ARIMIDEX) 1 MG tablet Take 1 tablet (1 mg total) by mouth daily. 04/09/22   [name], MD  calcium carbonate (TUMS - DOSED IN MG ELEMENTAL CALCIUM) 500 MG chewable tablet Chew 2 tablets by mouth every other day.    [provider]  MOUNJARO 5 MG/0.5ML Pen Inject 5 mg into the skin once a week. 11/21/22   [provider]  omeprazole (PRILOSEC) 20 MG capsule Take 1 capsule (20 mg total) by mouth daily. 08/04/21   Antony Madura, PA-C  ondansetron (ZOFRAN-ODT) 4 MG disintegrating tablet Take 1 tablet (4 mg total) by mouth every 8 (eight) hours as needed for nausea or vomiting. 10/26/21   Benjiman Core, MD  potassium chloride (KLOR-CON) 10 MEQ tablet Take 1 tablet (10 mEq total) by mouth daily. 01/05/23   Prosperi, Christian H, PA-C  Vitamin D, Ergocalciferol, (DRISDOL) 1.25 MG (50000  UNIT) CAPS capsule Take 50,000 Units by mouth once a week. 06/20/22   [provider]  prochlorperazine (COMPAZINE) 10 MG tablet Take 1 tablet (10 mg total) by mouth every 6 (six) hours as needed (Nausea or vomiting). 10/04/20 12/21/20  Magrinat, Valentino Hue, MD      Allergies    Patient has no known allergies.    Review of Systems   Review of Systems  Constitutional:  Positive for fever.  HENT:  Positive for rhinorrhea.   Respiratory: Negative.    Cardiovascular: Negative.   Gastrointestinal: Negative.   Genitourinary: Negative.   Neurological: Negative.     Physical Exam Updated Vital Signs BP 110/66 (BP Location: Right Arm)   Pulse (!) 102   Temp 99 F (37.2 C) (Oral)   Resp 16   Ht 5' (1.524 m)   Wt 79.4 kg   SpO2 100%   BMI 34.18 kg/m  Physical Exam Vitals and nursing note reviewed.  Constitutional:      Appearance: She is obese. She is not ill-appearing or toxic-appearing.  HENT:     Head: Normocephalic and atraumatic.     Mouth/Throat:     Mouth: Mucous membranes are moist.     Pharynx: No oropharyngeal exudate or posterior oropharyngeal erythema.  Eyes:     General:        Right eye: No discharge.        Left eye: No discharge.     Extraocular  Movements: Extraocular movements intact.     Conjunctiva/sclera: Conjunctivae normal.     Pupils: Pupils are equal, round, and reactive to light.  Cardiovascular:     Rate and Rhythm: Normal rate and regular rhythm.     Pulses: Normal pulses.     Heart sounds: Normal heart sounds. No murmur heard. Pulmonary:     Effort: Pulmonary effort is normal. No respiratory distress.     Breath sounds: Normal breath sounds. No wheezing or rales.  Chest:       Comments: Status post bilateral mastectomy Abdominal:     General: Bowel sounds are normal. There is no distension.     Palpations: Abdomen is soft.     Tenderness: There is no abdominal tenderness. There is no guarding or rebound.  Musculoskeletal:         General: No deformity.     Cervical back: Neck supple.  Skin:    General: Skin is warm and dry.     Capillary Refill: Capillary refill takes less than 2 seconds.  Neurological:     General: No focal deficit present.     Mental Status: She is alert and oriented to person, place, and time. Mental status is at baseline.  Psychiatric:        Mood and Affect: Mood normal.     ED Results / Procedures / Treatments   Labs (all labs ordered are listed, but only abnormal results are displayed) Labs Reviewed  I-STAT CG4 LACTIC ACID, ED - Abnormal; Notable for the following components:      Result Value   Lactic Acid, Venous 0.3 (*)    All other components within normal limits  CULTURE, BLOOD (ROUTINE X 2)  CULTURE, BLOOD (ROUTINE X 2)  COMPREHENSIVE METABOLIC PANEL  CBC WITH DIFFERENTIAL/PLATELET  PROTIME-INR  URINALYSIS, W/ REFLEX TO CULTURE (INFECTION SUSPECTED)  HCG, SERUM, QUALITATIVE    EKG None  Radiology No results found.  Procedures .Critical Care  Performed by: Paris Lore, PA-C Authorized by: Paris Lore, PA-C   Critical care provider statement:    Critical care time (minutes):  45   Critical care was time spent personally by me on the following activities:  Development of treatment plan with patient or surrogate, discussions with consultants, evaluation of patient's response to treatment, examination of patient, obtaining history from patient or surrogate, ordering and performing treatments and interventions, ordering and review of laboratory studies, ordering and review of radiographic studies, pulse oximetry and re-evaluation of patient's condition     Medications Ordered in ED Medications - No data to display  ED Course/ Medical Decision Making/ A&P Clinical Course as of 01/25/23 0422  Sat Jan 25, 2023  0419 Consult to hospitalist Dr. Joneen Roach who is agreeable to admitting this patient to her service.  I appreciate her collaboration care of  this patient. [RS]    Clinical Course User Index [RS] Chalsey Leeth, Eugene Gavia, PA-C                                 Medical Decision Making 32 year old female immunocompromised secondary to chemotherapy for metastatic breast cancer who presents with concern for fever at home.  Afebrile here.  Vital signs reassuring.  Cardiopulmonary imbalances are benign.  Patient overall well-appearing.  DDx includes not limited to tumor related fever, infectious etiology, tumor lysis syndrome, URI.  Amount and/or Complexity of Data Reviewed Labs: ordered.    Details: CBC with  pancytopenia with white count of 2.5, hemoglobin of 8.9, and thrombocytopenia of 98.  CMP with hypokalemia of 3, UA not convincing for infection, COVID test is negative, lactic is normal.  Pregnancy test is negative.   Radiology:     Details: Chest x-ray is negative for acute cardiopulmonary disease  Risk Decision regarding hospitalization.   Worsening neutropenia from time of hospitalization, given persistent fever of unknown origin cefepime was initiated and consult has been placed to hospitalist medicine.   Consult to Dr. Joneen Roach as above.  Pipper and her parents voiced understanding of her medical evaluation and treatment plan. Each of their questions answered to their expressed satisfaction.  Return precautions were given.  Patient is well-appearing, stable, and was discharged in good condition.  This chart was dictated using voice recognition software, Dragon. Despite the best efforts of this provider to proofread and correct errors, errors may still occur which can change documentation meaning.   Final Clinical Impression(s) / ED Diagnoses Final diagnoses:  None    Rx / DC Orders ED Discharge Orders     None         Sherrilee Gilles 01/25/23 Daryll Drown, MD 01/25/23 [phone]

## 2023-01-25 NOTE — Plan of Care (Deleted)
Problem: Fluid Volume: Goal: Hemodynamic stability will improve 01/25/2023 1909 by Kathryne Eriksson, RN Outcome: Progressing 01/25/2023 1909 by Kathryne Eriksson, RN Outcome: Progressing   Problem: Clinical Measurements: Goal: Diagnostic test results will improve 01/25/2023 1909 by Kathryne Eriksson, RN Outcome: Progressing 01/25/2023 1909 by Kathryne Eriksson, RN Outcome: Progressing Goal: Signs and symptoms of infection will decrease 01/25/2023 1909 by Kathryne Eriksson, RN Outcome: Progressing 01/25/2023 1909 by Kathryne Eriksson, RN Outcome: Progressing   Problem: Respiratory: Goal: Ability to maintain adequate ventilation will improve 01/25/2023 1909 by Kathryne Eriksson, RN Outcome: Progressing 01/25/2023 1909 by Kathryne Eriksson, RN Outcome: Progressing   Problem: Education: Goal: Knowledge of General Education information will improve Description: Including pain rating scale, medication(s)/side effects and non-pharmacologic comfort measures 01/25/2023 1909 by Kathryne Eriksson, RN Outcome: Progressing 01/25/2023 1909 by Kathryne Eriksson, RN Outcome: Progressing   Problem: Health Behavior/Discharge Planning: Goal: Ability to manage health-related needs will improve 01/25/2023 1909 by Kathryne Eriksson, RN Outcome: Progressing 01/25/2023 1909 by Kathryne Eriksson, RN Outcome: Progressing   Problem: Clinical Measurements: Goal: Ability to maintain clinical measurements within normal limits will improve 01/25/2023 1909 by Kathryne Eriksson, RN Outcome: Progressing 01/25/2023 1909 by Kathryne Eriksson, RN Outcome: Progressing Goal: Will remain free from infection 01/25/2023 1909 by Kathryne Eriksson, RN Outcome: Progressing 01/25/2023 1909 by Kathryne Eriksson, RN Outcome: Progressing Goal: Diagnostic test results will improve 01/25/2023 1909 by Kathryne Eriksson, RN Outcome: Progressing 01/25/2023 1909 by Kathryne Eriksson, RN Outcome: Progressing Goal: Respiratory complications will improve 01/25/2023 1909 by Kathryne Eriksson, RN Outcome: Progressing 01/25/2023 1909 by Kathryne Eriksson, RN Outcome: Progressing Goal: Cardiovascular complication will be avoided 01/25/2023 1909 by Kathryne Eriksson, RN Outcome: Progressing 01/25/2023 1909 by Kathryne Eriksson, RN Outcome: Progressing   Problem: Activity: Goal: Risk for activity intolerance will decrease 01/25/2023 1909 by Kathryne Eriksson, RN Outcome: Progressing 01/25/2023 1909 by Kathryne Eriksson, RN Outcome: Progressing   Problem: Nutrition: Goal: Adequate nutrition will be maintained 01/25/2023 1909 by Kathryne Eriksson, RN Outcome: Progressing 01/25/2023 1909 by Kathryne Eriksson, RN Outcome: Progressing   Problem: Coping: Goal: Level of anxiety will decrease 01/25/2023 1909 by Kathryne Eriksson, RN Outcome: Progressing 01/25/2023 1909 by Kathryne Eriksson, RN Outcome: Progressing   Problem: Elimination: Goal: Will not experience complications related to bowel motility 01/25/2023 1909 by Kathryne Eriksson, RN Outcome: Progressing 01/25/2023 1909 by Kathryne Eriksson, RN Outcome: Progressing Goal: Will not experience complications related to urinary retention 01/25/2023 1909 by Kathryne Eriksson, RN Outcome: Progressing 01/25/2023 1909 by Kathryne Eriksson, RN Outcome: Progressing   Problem: Pain Managment: Goal: General experience of comfort will improve 01/25/2023 1909 by Kathryne Eriksson, RN Outcome: Progressing 01/25/2023 1909 by Kathryne Eriksson, RN Outcome: Progressing   Problem: Safety: Goal: Ability to remain free from injury will improve 01/25/2023 1909 by Kathryne Eriksson, RN Outcome: Progressing 01/25/2023 1909 by Kathryne Eriksson, RN Outcome: Progressing   Problem: Skin Integrity: Goal: Risk for impaired skin integrity will decrease 01/25/2023 1909 by Kathryne Eriksson, RN Outcome: Progressing 01/25/2023 1909 by Kathryne Eriksson, RN Outcome: Progressing   Problem: Education: Goal: Ability to describe self-care measures that may prevent or decrease complications  (Diabetes Survival Skills Education) will improve 01/25/2023 1909 by Kathryne Eriksson, RN Outcome: Progressing 01/25/2023 1909 by Kathryne Eriksson, RN Outcome: Progressing Goal: Individualized Educational Video(s) 01/25/2023 1909 by Kathryne Eriksson,  RN Outcome: Progressing 01/25/2023 1909 by Kathryne Eriksson, RN Outcome: Progressing   Problem: Coping: Goal: Ability to adjust to condition or change in health will improve 01/25/2023 1909 by Kathryne Eriksson, RN Outcome: Progressing 01/25/2023 1909 by Kathryne Eriksson, RN Outcome: Progressing   Problem: Fluid Volume: Goal: Ability to maintain a balanced intake and output will improve 01/25/2023 1909 by Kathryne Eriksson, RN Outcome: Progressing 01/25/2023 1909 by Kathryne Eriksson, RN Outcome: Progressing   Problem: Health Behavior/Discharge Planning: Goal: Ability to identify and utilize available resources and services will improve 01/25/2023 1909 by Kathryne Eriksson, RN Outcome: Progressing 01/25/2023 1909 by Kathryne Eriksson, RN Outcome: Progressing Goal: Ability to manage health-related needs will improve 01/25/2023 1909 by Kathryne Eriksson, RN Outcome: Progressing 01/25/2023 1909 by Kathryne Eriksson, RN Outcome: Progressing   Problem: Metabolic: Goal: Ability to maintain appropriate glucose levels will improve 01/25/2023 1909 by Kathryne Eriksson, RN Outcome: Progressing 01/25/2023 1909 by Kathryne Eriksson, RN Outcome: Progressing   Problem: Nutritional: Goal: Maintenance of adequate nutrition will improve 01/25/2023 1909 by Kathryne Eriksson, RN Outcome: Progressing 01/25/2023 1909 by Kathryne Eriksson, RN Outcome: Progressing Goal: Progress toward achieving an optimal weight will improve 01/25/2023 1909 by Kathryne Eriksson, RN Outcome: Progressing 01/25/2023 1909 by Kathryne Eriksson, RN Outcome: Progressing   Problem: Skin Integrity: Goal: Risk for impaired skin integrity will decrease 01/25/2023 1909 by Kathryne Eriksson, RN Outcome: Progressing 01/25/2023 1909  by Kathryne Eriksson, RN Outcome: Progressing   Problem: Tissue Perfusion: Goal: Adequacy of tissue perfusion will improve 01/25/2023 1909 by Kathryne Eriksson, RN Outcome: Progressing 01/25/2023 1909 by Kathryne Eriksson, RN Outcome: Progressing

## 2023-01-26 DIAGNOSIS — D709 Neutropenia, unspecified: Secondary | ICD-10-CM | POA: Diagnosis not present

## 2023-01-26 DIAGNOSIS — R5081 Fever presenting with conditions classified elsewhere: Secondary | ICD-10-CM | POA: Diagnosis not present

## 2023-01-26 LAB — CBC WITH DIFFERENTIAL/PLATELET
Abs Immature Granulocytes: 0 10*3/uL (ref 0.00–0.07)
Basophils Absolute: 0.1 10*3/uL (ref 0.0–0.1)
Basophils Relative: 3 %
Eosinophils Absolute: 0 10*3/uL (ref 0.0–0.5)
Eosinophils Relative: 1 %
HCT: 29.3 % — ABNORMAL LOW (ref 36.0–46.0)
Hemoglobin: 10.2 g/dL — ABNORMAL LOW (ref 12.0–15.0)
Immature Granulocytes: 0 %
Lymphocytes Relative: 54 %
Lymphs Abs: 1 10*3/uL (ref 0.7–4.0)
MCH: 35.9 pg — ABNORMAL HIGH (ref 26.0–34.0)
MCHC: 34.8 g/dL (ref 30.0–36.0)
MCV: 103.2 fL — ABNORMAL HIGH (ref 80.0–100.0)
Monocytes Absolute: 0.1 10*3/uL (ref 0.1–1.0)
Monocytes Relative: 7 %
Neutro Abs: 0.7 10*3/uL — ABNORMAL LOW (ref 1.7–7.7)
Neutrophils Relative %: 35 %
Platelets: 116 10*3/uL — ABNORMAL LOW (ref 150–400)
RBC: 2.84 MIL/uL — ABNORMAL LOW (ref 3.87–5.11)
RDW: 16.4 % — ABNORMAL HIGH (ref 11.5–15.5)
WBC: 1.9 10*3/uL — ABNORMAL LOW (ref 4.0–10.5)
nRBC: 0 % (ref 0.0–0.2)

## 2023-01-26 LAB — COMPREHENSIVE METABOLIC PANEL
ALT: 25 U/L (ref 0–44)
AST: 29 U/L (ref 15–41)
Albumin: 3 g/dL — ABNORMAL LOW (ref 3.5–5.0)
Alkaline Phosphatase: 45 U/L (ref 38–126)
Anion gap: 3 — ABNORMAL LOW (ref 5–15)
BUN: 11 mg/dL (ref 6–20)
CO2: 26 mmol/L (ref 22–32)
Calcium: 8.1 mg/dL — ABNORMAL LOW (ref 8.9–10.3)
Chloride: 110 mmol/L (ref 98–111)
Creatinine, Ser: 0.69 mg/dL (ref 0.44–1.00)
GFR, Estimated: >60 mL/min (ref >60)
Glucose, Bld: 104 mg/dL — ABNORMAL HIGH (ref 70–99)
Potassium: 3.8 mmol/L (ref 3.5–5.1)
Sodium: 139 mmol/L (ref 135–145)
Total Bilirubin: 0.6 mg/dL (ref 0.3–1.2)
Total Protein: 6.3 g/dL — ABNORMAL LOW (ref 6.5–8.1)

## 2023-01-26 LAB — URINALYSIS, W/ REFLEX TO CULTURE (INFECTION SUSPECTED)
Bacteria, UA: NONE SEEN
Bilirubin Urine: NEGATIVE
Glucose, UA: NEGATIVE mg/dL
Hgb urine dipstick: NEGATIVE
Ketones, ur: NEGATIVE mg/dL
Leukocytes,Ua: NEGATIVE
Nitrite: NEGATIVE
Protein, ur: NEGATIVE mg/dL
Specific Gravity, Urine: 1.014 (ref 1.005–1.030)
pH: 6 (ref 5.0–8.0)

## 2023-01-26 LAB — CBC
HCT: 25.6 % — ABNORMAL LOW (ref 36.0–46.0)
Hemoglobin: 8.8 g/dL — ABNORMAL LOW (ref 12.0–15.0)
MCH: 35.8 pg — ABNORMAL HIGH (ref 26.0–34.0)
MCHC: 34.4 g/dL (ref 30.0–36.0)
MCV: 104.1 fL — ABNORMAL HIGH (ref 80.0–100.0)
Platelets: 95 10*3/uL — ABNORMAL LOW (ref 150–400)
RBC: 2.46 MIL/uL — ABNORMAL LOW (ref 3.87–5.11)
RDW: 16.3 % — ABNORMAL HIGH (ref 11.5–15.5)
WBC: 1.9 10*3/uL — ABNORMAL LOW (ref 4.0–10.5)
nRBC: 0 % (ref 0.0–0.2)

## 2023-01-26 LAB — URINE CULTURE

## 2023-01-26 LAB — GLUCOSE, CAPILLARY
Glucose-Capillary: 87 mg/dL (ref 70–99)
Glucose-Capillary: 101 mg/dL — ABNORMAL HIGH (ref 70–99)
Glucose-Capillary: 90 mg/dL (ref 70–99)
Glucose-Capillary: 92 mg/dL (ref 70–99)

## 2023-01-26 LAB — VITAMIN D 25 HYDROXY (VIT D DEFICIENCY, FRACTURES): Vit D, 25-Hydroxy: 33.03 ng/mL (ref 30–100)

## 2023-01-26 MED ORDER — ONDANSETRON 4 MG PO TBDP
4.0000 mg | ORAL_TABLET | Freq: Four times a day (QID) | ORAL | Status: DC | PRN
  Administered 2023-01-26 – 2023-01-27 (×2): 4 mg via ORAL
  Filled 2023-01-26 (×2): qty 1

## 2023-01-26 NOTE — Progress Notes (Addendum)
PROGRESS NOTE    Gabriela Sullivan  ZOX:[id number]  DOB: Feb 02, 1991  DOA: 01/24/2023 PCP: Leane Call, MD Outpatient Specialists:   Hospital course:  32 year old female with breast cancer S/P bilateral mastectomy, XRT, migraine headaches, prediabetes and hepatic steatosis was admitted last night for reported fever to 104.  Infectious workup in the ED was unrevealing.  Patient was admitted for neutropenic fever and started on cefepime.   Subjective:  Patient states she does feel better because she no longer has a fever.  Is not sure how she feels better.  Did have a migraine yesterday but notes that is also receding.  Denies photophobia or any difficulty moving her neck.   Objective: Vitals:   01/25/23 1555 01/25/23 2032 01/26/23 0559 01/26/23 1350  BP: (!) 114/58 122/67 100/63 105/67  Pulse: 86 79 79 81  Resp: 20   16  Temp: 98.3 F (36.8 C) 99 F (37.2 C) 98.2 F (36.8 C) 98.2 F (36.8 C)  TempSrc: Oral Oral Oral Oral  SpO2: 98% 99% 98% 99%  Weight:      Height:        Intake/Output Summary (Last 24 hours) at 01/26/2023 1833 Last data filed at 01/26/2023 1520 Gross per 24 hour  Intake 640 ml  Output --  Net 640 ml   Filed Weights   01/24/23 2317  Weight: 79.4 kg     Exam:  General: Remarkably well-appearing female sitting up in bed in NAD with attentive family at bedside Eyes: sclera anicteric, conjuctiva mild injection bilaterally, patient moving neck normally with no evidence of discomfort able to look at the light without difficulty CVS: S1-S2, regular  Respiratory:  decreased air entry bilaterally secondary to decreased inspiratory effort, rales at bases  GI: NABS, soft, NT  LE: Warm and well-perfused Neuro: A/O x 3,  grossly nonfocal.  Psych: patient is logical and coherent, judgement and insight appear normal, mood and affect appropriate to situation.  Data Reviewed:  Basic Metabolic Panel: Recent Labs  Lab 01/25/23 0019 01/26/23 0500  NA  137 139  K 3.0* 3.8  CL 109 110  CO2 24 26  GLUCOSE 92 104*  BUN 14 11  CREATININE 0.76 0.69  CALCIUM 8.0* 8.1*  MG 2.0  --   PHOS 3.8  --     CBC: Recent Labs  Lab 01/25/23 0019 01/26/23 0500 01/26/23 0758  WBC 2.5* 1.9* 1.9*  NEUTROABS 1.5*  --  0.7*  HGB 8.9* 8.8* 10.2*  HCT 25.4* 25.6* 29.3*  MCV 102.4* 104.1* 103.2*  PLT 98* 95* 116*     Scheduled Meds:  anastrozole  1 mg Oral Daily   calcium carbonate  2 tablet Oral QODAY   Chlorhexidine Gluconate Cloth  6 each Topical Daily   insulin aspart  0-15 Units Subcutaneous TID WC   Continuous Infusions:  0.9 % NaCl with KCl 20 mEq / L 100 mL/hr at 01/26/23 1426   ceFEPime (MAXIPIME) IV 2 g (01/26/23 1329)     Assessment & Plan:   Neutropenic fever Reported 104 at home Tmax here has been 98.3 Workup thus far has been unrevealing As to x-rays negative She does have small leuk esterase and rare bacteria which may be the source given immunosuppression Urine culture shows multiple species, likely poor specimen Will request repeat UA although she has already gotten antibiotics Patient denies diarrhea although chart alerts made to loose stool, C. difficile is ordered Continue cefepime day #2, feels clinically improved Blood ultures are pending  Hypokalemia Resolved with repletion yesterday  Hypocalcemia Continue calcium supplementation  DM 2 Blood sugars under good control on carb modified diet  Breast CA Continue present management per oncology  Nonalcoholic steatohepatitis LFTs are within normal limits    DVT prophylaxis: SCD Code Status: Full code Family Communication: Family at bedside throughout       Studies: DG Chest Port 1 View  Result Date: 01/25/2023 CLINICAL DATA:  [id number] with sepsis. Undergoing chemotherapy for cancer. EXAM: PORTABLE CHEST 1 VIEW COMPARISON:  CTA chest 01/10/2023 FINDINGS: The heart size and mediastinal contours are within normal limits. Both lungs are clear. The  visualized skeletal structures are unremarkable. There are left axillary surgical clips. There is a right chest port with IJ approach catheter, the catheter tip at the superior cavoatrial junction. Compare: Stable chest. IMPRESSION: No active disease. Right chest port unchanged. Electronically Signed   By: Almira Bar M.D.   On: 01/25/2023 00:36    Principal Problem:   Febrile neutropenia (HCC) Active Problems:   Malignant neoplasm of upper-outer quadrant of left breast in female, estrogen receptor positive (HCC)   Lytic bone lesions on xray   Nonalcoholic steatohepatitis   Type 2 diabetes mellitus (HCC)   Pancytopenia (HCC)   Hypokalemia   Hypocalcemia     Gabriela Sullivan, [facility]  If 7PM-7AM, please contact night-coverage [email or web address]   LOS: 1 day

## 2023-01-26 NOTE — Plan of Care (Signed)

## 2023-01-27 DIAGNOSIS — D709 Neutropenia, unspecified: Secondary | ICD-10-CM | POA: Diagnosis not present

## 2023-01-27 DIAGNOSIS — R5081 Fever presenting with conditions classified elsewhere: Secondary | ICD-10-CM | POA: Diagnosis not present

## 2023-01-27 LAB — BASIC METABOLIC PANEL
Anion gap: 3 — ABNORMAL LOW (ref 5–15)
BUN: 13 mg/dL (ref 6–20)
CO2: 26 mmol/L (ref 22–32)
Calcium: 8.3 mg/dL — ABNORMAL LOW (ref 8.9–10.3)
Chloride: 109 mmol/L (ref 98–111)
Creatinine, Ser: 0.7 mg/dL (ref 0.44–1.00)
GFR, Estimated: >60 mL/min (ref >60)
Glucose, Bld: 100 mg/dL — ABNORMAL HIGH (ref 70–99)
Potassium: 3.8 mmol/L (ref 3.5–5.1)
Sodium: 138 mmol/L (ref 135–145)

## 2023-01-27 LAB — CBC WITH DIFFERENTIAL/PLATELET
Abs Immature Granulocytes: 0 10*3/uL (ref 0.00–0.07)
Basophils Absolute: 0 10*3/uL (ref 0.0–0.1)
Basophils Relative: 2 %
Eosinophils Absolute: 0 10*3/uL (ref 0.0–0.5)
Eosinophils Relative: 2 %
HCT: 26.4 % — ABNORMAL LOW (ref 36.0–46.0)
Hemoglobin: 9.2 g/dL — ABNORMAL LOW (ref 12.0–15.0)
Immature Granulocytes: 0 %
Lymphocytes Relative: 55 %
Lymphs Abs: 1 10*3/uL (ref 0.7–4.0)
MCH: 35.9 pg — ABNORMAL HIGH (ref 26.0–34.0)
MCHC: 34.8 g/dL (ref 30.0–36.0)
MCV: 103.1 fL — ABNORMAL HIGH (ref 80.0–100.0)
Monocytes Absolute: 0.2 10*3/uL (ref 0.1–1.0)
Monocytes Relative: 10 %
Neutro Abs: 0.6 10*3/uL — ABNORMAL LOW (ref 1.7–7.7)
Neutrophils Relative %: 31 %
Platelets: 105 10*3/uL — ABNORMAL LOW (ref 150–400)
RBC: 2.56 MIL/uL — ABNORMAL LOW (ref 3.87–5.11)
RDW: 16.3 % — ABNORMAL HIGH (ref 11.5–15.5)
WBC: 1.8 10*3/uL — ABNORMAL LOW (ref 4.0–10.5)
nRBC: 0 % (ref 0.0–0.2)

## 2023-01-27 LAB — C DIFFICILE QUICK SCREEN W PCR REFLEX
C Diff antigen: NEGATIVE
C Diff interpretation: NOT DETECTED
C Diff toxin: NEGATIVE

## 2023-01-27 LAB — GLUCOSE, CAPILLARY
Glucose-Capillary: 98 mg/dL (ref 70–99)
Glucose-Capillary: 86 mg/dL (ref 70–99)
Glucose-Capillary: 101 mg/dL — ABNORMAL HIGH (ref 70–99)
Glucose-Capillary: 101 mg/dL — ABNORMAL HIGH (ref 70–99)

## 2023-01-27 MED ORDER — TBO-FILGRASTIM 300 MCG/0.5ML ~~LOC~~ SOSY
300.0000 ug | PREFILLED_SYRINGE | Freq: Every day | SUBCUTANEOUS | Status: DC
  Administered 2023-01-27: 300 ug via SUBCUTANEOUS
  Filled 2023-01-27 (×2): qty 0.5

## 2023-01-27 NOTE — Progress Notes (Signed)
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  Sepsis on admission-had leukopenia-was given cefepime Flagyl and switched to cefdinir Flagyl to complete 7 days as an outpatient Returned to ED 8/23 with 104.0 fever-WBC 2.5 hemoglobin 8.9 platelet 98 Hypokalemic to 3.0 pregnancy test negative   Plan  Inflammatory/infectious/ Immunotherapy related diarrhea Get Fecal actoferrin to differentiate Await C. difficile testing add on GI pathogen panel as well  Febrile neutropenia underlying cancer  alerted Dr. Pamelia Hoit who is aware Granix 300 X 3 days and follow counts from tomorrow-limit fresh fruits vegetables and keep on neutropenic precautions Blood culture X2 on 8/24 is negative Urine culture 8/24 multiple CFU Continue cefepime for now may be narrow in the next 24 hours to ceftriaxone versus other depending on cultures  Nash steatosis Repeat labs periodically  Hypokalemia Recheck labs periodically Stop fluid containing K today  DVT prophylaxis: Lovenox  Status is: Inpatient Remains inpatient appropriate because:   Still has febrile neutropenia    Subjective:  Coherent pleasant some fever overnight no chest pain 1 soft stool today  Objective + exam Vitals:   01/25/23 2032 01/26/23 0559 01/26/23 1350 01/27/23 0617  BP: 122/67 100/63 105/67 112/75  Pulse: 79 79 81 73  Resp:   16 16  Temp: 99 F (37.2 C) 98.2 F (36.8 C) 98.2 F (36.8 C) 98.1 F (36.7 C)  TempSrc: Oral Oral Oral Oral  SpO2: 99% 98% 99% 99%  Weight:       Height:       Filed Weights   01/24/23 2317  Weight: 79.4 kg    Examination:  EOMI NCAT no focal deficit Neck soft supple ROM Abdomen soft no rebound no Neuro intact moving 4 limbs equally   Data Reviewed: reviewed   CBC    Component Value Date/Time   WBC 1.8 (L) 01/27/2023 0553   RBC 2.56 (L) 01/27/2023 0553   HGB 9.2 (L) 01/27/2023 0553   HGB 12.9 12/31/2022 0737   HCT 26.4 (L) 01/27/2023 0553   PLT 105 (L) 01/27/2023 0553   PLT 189 12/31/2022 0737   MCV 103.1 (H) 01/27/2023 0553   MCH 35.9 (H) 01/27/2023 0553   MCHC 34.8 01/27/2023 0553   RDW 16.3 (H) 01/27/2023 0553   LYMPHSABS 1.0 01/27/2023 0553   MONOABS 0.2 01/27/2023 0553   EOSABS 0.0 01/27/2023 0553   BASOSABS 0.0 01/27/2023 0553      Latest Ref Rng & Units 01/27/2023    5:53 AM 01/26/2023    5:00 AM 01/25/2023   12:19 AM  CMP  Glucose 70 - 99 mg/dL 956  213  92   BUN 6 - 20 mg/dL 13  11  14    Creatinine 0.44 - 1.00 mg/dL 0.86  5.78  4.69   Sodium 135 - 145 mmol/L 138  139  137   Potassium 3.5 - 5.1 mmol/L 3.8  3.8  3.0   Chloride 98 - 111 mmol/L 109  110  109   CO2 22 - 32 mmol/L 26  26  24    Calcium  8.9 - 10.3 mg/dL 8.3  8.1  8.0   Total Protein 6.5 - 8.1 g/dL  6.3  6.9   Total Bilirubin 0.3 - 1.2 mg/dL  0.6  0.6   Alkaline Phos 38 - 126 U/L  45  51   AST 15 - 41 U/L  29  31   ALT 0 - 44 U/L  25  29      Scheduled Meds:  anastrozole  1 mg Oral Daily   calcium carbonate  2 tablet Oral QODAY   Chlorhexidine Gluconate Cloth  6 each Topical Daily   insulin aspart  0-15 Units Subcutaneous TID WC   Tbo-filgastrim (GRANIX) SQ  300 mcg Subcutaneous q1800   Continuous Infusions:  ceFEPime (MAXIPIME) IV 2 g (01/27/23 0552)    Time  30  Rhetta Mura, MD  [facility]

## 2023-01-27 NOTE — Plan of Care (Signed)
  Problem: Fluid Volume: Goal: Hemodynamic stability will improve Outcome: Progressing   Problem: Respiratory: Goal: Ability to maintain adequate ventilation will improve Outcome: Progressing   Problem: Education: Goal: Knowledge of General Education information will improve Description: Including pain rating scale, medication(s)/side effects and non-pharmacologic comfort measures Outcome: Progressing   Problem: Clinical Measurements: Goal: Ability to maintain clinical measurements within normal limits will improve Outcome: Progressing Goal: Respiratory complications will improve Outcome: Progressing Goal: Cardiovascular complication will be avoided Outcome: Progressing

## 2023-01-28 ENCOUNTER — Other Ambulatory Visit (HOSPITAL_COMMUNITY): Payer: Self-pay

## 2023-01-28 DIAGNOSIS — D709 Neutropenia, unspecified: Secondary | ICD-10-CM | POA: Diagnosis not present

## 2023-01-28 DIAGNOSIS — R5081 Fever presenting with conditions classified elsewhere: Secondary | ICD-10-CM | POA: Diagnosis not present

## 2023-01-28 LAB — CBC WITH DIFFERENTIAL/PLATELET
Abs Immature Granulocytes: 0.03 10*3/uL (ref 0.00–0.07)
Basophils Absolute: 0.1 10*3/uL (ref 0.0–0.1)
Basophils Relative: 1 %
Eosinophils Absolute: 0 10*3/uL (ref 0.0–0.5)
Eosinophils Relative: 1 %
HCT: 28.8 % — ABNORMAL LOW (ref 36.0–46.0)
Hemoglobin: 9.8 g/dL — ABNORMAL LOW (ref 12.0–15.0)
Immature Granulocytes: 1 %
Lymphocytes Relative: 19 %
Lymphs Abs: 1.2 10*3/uL (ref 0.7–4.0)
MCH: 35 pg — ABNORMAL HIGH (ref 26.0–34.0)
MCHC: 34 g/dL (ref 30.0–36.0)
MCV: 102.9 fL — ABNORMAL HIGH (ref 80.0–100.0)
Monocytes Absolute: 0.3 10*3/uL (ref 0.1–1.0)
Monocytes Relative: 5 %
Neutro Abs: 4.7 10*3/uL (ref 1.7–7.7)
Neutrophils Relative %: 73 %
Platelets: 107 10*3/uL — ABNORMAL LOW (ref 150–400)
RBC: 2.8 MIL/uL — ABNORMAL LOW (ref 3.87–5.11)
RDW: 16.6 % — ABNORMAL HIGH (ref 11.5–15.5)
WBC: 6.4 10*3/uL (ref 4.0–10.5)
nRBC: 0 % (ref 0.0–0.2)

## 2023-01-28 LAB — BASIC METABOLIC PANEL
Anion gap: 6 (ref 5–15)
BUN: 18 mg/dL (ref 6–20)
CO2: 23 mmol/L (ref 22–32)
Calcium: 8.8 mg/dL — ABNORMAL LOW (ref 8.9–10.3)
Chloride: 109 mmol/L (ref 98–111)
Creatinine, Ser: 0.5 mg/dL (ref 0.44–1.00)
GFR, Estimated: >60 mL/min (ref >60)
Glucose, Bld: 86 mg/dL (ref 70–99)
Potassium: 3.7 mmol/L (ref 3.5–5.1)
Sodium: 138 mmol/L (ref 135–145)

## 2023-01-28 LAB — GASTROINTESTINAL PANEL BY PCR, STOOL (REPLACES STOOL CULTURE)

## 2023-01-28 LAB — LACTOFERRIN, FECAL, QUALITATIVE: Lactoferrin, Fecal, Qual: NEGATIVE

## 2023-01-28 LAB — GLUCOSE, CAPILLARY
Glucose-Capillary: 82 mg/dL (ref 70–99)
Glucose-Capillary: 77 mg/dL (ref 70–99)

## 2023-01-28 MED ORDER — HEPARIN SOD (PORK) LOCK FLUSH 100 UNIT/ML IV SOLN
500.0000 [IU] | Freq: Once | INTRAVENOUS | Status: AC
  Administered 2023-01-28: 500 [IU] via INTRAVENOUS
  Filled 2023-01-28: qty 5

## 2023-01-28 NOTE — Discharge Summary (Addendum)
Physician Discharge Summary   Patient: Gabriela Sullivan MRN: [id number] DOB: 1990-07-29  Admit date:     01/24/2023  Discharge date: 01/28/23  Discharge Physician: Jonah Blue   PCP: Leane Call, MD   Recommendations at discharge:   Stop antibiotics Follow up at oncology clinic tomorrow as scheduled for injection Hold Mounjaro until f/u visit F/u with PCP in 1-2 weeks, will need repeat BMP  Discharge Diagnoses: Principal Problem:   Febrile neutropenia (HCC) Active Problems:   Malignant neoplasm of upper-outer quadrant of left breast in female, estrogen receptor positive (HCC)   Lytic bone lesions on xray   Nonalcoholic steatohepatitis   Type 2 diabetes mellitus (HCC)   Pancytopenia (HCC)   Hypokalemia   Hypocalcemia    Hospital Course: 31yo with h/o asthma, migraines, prediabetes, and 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 She is currently on daily anastrozole and abemaciclib, last treatment 2 weeks ago. Sepsis associated with neutropenia without an obvious source, started on Cefepime. Concern for infectious diarrhea, fecal lactoferrin, C diff and GI pathogen panels ordered.   Assessment and Plan:  Febrile neutropenia Patient presented with reported fever to 104 at home  Last chemo treatment was reported to be 2 weeks ago No documented fever while hospitalized, no evidence of sepsis Her only symptoms are GI in nature with negative GI evaluation, only with mild ongoing diarrhea No obvious UTI on 4 UAs this month, urine culture contaminated CXR negative Has received Cefepime since presentation Feels well Stop abx, discharge to home Discussed with Dr. Pamelia Hoit, who agrees; has oncology appt tomorrow already scheduled  Metastatic breast cancer H/o inflammatory breast cancer, no obvious worsening of lytic bone lesions Continue abemaciclib and anastrazole F/u with oncology tomorrow as  scheduled  Pancytopenia (HCC) In the setting of ongoing cancer treatment Neutropenia resolved today, likely related to Granix injection on 8/26 Hgb stable Platelets stable Will defer additional Granix injections to oncology, Dr. Pamelia Hoit   Nonalcoholic steatohepatitis Seems to have resolved on imaging. LFTs normal on 8/25  Hypokalemia Stable as inpatient Continue Klor-Con for now Recheck BMP at oncology/PCP appointments   Type 2 diabetes mellitus (HCC) A1c was 4.1 on 8/9 - ? No longer diabetic GI symptoms could be related to [facility] - would hold until PCP follow up and discuss further   Obesity Body mass index is 34.18 kg/m.Marland Kitchen  Weight loss should be encouraged Outpatient PCP/bariatric medicine/bariatric surgery f/u encouraged  As above, hold Mounjaro for now   Consultants: None   Procedures: None   Antibiotics: Cefepime 8/24-present   30 Day Unplanned Readmission Risk Score     Flowsheet Row ED to Hosp-Admission (Current) from 01/24/2023 in [facility]  30 Day Unplanned Readmission Risk Score (%) 18.99 Filed at 01/28/2023 0801           This score is the patient's risk of an unplanned readmission within 30 days of being discharged (0 -100%). The score is based on dignosis, age, lab data, medications, orders, and past utilization.   Low:  0-14.9   Medium: 15-21.9   High: 22-29.9   Extreme: 30 and above           Disposition: Home Diet recommendation:  Carb modified diet DISCHARGE MEDICATION: Allergies as of 01/28/2023   No Known Allergies      Medication List     STOP taking these medications    Mounjaro 5 MG/0.5ML Pen Generic drug: tirzepatide  TAKE these medications    anastrozole 1 MG tablet Commonly known as: ARIMIDEX Take 1 tablet (1 mg total) by mouth daily.   calcium carbonate 500 MG chewable tablet Commonly known as: TUMS - dosed in mg elemental calcium Chew 2 tablets by mouth every other day.   ondansetron 4  MG disintegrating tablet Commonly known as: ZOFRAN-ODT Take 1 tablet (4 mg total) by mouth every 8 (eight) hours as needed for nausea or vomiting.   potassium chloride 10 MEQ tablet Commonly known as: KLOR-CON Take 1 tablet (10 mEq total) by mouth daily.   Verzenio 100 MG tablet Generic drug: abemaciclib Take 1 tablet (100 mg total) by mouth 2 (two) times daily. Swallow tablets whole. Do not chew, crush, or split tablets before swallowing.   Vitamin D (Ergocalciferol) 1.25 MG (50000 UNIT) Caps capsule Commonly known as: DRISDOL Take 50,000 Units by mouth once a week.        Discharge Exam: Filed Weights   01/24/23 2317  Weight: 79.4 kg   Subjective: Feeling well, would like to go home.  Has onc f/u appt tomorrow.     Objective:     Vitals:    01/27/23 2051 01/28/23 0547  BP: (!) 112/56 116/64  Pulse: 79 76  Resp: 18 18  Temp: 98.3 F (36.8 C) 98.2 F (36.8 C)  SpO2: 100% 99%      Intake/Output Summary (Last 24 hours) at 01/28/2023 0903 Last data filed at 01/28/2023 0351    Gross per 24 hour  Intake 660 ml  Output --  Net 660 ml       Filed Weights    01/24/23 2317  Weight: 79.4 kg      Exam:   General:  Appears calm and comfortable and is in NAD Eyes:   EOMI, normal lids, iris ENT:  grossly normal hearing, lips & tongue, mmm Neck:  no LAD, masses or thyromegaly Cardiovascular:  RRR, no m/r/g. No LE edema.  Respiratory:   CTA bilaterally with no wheezes/rales/rhonchi.  Normal respiratory effort. Abdomen:  soft, NT, ND Skin:  no rash or induration seen on limited exam Musculoskeletal:  grossly normal tone BUE/BLE, good ROM, no bony abnormality Psychiatric:  grossly normal mood and affect, speech fluent and appropriate, AOx3 Neurologic:  CN 2-12 grossly intact, moves all extremities in coordinated fashion   Data Reviewed: I have reviewed the patient's lab results since admission.  Pertinent labs for today include:   C diff negative Lactoferrin  negative GI pathogen panel negative  Condition at discharge: stable  The results of significant diagnostics from this hospitalization (including imaging, microbiology, ancillary and laboratory) are listed below for reference.   Imaging Studies: DG Chest Port 1 View  Result Date: 01/25/2023 CLINICAL DATA:  [id number] with sepsis. Undergoing chemotherapy for cancer. EXAM: PORTABLE CHEST 1 VIEW COMPARISON:  CTA chest 01/10/2023 FINDINGS: The heart size and mediastinal contours are within normal limits. Both lungs are clear. The visualized skeletal structures are unremarkable. There are left axillary surgical clips. There is a right chest port with IJ approach catheter, the catheter tip at the superior cavoatrial junction. Compare: Stable chest. IMPRESSION: No active disease. Right chest port unchanged. Electronically Signed   By: Almira Bar M.D.   On: 01/25/2023 00:36   CT Angio Chest PE W/Cm &/Or Wo Cm  Result Date: 01/10/2023 CLINICAL DATA:  Right breast cancer, status post bilateral mastectomy. Fever and flank pain. Evaluate for PE. EXAM: CT ANGIOGRAPHY CHEST WITH CONTRAST TECHNIQUE: Multidetector CT imaging  of the chest was performed using the standard protocol during bolus administration of intravenous contrast. Multiplanar CT image reconstructions and MIPs were obtained to evaluate the vascular anatomy. RADIATION DOSE REDUCTION: This exam was performed according to the departmental dose-optimization program which includes automated exposure control, adjustment of the mA and/or kV according to patient size and/or use of iterative reconstruction technique. CONTRAST:  75mL OMNIPAQUE IOHEXOL 350 MG/ML SOLN COMPARISON:  CT chest dated 12/31/2022. FINDINGS: Cardiovascular: Satisfactory opacification of the bilateral pulmonary arteries to the lobar level. No evidence of pulmonary embolism. Although not tailored for evaluation of the thoracic aorta, there is no evidence of thoracic aortic aneurysm or  dissection. Heart is normal in size.  No pericardial effusion. Right chest port terminates in the upper right atrium. Mediastinum/Nodes: No suspicious mediastinal lymphadenopathy. Status post left axillary lymph node dissection with residual 7 mm short axis left axillary node (series 4/image 44), suspicious. Lungs/Pleura: Radiation changes in the anterior left upper lobe. No suspicious pulmonary nodules. No focal consolidation. No pleural effusion or pneumothorax. Upper Abdomen: Visualized upper abdomen is grossly unremarkable. Musculoskeletal: Status post bilateral mastectomy. Visualized osseous structures are within normal limits. Review of the MIP images confirms the above findings. IMPRESSION: No evidence of pulmonary embolism. Status post bilateral mastectomy with left axillary lymph node dissection. Radiation changes in the anterior left upper lobe. Residual 7 mm short axis left axillary node, unchanged from recent CT, suspicious for nodal metastasis. Electronically Signed   By: Charline Bills M.D.   On: 01/10/2023 03:35   DG Chest Port 1 View  Result Date: 01/09/2023 CLINICAL DATA:  Questionable sepsis EXAM: PORTABLE CHEST 1 VIEW COMPARISON:  Chest x-ray 620 1,002 FINDINGS: Right chest port catheter tip ends in the SVC. The heart size and mediastinal contours are within normal limits. Both lungs are clear. The visualized skeletal structures are unremarkable. IMPRESSION: No active disease. Electronically Signed   By: Darliss Cheney M.D.   On: 01/09/2023 21:59   CT ABDOMEN PELVIS W CONTRAST  Result Date: 01/05/2023 CLINICAL DATA:  Abdominal pain, nausea, vomiting EXAM: CT ABDOMEN AND PELVIS WITH CONTRAST TECHNIQUE: Multidetector CT imaging of the abdomen and pelvis was performed using the standard protocol following bolus administration of intravenous contrast. RADIATION DOSE REDUCTION: This exam was performed according to the departmental dose-optimization program which includes automated exposure  control, adjustment of the mA and/or kV according to patient size and/or use of iterative reconstruction technique. CONTRAST:  OMNIPAQUE IOHEXOL 300 MG/ML  SOLN COMPARISON:  12/31/2022 FINDINGS: Lower chest: No acute abnormality Hepatobiliary: No focal hepatic abnormality. Gallbladder unremarkable. Pancreas: No focal abnormality or ductal dilatation. Spleen: No focal abnormality.  Normal size. Adrenals/Urinary Tract: No adrenal abnormality. No focal renal abnormality. No stones or hydronephrosis. Urinary bladder is unremarkable. Stomach/Bowel: Stomach, large and small bowel grossly unremarkable. Normal appendix. Vascular/Lymphatic: No evidence of aneurysm or adenopathy. Reproductive: Uterus and adnexa unremarkable.  No mass. Other: No free fluid or free air. Musculoskeletal: Scattered lucent lesions within T11, L2 and L5 vertebral bodies as well as the right pelvis and left iliac bone. Mixed lytic and sclerotic sacral lesion. These are stable since prior study. IMPRESSION: No acute findings in the abdomen or pelvis. Stable osseous metastatic disease. Electronically Signed   By: Charlett Nose M.D.   On: 01/05/2023 21:48   CT Head Wo Contrast  Result Date: 01/05/2023 CLINICAL DATA:  Sudden severe headache. Nausea and vomiting for 1 week. EXAM: CT HEAD WITHOUT CONTRAST TECHNIQUE: Contiguous axial images were obtained from the  base of the skull through the vertex without intravenous contrast. RADIATION DOSE REDUCTION: This exam was performed according to the departmental dose-optimization program which includes automated exposure control, adjustment of the mA and/or kV according to patient size and/or use of iterative reconstruction technique. COMPARISON:  CT brain 08/04/2021 FINDINGS: Brain: The ventricles are normal in size and configuration. The basilar cisterns are patent. No mass, mass effect, or midline shift. No acute intracranial hemorrhage is seen. No abnormal extra-axial fluid collection. Preservation  of the normal cortical gray-white interface without CT evidence of an acute major vascular territorial cortical based infarction. Vascular: No hyperdense vessel or unexpected calcification. Skull: Normal. Negative for fracture or focal lesion. Sinuses/Orbits: The visualized orbits are unremarkable. There is a mild-to-moderate inferior right maxillary sinus mucosal polyp. This is grossly similar to prior on the available comparison study in which less of the right maxillary sinuses imaged. Otherwise, the visualized paranasal sinuses and mastoid air cells are clear. No acute calvarial fracture is seen. Other: None. IMPRESSION: 1. No acute intracranial process is seen. 2. Mild-to-moderate inferior right maxillary sinus mucosal polyp. Electronically Signed   By: Neita Garnet M.D.   On: 01/05/2023 20:28   CT CHEST ABDOMEN PELVIS W CONTRAST  Result Date: 12/31/2022 CLINICAL DATA:  Metastatic breast cancer, follow-up. * Tracking Code: BO *. EXAM: CT CHEST, ABDOMEN, AND PELVIS WITH CONTRAST TECHNIQUE: Multidetector CT imaging of the chest, abdomen and pelvis was performed following the standard protocol during bolus administration of intravenous contrast. RADIATION DOSE REDUCTION: This exam was performed according to the departmental dose-optimization program which includes automated exposure control, adjustment of the mA and/or kV according to patient size and/or use of iterative reconstruction technique. CONTRAST:  OMNIPAQUE IOHEXOL 300 MG/ML  SOLN COMPARISON:  Multiple priors including CT July 01, 2022. FINDINGS: CT CHEST FINDINGS Cardiovascular: Accessed right chest Port-A-Cath with tip at the superior cavoatrial junction. Normal caliber abdominal aorta. No central pulmonary embolus on this nondedicated study. Normal size heart. No significant pericardial effusion/thickening. Mediastinum/Nodes: No suspicious thyroid nodule. Stable 8 mm short axis left axillary lymph node on image 27/2. Prior left axillary  lymph node dissection No pathologically enlarged mediastinal or hilar lymph nodes. Lungs/Pleura: Postradiation change in the anterior left upper lobe. No suspicious pulmonary nodules or masses. No pleural effusion. No pneumothorax Musculoskeletal: Similar appearance of the osseous metastasis including: Stable subtle lucent sternal lesion. Stable lucency in the lateral left T11 vertebral body stable sclerotic focus in the anterior T10 vertebral body. No new suspicious lytic or blastic lesion of bone identified. CT ABDOMEN PELVIS FINDINGS Hepatobiliary: No suspicious hepatic lesion gallbladder is unremarkable. No biliary ductal dilation Pancreas: No pancreatic ductal dilation or evidence of acute inflammation. Spleen: No splenomegaly. Adrenals/Urinary Tract: Bilateral adrenal glands appear normal. No hydronephrosis. Kidneys demonstrate symmetric enhancement Stomach/Bowel: Stomach is unremarkable for degree of distension. No pathologic dilation of small or large bowel. No evidence of acute bowel inflammation. Vascular/Lymphatic: Normal caliber abdominal aorta. Smooth IVC contours. The portal, splenic and superior mesenteric veins are patent. No pathologically enlarged abdominal or pelvic lymph nodes. Reproductive: Uterus and bilateral adnexa are unremarkable. Other: No significant abdominopelvic free fluid Musculoskeletal: No significant interval change in the osseous metastasis including: Lucent lesions at L2 and L5 within the right ischium and right ilium. Additional lucent and sclerotic lesions in the right hemi sacrum and left iliac bone posteriorly no new aggressive lytic or blastic lesion of bone IMPRESSION: 1. No significant interval change in the osseous metastasis. 2. No evidence of new  or progressive metastatic disease in the chest, abdomen or pelvis. 3. Stable 8 mm short axis left axillary lymph node. Electronically Signed   By: Maudry Mayhew M.D.   On: 12/31/2022 10:31    Microbiology: Results for  orders placed or performed during the hospital encounter of 01/24/23  Culture, blood (Routine x 2)     Status: None (Preliminary result)   Collection Time: 01/25/23 12:14 AM   Specimen: Right Antecubital; Blood  Result Value Ref Range Status   Specimen Description   Final    RIGHT ANTECUBITAL Performed at [facility], [address], [place], [place] [address]    Special Requests   Final    BOTTLES DRAWN AEROBIC AND ANAEROBIC Blood Culture adequate volume Performed at [facility], [address], [place], [place] [address]    Culture   Final    NO GROWTH 2 DAYS Performed at [facility], [address] [place], [place] [address]    Report Status PENDING  Incomplete  SARS Coronavirus 2 by RT PCR (hospital order, performed in [facility] hospital lab) *cepheid single result test* Anterior Nasal Swab     Status: None   Collection Time: 01/25/23  2:07 AM   Specimen: Anterior Nasal Swab  Result Value Ref Range Status   SARS Coronavirus 2 by RT PCR NEGATIVE NEGATIVE Final    Comment: (NOTE) SARS-CoV-2 target nucleic acids are NOT DETECTED.  The SARS-CoV-2 RNA is generally detectable in upper and lower respiratory specimens during the acute phase of infection. The lowest concentration of SARS-CoV-2 viral copies this assay can detect is 250 copies / mL. A negative result does not preclude SARS-CoV-2 infection and should not be used as the sole basis for treatment or other patient management decisions.  A negative result may occur with improper specimen collection / handling, submission of specimen other than nasopharyngeal swab, presence of viral mutation(s) within the areas targeted by this assay, and inadequate number of viral copies (<250 copies / mL). A negative result must be combined with clinical observations, patient history, and epidemiological information.  Fact Sheet for Patients:    RoadLapTop.co.za  Fact Sheet for Healthcare Providers: http://kim-miller.com/  This test is not yet approved or  cleared by the [place] FDA and has been authorized for detection and/or diagnosis of SARS-CoV-2 by FDA under an Emergency Use Authorization (EUA).  This EUA will remain in effect (meaning this test can be used) for the duration of the COVID-19 declaration under Section 564(b)(1) of the Act, 21 U.S.C. section 360bbb-3(b)(1), unless the authorization is terminated or revoked sooner.  Performed at [facility], [address] [place], [place] [address]   Urine Culture     Status: Abnormal   Collection Time: 01/25/23  2:24 AM   Specimen: Urine, Random  Result Value Ref Range Status   Specimen Description   Final    URINE, RANDOM Performed at [facility], [address] [place], [place] [address]    Special Requests   Final    NONE Reflexed from [phone] Performed at [facility], [address] [place], [place] [address]    Culture MULTIPLE SPECIES PRESENT, SUGGEST RECOLLECTION (A)  Final   Report Status 01/26/2023 FINAL  Final  C Difficile Quick Screen w PCR reflex     Status: None   Collection Time: 01/27/23 10:33 AM   Specimen: STOOL  Result Value Ref Range Status   C Diff antigen  NEGATIVE NEGATIVE Final   C Diff toxin NEGATIVE NEGATIVE Final   C Diff interpretation No C. difficile detected.  Final    Comment: Performed at [facility], [address], [place], [place] [address]  Gastrointestinal Panel by PCR , Stool     Status: None   Collection Time: 01/27/23 10:33 AM   Specimen: Stool  Result Value Ref Range Status   Campylobacter species NOT DETECTED NOT DETECTED Final   Plesimonas shigelloides NOT DETECTED NOT DETECTED Final   Salmonella species NOT DETECTED NOT DETECTED Final   Yersinia enterocolitica NOT DETECTED NOT DETECTED  Final   Vibrio species NOT DETECTED NOT DETECTED Final   Vibrio cholerae NOT DETECTED NOT DETECTED Final   Enteroaggregative E coli (EAEC) NOT DETECTED NOT DETECTED Final   Enteropathogenic E coli (EPEC) NOT DETECTED NOT DETECTED Final   Enterotoxigenic E coli (ETEC) NOT DETECTED NOT DETECTED Final   Shiga like toxin producing E coli (STEC) NOT DETECTED NOT DETECTED Final   Shigella/Enteroinvasive E coli (EIEC) NOT DETECTED NOT DETECTED Final   Cryptosporidium NOT DETECTED NOT DETECTED Final   Cyclospora cayetanensis NOT DETECTED NOT DETECTED Final   Entamoeba histolytica NOT DETECTED NOT DETECTED Final   Giardia lamblia NOT DETECTED NOT DETECTED Final   Adenovirus F40/41 NOT DETECTED NOT DETECTED Final   Astrovirus NOT DETECTED NOT DETECTED Final   Norovirus GI/GII NOT DETECTED NOT DETECTED Final   Rotavirus A NOT DETECTED NOT DETECTED Final   Sapovirus (I, II, IV, and V) NOT DETECTED NOT DETECTED Final    Comment: Performed at [facility], [address] [place], [place] [address]    Labs: CBC: Recent Labs  Lab 01/25/23 0019 01/26/23 0500 01/26/23 0758 01/27/23 0553 01/28/23 1040  WBC 2.5* 1.9* 1.9* 1.8* 6.4  NEUTROABS 1.5*  --  0.7* 0.6* 4.7  HGB 8.9* 8.8* 10.2* 9.2* 9.8*  HCT 25.4* 25.6* 29.3* 26.4* 28.8*  MCV 102.4* 104.1* 103.2* 103.1* 102.9*  PLT 98* 95* 116* 105* 107*   Basic Metabolic Panel: Recent Labs  Lab 01/25/23 0019 01/26/23 0500 01/27/23 0553 01/28/23 1040  NA 137 139 138 138  K 3.0* 3.8 3.8 3.7  CL 109 110 109 109  CO2 24 26 26 23   GLUCOSE 92 104* 100* 86  BUN 14 11 13 18   CREATININE 0.76 0.69 0.70 0.50  CALCIUM 8.0* 8.1* 8.3* 8.8*  MG 2.0  --   --   --   PHOS 3.8  --   --   --    Liver Function Tests: Recent Labs  Lab 01/25/23 0019 01/26/23 0500  AST 31 29  ALT 29 25  ALKPHOS 51 45  BILITOT 0.6 0.6  PROT 6.9 6.3*  ALBUMIN 3.4* 3.0*   CBG: Recent Labs  Lab 01/27/23 1220 01/27/23 1637 01/27/23 2052 01/28/23 0737  01/28/23 1133  GLUCAP 86 101* 101* 82 77    Discharge time spent: greater than 30 minutes.  Signed: Jonah Blue, MD [facility] 01/28/2023

## 2023-01-28 NOTE — Progress Notes (Signed)
   01/27/23 1607  TOC Brief Assessment  Insurance and Status Reviewed  Patient has primary care physician Yes  Home environment has been reviewed yes  Prior level of function: independent  Prior/Current Home Services No current home services  Social Determinants of Health Reivew SDOH reviewed no interventions necessary  Readmission risk has been reviewed Yes  Transition of care needs no transition of care needs at this time

## 2023-01-28 NOTE — Progress Notes (Signed)
   01/28/23 1608  TOC Discharge Assessment  Final next level of care Home/Self Care  Once discharged, how will the patient get to their discharge location? Family/Friend - Partnered Transport  Has discharge transport plan been identified? Yes  Barriers to Discharge No Barriers Identified  Choice offered to / list presented to  NA  DME Agency NA  HH Arranged NA  Patient and family notified of of transfer 01/28/23

## 2023-01-29 ENCOUNTER — Inpatient Hospital Stay: Payer: Medicaid Other

## 2023-01-29 ENCOUNTER — Ambulatory Visit: Payer: Medicaid Other

## 2023-01-29 ENCOUNTER — Inpatient Hospital Stay: Payer: Medicaid Other | Admitting: Hematology and Oncology

## 2023-01-29 VITALS — BP 117/72 | HR 88 | Temp 98.4°F | Resp 18

## 2023-01-29 DIAGNOSIS — C7800 Secondary malignant neoplasm of unspecified lung: Secondary | ICD-10-CM | POA: Diagnosis not present

## 2023-01-29 DIAGNOSIS — Z95828 Presence of other vascular implants and grafts: Secondary | ICD-10-CM

## 2023-01-29 DIAGNOSIS — Z9013 Acquired absence of bilateral breasts and nipples: Secondary | ICD-10-CM | POA: Diagnosis not present

## 2023-01-29 DIAGNOSIS — C7951 Secondary malignant neoplasm of bone: Secondary | ICD-10-CM | POA: Diagnosis not present

## 2023-01-29 DIAGNOSIS — C50412 Malignant neoplasm of upper-outer quadrant of left female breast: Secondary | ICD-10-CM | POA: Diagnosis not present

## 2023-01-29 DIAGNOSIS — Z170 Estrogen receptor positive status [ER+]: Secondary | ICD-10-CM

## 2023-01-29 DIAGNOSIS — Z79811 Long term (current) use of aromatase inhibitors: Secondary | ICD-10-CM | POA: Diagnosis not present

## 2023-01-29 DIAGNOSIS — Z5111 Encounter for antineoplastic chemotherapy: Secondary | ICD-10-CM | POA: Diagnosis present

## 2023-01-29 MED ORDER — GOSERELIN ACETATE 3.6 MG ~~LOC~~ IMPL
3.6000 mg | DRUG_IMPLANT | Freq: Once | SUBCUTANEOUS | Status: AC
  Administered 2023-01-29: 3.6 mg via SUBCUTANEOUS
  Filled 2023-01-29: qty 3.6

## 2023-01-30 ENCOUNTER — Inpatient Hospital Stay: Payer: Medicaid Other

## 2023-01-30 ENCOUNTER — Encounter: Payer: Self-pay | Admitting: Physical Therapy

## 2023-01-30 ENCOUNTER — Ambulatory Visit: Payer: Medicaid Other | Admitting: Physical Therapy

## 2023-01-30 DIAGNOSIS — M62830 Muscle spasm of back: Secondary | ICD-10-CM | POA: Diagnosis present

## 2023-01-30 DIAGNOSIS — R293 Abnormal posture: Secondary | ICD-10-CM

## 2023-01-30 DIAGNOSIS — M549 Dorsalgia, unspecified: Secondary | ICD-10-CM

## 2023-01-30 DIAGNOSIS — Z170 Estrogen receptor positive status [ER+]: Secondary | ICD-10-CM | POA: Diagnosis present

## 2023-01-30 DIAGNOSIS — C50412 Malignant neoplasm of upper-outer quadrant of left female breast: Secondary | ICD-10-CM | POA: Diagnosis present

## 2023-01-30 DIAGNOSIS — Z483 Aftercare following surgery for neoplasm: Secondary | ICD-10-CM | POA: Diagnosis present

## 2023-01-30 LAB — CULTURE, BLOOD (ROUTINE X 2)
Culture: NO GROWTH
Special Requests: ADEQUATE

## 2023-01-30 NOTE — Therapy (Signed)
OUTPATIENT PHYSICAL THERAPY ONCOLOGY TREATMENT  Patient Name: Gabriela Sullivan MRN: [id number] DOB:1990-08-15, 32 y.o., female Today's Date: 01/30/2023  END OF SESSION:  PT End of Session - 01/30/23 1408     Visit Number 2    Number of Visits 9    Date for PT Re-Evaluation 02/18/23    PT Start Time 1407    PT Stop Time 1453    PT Time Calculation (min) 46 min    Activity Tolerance Patient tolerated treatment well    Behavior During Therapy [facility] for tasks assessed/performed             Past Medical History:  Diagnosis Date   Asthma    remote   Breast cancer (HCC)    Elevated liver enzymes    Family history of breast cancer    Family history of ovarian cancer    Family history of prostate cancer    Family history of stomach cancer    Hepatic steatosis    Hyperglycemia    A1c 7.6% 12/19/20   Migraines    PCOS (polycystic ovarian syndrome)    Pre-diabetes    Past Surgical History:  Procedure Laterality Date   MASTECTOMY W/ SENTINEL NODE BIOPSY Right 01/31/2021   Procedure: RIGHT SIMPLE MASTECTOMY;  Surgeon: Harriette Bouillon, MD;  Location: [facility] OR;  Service: General;  Laterality: Right;   MODIFIED MASTECTOMY Left 01/31/2021   Procedure: LEFT MODIFIED RADICAL MASTECTOMY;  Surgeon: Harriette Bouillon, MD;  Location: [facility] OR;  Service: General;  Laterality: Left;   NO PAST SURGERIES     PORTACATH PLACEMENT Right 10/18/2020   Procedure: INSERTION PORT-A-CATH;  Surgeon: Harriette Bouillon, MD;  Location: [facility];  Service: General;  Laterality: Right;   Patient Active Problem List   Diagnosis Date Noted   Sepsis (HCC) 01/10/2023   Pancytopenia (HCC) 01/10/2023   Febrile neutropenia (HCC) 01/10/2023   Hypokalemia 01/10/2023   Hyponatremia 01/10/2023   Hypocalcemia 01/10/2023   Hyperbilirubinemia 01/10/2023   Elevated lipase 01/10/2023   Nausea, vomiting and diarrhea 01/10/2023   Transaminitis 01/10/2023   Nonalcoholic steatohepatitis 06/21/2022   Type 2 diabetes  mellitus (HCC) 06/20/2022   History of therapeutic radiation 11/07/2021   Left breast cancer with T3 tumor, >5 cm in greatest dimension (HCC) 01/31/2021   Bone metastases 12/28/2020   Port-A-Cath in place 12/06/2020   Genetic testing 11/25/2020   Goals of care, counseling/discussion 10/25/2020   Lytic bone lesions on xray 10/16/2020   Family history of ovarian cancer    Family history of prostate cancer    Family history of breast cancer    Family history of stomach cancer    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   Malignant neoplasm of upper-outer quadrant of left breast in female, estrogen receptor positive (HCC) 09/29/2020   Elevated LFTs     PCP: Yvone Neu, MD  REFERRING PROVIDER: [name], MD   REFERRING DIAG: M54.9 (ICD-10-CM) - Back pain, unspecified back location, unspecified back pain laterality, unspecified chronicity   THERAPY DIAG:  Back pain, unspecified back location, unspecified back pain laterality, unspecified chronicity  Muscle spasm of back  Abnormal posture  Malignant neoplasm of upper-outer quadrant of left breast in female, estrogen receptor positive (HCC)  ONSET DATE: 12/31/22  Rationale for Evaluation and Treatment: Rehabilitation  SUBJECTIVE:  SUBJECTIVE STATEMENT: I was hospitalized for sepsis again. I rearranged my room today and my back started hurting.   PERTINENT HISTORY: Patient was diagnosed on 09/26/2020 with 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 It is ER/PR positive and HER2 negative with a Ki67 of 15%. She underwent neoadjuvant chemotherapy beginning 10/24/2020 for 2 rounds which was stopped due to elevated LFTs. She had a bilateral mastectomy with left axillary node dissection on 01/31/2021 with  15/15 nodes positive. She started Anastrozole on 12/06/2020. 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   PAIN:  Are you having pain? Yes NPRS scale: 5/10 Pain location: throughout spine Pain orientation: Left  PAIN TYPE: burning and sharp Pain description: constant but worsens intermittently Aggravating factors: unsure Relieving factors: nothing  PRECAUTIONS: Other: bony mets  WEIGHT BEARING RESTRICTIONS: No  FALLS:  Has patient fallen in last 6 months? 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  LEISURE: ROM PT exercises  HAND DOMINANCE: right   PRIOR LEVEL OF FUNCTION: Independent  PATIENT GOALS: to be ok with the pain   OBJECTIVE:  COGNITION: Overall cognitive status: Within functional limits for tasks assessed   PALPATION: Increased muscle tightness in L side of back including upper traps, levator, rhomboids and paraspinal muscles  OBSERVATIONS / OTHER ASSESSMENTS: increased pec tightness on L side  POSTURE: forward head, rounded shoulders  UPPER EXTREMITY AROM/PROM: WFL   LOWER EXTREMITY MMT: 5/5  LEFS: 44     TODAY'S TREATMENT:                                                                                                                                         DATE:  01/30/23: Supine over 1/2 foam roll: alternating flexion x 10, horizontal abduction x 10 (stopped after 3 reps to perform gentle rib mobs due to cramping - improved after this), scaption x 10 with therapist providing stretch medial and downwards to L rib cage to help improve stretch Snow angels x 3 reps with 30 sec holds keeping hand on mat and pt not able to raise far due to tightness Pelvic tilt x 10 reps with 3 sec holds with pt returning  therapist demo and providing pt with v/c and t/c Knee to chest stretch x 30 sec hold x 3 reps Pelvic tilts with knee bends x 10 reps each Pelvic tilts with small marches x 10 reps each Marjo Bicker pose with 30 sec holds then walking fingers to L and R side each with 30 sec holds with increased stretch on L   01/21/23: Instructed pt in gentle lower trunk rotation stretch with L UE outstretched to help open rib  cage and decrease spasms in this area - educated pt to keep core engaged and keep back flat to avoid stress at back Began instructed pt in pelvic tilts with max v/c and t/c - pt engages pecs when attempting pelvic tilts and has difficulty with this exercise - will continue to instruct at next session  PATIENT EDUCATION:  Education details: deep breathing to help open rib cage, gentle trunk rotation while keep back flat, pelvic tilts, importance of core strength to help decrease back pain Person educated: Patient Education method: Explanation, Demonstration, Tactile cues, and Verbal cues Education comprehension: verbalized understanding, returned demonstration, verbal cues required, tactile cues required, and needs further education  HOME EXERCISE PROGRAM: Deep breathing Lower trunk stretch with only minimal movement of lower extremities due to core weakness Access Code: [email or web address] URL: [email or web address] Date: 01/30/2023 Prepared by: Leonette Most  Exercises - Supine Double Knee to Chest  - 1 x daily - 7 x weekly - 1 sets - 3-5 reps - 30 sec hold - Supine Posterior Pelvic Tilt  - 1 x daily - 7 x weekly - 1 sets - 10 reps - 30 sec hold - Supine Posterior Pelvic Tilt with Knee Rocks  - 1 x daily - 7 x weekly - 1 sets - 10 reps - Supine March with Posterior Pelvic Tilt  - 1 x daily - 7 x weekly - 1 sets - 10 reps - Thoracic Foam Roll Mobilization Backstroke  - 1 x daily - 7 x weekly - 1 sets - 10 reps - Open Book Chest Rotation Stretch on Foam 1/2 Roll  - 1 x daily - 7  x weekly - 1 sets - 5 reps - 30 sec  hold - Thoracic Y on Foam Roll  - 1 x daily - 7 x weekly - 1 sets - 10 reps - Supine Static Chest Stretch on Foam Roll  - 1 x daily - 7 x weekly - 3 sets - 5 reps - 30 sec hold -Child's Pose with Sidebending  - 1 x daily - 7 x weekly - 1 sets - 3 reps - 30 sec hold  ASSESSMENT:  CLINICAL IMPRESSION: Pt returns to PT after recent hospitalization for another bout of sepsis. Continued to instruct pt in core strengthening exercises and stretches for thoracic spine, low back and ribs. Pt felt a good stretch with all of these and could tell her L side was more tight than her R. Issued a home exercise program with all exercises from today for pt to work on at home.   OBJECTIVE IMPAIRMENTS: decreased strength, increased fascial restrictions, increased muscle spasms, postural dysfunction, and pain.   ACTIVITY LIMITATIONS: carrying and lifting  PARTICIPATION LIMITATIONS: community activity and occupation  PERSONAL FACTORS: Fitness and Time since onset of injury/illness/exacerbation are also affecting patient's functional outcome.   REHAB POTENTIAL: Good  CLINICAL DECISION MAKING: Stable/uncomplicated  EVALUATION COMPLEXITY: Low  GOALS: Goals reviewed with patient? Yes  SHORT TERM GOALS=LONG TERM GOALS Target date: 02/18/23  Pt will be able to engage core muscles when doing her ADLs and at work to help decrease back pain.  Baseline: Goal status: INITIAL  2.  Pt will report a 75% improvement in back pain to allow improved comfort.  Baseline:  Goal status: INITIAL  3.  Pt will report she is no longer having pain in area of her ribs to allow improved comfort.  Baseline:  Goal status: INITIAL  4.  Pt will be independent in a home exercise  program for continued strengthening and stretching.  Baseline:  Goal status: INITIAL  PLAN:  PT FREQUENCY: 2x/week  PT DURATION: 4 weeks  PLANNED INTERVENTIONS: Therapeutic exercises, Therapeutic activity,  Patient/Family education, Self Care, Joint mobilization, Taping, Manual therapy, and Re-evaluation  PLAN FOR NEXT SESSION: instruct in core exercises - pelvic tilts and eventually progress, L pec stretches, rib opening stretches, how are cramps with new stretches?   [facility], PT 01/30/2023, 2:55 PM

## 2023-01-31 ENCOUNTER — Other Ambulatory Visit (HOSPITAL_COMMUNITY): Payer: Self-pay

## 2023-01-31 ENCOUNTER — Other Ambulatory Visit: Payer: Self-pay | Admitting: Hematology and Oncology

## 2023-02-04 ENCOUNTER — Other Ambulatory Visit: Payer: Self-pay | Admitting: Hematology and Oncology

## 2023-02-04 ENCOUNTER — Other Ambulatory Visit (HOSPITAL_COMMUNITY): Payer: Self-pay

## 2023-02-05 ENCOUNTER — Other Ambulatory Visit (HOSPITAL_COMMUNITY): Payer: Self-pay

## 2023-02-05 ENCOUNTER — Ambulatory Visit: Payer: Medicaid Other | Attending: Surgery

## 2023-02-05 DIAGNOSIS — Z170 Estrogen receptor positive status [ER+]: Secondary | ICD-10-CM | POA: Insufficient documentation

## 2023-02-05 DIAGNOSIS — C50412 Malignant neoplasm of upper-outer quadrant of left female breast: Secondary | ICD-10-CM | POA: Insufficient documentation

## 2023-02-05 DIAGNOSIS — M62830 Muscle spasm of back: Secondary | ICD-10-CM | POA: Diagnosis present

## 2023-02-05 DIAGNOSIS — R293 Abnormal posture: Secondary | ICD-10-CM | POA: Insufficient documentation

## 2023-02-05 DIAGNOSIS — M549 Dorsalgia, unspecified: Secondary | ICD-10-CM | POA: Diagnosis present

## 2023-02-05 NOTE — Therapy (Signed)
OUTPATIENT PHYSICAL THERAPY ONCOLOGY TREATMENT  Patient Name: Gabriela Sullivan MRN: [id number] DOB:[birth year], 32 y.o., female Today's Date: 02/05/2023  END OF SESSION:  PT End of Session - 02/05/23 1458     Visit Number 3    Number of Visits 9    Date for PT Re-Evaluation 02/18/23    PT Start Time 1500    PT Stop Time 1553    PT Time Calculation (min) 53 min    Activity Tolerance Patient tolerated treatment well    Behavior During Therapy WFL for tasks assessed/performed             Past Medical History:  Diagnosis Date   Asthma    remote   Breast cancer (HCC)    Elevated liver enzymes    Family history of breast cancer    Family history of ovarian cancer    Family history of prostate cancer    Family history of stomach cancer    Hepatic steatosis    Hyperglycemia    A1c 7.6% 12/19/20   Migraines    PCOS (polycystic ovarian syndrome)    Pre-diabetes    Past Surgical History:  Procedure Laterality Date   MASTECTOMY W/ SENTINEL NODE BIOPSY Right 01/31/2021   Procedure: RIGHT SIMPLE MASTECTOMY;  Surgeon: Harriette Bouillon, MD;  Location: [facility];  Service: General;  Laterality: Right;   MODIFIED MASTECTOMY Left 01/31/2021   Procedure: LEFT MODIFIED RADICAL MASTECTOMY;  Surgeon: Harriette Bouillon, MD;  Location: [facility];  Service: General;  Laterality: Left;   NO PAST SURGERIES     PORTACATH PLACEMENT Right 10/18/2020   Procedure: INSERTION PORT-A-CATH;  Surgeon: Harriette Bouillon, MD;  Location: [facility];  Service: General;  Laterality: Right;   Patient Active Problem List   Diagnosis Date Noted   Sepsis (HCC) 01/10/2023   Pancytopenia (HCC) 01/10/2023   Febrile neutropenia (HCC) 01/10/2023   Hypokalemia 01/10/2023   Hyponatremia 01/10/2023   Hypocalcemia 01/10/2023   Hyperbilirubinemia 01/10/2023   Elevated lipase 01/10/2023   Nausea, vomiting and diarrhea 01/10/2023   Transaminitis 01/10/2023   Nonalcoholic steatohepatitis 06/21/2022   Type 2 diabetes  mellitus (HCC) 06/20/2022   History of therapeutic radiation 11/07/2021   Left breast cancer with T3 tumor, >5 cm in greatest dimension (HCC) 01/31/2021   Bone metastases 12/28/2020   Port-A-Cath in place 12/06/2020   Genetic testing 11/25/2020   Goals of care, counseling/discussion 10/25/2020   Lytic bone lesions on xray 10/16/2020   Family history of ovarian cancer    Family history of prostate cancer    Family history of breast cancer    Family history of stomach cancer    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   Malignant neoplasm of upper-outer quadrant of left breast in female, estrogen receptor positive (HCC) 09/29/2020   Elevated LFTs     PCP: Yvone Neu, MD  REFERRING PROVIDER: [name], MD   REFERRING DIAG: M54.9 (ICD-10-CM) - Back pain, unspecified back location, unspecified back pain laterality, unspecified chronicity   THERAPY DIAG:  Back pain, unspecified back location, unspecified back pain laterality, unspecified chronicity  Muscle spasm of back  Abnormal posture  Malignant neoplasm of upper-outer quadrant of left breast in female, estrogen receptor positive (HCC)  ONSET DATE: 12/31/22  Rationale for Evaluation and Treatment: Rehabilitation  SUBJECTIVE:  SUBJECTIVE STATEMENT: I did pretty well after last visit. Today is a 7-8/10 because I had a lot going on this weekend getting ready for my birthday. I did a lot of lifting. The rib pain is worse when I do the rotation stretch. I can't grip very well with my right hand. I thnk I did something to it over the weekend.  PERTINENT HISTORY: Patient was diagnosed on 09/26/2020 with 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 It is ER/PR positive and HER2 negative with a Ki67 of  15%. She underwent neoadjuvant chemotherapy beginning 10/24/2020 for 2 rounds which was stopped due to elevated LFTs. She had a bilateral mastectomy with left axillary node dissection on 01/31/2021 with 15/15 nodes positive. She started Anastrozole on 12/06/2020. 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   PAIN:  Are you having pain? Yes NPRS scale:5- 7-8/10 Pain location: throughout spine Pain orientation: Left  PAIN TYPE: burning and sharp Pain description: constant but worsens intermittently Aggravating factors: unsure Relieving factors: nothing  PRECAUTIONS: Other: bony mets  WEIGHT BEARING RESTRICTIONS: No  FALLS:  Has patient fallen in last 6 months? 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  LEISURE: ROM PT exercises  HAND DOMINANCE: right   PRIOR LEVEL OF FUNCTION: Independent  PATIENT GOALS: to be ok with the pain   OBJECTIVE:  COGNITION: Overall cognitive status: Within functional limits for tasks assessed   PALPATION: Increased muscle tightness in L side of back including upper traps, levator, rhomboids and paraspinal muscles  OBSERVATIONS / OTHER ASSESSMENTS: increased pec tightness on L side  POSTURE: forward head, rounded shoulders  UPPER EXTREMITY AROM/PROM: WFL   LOWER EXTREMITY MMT: 5/5  LEFS: 44     TODAY'S TREATMENT:                                                                                                                                         DATE:  02/05/2023 Nu Step seat 5 UE 7, Lev 2 x 3 min and lev 4 x 2 min, 285 steps Meeks decompression x 3 ea with diaphragmatic breathing to start Left leg and arm lengthening in supine x 3  Posterior pelvic tilt; TC and verbal cues to  correct Posterior pelvic tilt with alternate arms 3-5 ea x 5 repetitions. Ppt with marching x 5 ea, repeated 4 times Supine ppt with left and then right shoulder abd x 10 ea, then  5 LTR with arms outstretched x 3 ea not forcing through pain and keeping LB mainly down with ppt. Showed pt how to perform supine to and from sit to keep stress off back Updated HEP with  Meeks decompression exs 01/30/23: Supine over 1/2 foam roll: alternating flexion x 10, horizontal abduction x 10 (stopped after 3 reps to perform gentle rib mobs due to cramping - improved after this), scaption x 10 with therapist providing stretch medial and downwards to L rib cage to help improve stretch Snow angels x 3 reps with 30 sec holds keeping hand on mat and pt not able to raise far due to tightness Pelvic tilt x 10 reps with 3 sec holds with pt returning therapist demo and providing pt with v/c and t/c Knee to chest stretch x 30 sec hold x 3 reps Pelvic tilts with knee bends x 10 reps each Pelvic tilts with small marches x 10 reps each Marjo Bicker pose with 30 sec holds then walking fingers to L and R side each with 30 sec holds with increased stretch on L   01/21/23: Instructed pt in gentle lower trunk rotation stretch with L UE outstretched to help open rib cage and decrease spasms in this area - educated pt to keep core engaged and keep back flat to avoid stress at back Began instructed pt in pelvic tilts with max v/c and t/c - pt engages pecs when attempting pelvic tilts and has difficulty with this exercise - will continue to instruct at next session  PATIENT EDUCATION:  Education details: deep breathing to help open rib cage, gentle trunk rotation while keep back flat, pelvic tilts, importance of core strength to help decrease back pain Person educated: Patient Education method: Explanation, Demonstration, Tactile cues, and Verbal cues Education comprehension: verbalized understanding, returned demonstration, verbal cues  required, tactile cues required, and needs further education  HOME EXERCISE PROGRAM: Deep breathing Lower trunk stretch with only minimal movement of lower extremities due to core weakness Access Code: [email or web address] URL: [email or web address] Date: 01/30/2023 Prepared by: Leonette Most  Exercises - Supine Double Knee to Chest  - 1 x daily - 7 x weekly - 1 sets - 3-5 reps - 30 sec hold - Supine Posterior Pelvic Tilt  - 1 x daily - 7 x weekly - 1 sets - 10 reps - 30 sec hold - Supine Posterior Pelvic Tilt with Knee Rocks  - 1 x daily - 7 x weekly - 1 sets - 10 reps - Supine March with Posterior Pelvic Tilt  - 1 x daily - 7 x weekly - 1 sets - 10 reps - Thoracic Foam Roll Mobilization Backstroke  - 1 x daily - 7 x weekly - 1 sets - 10 reps - Open Book Chest Rotation Stretch on Foam 1/2 Roll  - 1 x daily - 7 x weekly - 1 sets - 5 reps - 30 sec  hold - Thoracic Y on Foam Roll  - 1 x daily - 7 x weekly - 1 sets - 10 reps - Supine Static Chest Stretch on Foam Roll  - 1 x daily - 7 x weekly - 3 sets - 5 reps - 30 sec hold -Child's Pose with Sidebending  - 1 x daily - 7 x weekly - 1 sets - 3 reps - 30 sec hold  ASSESSMENT:  CLINICAL IMPRESSION: Initiated gentle Nu step with good response and Meeks decompression with good stretch for left side when adding left arm to left leg lengthening exs. Pt had difficulty initially performing ppt, but after practice did exceptionally well. She had no pain complaints with exercises and no spasm.  OBJECTIVE IMPAIRMENTS: decreased strength, increased fascial restrictions, increased muscle spasms, postural dysfunction, and pain.  ACTIVITY LIMITATIONS: carrying and lifting  PARTICIPATION LIMITATIONS: community activity and occupation  PERSONAL FACTORS: Fitness and Time since onset of injury/illness/exacerbation are also affecting patient's functional outcome.   REHAB POTENTIAL: Good  CLINICAL DECISION MAKING:  Stable/uncomplicated  EVALUATION COMPLEXITY: Low  GOALS: Goals reviewed with patient? Yes  SHORT TERM GOALS=LONG TERM GOALS Target date: 02/18/23  Pt will be able to engage core muscles when doing her ADLs and at work to help decrease back pain.  Baseline: Goal status: INITIAL  2.  Pt will report a 75% improvement in back pain to allow improved comfort.  Baseline:  Goal status: INITIAL  3.  Pt will report she is no longer having pain in area of her ribs to allow improved comfort.  Baseline:  Goal status: INITIAL  4.  Pt will be independent in a home exercise program for continued strengthening and stretching.  Baseline:  Goal status: INITIAL  PLAN:  PT FREQUENCY: 2x/week  PT DURATION: 4 weeks  PLANNED INTERVENTIONS: Therapeutic exercises, Therapeutic activity, Patient/Family education, Self Care, Joint mobilization, Taping, Manual therapy, and Re-evaluation  PLAN FOR NEXT SESSION: instruct in core exercises - pelvic tilts and eventually progress, L pec stretches, rib opening stretches, how are cramps with new stretches?   Waynette Buttery, PT 02/05/2023, 3:56 PM

## 2023-02-05 NOTE — Patient Instructions (Signed)
1. Decompression Exercise     Cancer Rehab [phone]    Lie on back on firm surface, knees bent, feet flat, arms turned up, out to sides, backs of hands down. Time _5-15__ minutes. Surface: floor   2. Shoulder Press    Start in Decompression Exercise position. Press shoulders downward towards supporting surface. Hold __2-3__ seconds while counting out loud. Repeat _3-5___ times. Do _1-2___ times per day.   3. Head Press    Bring cervical spine (neck) into neutral position (by either tucking the chin towards the chest or tilting the chin upward). Feel weight on back of head. Press head downward into supporting surface.    Hold _2-3__ seconds. Repeat _3-5__ times. Do _1-2__ times per day.   4. Leg Lengthener    Straighten one leg. Pull toes AND forefoot toward knee, extend heel. Lengthen leg by pulling pelvis away from ribs. Hold _2-3__ seconds. Relax. Repeat __4-6__ times. Do other leg.  Surface: floor   5. Leg Press    Straighten one leg down to floor keeping leg aligned with hip. Pull toes AND forefoot toward knee; extend heel.  Press entire leg downward (as if pressing leg into sandy beach). DO NOT BEND KNEE. Hold _2-3__ seconds. Do __4-6__ times. Repeat with other leg.

## 2023-02-06 ENCOUNTER — Ambulatory Visit: Payer: Medicaid Other

## 2023-02-06 ENCOUNTER — Other Ambulatory Visit: Payer: Self-pay

## 2023-02-06 ENCOUNTER — Other Ambulatory Visit (HOSPITAL_COMMUNITY): Payer: Self-pay

## 2023-02-06 DIAGNOSIS — M549 Dorsalgia, unspecified: Secondary | ICD-10-CM

## 2023-02-06 DIAGNOSIS — C50412 Malignant neoplasm of upper-outer quadrant of left female breast: Secondary | ICD-10-CM

## 2023-02-06 DIAGNOSIS — R293 Abnormal posture: Secondary | ICD-10-CM

## 2023-02-06 DIAGNOSIS — M62830 Muscle spasm of back: Secondary | ICD-10-CM

## 2023-02-06 MED ORDER — ABEMACICLIB 100 MG PO TABS
100.0000 mg | ORAL_TABLET | Freq: Two times a day (BID) | ORAL | 3 refills | Status: DC
  Filled 2023-02-06: qty 56, 28d supply, fill #0
  Filled 2023-02-27: qty 56, 28d supply, fill #1
  Filled 2023-03-20: qty 56, 28d supply, fill #2
  Filled 2023-04-23: qty 56, 28d supply, fill #3

## 2023-02-06 NOTE — Therapy (Signed)
OUTPATIENT PHYSICAL THERAPY ONCOLOGY TREATMENT  Patient Name: Gabriela Sullivan MRN: [id number] DOB:September 22, 1990, 32 y.o., female Today's Date: 02/06/2023  END OF SESSION:  PT End of Session - 02/06/23 0806     Visit Number 4    Number of Visits 9    Date for PT Re-Evaluation 02/18/23    PT Start Time 0802    PT Stop Time 0859    PT Time Calculation (min) 57 min    Activity Tolerance Patient tolerated treatment well    Behavior During Therapy [facility] for tasks assessed/performed             Past Medical History:  Diagnosis Date   Asthma    remote   Breast cancer (HCC)    Elevated liver enzymes    Family history of breast cancer    Family history of ovarian cancer    Family history of prostate cancer    Family history of stomach cancer    Hepatic steatosis    Hyperglycemia    A1c 7.6% 12/19/20   Migraines    PCOS (polycystic ovarian syndrome)    Pre-diabetes    Past Surgical History:  Procedure Laterality Date   MASTECTOMY W/ SENTINEL NODE BIOPSY Right 01/31/2021   Procedure: RIGHT SIMPLE MASTECTOMY;  Surgeon: Harriette Bouillon, MD;  Location: [facility] OR;  Service: General;  Laterality: Right;   MODIFIED MASTECTOMY Left 01/31/2021   Procedure: LEFT MODIFIED RADICAL MASTECTOMY;  Surgeon: Harriette Bouillon, MD;  Location: [facility] OR;  Service: General;  Laterality: Left;   NO PAST SURGERIES     PORTACATH PLACEMENT Right 10/18/2020   Procedure: INSERTION PORT-A-CATH;  Surgeon: Harriette Bouillon, MD;  Location: [facility];  Service: General;  Laterality: Right;   Patient Active Problem List   Diagnosis Date Noted   Sepsis (HCC) 01/10/2023   Pancytopenia (HCC) 01/10/2023   Febrile neutropenia (HCC) 01/10/2023   Hypokalemia 01/10/2023   Hyponatremia 01/10/2023   Hypocalcemia 01/10/2023   Hyperbilirubinemia 01/10/2023   Elevated lipase 01/10/2023   Nausea, vomiting and diarrhea 01/10/2023   Transaminitis 01/10/2023   Nonalcoholic steatohepatitis 06/21/2022   Type 2 diabetes  mellitus (HCC) 06/20/2022   History of therapeutic radiation 11/07/2021   Left breast cancer with T3 tumor, >5 cm in greatest dimension (HCC) 01/31/2021   Bone metastases 12/28/2020   Port-A-Cath in place 12/06/2020   Genetic testing 11/25/2020   Goals of care, counseling/discussion 10/25/2020   Lytic bone lesions on xray 10/16/2020   Family history of ovarian cancer    Family history of prostate cancer    Family history of breast cancer    Family history of stomach cancer    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   Malignant neoplasm of upper-outer quadrant of left breast in female, estrogen receptor positive (HCC) 09/29/2020   Elevated LFTs     PCP: Yvone Neu, MD  REFERRING PROVIDER: [name], MD   REFERRING DIAG: M54.9 (ICD-10-CM) - Back pain, unspecified back location, unspecified back pain laterality, unspecified chronicity   THERAPY DIAG:  Back pain, unspecified back location, unspecified back pain laterality, unspecified chronicity  Muscle spasm of back  Abnormal posture  Malignant neoplasm of upper-outer quadrant of left breast in female, estrogen receptor positive (HCC)  ONSET DATE: 12/31/22  Rationale for Evaluation and Treatment: Rehabilitation  SUBJECTIVE:  SUBJECTIVE STATEMENT: I feel really good since therapy yesterday. I am feeling much better overall since starting physical therapy.   PERTINENT HISTORY: Patient was diagnosed on 09/26/2020 with 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 It is ER/PR positive and HER2 negative with a Ki67 of 15%. She underwent neoadjuvant chemotherapy beginning 10/24/2020 for 2 rounds which was stopped due to elevated LFTs. She had a bilateral mastectomy with left axillary node dissection on  01/31/2021 with 15/15 nodes positive. She started Anastrozole on 12/06/2020. 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   PAIN:  Are you having pain? Yes NPRS scale:3/10 Pain location: throughout spine Pain orientation: Left  PAIN TYPE: burning and sharp Pain description: constant but worsens intermittently Aggravating factors: unsure Relieving factors: nothing  PRECAUTIONS: Other: bony mets  WEIGHT BEARING RESTRICTIONS: No  FALLS:  Has patient fallen in last 6 months? 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  LEISURE: ROM PT exercises  HAND DOMINANCE: right   PRIOR LEVEL OF FUNCTION: Independent  PATIENT GOALS: to be ok with the pain   OBJECTIVE:  COGNITION: Overall cognitive status: Within functional limits for tasks assessed   PALPATION: Increased muscle tightness in L side of back including upper traps, levator, rhomboids and paraspinal muscles  OBSERVATIONS / OTHER ASSESSMENTS: increased pec tightness on L side  POSTURE: forward head, rounded shoulders  UPPER EXTREMITY AROM/PROM: WFL   LOWER EXTREMITY MMT: 5/5  LEFS: 44     TODAY'S TREATMENT:                                                                                                                                         DATE:  02/06/23: Therapeutic Exercises Nu Step seat 5 UE 9, Lev 4 x 6 mins, 330 steps Meeks Decompression Exercises x 5, 5 sec holds reminding pt of technique of each with including arm reach with leg lengthener Posterior pelvic tilt x 10, 5 sec holds. Pt with much improved technique today with very mild cuing. Then hold pelvic tilt with the following: Alt UE flex 3 x 5, clams (open/close knees) 3 x 5 (struggled to hold tilt initially  but better control with smaller ROM), alt march 3 x 5, ball squeeze 4 x 5 and pt started noticing increased  muscle fatigue LTR with arms outstretched to ~ 45 degrees abd x 5 each direction with VC's to engage core after each rep to bring legs back to neutral SKTC x 3, 20 sec holds each side Seated EOB for bil HS stretch and then bil piriformis stretch x 2, 20 sec holds returning therapist demo for each Manual Therapy P/ROM to Lt  shoulder at end of session into flex and abd STM gently to Lt pect and insertion at end motions and where pt reports still feels the tightest and gets muscle cramps.   02/05/2023 Nu Step seat 5 UE 7, Lev 2 x 3 min and lev 4 x 2 min, 285 steps Meeks decompression x 3 ea with diaphragmatic breathing to start Left leg and arm lengthening in supine x 3  Posterior pelvic tilt; TC and verbal cues to correct Posterior pelvic tilt with alternate arms 3-5 ea x 5 repetitions. Ppt with marching x 5 ea, repeated 4 times Supine ppt with left and then right shoulder abd x 10 ea, then  5 LTR with arms outstretched x 3 ea not forcing through pain and keeping LB mainly down with ppt. Showed pt how to perform supine to and from sit to keep stress off back Updated HEP with Meeks decompression exs  01/30/23: Supine over 1/2 foam roll: alternating flexion x 10, horizontal abduction x 10 (stopped after 3 reps to perform gentle rib mobs due to cramping - improved after this), scaption x 10 with therapist providing stretch medial and downwards to L rib cage to help improve stretch Snow angels x 3 reps with 30 sec holds keeping hand on mat and pt not able to raise far due to tightness Pelvic tilt x 10 reps with 3 sec holds with pt returning therapist demo and providing pt with v/c and t/c Knee to chest stretch x 30 sec hold x 3 reps Pelvic tilts with knee bends x 10 reps each Pelvic tilts with small marches x 10 reps each Marjo Bicker pose with 30 sec holds then walking fingers to L and R side  each with 30 sec holds with increased stretch on L     PATIENT EDUCATION:  Education details: deep breathing to help open rib cage, gentle trunk rotation while keep back flat, pelvic tilts, importance of core strength to help decrease back pain Person educated: Patient Education method: Explanation, Demonstration, Tactile cues, and Verbal cues Education comprehension: verbalized understanding, returned demonstration, verbal cues required, tactile cues required, and needs further education  HOME EXERCISE PROGRAM: Deep breathing Lower trunk stretch with only minimal movement of lower extremities due to core weakness Access Code: [email or web address] URL: [email or web address] Date: 01/30/2023 Prepared by: Leonette Most  Exercises - Supine Double Knee to Chest  - 1 x daily - 7 x weekly - 1 sets - 3-5 reps - 30 sec hold - Supine Posterior Pelvic Tilt  - 1 x daily - 7 x weekly - 1 sets - 10 reps - 30 sec hold - Supine Posterior Pelvic Tilt with Knee Rocks  - 1 x daily - 7 x weekly - 1 sets - 10 reps - Supine March with Posterior Pelvic Tilt  - 1 x daily - 7 x weekly - 1 sets - 10 reps - Thoracic Foam Roll Mobilization Backstroke  - 1 x daily - 7 x weekly - 1 sets - 10 reps - Open Book Chest Rotation Stretch on Foam 1/2 Roll  - 1 x daily - 7 x weekly - 1 sets - 5 reps - 30 sec  hold - Thoracic Y on Foam Roll  - 1 x daily - 7 x weekly - 1 sets - 10 reps - Supine Static Chest Stretch on Foam Roll  - 1 x daily - 7 x weekly - 3 sets - 5 reps - 30 sec hold -Child's Pose with Sidebending  - 1 x  daily - 7 x weekly - 1 sets - 3 reps - 30 sec hold  ASSESSMENT:  CLINICAL IMPRESSION: Pt comes in reporting feeling much improved since last session yesterday. She reports her muscle cramps with muscle activation also is improving. Today continued with NuStep with resistance at level 4 for entire time and added 1 min. She did well with this at slow,controlled pace. Then continued with core stabs  progressing LE exs as she was able to perform correct pelvic tilt. Then added bil LE flexibility and brief Lt shoulder P/ROM and STM to pect tendon as pt reports muscle cramps here still at times.   OBJECTIVE IMPAIRMENTS: decreased strength, increased fascial restrictions, increased muscle spasms, postural dysfunction, and pain.   ACTIVITY LIMITATIONS: carrying and lifting  PARTICIPATION LIMITATIONS: community activity and occupation  PERSONAL FACTORS: Fitness and Time since onset of injury/illness/exacerbation are also affecting patient's functional outcome.   REHAB POTENTIAL: Good  CLINICAL DECISION MAKING: Stable/uncomplicated  EVALUATION COMPLEXITY: Low  GOALS: Goals reviewed with patient? Yes  SHORT TERM GOALS=LONG TERM GOALS Target date: 02/18/23  Pt will be able to engage core muscles when doing her ADLs and at work to help decrease back pain.  Baseline: Goal status: INITIAL  2.  Pt will report a 75% improvement in back pain to allow improved comfort.  Baseline:  Goal status: INITIAL  3.  Pt will report she is no longer having pain in area of her ribs to allow improved comfort.  Baseline:  Goal status: INITIAL  4.  Pt will be independent in a home exercise program for continued strengthening and stretching.  Baseline:  Goal status: INITIAL  PLAN:  PT FREQUENCY: 2x/week  PT DURATION: 4 weeks  PLANNED INTERVENTIONS: Therapeutic exercises, Therapeutic activity, Patient/Family education, Self Care, Joint mobilization, Taping, Manual therapy, and Re-evaluation  PLAN FOR NEXT SESSION: cont with core exercises - pelvic tilts and progress, Lt pec stretches and manual therapy to Lt pect prn for muscle cramps/spasms here, rib opening stretches   Hermenia Bers, PTA 02/06/2023, 9:02 AM

## 2023-02-11 ENCOUNTER — Ambulatory Visit: Payer: Medicaid Other | Admitting: Physical Therapy

## 2023-02-13 ENCOUNTER — Ambulatory Visit: Payer: Medicaid Other

## 2023-02-18 ENCOUNTER — Ambulatory Visit: Payer: Medicaid Other

## 2023-02-20 ENCOUNTER — Ambulatory Visit: Payer: Medicaid Other

## 2023-02-20 DIAGNOSIS — M549 Dorsalgia, unspecified: Secondary | ICD-10-CM | POA: Diagnosis not present

## 2023-02-20 DIAGNOSIS — Z170 Estrogen receptor positive status [ER+]: Secondary | ICD-10-CM

## 2023-02-20 DIAGNOSIS — R293 Abnormal posture: Secondary | ICD-10-CM

## 2023-02-20 DIAGNOSIS — M62830 Muscle spasm of back: Secondary | ICD-10-CM

## 2023-02-20 NOTE — Therapy (Signed)
OUTPATIENT PHYSICAL THERAPY ONCOLOGY TREATMENT  Patient Name: Gabriela Sullivan MRN: [id number] DOB:1991-04-15, 32 y.o., female Today's Date: 02/20/2023  END OF SESSION:  PT End of Session - 02/20/23 0807     Visit Number 5    Number of Visits 9    Date for PT Re-Evaluation 03/20/23    Authorization Type 7 visits 01/21/23-03/21/23    Authorization - Visit Number 5    Authorization - Number of Visits 7    PT Start Time 0803    PT Stop Time 0902    PT Time Calculation (min) 59 min    Activity Tolerance Patient tolerated treatment well    Behavior During Therapy [facility] for tasks assessed/performed             Past Medical History:  Diagnosis Date   Asthma    remote   Breast cancer (HCC)    Elevated liver enzymes    Family history of breast cancer    Family history of ovarian cancer    Family history of prostate cancer    Family history of stomach cancer    Hepatic steatosis    Hyperglycemia    A1c 7.6% 12/19/20   Migraines    PCOS (polycystic ovarian syndrome)    Pre-diabetes    Past Surgical History:  Procedure Laterality Date   MASTECTOMY W/ SENTINEL NODE BIOPSY Right 01/31/2021   Procedure: RIGHT SIMPLE MASTECTOMY;  Surgeon: Harriette Bouillon, MD;  Location: [facility] OR;  Service: General;  Laterality: Right;   MODIFIED MASTECTOMY Left 01/31/2021   Procedure: LEFT MODIFIED RADICAL MASTECTOMY;  Surgeon: Harriette Bouillon, MD;  Location: [facility] OR;  Service: General;  Laterality: Left;   NO PAST SURGERIES     PORTACATH PLACEMENT Right 10/18/2020   Procedure: INSERTION PORT-A-CATH;  Surgeon: Harriette Bouillon, MD;  Location: [facility];  Service: General;  Laterality: Right;   Patient Active Problem List   Diagnosis Date Noted   Sepsis (HCC) 01/10/2023   Pancytopenia (HCC) 01/10/2023   Febrile neutropenia (HCC) 01/10/2023   Hypokalemia 01/10/2023   Hyponatremia 01/10/2023   Hypocalcemia 01/10/2023   Hyperbilirubinemia 01/10/2023   Elevated lipase 01/10/2023   Nausea,  vomiting and diarrhea 01/10/2023   Transaminitis 01/10/2023   Nonalcoholic steatohepatitis 06/21/2022   Type 2 diabetes mellitus (HCC) 06/20/2022   History of therapeutic radiation 11/07/2021   Left breast cancer with T3 tumor, >5 cm in greatest dimension (HCC) 01/31/2021   Bone metastases 12/28/2020   Port-A-Cath in place 12/06/2020   Genetic testing 11/25/2020   Goals of care, counseling/discussion 10/25/2020   Lytic bone lesions on xray 10/16/2020   Family history of ovarian cancer    Family history of prostate cancer    Family history of breast cancer    Family history of stomach cancer    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   Malignant neoplasm of upper-outer quadrant of left breast in female, estrogen receptor positive (HCC) 09/29/2020   Elevated LFTs     PCP: Yvone Neu, MD  REFERRING PROVIDER: [name], MD   REFERRING DIAG: M54.9 (ICD-10-CM) - Back pain, unspecified back location, unspecified back pain laterality, unspecified chronicity   THERAPY DIAG:  Back pain, unspecified back location, unspecified back pain laterality, unspecified chronicity  Muscle spasm of back  Abnormal posture  Malignant neoplasm of upper-outer quadrant of left breast in female, estrogen receptor positive (HCC)  ONSET DATE: 12/31/22  Rationale for Evaluation and Treatment: Rehabilitation  SUBJECTIVE:  SUBJECTIVE STATEMENT: My mom is finally out of the hospital and slowly doing better. She is on the kidney transplant list and needs dialysis and she was in and out of the hospital due to that. I have been mostly keeping up with my exercises as I could being in and out of the hospital with her. I would like to cont physical therapy for a few more weeks but just 1x/wk to help my LBP  improve more.   PERTINENT HISTORY: Patient was diagnosed on 09/26/2020 with 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 It is ER/PR positive and HER2 negative with a Ki67 of 15%. She underwent neoadjuvant chemotherapy beginning 10/24/2020 for 2 rounds which was stopped due to elevated LFTs. She had a bilateral mastectomy with left axillary node dissection on 01/31/2021 with 15/15 nodes positive. She started Anastrozole on 12/06/2020. 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   PAIN:  Are you having pain? Yes NPRS scale:5/10 Pain location: low back Pain orientation: left and back PAIN TYPE: burning  Pain description: intermittent Aggravating factors: not keeping up with HEP, under stress from my mom recently Relieving factors: stretches, postural awareness  PRECAUTIONS: Other: bony mets  WEIGHT BEARING RESTRICTIONS: No  FALLS:  Has patient fallen in last 6 months? 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  LEISURE: ROM PT exercises  HAND DOMINANCE: right   PRIOR LEVEL OF FUNCTION: Independent  PATIENT GOALS: to be ok with the pain   OBJECTIVE:  COGNITION: Overall cognitive status: Within functional limits for tasks assessed   PALPATION: Increased muscle tightness in L side of back including upper traps, levator, rhomboids and paraspinal muscles  OBSERVATIONS / OTHER ASSESSMENTS: increased pec tightness on L side  POSTURE: forward head, rounded shoulders  UPPER EXTREMITY AROM/PROM: WFL   LOWER EXTREMITY MMT: 5/5  LEFS: 44     TODAY'S TREATMENT:                                                                                                                                         DATE:   02/20/23: Self Care Spent time at beginning of session discussing pts current functional status and assessing goals for renewal.  Therapeutic Exercises Nu Step seat 5 UE 9, Lev 4 x 10 mins discussing postural awareness and importance of continuing to incorporate stretches into her day Supine over half foam roll for following: Bil UE horz abd x10, bil UE scaption into a "V" x 10, then bil UE abd into a "snow angel"  x 10, 5 sec holds; then pelvic stabs: Posterior pelvic tilts x 10, 3 sec holds, then tilt with following still  on foam roll: knees open/close 2 x 10, then alt marching 2 x 5 each leg, dead bug (alt UE/LE flexion) 2 x 5 each side , these were challenging for pt  SKTC x 1, 20 sec holds; then seated EOB for bil HS stretch, then bil figure 4 piriformis stretch x 2, 20 sec holds each returning therapist demo Doorway Pectoralis stretch x 2, 20 sec holds returning demo Manual Therapy P/ROM to Lt shoulder at end of session into flex, abd, and D2 with scapular depression by therapist throughout STM gently to Lt pect and insertion  02/06/23: Therapeutic Exercises Nu Step seat 5 UE 9, Lev 4 x 6 mins, 330 steps Meeks Decompression Exercises x 5, 5 sec holds reminding pt of technique of each with including arm reach with leg lengthener Posterior pelvic tilt x 10, 5 sec holds. Pt with much improved technique today with very mild cuing. Then hold pelvic tilt with the following: Alt UE flex 3 x 5, clams (open/close knees) 3 x 5 (struggled to hold tilt initially but better control with smaller ROM), alt march 3 x 5, ball squeeze 4 x 5 and pt started noticing increased  muscle fatigue LTR with arms outstretched to ~ 45 degrees abd x 5 each direction with VC's to engage core after each rep to bring legs back to neutral SKTC x 3, 20 sec holds each side Seated EOB for bil HS stretch and then bil piriformis stretch x 2, 20 sec holds returning therapist demo for each Manual Therapy P/ROM to Lt shoulder at  end of session into flex and abd STM gently to Lt pect and insertion at end motions and where pt reports still feels the tightest and gets muscle cramps.   02/05/2023 Nu Step seat 5 UE 7, Lev 2 x 3 min and lev 4 x 2 min, 285 steps Meeks decompression x 3 ea with diaphragmatic breathing to start Left leg and arm lengthening in supine x 3  Posterior pelvic tilt; TC and verbal cues to correct Posterior pelvic tilt with alternate arms 3-5 ea x 5 repetitions. Ppt with marching x 5 ea, repeated 4 times Supine ppt with left and then right shoulder abd x 10 ea, then  5 LTR with arms outstretched x 3 ea not forcing through pain and keeping LB mainly down with ppt. Showed pt how to perform supine to and from sit to keep stress off back Updated HEP with Meeks decompression exs  01/30/23: Supine over 1/2 foam roll: alternating flexion x 10, horizontal abduction x 10 (stopped after 3 reps to perform gentle rib mobs due to cramping - improved after this), scaption x 10 with therapist providing stretch medial and downwards to L rib cage to help improve stretch Snow angels x 3 reps with 30 sec holds keeping hand on mat and pt not able to raise far due to tightness Pelvic tilt x 10 reps with 3 sec holds with pt returning therapist demo and providing pt with v/c and t/c Knee to chest stretch x 30 sec hold x 3 reps Pelvic tilts with knee bends x 10 reps each Pelvic tilts with small marches x 10 reps each Marjo Bicker pose with 30 sec holds then walking fingers to L and R side each with 30 sec holds with increased stretch on L     PATIENT EDUCATION:  Education details: deep breathing to help open rib cage, gentle trunk rotation while keep back flat, pelvic tilts, importance of core strength to help decrease back  pain Person educated: Patient Education method: Explanation, Demonstration, Tactile cues, and Verbal cues Education comprehension: verbalized understanding, returned demonstration, verbal cues required,  tactile cues required, and needs further education  HOME EXERCISE PROGRAM: Deep breathing Lower trunk stretch with only minimal movement of lower extremities due to core weakness Access Code: [email or web address] URL: [email or web address] Date: 01/30/2023 Prepared by: Leonette Most  Exercises - Supine Double Knee to Chest  - 1 x daily - 7 x weekly - 1 sets - 3-5 reps - 30 sec hold - Supine Posterior Pelvic Tilt  - 1 x daily - 7 x weekly - 1 sets - 10 reps - 30 sec hold - Supine Posterior Pelvic Tilt with Knee Rocks  - 1 x daily - 7 x weekly - 1 sets - 10 reps - Supine March with Posterior Pelvic Tilt  - 1 x daily - 7 x weekly - 1 sets - 10 reps - Thoracic Foam Roll Mobilization Backstroke  - 1 x daily - 7 x weekly - 1 sets - 10 reps - Open Book Chest Rotation Stretch on Foam 1/2 Roll  - 1 x daily - 7 x weekly - 1 sets - 5 reps - 30 sec  hold - Thoracic Y on Foam Roll  - 1 x daily - 7 x weekly - 1 sets - 10 reps - Supine Static Chest Stretch on Foam Roll  - 1 x daily - 7 x weekly - 3 sets - 5 reps - 30 sec hold -Child's Pose with Sidebending  - 1 x daily - 7 x weekly - 1 sets - 3 reps - 30 sec hold  ASSESSMENT:  CLINICAL IMPRESSION: Pt returns after missing a few sessions due to her mom has been in and out of the hospital and she has been her transportation. Overall pt is feeling much improved since start of care reporting her LBP is now more localized to low back and not entire spine, also mostly only has pain a few times a week now. She would like to cont physical therapy for a few more sessions to finalize progression of HEP and work towards having 80% reduction of back pain, currently reports 70%. She has met reduction of rib pain goal and is progressing towards HEP goal. Renewal today is MD renewal for date extension, not visits as her Medicaid cert allows 2 more visit until 03/21/23.  OBJECTIVE IMPAIRMENTS: decreased strength, increased fascial restrictions, increased muscle  spasms, postural dysfunction, and pain.   ACTIVITY LIMITATIONS: carrying and lifting  PARTICIPATION LIMITATIONS: community activity and occupation  PERSONAL FACTORS: Fitness and Time since onset of injury/illness/exacerbation are also affecting patient's functional outcome.   REHAB POTENTIAL: Good  CLINICAL DECISION MAKING: Stable/uncomplicated  EVALUATION COMPLEXITY: Low  GOALS: Goals reviewed with patient? Yes  SHORT TERM GOALS=LONG TERM GOALS Target date: 02/18/23  Pt will be able to engage core muscles when doing her ADLs and at work to help decrease back pain.  Baseline: Goal status: MET  2.  Pt will report a 80% improvement in back pain to allow improved comfort.  Baseline: 02/20/23 - 70% improvement at this time Goal status: PARTIALLY MET, GOAL UPDATED  3.  Pt will report she is no longer having pain in area of her ribs to allow improved comfort.  Baseline: Eval - constant; 02/20/23 - maybe 1x/wk and very mild discomfort Goal status: MET  4.  Pt will be independent in a home exercise program for continued strengthening and stretching.  Baseline: Pt is independent  with current HEP Goal status: PARTIALLY MET  PLAN:  PT FREQUENCY: 2x/week  PT DURATION: 4 weeks  PLANNED INTERVENTIONS: Therapeutic exercises, Therapeutic activity, Patient/Family education, Self Care, Joint mobilization, Taping, Manual therapy, and Re-evaluation  PLAN FOR NEXT SESSION: Renewal done this session. cont with core exercises - pelvic tilts and progress, Lt pec stretches and manual therapy to Lt pect prn for muscle cramps/spasms here, rib opening stretches   Hermenia Bers, PTA 02/20/2023, 12:16 PM

## 2023-02-21 NOTE — Addendum Note (Signed)
Addended by: Leonette Most L on: 02/21/2023 04:27 PM   Modules accepted: Orders

## 2023-02-24 NOTE — Assessment & Plan Note (Signed)
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% 09/25/2020: Right breast biopsy: DCIS grade 1, ER/PR positive 10/06/2020  breast MRI showed a 11 cm area of malignancy, with a second 1.9 cm mass, and skin thickening concerning for inflammatory breast carcinoma; at least 6 abnormal lymph nodes noted 10/13/2020 CT chest shows clear lungs and no obvious liver involvement but multiple lytic bone lesions in addition to the findings and MRI above 10/21/2020 spine MRI: Shows widely scattered thoracic and lumbar vertebral body metastases 10/24/2020: 2 cycles of dose dense Adriamycin and Cytoxan discontinued because of rising LFTs 12/01/2020: MRI abdomen: No evidence of metastatic disease in the liver 01/31/2021:Left mastectomy: Residual multifocal IDC 2 cm with intermediate grade DCIS, 15/15 lymph nodes positive, margins negative, lymphovascular space invasion present, ER 60%, PR 10%, HER2 negative, Ki-67 15% Right mastectomy: Fibrocystic change no residual cancer or DCIS -------------------------------------------------------------------------------------------------------------------------------------------  Current treatment: Anastrozole with goserelin and Verzenio and Zometa (since August 2022)   Anastrozole toxicities: Tolerating it very well   07/01/2022: Stable bone metastases similar 8 mm left axillary lymph node. 12/31/2022: CT CAP: No significant interval change in pulm metastases.  No evidence of new metastatic disease.  8 mm left axillary lymph node stable (stomach bug was in ED with nausea): Now resolved 01/05/2023: Abdominal pain: CT abdomen: Negative, CT head: Negative Hospitalization 01/24/2023-01/28/2023: diarrhea and fever   Continue with Zometa every 3 months.  Continue Zoladex injections Return to clinic in 3 months with labs and follow-up.

## 2023-02-27 ENCOUNTER — Other Ambulatory Visit: Payer: Self-pay | Admitting: Hematology and Oncology

## 2023-02-27 ENCOUNTER — Inpatient Hospital Stay (HOSPITAL_BASED_OUTPATIENT_CLINIC_OR_DEPARTMENT_OTHER): Payer: Medicaid Other | Admitting: Hematology and Oncology

## 2023-02-27 ENCOUNTER — Inpatient Hospital Stay: Payer: Medicaid Other

## 2023-02-27 ENCOUNTER — Inpatient Hospital Stay: Payer: Medicaid Other | Attending: Oncology

## 2023-02-27 ENCOUNTER — Other Ambulatory Visit: Payer: Self-pay

## 2023-02-27 VITALS — BP 126/82 | HR 85 | Temp 97.2°F | Resp 18 | Ht 60.0 in | Wt 170.4 lb

## 2023-02-27 DIAGNOSIS — Z95828 Presence of other vascular implants and grafts: Secondary | ICD-10-CM

## 2023-02-27 DIAGNOSIS — D0511 Intraductal carcinoma in situ of right breast: Secondary | ICD-10-CM | POA: Diagnosis not present

## 2023-02-27 DIAGNOSIS — C7951 Secondary malignant neoplasm of bone: Secondary | ICD-10-CM

## 2023-02-27 DIAGNOSIS — Z5111 Encounter for antineoplastic chemotherapy: Secondary | ICD-10-CM | POA: Insufficient documentation

## 2023-02-27 DIAGNOSIS — Z170 Estrogen receptor positive status [ER+]: Secondary | ICD-10-CM | POA: Diagnosis not present

## 2023-02-27 DIAGNOSIS — Z9013 Acquired absence of bilateral breasts and nipples: Secondary | ICD-10-CM | POA: Diagnosis not present

## 2023-02-27 DIAGNOSIS — Z79811 Long term (current) use of aromatase inhibitors: Secondary | ICD-10-CM | POA: Insufficient documentation

## 2023-02-27 DIAGNOSIS — C50412 Malignant neoplasm of upper-outer quadrant of left female breast: Secondary | ICD-10-CM

## 2023-02-27 LAB — CBC WITH DIFFERENTIAL (CANCER CENTER ONLY)
Abs Immature Granulocytes: 0.01 10*3/uL (ref 0.00–0.07)
Basophils Absolute: 0 10*3/uL (ref 0.0–0.1)
Basophils Relative: 2 %
Eosinophils Absolute: 0.1 10*3/uL (ref 0.0–0.5)
Eosinophils Relative: 2 %
HCT: 34.5 % — ABNORMAL LOW (ref 36.0–46.0)
Hemoglobin: 12.2 g/dL (ref 12.0–15.0)
Immature Granulocytes: 0 %
Lymphocytes Relative: 38 %
Lymphs Abs: 0.9 10*3/uL (ref 0.7–4.0)
MCH: 35.7 pg — ABNORMAL HIGH (ref 26.0–34.0)
MCHC: 35.4 g/dL (ref 30.0–36.0)
MCV: 100.9 fL — ABNORMAL HIGH (ref 80.0–100.0)
Monocytes Absolute: 0.1 10*3/uL (ref 0.1–1.0)
Monocytes Relative: 6 %
Neutro Abs: 1.1 10*3/uL — ABNORMAL LOW (ref 1.7–7.7)
Neutrophils Relative %: 52 %
Platelet Count: 181 10*3/uL (ref 150–400)
RBC: 3.42 MIL/uL — ABNORMAL LOW (ref 3.87–5.11)
RDW: 14 % (ref 11.5–15.5)
WBC Count: 2.2 10*3/uL — ABNORMAL LOW (ref 4.0–10.5)
nRBC: 0 % (ref 0.0–0.2)

## 2023-02-27 LAB — CMP (CANCER CENTER ONLY)
ALT: 57 U/L — ABNORMAL HIGH (ref 0–44)
AST: 43 U/L — ABNORMAL HIGH (ref 15–41)
Albumin: 4.2 g/dL (ref 3.5–5.0)
Alkaline Phosphatase: 52 U/L (ref 38–126)
Anion gap: 6 (ref 5–15)
BUN: 17 mg/dL (ref 6–20)
CO2: 27 mmol/L (ref 22–32)
Calcium: 9.4 mg/dL (ref 8.9–10.3)
Chloride: 109 mmol/L (ref 98–111)
Creatinine: 0.6 mg/dL (ref 0.44–1.00)
GFR, Estimated: >60 mL/min (ref >60)
Glucose, Bld: 94 mg/dL (ref 70–99)
Potassium: 3.7 mmol/L (ref 3.5–5.1)
Sodium: 142 mmol/L (ref 135–145)
Total Bilirubin: 0.7 mg/dL (ref 0.3–1.2)
Total Protein: 7.6 g/dL (ref 6.5–8.1)

## 2023-02-27 MED ORDER — GOSERELIN ACETATE 3.6 MG ~~LOC~~ IMPL
3.6000 mg | DRUG_IMPLANT | Freq: Once | SUBCUTANEOUS | Status: AC
  Administered 2023-02-27: 3.6 mg via SUBCUTANEOUS
  Filled 2023-02-27: qty 3.6

## 2023-02-27 NOTE — Progress Notes (Signed)
Specialty Pharmacy Refill Coordination Note  Gabriela Sullivan is a 32 y.o. female contacted today regarding refills of specialty medication(s) Abemaciclib .  Patient requested Delivery  on 03/04/23  to verified address [address]   [place] [address]   Medication will be filled on 03/03/23.

## 2023-02-27 NOTE — Assessment & Plan Note (Deleted)
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% 09/25/2020: Right breast biopsy: DCIS grade 1, ER/PR positive 10/06/2020  breast MRI showed a 11 cm area of malignancy, with a second 1.9 cm mass, and skin thickening concerning for inflammatory breast carcinoma; at least 6 abnormal lymph nodes noted 10/13/2020 CT chest shows clear lungs and no obvious liver involvement but multiple lytic bone lesions in addition to the findings and MRI above 10/21/2020 spine MRI: Shows widely scattered thoracic and lumbar vertebral body metastases 10/24/2020: 2 cycles of dose dense Adriamycin and Cytoxan discontinued because of rising LFTs 12/01/2020: MRI abdomen: No evidence of metastatic disease in the liver 01/31/2021:Left mastectomy: Residual multifocal IDC 2 cm with intermediate grade DCIS, 15/15 lymph nodes positive, margins negative, lymphovascular space invasion present, ER 60%, PR 10%, HER2 negative, Ki-67 15% Right mastectomy: Fibrocystic change no residual cancer or DCIS   Current treatment: Anastrozole with goserelin and Verzenio and Zometa  12/31/2022: CT CAP: No significant interval change in pulm metastases.  No evidence of new metastatic disease.  8 mm left axillary lymph node stable (stomach bug was in ED with nausea): Now resolved 01/05/2023: Abdominal pain: CT abdomen: Negative, CT head: Negative  Toxicities: Hospitalization 01/24/2023-01/28/2023: Febrile neutropenia (Granix injection) Diarrhea Hypokalemia

## 2023-02-27 NOTE — Progress Notes (Signed)
Patient Care Team: Leane Call, MD as PCP - General (Family Medicine) [name], RN as Oncology Nurse Navigator Pershing Proud, RN as Oncology Nurse Navigator Harriette Bouillon, MD as Consulting Physician (General Surgery) Dorothy Puffer, MD as Consulting Physician (Radiation Oncology) [name], MD as Consulting Physician (Hematology and Oncology)  DIAGNOSIS:  Encounter Diagnoses  Name Primary?   Malignant neoplasm of upper-outer quadrant of left breast in female, estrogen receptor positive (HCC) Yes   Ductal carcinoma in situ (DCIS) of right breast    Malignant neoplasm metastatic to bone (HCC)     SUMMARY OF ONCOLOGIC HISTORY: Oncology History  Ductal carcinoma in situ (DCIS) of right breast  09/29/2020 Initial Diagnosis   Ductal carcinoma in situ (DCIS) of right breast   10/04/2020 Cancer Staging   Staging form: Breast, AJCC 8th Edition - Clinical: Stage 0 (cTis (DCIS), cN0, cM0) - Signed by Lowella Dell, MD on 10/04/2020   11/25/2020 Genetic Testing   Negative genetic testing:  No pathogenic variants detected on the [employer] CancerNext-Expanded + RNAinsight panel. The report date is 11/25/2020.   The CancerNext-Expanded + RNAinsight gene panel offered by [employer] and includes sequencing and rearrangement analysis for the following 77 genes: AIP, ALK, APC, ATM, AXIN2, BAP1, BARD1, BLM, BMPR1A, BRCA1, BRCA2, BRIP1, CDC73, CDH1, CDK4, CDKN1B, CDKN2A, CHEK2, CTNNA1, DICER1, FANCC, FH, FLCN, GALNT12, KIF1B, LZTR1, MAX, MEN1, MET, MLH1, MSH2, MSH3, MSH6, MUTYH, NBN, NF1, NF2, NTHL1, PALB2, PHOX2B, PMS2, POT1, PRKAR1A, PTCH1, PTEN, RAD51C, RAD51D, RB1, RECQL, RET, SDHA, SDHAF2, SDHB, SDHC, SDHD, SMAD4, SMARCA4, SMARCB1, SMARCE1, STK11, SUFU, TMEM127, TP53, TSC1, TSC2, VHL and XRCC2 (sequencing and deletion/duplication); EGFR, EGLN1, HOXB13, KIT, MITF, PDGFRA, POLD1 and POLE (sequencing only); EPCAM and GREM1 (deletion/duplication only). RNA data is routinely analyzed  for use in variant interpretation for all genes.   Malignant neoplasm of upper-outer quadrant of left breast in female, estrogen receptor positive (HCC)  09/29/2020 Initial Diagnosis   Malignant neoplasm of upper-outer quadrant of left breast in female, estrogen receptor positive (HCC)   10/01/2020 Relapse/Recurrence   Spine MRI: Scattered thoracic and lumbar vertebral body and posterior element metastases   10/25/2020 - 11/10/2020 Chemotherapy   2 cycles of Adriamycin and Cytoxan.  Chemo discontinued because of rising LFTs.       11/25/2020 Genetic Testing   Negative genetic testing:  No pathogenic variants detected on the [employer] CancerNext-Expanded + RNAinsight panel. The report date is 11/25/2020.   The CancerNext-Expanded + RNAinsight gene panel offered by [employer] and includes sequencing and rearrangement analysis for the following 77 genes: AIP, ALK, APC, ATM, AXIN2, BAP1, BARD1, BLM, BMPR1A, BRCA1, BRCA2, BRIP1, CDC73, CDH1, CDK4, CDKN1B, CDKN2A, CHEK2, CTNNA1, DICER1, FANCC, FH, FLCN, GALNT12, KIF1B, LZTR1, MAX, MEN1, MET, MLH1, MSH2, MSH3, MSH6, MUTYH, NBN, NF1, NF2, NTHL1, PALB2, PHOX2B, PMS2, POT1, PRKAR1A, PTCH1, PTEN, RAD51C, RAD51D, RB1, RECQL, RET, SDHA, SDHAF2, SDHB, SDHC, SDHD, SMAD4, SMARCA4, SMARCB1, SMARCE1, STK11, SUFU, TMEM127, TP53, TSC1, TSC2, VHL and XRCC2 (sequencing and deletion/duplication); EGFR, EGLN1, HOXB13, KIT, MITF, PDGFRA, POLD1 and POLE (sequencing only); EPCAM and GREM1 (deletion/duplication only). RNA data is routinely analyzed for use in variant interpretation for all genes.    Anti-estrogen oral therapy   anastrozole started 12/06/2020 goserelin started 10/11/2020  Verzinio started Aug 2022 zolendronate started 11/02/2018. Every 12 weeks.   01/31/2021 Surgery   Left mastectomy: Residual multifocal IDC 2 cm with intermediate grade DCIS, 15/15 lymph nodes positive, margins negative, lymphovascular space invasion present, ER 60%, PR 10%, HER2 negative,  Ki-67 15% Right mastectomy: Fibrocystic change no residual cancer or DCIS     CHIEF COMPLIANT:   Discussed the use of AI scribe software for clinical note transcription with the patient, who gave verbal consent to proceed.  History of Present Illness   The patient, with a history of breast cancer on Verzenio, presents for a follow-up after a recent hospital stay for fevers. She reports that the fevers resolved after receiving antibiotics in the hospital. She resumed Verzenio immediately after discharge and deny any ongoing abdominal symptoms or diarrhea, stating she is 'back to normal.' The cause of the fever was not definitively identified during the hospital stay, but a CT scan did not reveal any concerning findings.  The patient also discusses plans for breast reconstruction, which she is considering undergoing in approximately two years. She expresses concern about the need to discontinue her medication around the time of the procedure.      ALLERGIES:  has No Known Allergies.  MEDICATIONS:  Current Outpatient Medications  Medication Sig Dispense Refill   abemaciclib (VERZENIO) 100 MG tablet Take 1 tablet (100 mg total) by mouth 2 (two) times daily. Swallow tablets whole. Do not chew, crush, or split tablets before swallowing. 56 tablet 3   anastrozole (ARIMIDEX) 1 MG tablet Take 1 tablet (1 mg total) by mouth daily. 90 tablet 3   calcium carbonate (TUMS - DOSED IN MG ELEMENTAL CALCIUM) 500 MG chewable tablet Chew 2 tablets by mouth every other day.     ondansetron (ZOFRAN-ODT) 4 MG disintegrating tablet Take 1 tablet (4 mg total) by mouth every 8 (eight) hours as needed for nausea or vomiting. 8 tablet 0   potassium chloride (KLOR-CON) 10 MEQ tablet Take 1 tablet (10 mEq total) by mouth daily. 20 tablet 0   Vitamin D, Ergocalciferol, (DRISDOL) 1.25 MG (50000 UNIT) CAPS capsule Take 50,000 Units by mouth once a week.     No current facility-administered medications for this visit.     PHYSICAL EXAMINATION: ECOG PERFORMANCE STATUS: 1 - Symptomatic but completely ambulatory  Vitals:   02/27/23 0802  BP: 126/82  Pulse: 85  Resp: 18  Temp: (!) 97.2 F (36.2 C)  SpO2: 100%   Filed Weights   02/27/23 0802  Weight: 170 lb 6.4 oz (77.3 kg)      LABORATORY DATA:  I have reviewed the data as listed    Latest Ref Rng & Units 01/28/2023   10:40 AM 01/27/2023    5:53 AM 01/26/2023    5:00 AM  CMP  Glucose 70 - 99 mg/dL 86  782  956   BUN 6 - 20 mg/dL 18  13  11    Creatinine 0.44 - 1.00 mg/dL 2.13  0.86  5.78   Sodium 135 - 145 mmol/L 138  138  139   Potassium 3.5 - 5.1 mmol/L 3.7  3.8  3.8   Chloride 98 - 111 mmol/L 109  109  110   CO2 22 - 32 mmol/L 23  26  26    Calcium 8.9 - 10.3 mg/dL 8.8  8.3  8.1   Total Protein 6.5 - 8.1 g/dL   6.3   Total Bilirubin 0.3 - 1.2 mg/dL   0.6   Alkaline Phos 38 - 126 U/L   45   AST 15 - 41 U/L   29   ALT 0 - 44 U/L   25     Lab Results  Component Value Date   WBC 2.2 (L) 02/27/2023  HGB 12.2 02/27/2023   HCT 34.5 (L) 02/27/2023   MCV 100.9 (H) 02/27/2023   PLT 181 02/27/2023   NEUTROABS 1.1 (L) 02/27/2023    ASSESSMENT & PLAN:  Malignant neoplasm of upper-outer quadrant of left breast in female, estrogen receptor positive [facility]) 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% 09/25/2020: Right breast biopsy: DCIS grade 1, ER/PR positive 10/06/2020  breast MRI showed a 11 cm area of malignancy, with a second 1.9 cm mass, and skin thickening concerning for inflammatory breast carcinoma; at least 6 abnormal lymph nodes noted 10/13/2020 CT chest shows clear lungs and no obvious liver involvement but multiple lytic bone lesions in addition to the findings and MRI above 10/21/2020 spine MRI: Shows widely scattered thoracic and lumbar vertebral body metastases 10/24/2020: 2 cycles of dose dense Adriamycin and Cytoxan discontinued because of rising  LFTs 12/01/2020: MRI abdomen: No evidence of metastatic disease in the liver 01/31/2021:Left mastectomy: Residual multifocal IDC 2 cm with intermediate grade DCIS, 15/15 lymph nodes positive, margins negative, lymphovascular space invasion present, ER 60%, PR 10%, HER2 negative, Ki-67 15% Right mastectomy: Fibrocystic change no residual cancer or DCIS -------------------------------------------------------------------------------------------------------------------------------------------  Current treatment: Anastrozole with goserelin and Verzenio and Zometa (since August 2022)   Anastrozole toxicities: Tolerating it very well   07/01/2022: Stable bone metastases similar 8 mm left axillary lymph node. 12/31/2022: CT CAP: No significant interval change in pulm metastases.  No evidence of new metastatic disease.  8 mm left axillary lymph node stable (stomach bug was in ED with nausea): Now resolved 01/05/2023: Abdominal pain: CT abdomen: Negative, CT head: Negative Hospitalization 01/24/2023-01/28/2023: diarrhea and fever   Continue with Zometa every 3 months.  Continue Zoladex injections Return to clinic in 3 months with labs and follow-up.     No orders of the defined types were placed in this encounter.  The patient has a good understanding of the overall plan. she agrees with it. she will call with any problems that may develop before the next visit here. Total time spent: 30 mins including face to face time and time spent for planning, charting and co-ordination of care   Tamsen Meek, MD 02/27/23

## 2023-03-03 ENCOUNTER — Inpatient Hospital Stay: Payer: Medicaid Other

## 2023-03-04 ENCOUNTER — Ambulatory Visit: Payer: Medicaid Other | Attending: Surgery

## 2023-03-04 DIAGNOSIS — Z170 Estrogen receptor positive status [ER+]: Secondary | ICD-10-CM | POA: Diagnosis present

## 2023-03-04 DIAGNOSIS — M549 Dorsalgia, unspecified: Secondary | ICD-10-CM | POA: Diagnosis present

## 2023-03-04 DIAGNOSIS — R293 Abnormal posture: Secondary | ICD-10-CM | POA: Diagnosis present

## 2023-03-04 DIAGNOSIS — M62830 Muscle spasm of back: Secondary | ICD-10-CM | POA: Diagnosis present

## 2023-03-04 DIAGNOSIS — C50412 Malignant neoplasm of upper-outer quadrant of left female breast: Secondary | ICD-10-CM | POA: Diagnosis present

## 2023-03-04 NOTE — Therapy (Signed)
OUTPATIENT PHYSICAL THERAPY ONCOLOGY TREATMENT  Patient Name: Gabriela Sullivan MRN: [id number] DOB:[birth year], 32 y.o., female Today's Date: 03/04/2023  END OF SESSION:  PT End of Session - 03/04/23 0805     Visit Number 6    Number of Visits 9    Date for PT Re-Evaluation 03/20/23    Authorization Type 7 visits 01/21/23-03/21/23    Authorization - Visit Number 6    Authorization - Number of Visits 7    PT Start Time 0803    PT Stop Time 0902    PT Time Calculation (min) 59 min    Activity Tolerance Patient tolerated treatment well    Behavior During Therapy [facility] for tasks assessed/performed             Past Medical History:  Diagnosis Date   Asthma    remote   Breast cancer (HCC)    Elevated liver enzymes    Family history of breast cancer    Family history of ovarian cancer    Family history of prostate cancer    Family history of stomach cancer    Hepatic steatosis    Hyperglycemia    A1c 7.6% 12/19/20   Migraines    PCOS (polycystic ovarian syndrome)    Pre-diabetes    Past Surgical History:  Procedure Laterality Date   MASTECTOMY W/ SENTINEL NODE BIOPSY Right 01/31/2021   Procedure: RIGHT SIMPLE MASTECTOMY;  Surgeon: Harriette Bouillon, MD;  Location: [facility] OR;  Service: General;  Laterality: Right;   MODIFIED MASTECTOMY Left 01/31/2021   Procedure: LEFT MODIFIED RADICAL MASTECTOMY;  Surgeon: Harriette Bouillon, MD;  Location: [facility] OR;  Service: General;  Laterality: Left;   NO PAST SURGERIES     PORTACATH PLACEMENT Right 10/18/2020   Procedure: INSERTION PORT-A-CATH;  Surgeon: Harriette Bouillon, MD;  Location: [facility];  Service: General;  Laterality: Right;   Patient Active Problem List   Diagnosis Date Noted   Sepsis (HCC) 01/10/2023   Pancytopenia (HCC) 01/10/2023   Febrile neutropenia (HCC) 01/10/2023   Hypokalemia 01/10/2023   Hyponatremia 01/10/2023   Hypocalcemia 01/10/2023   Hyperbilirubinemia 01/10/2023   Elevated lipase 01/10/2023   Nausea,  vomiting and diarrhea 01/10/2023   Transaminitis 01/10/2023   Nonalcoholic steatohepatitis 06/21/2022   Type 2 diabetes mellitus (HCC) 06/20/2022   History of therapeutic radiation 11/07/2021   Left breast cancer with T3 tumor, >5 cm in greatest dimension (HCC) 01/31/2021   Malignant neoplasm metastatic to bone (HCC) 12/28/2020   Port-A-Cath in place 12/06/2020   Genetic testing 11/25/2020   Goals of care, counseling/discussion 10/25/2020   Lytic bone lesions on xray 10/16/2020   Family history of ovarian cancer    Family history of prostate cancer    Family history of breast cancer    Family history of stomach cancer    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   Malignant neoplasm of upper-outer quadrant of left breast in female, estrogen receptor positive (HCC) 09/29/2020   Elevated LFTs     PCP: Yvone Neu, MD  REFERRING PROVIDER: [name], MD   REFERRING DIAG: M54.9 (ICD-10-CM) - Back pain, unspecified back location, unspecified back pain laterality, unspecified chronicity   THERAPY DIAG:  Back pain, unspecified back location, unspecified back pain laterality, unspecified chronicity  Muscle spasm of back  Abnormal posture  Malignant neoplasm of upper-outer quadrant of left breast in female, estrogen receptor positive (HCC)  ONSET DATE: 12/31/22  Rationale for Evaluation and Treatment: Rehabilitation  SUBJECTIVE:  SUBJECTIVE STATEMENT: 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 And my Lt pect has been feeling a bit tighter as we've been busier as well. Probably just from me not being able to stretch as much when we are really  busy. But normally I'm working my exercises into my work day.     PERTINENT HISTORY: Patient was diagnosed on 09/26/2020 with 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 It is ER/PR positive and HER2 negative with a Ki67 of 15%. She underwent neoadjuvant chemotherapy beginning 10/24/2020 for 2 rounds which was stopped due to elevated LFTs. She had a bilateral mastectomy with left axillary node dissection on 01/31/2021 with 15/15 nodes positive. She started Anastrozole on 12/06/2020. 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   PAIN:  Are you having pain? No   PRECAUTIONS: Other: bony mets  WEIGHT BEARING RESTRICTIONS: No  FALLS:  Has patient fallen in last 6 months? 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  LEISURE: ROM PT exercises  HAND DOMINANCE: right   PRIOR LEVEL OF FUNCTION: Independent  PATIENT GOALS: to be ok with the pain   OBJECTIVE:  COGNITION: Overall cognitive status: Within functional limits for tasks assessed   PALPATION: Increased muscle tightness in L side of back including upper traps, levator, rhomboids and paraspinal muscles  OBSERVATIONS / OTHER ASSESSMENTS: increased pec tightness on L side  POSTURE: forward head, rounded shoulders  UPPER EXTREMITY AROM/PROM: WFL   LOWER EXTREMITY MMT: 5/5  LEFS: 44     TODAY'S TREATMENT:                                                                                                                                         DATE:  03/04/23: Therapeutic Exercises Pulleys into flexion and abd x 2 mins each Roll yellow ball up wall x 10 into flex, and x 5 into Lt abd NuStep seat 5/UE 9; Lev 4 x 7 mins; 496 steps Quadruped for alt UE flex,  then alt LE ext x 10 each and then alt UE flex/LE ext x 5 with therapist initially providing tactile cuing to decrease pelvic compensations, then able to return correct demo.  Hip hiking in // bars on 6" step bil x 15 each but focus on Rt side which was deemed weaker side with quadruped exs; then SLS on blue oval ffor bil hip abd x 12 each with cuing for core engagement and for no hip er compensation; piriformis stretch x  1 after each rep in standing and holding bar; added these to HEP Manual Therapy P/ROM to Lt shoulder at end of session into flex, abd, and D2 with scapular depression by therapist throughout STM gently to Lt pect and insertion; aso brief trial of cupping with cocoa butter for 2 mins at Lt pect and lateral trunk where pt palpably very tight.   02/20/23: Self Care Spent time at beginning of session discussing pts current functional status and assessing goals for renewal.  Therapeutic Exercises Nu Step seat 5 UE 9, Lev 4 x 10 mins discussing postural awareness and importance of continuing to incorporate stretches into her day Supine over half foam roll for following: Bil UE horz abd x10, bil UE scaption into a "V" x 10, then bil UE abd into a "snow angel"  x 10, 5 sec holds; then pelvic stabs: Posterior pelvic tilts x 10, 3 sec holds, then tilt with following still on foam roll: knees open/close 2 x 10, then alt marching 2 x 5 each leg, dead bug (alt UE/LE flexion) 2 x 5 each side , these were challenging for pt  SKTC x 1, 20 sec holds; then seated EOB for bil HS stretch, then bil figure 4 piriformis stretch x 2, 20 sec holds each returning therapist demo Doorway Pectoralis stretch x 2, 20 sec holds returning demo Manual Therapy P/ROM to Lt shoulder at end of session into flex, abd, and D2 with scapular depression by therapist throughout STM gently to Lt pect and insertion  02/06/23: Therapeutic Exercises Nu Step seat 5 UE 9, Lev 4 x 6 mins, 330 steps Meeks Decompression Exercises x  5, 5 sec holds reminding pt of technique of each with including arm reach with leg lengthener Posterior pelvic tilt x 10, 5 sec holds. Pt with much improved technique today with very mild cuing. Then hold pelvic tilt with the following: Alt UE flex 3 x 5, clams (open/close knees) 3 x 5 (struggled to hold tilt initially but better control with smaller ROM), alt march 3 x 5, ball squeeze 4 x 5 and pt started noticing increased  muscle fatigue LTR with arms outstretched to ~ 45 degrees abd x 5 each direction with VC's to engage core after each rep to bring legs back to neutral SKTC x 3, 20 sec holds each side Seated EOB for bil HS stretch and then bil piriformis stretch x 2, 20 sec holds returning therapist demo for each Manual Therapy P/ROM to Lt shoulder at end of session into flex and abd STM gently to Lt pect and insertion at end motions and where pt reports still feels the tightest and gets muscle cramps.   02/05/2023 Nu Step seat 5 UE 7, Lev 2 x 3 min and lev 4 x 2 min, 285 steps Meeks decompression x 3 ea with diaphragmatic breathing to start Left leg and arm lengthening in supine x 3  Posterior pelvic tilt; TC and verbal cues to correct Posterior pelvic tilt with alternate arms 3-5 ea x 5 repetitions. Ppt with marching x 5 ea, repeated 4 times Supine ppt with left and then right shoulder abd x 10 ea, then  5 LTR with arms outstretched x 3 ea not forcing through pain and keeping LB mainly down with ppt. Showed pt how to perform supine to and from sit to keep stress off back Updated HEP with Meeks decompression exs  01/30/23: Supine over 1/2 foam roll: alternating flexion x 10, horizontal abduction x 10 (stopped after 3  reps to perform gentle rib mobs due to cramping - improved after this), scaption x 10 with therapist providing stretch medial and downwards to L rib cage to help improve stretch Snow angels x 3 reps with 30 sec holds keeping hand on mat and pt not able to raise far due to  tightness Pelvic tilt x 10 reps with 3 sec holds with pt returning therapist demo and providing pt with v/c and t/c Knee to chest stretch x 30 sec hold x 3 reps Pelvic tilts with knee bends x 10 reps each Pelvic tilts with small marches x 10 reps each Marjo Bicker pose with 30 sec holds then walking fingers to L and R side each with 30 sec holds with increased stretch on L     PATIENT EDUCATION:  Education details: Quadruped and Rt>Lt hip abductor and QL strengthening Person educated: Patient Education method: Explanation, Demonstration, Tactile cues, and Verbal cues Education comprehension: verbalized understanding, returned demonstration, verbal cues required, tactile cues required, and needs further education  HOME EXERCISE PROGRAM: Deep breathing Lower trunk stretch with only minimal movement of lower extremities due to core weakness Access Code: [email or web address] URL: [email or web address] Date: 01/30/2023 Prepared by: Leonette Most  Exercises - Supine Double Knee to Chest  - 1 x daily - 7 x weekly - 1 sets - 3-5 reps - 30 sec hold - Supine Posterior Pelvic Tilt  - 1 x daily - 7 x weekly - 1 sets - 10 reps - 30 sec hold - Supine Posterior Pelvic Tilt with Knee Rocks  - 1 x daily - 7 x weekly - 1 sets - 10 reps - Supine March with Posterior Pelvic Tilt  - 1 x daily - 7 x weekly - 1 sets - 10 reps - Thoracic Foam Roll Mobilization Backstroke  - 1 x daily - 7 x weekly - 1 sets - 10 reps - Open Book Chest Rotation Stretch on Foam 1/2 Roll  - 1 x daily - 7 x weekly - 1 sets - 5 reps - 30 sec  hold - Thoracic Y on Foam Roll  - 1 x daily - 7 x weekly - 1 sets - 10 reps - Supine Static Chest Stretch on Foam Roll  - 1 x daily - 7 x weekly - 3 sets - 5 reps - 30 sec hold -Child's Pose with Sidebending  - 1 x daily - 7 x weekly - 1 sets - 3 reps - 30 sec hold  Access Code: [email or web address] URL: [email or web address] Date: 03/04/2023 Prepared by: Berna Spare  Exercises - Quadruped Alternating Arm Lift  - 1 x daily - 7 x weekly - 10 reps - Quadruped Alternating Leg Extensions  - 1 x daily - 7 x weekly - 10 reps - Quadruped Pelvic Floor Contraction with Opposite Arm and Leg Lift  - 1 x daily - 7 x weekly - 10 reps - Standing Hip Hiking  - 1 x daily - 7 x weekly - 2 sets - 10 reps - 3 hold - Seated Piriformis Stretch with Trunk Bend  - 1 x daily - 7 x weekly - 3 reps - 20 hold  ASSESSMENT:  CLINICAL IMPRESSION: Pt reports she has been feeling much better overall since starting therapy. Her endurance has improved and now only has intermittent back discomfort and burning session when she is busy at work and doesn't have as much time to stretch during her shift. Today progressed her to include core strengthening in quadruped which pt was  challenged by. Noted her Rt abductor and QL were fairly weak as she was compensating, especially with bringing her Rt LE back to the starting position when in quadruped so added hip hiking and abduction to HEP to strengthen this as well.   OBJECTIVE IMPAIRMENTS: decreased strength, increased fascial restrictions, increased muscle spasms, postural dysfunction, and pain.   ACTIVITY LIMITATIONS: carrying and lifting  PARTICIPATION LIMITATIONS: community activity and occupation  PERSONAL FACTORS: Fitness and Time since onset of injury/illness/exacerbation are also affecting patient's functional outcome.   REHAB POTENTIAL: Good  CLINICAL DECISION MAKING: Stable/uncomplicated  EVALUATION COMPLEXITY: Low  GOALS: Goals reviewed with patient? Yes  SHORT TERM GOALS=LONG TERM GOALS Target date: 02/18/23  Pt will be able to engage core muscles when doing her ADLs and at work to help decrease back pain.  Baseline: Goal status: MET  2.  Pt will report a 80% improvement in back pain to allow improved comfort.  Baseline: 02/20/23 - 70% improvement at this time Goal status: PARTIALLY MET, GOAL UPDATED  3.  Pt  will report she is no longer having pain in area of her ribs to allow improved comfort.  Baseline: Eval - constant; 02/20/23 - maybe 1x/wk and very mild discomfort Goal status: MET  4.  Pt will be independent in a home exercise program for continued strengthening and stretching.  Baseline: Pt is independent with current HEP Goal status: PARTIALLY MET  PLAN:  PT FREQUENCY: 2x/week  PT DURATION: 4 weeks  PLANNED INTERVENTIONS: Therapeutic exercises, Therapeutic activity, Patient/Family education, Self Care, Joint mobilization, Taping, Manual therapy, and Re-evaluation  PLAN FOR NEXT SESSION: Review new exs; D/C next per POC?  with core exercises - pelvic tilts and progress, Lt pec stretches and manual therapy to Lt pect prn for muscle cramps/spasms here, rib opening stretches   Hermenia Bers, PTA 03/04/2023, 9:06 AM

## 2023-03-17 ENCOUNTER — Encounter: Payer: Self-pay | Admitting: Hematology and Oncology

## 2023-03-17 NOTE — Telephone Encounter (Signed)
error 

## 2023-03-20 ENCOUNTER — Ambulatory Visit: Payer: Medicaid Other

## 2023-03-20 ENCOUNTER — Other Ambulatory Visit (HOSPITAL_COMMUNITY): Payer: Self-pay | Admitting: Pharmacy Technician

## 2023-03-20 ENCOUNTER — Other Ambulatory Visit (HOSPITAL_COMMUNITY): Payer: Self-pay

## 2023-03-20 NOTE — Progress Notes (Signed)
Specialty Pharmacy Refill Coordination Note  Gabriela Sullivan is a 32 y.o. female contacted today regarding refills of specialty medication(s) Abemaciclib   Patient requested Delivery   Delivery date: 03/28/23   Verified address: [address]  [place]   Medication will be filled on 03/27/23.

## 2023-04-01 ENCOUNTER — Inpatient Hospital Stay: Payer: Medicaid Other

## 2023-04-01 ENCOUNTER — Other Ambulatory Visit: Payer: Self-pay | Admitting: *Deleted

## 2023-04-01 DIAGNOSIS — Z170 Estrogen receptor positive status [ER+]: Secondary | ICD-10-CM

## 2023-04-03 ENCOUNTER — Other Ambulatory Visit: Payer: Self-pay

## 2023-04-03 ENCOUNTER — Inpatient Hospital Stay: Payer: Medicaid Other | Attending: Oncology

## 2023-04-03 ENCOUNTER — Inpatient Hospital Stay: Payer: Medicaid Other

## 2023-04-03 ENCOUNTER — Inpatient Hospital Stay: Payer: Medicaid Other | Admitting: Hematology and Oncology

## 2023-04-03 VITALS — BP 112/81 | HR 64 | Temp 97.3°F | Resp 18 | Ht 60.0 in | Wt 168.1 lb

## 2023-04-03 DIAGNOSIS — Z95828 Presence of other vascular implants and grafts: Secondary | ICD-10-CM

## 2023-04-03 DIAGNOSIS — C7951 Secondary malignant neoplasm of bone: Secondary | ICD-10-CM | POA: Diagnosis not present

## 2023-04-03 DIAGNOSIS — C50412 Malignant neoplasm of upper-outer quadrant of left female breast: Secondary | ICD-10-CM | POA: Diagnosis not present

## 2023-04-03 DIAGNOSIS — Z79811 Long term (current) use of aromatase inhibitors: Secondary | ICD-10-CM | POA: Insufficient documentation

## 2023-04-03 DIAGNOSIS — Z9013 Acquired absence of bilateral breasts and nipples: Secondary | ICD-10-CM | POA: Insufficient documentation

## 2023-04-03 DIAGNOSIS — C7800 Secondary malignant neoplasm of unspecified lung: Secondary | ICD-10-CM | POA: Diagnosis not present

## 2023-04-03 DIAGNOSIS — Z1721 Progesterone receptor positive status: Secondary | ICD-10-CM | POA: Diagnosis not present

## 2023-04-03 DIAGNOSIS — Z170 Estrogen receptor positive status [ER+]: Secondary | ICD-10-CM | POA: Insufficient documentation

## 2023-04-03 DIAGNOSIS — Z5111 Encounter for antineoplastic chemotherapy: Secondary | ICD-10-CM | POA: Diagnosis present

## 2023-04-03 LAB — CMP (CANCER CENTER ONLY)
ALT: 17 U/L (ref 0–44)
AST: 22 U/L (ref 15–41)
Albumin: 4.4 g/dL (ref 3.5–5.0)
Alkaline Phosphatase: 45 U/L (ref 38–126)
Anion gap: 7 (ref 5–15)
BUN: 20 mg/dL (ref 6–20)
CO2: 27 mmol/L (ref 22–32)
Calcium: 9.5 mg/dL (ref 8.9–10.3)
Chloride: 107 mmol/L (ref 98–111)
Creatinine: 0.63 mg/dL (ref 0.44–1.00)
GFR, Estimated: >60 mL/min (ref >60)
Glucose, Bld: 86 mg/dL (ref 70–99)
Potassium: 3.5 mmol/L (ref 3.5–5.1)
Sodium: 141 mmol/L (ref 135–145)
Total Bilirubin: 0.8 mg/dL (ref 0.3–1.2)
Total Protein: 7.7 g/dL (ref 6.5–8.1)

## 2023-04-03 LAB — CBC WITH DIFFERENTIAL (CANCER CENTER ONLY)
Abs Immature Granulocytes: 0 10*3/uL (ref 0.00–0.07)
Basophils Absolute: 0 10*3/uL (ref 0.0–0.1)
Basophils Relative: 1 %
Eosinophils Absolute: 0.1 10*3/uL (ref 0.0–0.5)
Eosinophils Relative: 2 %
HCT: 36.1 % (ref 36.0–46.0)
Hemoglobin: 13.3 g/dL (ref 12.0–15.0)
Immature Granulocytes: 0 %
Lymphocytes Relative: 33 %
Lymphs Abs: 1.1 10*3/uL (ref 0.7–4.0)
MCH: 35.6 pg — ABNORMAL HIGH (ref 26.0–34.0)
MCHC: 36.8 g/dL — ABNORMAL HIGH (ref 30.0–36.0)
MCV: 96.5 fL (ref 80.0–100.0)
Monocytes Absolute: 0.2 10*3/uL (ref 0.1–1.0)
Monocytes Relative: 5 %
Neutro Abs: 2 10*3/uL (ref 1.7–7.7)
Neutrophils Relative %: 59 %
Platelet Count: 193 10*3/uL (ref 150–400)
RBC: 3.74 MIL/uL — ABNORMAL LOW (ref 3.87–5.11)
RDW: 11.6 % (ref 11.5–15.5)
WBC Count: 3.3 10*3/uL — ABNORMAL LOW (ref 4.0–10.5)
nRBC: 0 % (ref 0.0–0.2)

## 2023-04-03 MED ORDER — GOSERELIN ACETATE 3.6 MG ~~LOC~~ IMPL
3.6000 mg | DRUG_IMPLANT | Freq: Once | SUBCUTANEOUS | Status: AC
  Administered 2023-04-03: 3.6 mg via SUBCUTANEOUS
  Filled 2023-04-03: qty 3.6

## 2023-04-03 NOTE — Patient Instructions (Signed)
Goserelin Implant What is this medication? GOSERELIN (GOE se rel in) treats prostate cancer and breast cancer. It works by decreasing levels of the hormones testosterone and estrogen in the body. This prevents prostate and breast cancer cells from spreading or growing. It may also be used to treat endometriosis. This is a condition where the tissue that lines the uterus grows outside the uterus. It works by decreasing the amount of estrogen your body makes, which reduces heavy bleeding and pain. It can also be used to help thin the lining of the uterus before a surgery used to prevent or reduce heavy periods. This medicine may be used for other purposes; ask your health care provider or pharmacist if you have questions. COMMON BRAND NAME(S): Zoladex, Zoladex 3-Month What should I tell my care team before I take this medication? They need to know if you have any of these conditions: Bone problems Diabetes Heart disease History of irregular heartbeat or rhythm An unusual or allergic reaction to goserelin, other medications, foods, dyes, or preservatives Pregnant or trying to get pregnant Breastfeeding How should I use this medication? This medication is injected under the skin. It is given by your care team in a hospital or clinic setting. Talk to your care team about the use of this medication in children. Special care may be needed. Overdosage: If you think you have taken too much of this medicine contact a poison control center or emergency room at once. NOTE: This medicine is only for you. Do not share this medicine with others. What if I miss a dose? Keep appointments for follow-up doses. It is important not to miss your dose. Call your care team if you are unable to keep an appointment. What may interact with this medication? Do not take this medication with any of the following: Cisapride Dronedarone Pimozide Thioridazine This medication may also interact with the following: Other  medications that cause heart rhythm changes This list may not describe all possible interactions. Give your health care provider a list of all the medicines, herbs, non-prescription drugs, or dietary supplements you use. Also tell them if you smoke, drink alcohol, or use illegal drugs. Some items may interact with your medicine. What should I watch for while using this medication? Visit your care team for regular checks on your progress. Your symptoms may appear to get worse during the first weeks of this therapy. Tell your care team if your symptoms do not start to get better or if they get worse after this time. Using this medication for a long time may weaken your bones. If you smoke or frequently drink alcohol you may increase your risk of bone loss. A family history of osteoporosis, chronic use of medications for seizures (convulsions), or corticosteroids can also increase your risk of bone loss. The risk of bone fractures may be increased. Talk to your care team about your bone health. This medication may increase blood sugar. The risk may be higher in patients who already have diabetes. Ask your care team what you can do to lower your risk of diabetes while taking this medication. This medication should stop regular monthly menstruation in women. Tell your care team if you continue to menstruate. Talk to your care team if you wish to become pregnant or think you might be pregnant. This medication can cause serious birth defects if taken during pregnancy or for 12 weeks after stopping treatment. Talk to your care team about reliable forms of contraception. Do not breastfeed while taking this   medication. This medication may cause infertility. Talk to your care team if you are concerned about your fertility. What side effects may I notice from receiving this medication? Side effects that you should report to your care team as soon as possible: Allergic reactions--skin rash, itching, hives, swelling  of the face, lips, tongue, or throat Change in the amount of urine Heart attack--pain or tightness in the chest, shoulders, arms, or jaw, nausea, shortness of breath, cold or clammy skin, feeling faint or lightheaded Heart rhythm changes--fast or irregular heartbeat, dizziness, feeling faint or lightheaded, chest pain, trouble breathing High blood sugar (hyperglycemia)--increased thirst or amount of urine, unusual weakness or fatigue, blurry vision High calcium level--increased thirst or amount of urine, nausea, vomiting, confusion, unusual weakness or fatigue, bone pain Pain, redness, irritation, or bruising at the injection site Severe back pain, numbness or weakness of the hands, arms, legs, or feet, loss of coordination, loss of bowel or bladder control Stroke--sudden numbness or weakness of the face, arm, or leg, trouble speaking, confusion, trouble walking, loss of balance or coordination, dizziness, severe headache, change in vision Swelling and pain of the tumor site or lymph nodes Trouble passing urine Side effects that usually do not require medical attention (report to your care team if they continue or are bothersome): Change in sex drive or performance Headache Hot flashes Rapid or extreme change in emotion or mood Sweating Swelling of the ankles, hands, or feet Unusual vaginal discharge, itching, or odor This list may not describe all possible side effects. Call your doctor for medical advice about side effects. You may report side effects to FDA at 1-800-FDA-1088. Where should I keep my medication? This medication is given in a hospital or clinic. It will not be stored at home. NOTE: This sheet is a summary. It may not cover all possible information. If you have questions about this medicine, talk to your doctor, pharmacist, or health care provider.  2024 Elsevier/Gold Standard (2021-10-11 00:00:00)  

## 2023-04-03 NOTE — Assessment & Plan Note (Signed)
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% 09/25/2020: Right breast biopsy: DCIS grade 1, ER/PR positive 10/06/2020  breast MRI showed a 11 cm area of malignancy, with a second 1.9 cm mass, and skin thickening concerning for inflammatory breast carcinoma; at least 6 abnormal lymph nodes noted 10/13/2020 CT chest shows clear lungs and no obvious liver involvement but multiple lytic bone lesions in addition to the findings and MRI above 10/21/2020 spine MRI: Shows widely scattered thoracic and lumbar vertebral body metastases 10/24/2020: 2 cycles of dose dense Adriamycin and Cytoxan discontinued because of rising LFTs 12/01/2020: MRI abdomen: No evidence of metastatic disease in the liver 01/31/2021:Left mastectomy: Residual multifocal IDC 2 cm with intermediate grade DCIS, 15/15 lymph nodes positive, margins negative, lymphovascular space invasion present, ER 60%, PR 10%, HER2 negative, Ki-67 15% Right mastectomy: Fibrocystic change no residual cancer or DCIS -------------------------------------------------------------------------------------------------------------------------------------------  Current treatment: Anastrozole with goserelin and Verzenio and Zometa (since August 2022)   Anastrozole toxicities: Tolerating it very well   07/01/2022: Stable bone metastases similar 8 mm left axillary lymph node. 12/31/2022: CT CAP: No significant interval change in pulm metastases.  No evidence of new metastatic disease.  8 mm left axillary lymph node stable (stomach bug was in ED with nausea): Now resolved 01/05/2023: Abdominal pain: CT abdomen: Negative, CT head: Negative Hospitalization 01/24/2023-01/28/2023: diarrhea and fever   Continue with Zometa every 3 months.  Continue Zoladex injections Return to clinic in 3 months with labs, scans and follow-up.

## 2023-04-03 NOTE — Progress Notes (Signed)
Patient Care Team: Leane Call, MD as PCP - General (Family Medicine) [name], RN as Oncology Nurse Navigator Pershing Proud, RN as Oncology Nurse Navigator Harriette Bouillon, MD as Consulting Physician (General Surgery) Dorothy Puffer, MD as Consulting Physician (Radiation Oncology) [name], MD as Consulting Physician (Hematology and Oncology)  DIAGNOSIS:  Encounter Diagnoses  Name Primary?   Malignant neoplasm of upper-outer quadrant of left breast in female, estrogen receptor positive (HCC) Yes   Metastasis to bone (HCC)     SUMMARY OF ONCOLOGIC HISTORY: Oncology History  Ductal carcinoma in situ (DCIS) of right breast  09/29/2020 Initial Diagnosis   Ductal carcinoma in situ (DCIS) of right breast   10/04/2020 Cancer Staging   Staging form: Breast, AJCC 8th Edition - Clinical: Stage 0 (cTis (DCIS), cN0, cM0) - Signed by Lowella Dell, MD on 10/04/2020   11/25/2020 Genetic Testing   Negative genetic testing:  No pathogenic variants detected on the [employer] CancerNext-Expanded + RNAinsight panel. The report date is 11/25/2020.   The CancerNext-Expanded + RNAinsight gene panel offered by [employer] and includes sequencing and rearrangement analysis for the following 77 genes: AIP, ALK, APC, ATM, AXIN2, BAP1, BARD1, BLM, BMPR1A, BRCA1, BRCA2, BRIP1, CDC73, CDH1, CDK4, CDKN1B, CDKN2A, CHEK2, CTNNA1, DICER1, FANCC, FH, FLCN, GALNT12, KIF1B, LZTR1, MAX, MEN1, MET, MLH1, MSH2, MSH3, MSH6, MUTYH, NBN, NF1, NF2, NTHL1, PALB2, PHOX2B, PMS2, POT1, PRKAR1A, PTCH1, PTEN, RAD51C, RAD51D, RB1, RECQL, RET, SDHA, SDHAF2, SDHB, SDHC, SDHD, SMAD4, SMARCA4, SMARCB1, SMARCE1, STK11, SUFU, TMEM127, TP53, TSC1, TSC2, VHL and XRCC2 (sequencing and deletion/duplication); EGFR, EGLN1, HOXB13, KIT, MITF, PDGFRA, POLD1 and POLE (sequencing only); EPCAM and GREM1 (deletion/duplication only). RNA data is routinely analyzed for use in variant interpretation for all genes.   Malignant neoplasm  of upper-outer quadrant of left breast in female, estrogen receptor positive (HCC)  09/29/2020 Initial Diagnosis   Malignant neoplasm of upper-outer quadrant of left breast in female, estrogen receptor positive (HCC)   10/01/2020 Relapse/Recurrence   Spine MRI: Scattered thoracic and lumbar vertebral body and posterior element metastases   10/25/2020 - 11/10/2020 Chemotherapy   2 cycles of Adriamycin and Cytoxan.  Chemo discontinued because of rising LFTs.       11/25/2020 Genetic Testing   Negative genetic testing:  No pathogenic variants detected on the [employer] CancerNext-Expanded + RNAinsight panel. The report date is 11/25/2020.   The CancerNext-Expanded + RNAinsight gene panel offered by [employer] and includes sequencing and rearrangement analysis for the following 77 genes: AIP, ALK, APC, ATM, AXIN2, BAP1, BARD1, BLM, BMPR1A, BRCA1, BRCA2, BRIP1, CDC73, CDH1, CDK4, CDKN1B, CDKN2A, CHEK2, CTNNA1, DICER1, FANCC, FH, FLCN, GALNT12, KIF1B, LZTR1, MAX, MEN1, MET, MLH1, MSH2, MSH3, MSH6, MUTYH, NBN, NF1, NF2, NTHL1, PALB2, PHOX2B, PMS2, POT1, PRKAR1A, PTCH1, PTEN, RAD51C, RAD51D, RB1, RECQL, RET, SDHA, SDHAF2, SDHB, SDHC, SDHD, SMAD4, SMARCA4, SMARCB1, SMARCE1, STK11, SUFU, TMEM127, TP53, TSC1, TSC2, VHL and XRCC2 (sequencing and deletion/duplication); EGFR, EGLN1, HOXB13, KIT, MITF, PDGFRA, POLD1 and POLE (sequencing only); EPCAM and GREM1 (deletion/duplication only). RNA data is routinely analyzed for use in variant interpretation for all genes.    Anti-estrogen oral therapy   anastrozole started 12/06/2020 goserelin started 10/11/2020  Verzinio started Aug 2022 zolendronate started 11/02/2018. Every 12 weeks.   01/31/2021 Surgery   Left mastectomy: Residual multifocal IDC 2 cm with intermediate grade DCIS, 15/15 lymph nodes positive, margins negative, lymphovascular space invasion present, ER 60%, PR 10%, HER2 negative, Ki-67 15% Right mastectomy: Fibrocystic change no residual cancer or  DCIS  CHIEF COMPLIANT: Follow-up of metastatic breast cancer  Discussed the use of AI scribe software for clinical note transcription with the patient, who gave verbal consent to proceed.  History of Present Illness   The patient, with a history of cancer, presents for a follow-up visit. She is currently on Verzenio, Zoladex, and Zemaira. She denies any side effects from [facility], specifically denying diarrhea. She also denies any issues with the Zoladex injections or Zemaira infusions.  In August, she had a CT scan due to a severe illness that occurred after her last visit. She reports that she "kept getting worse and worse." However, the CT scan results were unremarkable.  The patient also reports that she initially experienced hot flashes with her medication, but these have since resolved. She feels that her symptoms have "settled down."  She is due for a Zometa infusion in December.         ALLERGIES:  has No Known Allergies.  MEDICATIONS:  Current Outpatient Medications  Medication Sig Dispense Refill   abemaciclib (VERZENIO) 100 MG tablet Take 1 tablet (100 mg total) by mouth 2 (two) times daily. Swallow tablets whole. Do not chew, crush, or split tablets before swallowing. 56 tablet 3   anastrozole (ARIMIDEX) 1 MG tablet Take 1 tablet (1 mg total) by mouth daily. 90 tablet 3   calcium carbonate (TUMS - DOSED IN MG ELEMENTAL CALCIUM) 500 MG chewable tablet Chew 2 tablets by mouth every other day.     ondansetron (ZOFRAN-ODT) 4 MG disintegrating tablet Take 1 tablet (4 mg total) by mouth every 8 (eight) hours as needed for nausea or vomiting. 8 tablet 0   potassium chloride (KLOR-CON) 10 MEQ tablet Take 1 tablet (10 mEq total) by mouth daily. 20 tablet 0   Vitamin D, Ergocalciferol, (DRISDOL) 1.25 MG (50000 UNIT) CAPS capsule Take 50,000 Units by mouth once a week.     No current facility-administered medications for this visit.   Facility-Administered Medications Ordered  in Other Visits  Medication Dose Route Frequency Provider Last Rate Last Admin   goserelin (ZOLADEX) injection 3.6 mg  3.6 mg Subcutaneous Once [name], MD        PHYSICAL EXAMINATION: ECOG PERFORMANCE STATUS: 1 - Symptomatic but completely ambulatory  Vitals:   04/03/23 0835  BP: 112/81  Pulse: 64  Resp: 18  Temp: (!) 97.3 F (36.3 C)  SpO2: 100%   Filed Weights   04/03/23 0835  Weight: 168 lb 1.6 oz (76.2 kg)    Physical Exam          (exam performed in the presence of a chaperone)  LABORATORY DATA:  I have reviewed the data as listed    Latest Ref Rng & Units 04/03/2023    8:14 AM 02/27/2023    7:48 AM 01/28/2023   10:40 AM  CMP  Glucose 70 - 99 mg/dL 86  94  86   BUN 6 - 20 mg/dL 20  17  18    Creatinine 0.44 - 1.00 mg/dL 2.95  6.21  3.08   Sodium 135 - 145 mmol/L 141  142  138   Potassium 3.5 - 5.1 mmol/L 3.5  3.7  3.7   Chloride 98 - 111 mmol/L 107  109  109   CO2 22 - 32 mmol/L 27  27  23    Calcium 8.9 - 10.3 mg/dL 9.5  9.4  8.8   Total Protein 6.5 - 8.1 g/dL 7.7  7.6    Total Bilirubin 0.3 - 1.2 mg/dL  0.8  0.7    Alkaline Phos 38 - 126 U/L 45  52    AST 15 - 41 U/L 22  43    ALT 0 - 44 U/L 17  57      Lab Results  Component Value Date   WBC 3.3 (L) 04/03/2023   HGB 13.3 04/03/2023   HCT 36.1 04/03/2023   MCV 96.5 04/03/2023   PLT 193 04/03/2023   NEUTROABS 2.0 04/03/2023    ASSESSMENT & PLAN:  Malignant neoplasm of upper-outer quadrant of left breast in female, estrogen receptor positive [facility]) 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% 09/25/2020: Right breast biopsy: DCIS grade 1, ER/PR positive 10/06/2020  breast MRI showed a 11 cm area of malignancy, with a second 1.9 cm mass, and skin thickening concerning for inflammatory breast carcinoma; at least 6 abnormal lymph nodes noted 10/13/2020 CT chest shows clear lungs and no obvious liver involvement but multiple  lytic bone lesions in addition to the findings and MRI above 10/21/2020 spine MRI: Shows widely scattered thoracic and lumbar vertebral body metastases 10/24/2020: 2 cycles of dose dense Adriamycin and Cytoxan discontinued because of rising LFTs 12/01/2020: MRI abdomen: No evidence of metastatic disease in the liver 01/31/2021:Left mastectomy: Residual multifocal IDC 2 cm with intermediate grade DCIS, 15/15 lymph nodes positive, margins negative, lymphovascular space invasion present, ER 60%, PR 10%, HER2 negative, Ki-67 15% Right mastectomy: Fibrocystic change no residual cancer or DCIS -------------------------------------------------------------------------------------------------------------------------------------------  Current treatment: Anastrozole with goserelin and Verzenio and Zometa (since August 2022)   Anastrozole toxicities: Tolerating it very well   07/01/2022: Stable bone metastases similar 8 mm left axillary lymph node. 12/31/2022: CT CAP: No significant interval change in pulm metastases.  No evidence of new metastatic disease.  8 mm left axillary lymph node stable (stomach bug was in ED with nausea): Now resolved 01/05/2023: Abdominal pain: CT abdomen: Negative, CT head: Negative Hospitalization 01/24/2023-01/28/2023: diarrhea and fever   Continue with Zometa every 3 months.  Continue Zoladex injections Return to clinic in 3 months with labs, scans and follow-up.    Orders Placed This Encounter  Procedures   CT CHEST ABDOMEN PELVIS W CONTRAST    Standing Status:   Future    Standing Expiration Date:   04/02/2024    Order Specific Question:   If indicated for the ordered procedure, I authorize the administration of contrast media per Radiology protocol    Answer:   Yes    Order Specific Question:   Does the patient have a contrast media/X-ray dye allergy?    Answer:   No    Order Specific Question:   Is patient pregnant?    Answer:   No    Order Specific Question:   Preferred  imaging location?    Answer:   [facility]    Order Specific Question:   Release to patient    Answer:   Immediate    Order Specific Question:   If indicated for the ordered procedure, I authorize the administration of oral contrast media per Radiology protocol    Answer:   Yes   The patient has a good understanding of the overall plan. she agrees with it. she will call with any problems that may develop before the next visit here. Total time spent: 30 mins including face to face time and time spent for planning, charting and co-ordination of care   Tamsen Meek, MD  04/03/23    

## 2023-04-17 ENCOUNTER — Other Ambulatory Visit: Payer: Self-pay

## 2023-04-21 ENCOUNTER — Other Ambulatory Visit: Payer: Self-pay

## 2023-04-23 ENCOUNTER — Other Ambulatory Visit: Payer: Self-pay

## 2023-04-23 NOTE — Progress Notes (Signed)
Specialty Pharmacy Refill Coordination Note  Gabriela Sullivan is a 32 y.o. female contacted today regarding refills of specialty medication(s) Abemaciclib   Patient requested Delivery   Delivery date: 04/30/23   Verified address: [address]  [place]   Medication will be filled on 04/29/23.

## 2023-04-24 ENCOUNTER — Other Ambulatory Visit (HOSPITAL_COMMUNITY): Payer: Self-pay

## 2023-04-24 ENCOUNTER — Other Ambulatory Visit: Payer: Self-pay | Admitting: Hematology and Oncology

## 2023-04-24 ENCOUNTER — Other Ambulatory Visit: Payer: Self-pay

## 2023-04-24 DIAGNOSIS — C50412 Malignant neoplasm of upper-outer quadrant of left female breast: Secondary | ICD-10-CM

## 2023-04-24 MED ORDER — ANASTROZOLE 1 MG PO TABS
1.0000 mg | ORAL_TABLET | Freq: Every day | ORAL | 3 refills | Status: DC
  Filled 2023-04-24: qty 90, 90d supply, fill #0
  Filled 2023-07-15: qty 90, 90d supply, fill #1
  Filled 2023-10-01: qty 90, 90d supply, fill #2
  Filled 2024-01-12: qty 90, 90d supply, fill #3

## 2023-04-28 ENCOUNTER — Inpatient Hospital Stay: Payer: Medicaid Other

## 2023-04-28 ENCOUNTER — Inpatient Hospital Stay: Payer: Medicaid Other | Attending: Oncology

## 2023-04-28 ENCOUNTER — Inpatient Hospital Stay (HOSPITAL_BASED_OUTPATIENT_CLINIC_OR_DEPARTMENT_OTHER): Payer: Medicaid Other | Admitting: Hematology and Oncology

## 2023-04-28 VITALS — BP 130/78 | HR 69 | Temp 97.2°F | Resp 18 | Ht 60.0 in | Wt 169.6 lb

## 2023-04-28 VITALS — BP 110/67 | HR 83 | Temp 97.7°F | Resp 16

## 2023-04-28 DIAGNOSIS — Z170 Estrogen receptor positive status [ER+]: Secondary | ICD-10-CM

## 2023-04-28 DIAGNOSIS — Z5111 Encounter for antineoplastic chemotherapy: Secondary | ICD-10-CM | POA: Insufficient documentation

## 2023-04-28 DIAGNOSIS — C7951 Secondary malignant neoplasm of bone: Secondary | ICD-10-CM | POA: Insufficient documentation

## 2023-04-28 DIAGNOSIS — Z9013 Acquired absence of bilateral breasts and nipples: Secondary | ICD-10-CM | POA: Diagnosis not present

## 2023-04-28 DIAGNOSIS — C7800 Secondary malignant neoplasm of unspecified lung: Secondary | ICD-10-CM | POA: Diagnosis not present

## 2023-04-28 DIAGNOSIS — Z79811 Long term (current) use of aromatase inhibitors: Secondary | ICD-10-CM | POA: Insufficient documentation

## 2023-04-28 DIAGNOSIS — C50412 Malignant neoplasm of upper-outer quadrant of left female breast: Secondary | ICD-10-CM

## 2023-04-28 DIAGNOSIS — Z1721 Progesterone receptor positive status: Secondary | ICD-10-CM | POA: Insufficient documentation

## 2023-04-28 DIAGNOSIS — Z95828 Presence of other vascular implants and grafts: Secondary | ICD-10-CM

## 2023-04-28 LAB — CBC WITH DIFFERENTIAL (CANCER CENTER ONLY)
Abs Immature Granulocytes: 0 10*3/uL (ref 0.00–0.07)
Basophils Absolute: 0 10*3/uL (ref 0.0–0.1)
Basophils Relative: 1 %
Eosinophils Absolute: 0.1 10*3/uL (ref 0.0–0.5)
Eosinophils Relative: 3 %
HCT: 36.4 % (ref 36.0–46.0)
Hemoglobin: 13.1 g/dL (ref 12.0–15.0)
Immature Granulocytes: 0 %
Lymphocytes Relative: 36 %
Lymphs Abs: 1.2 10*3/uL (ref 0.7–4.0)
MCH: 34.4 pg — ABNORMAL HIGH (ref 26.0–34.0)
MCHC: 36 g/dL (ref 30.0–36.0)
MCV: 95.5 fL (ref 80.0–100.0)
Monocytes Absolute: 0.2 10*3/uL (ref 0.1–1.0)
Monocytes Relative: 7 %
Neutro Abs: 1.8 10*3/uL (ref 1.7–7.7)
Neutrophils Relative %: 53 %
Platelet Count: 196 10*3/uL (ref 150–400)
RBC: 3.81 MIL/uL — ABNORMAL LOW (ref 3.87–5.11)
RDW: 11.8 % (ref 11.5–15.5)
WBC Count: 3.3 10*3/uL — ABNORMAL LOW (ref 4.0–10.5)
nRBC: 0 % (ref 0.0–0.2)

## 2023-04-28 LAB — CMP (CANCER CENTER ONLY)
ALT: 16 U/L (ref 0–44)
AST: 24 U/L (ref 15–41)
Albumin: 4.1 g/dL (ref 3.5–5.0)
Alkaline Phosphatase: 37 U/L — ABNORMAL LOW (ref 38–126)
Anion gap: 5 (ref 5–15)
BUN: 17 mg/dL (ref 6–20)
CO2: 27 mmol/L (ref 22–32)
Calcium: 9.2 mg/dL (ref 8.9–10.3)
Chloride: 110 mmol/L (ref 98–111)
Creatinine: 0.66 mg/dL (ref 0.44–1.00)
GFR, Estimated: >60 mL/min (ref >60)
Glucose, Bld: 95 mg/dL (ref 70–99)
Potassium: 3.9 mmol/L (ref 3.5–5.1)
Sodium: 142 mmol/L (ref 135–145)
Total Bilirubin: 0.5 mg/dL (ref <1.2)
Total Protein: 7.2 g/dL (ref 6.5–8.1)

## 2023-04-28 MED ORDER — GOSERELIN ACETATE 3.6 MG ~~LOC~~ IMPL
3.6000 mg | DRUG_IMPLANT | Freq: Once | SUBCUTANEOUS | Status: AC
Start: 2023-04-28 — End: 2023-04-28
  Administered 2023-04-28: 3.6 mg via SUBCUTANEOUS
  Filled 2023-04-28: qty 3.6

## 2023-04-28 NOTE — Progress Notes (Signed)
Patient Care Team: Leane Call, MD as PCP - General (Family Medicine) [name], RN as Oncology Nurse Navigator Pershing Proud, RN as Oncology Nurse Navigator Harriette Bouillon, MD as Consulting Physician (General Surgery) Dorothy Puffer, MD as Consulting Physician (Radiation Oncology) [name], MD as Consulting Physician (Hematology and Oncology)  DIAGNOSIS:  Encounter Diagnosis  Name Primary?   Malignant neoplasm of upper-outer quadrant of left breast in female, estrogen receptor positive (HCC) Yes    SUMMARY OF ONCOLOGIC HISTORY: Oncology History  Ductal carcinoma in situ (DCIS) of right breast  09/29/2020 Initial Diagnosis   Ductal carcinoma in situ (DCIS) of right breast   10/04/2020 Cancer Staging   Staging form: Breast, AJCC 8th Edition - Clinical: Stage 0 (cTis (DCIS), cN0, cM0) - Signed by Lowella Dell, MD on 10/04/2020   11/25/2020 Genetic Testing   Negative genetic testing:  No pathogenic variants detected on the [employer] CancerNext-Expanded + RNAinsight panel. The report date is 11/25/2020.   The CancerNext-Expanded + RNAinsight gene panel offered by [employer] and includes sequencing and rearrangement analysis for the following 77 genes: AIP, ALK, APC, ATM, AXIN2, BAP1, BARD1, BLM, BMPR1A, BRCA1, BRCA2, BRIP1, CDC73, CDH1, CDK4, CDKN1B, CDKN2A, CHEK2, CTNNA1, DICER1, FANCC, FH, FLCN, GALNT12, KIF1B, LZTR1, MAX, MEN1, MET, MLH1, MSH2, MSH3, MSH6, MUTYH, NBN, NF1, NF2, NTHL1, PALB2, PHOX2B, PMS2, POT1, PRKAR1A, PTCH1, PTEN, RAD51C, RAD51D, RB1, RECQL, RET, SDHA, SDHAF2, SDHB, SDHC, SDHD, SMAD4, SMARCA4, SMARCB1, SMARCE1, STK11, SUFU, TMEM127, TP53, TSC1, TSC2, VHL and XRCC2 (sequencing and deletion/duplication); EGFR, EGLN1, HOXB13, KIT, MITF, PDGFRA, POLD1 and POLE (sequencing only); EPCAM and GREM1 (deletion/duplication only). RNA data is routinely analyzed for use in variant interpretation for all genes.   Malignant neoplasm of upper-outer quadrant of  left breast in female, estrogen receptor positive (HCC)  09/29/2020 Initial Diagnosis   Malignant neoplasm of upper-outer quadrant of left breast in female, estrogen receptor positive (HCC)   10/01/2020 Relapse/Recurrence   Spine MRI: Scattered thoracic and lumbar vertebral body and posterior element metastases   10/25/2020 - 11/10/2020 Chemotherapy   2 cycles of Adriamycin and Cytoxan.  Chemo discontinued because of rising LFTs.       11/25/2020 Genetic Testing   Negative genetic testing:  No pathogenic variants detected on the [employer] CancerNext-Expanded + RNAinsight panel. The report date is 11/25/2020.   The CancerNext-Expanded + RNAinsight gene panel offered by [employer] and includes sequencing and rearrangement analysis for the following 77 genes: AIP, ALK, APC, ATM, AXIN2, BAP1, BARD1, BLM, BMPR1A, BRCA1, BRCA2, BRIP1, CDC73, CDH1, CDK4, CDKN1B, CDKN2A, CHEK2, CTNNA1, DICER1, FANCC, FH, FLCN, GALNT12, KIF1B, LZTR1, MAX, MEN1, MET, MLH1, MSH2, MSH3, MSH6, MUTYH, NBN, NF1, NF2, NTHL1, PALB2, PHOX2B, PMS2, POT1, PRKAR1A, PTCH1, PTEN, RAD51C, RAD51D, RB1, RECQL, RET, SDHA, SDHAF2, SDHB, SDHC, SDHD, SMAD4, SMARCA4, SMARCB1, SMARCE1, STK11, SUFU, TMEM127, TP53, TSC1, TSC2, VHL and XRCC2 (sequencing and deletion/duplication); EGFR, EGLN1, HOXB13, KIT, MITF, PDGFRA, POLD1 and POLE (sequencing only); EPCAM and GREM1 (deletion/duplication only). RNA data is routinely analyzed for use in variant interpretation for all genes.    Anti-estrogen oral therapy   anastrozole started 12/06/2020 goserelin started 10/11/2020  Verzinio started Aug 2022 zolendronate started 11/02/2018. Every 12 weeks.   01/31/2021 Surgery   Left mastectomy: Residual multifocal IDC 2 cm with intermediate grade DCIS, 15/15 lymph nodes positive, margins negative, lymphovascular space invasion present, ER 60%, PR 10%, HER2 negative, Ki-67 15% Right mastectomy: Fibrocystic change no residual cancer or DCIS     CHIEF COMPLIANT:  Follow-up of  metastatic breast cancer on Verzinio and letrozole with Zoladex  HISTORY OF PRESENT ILLNESS:   History of Present Illness   The patient, with a history of an Met breast cancer, presents for a follow-up visit on treatment with Verzenio, Zoladex. She had experienced diarrhea, pain, and fevers, which have since resolved. She underwent scans during her hospital stay, and the next scan is planned for January. She has no significant plans for the upcoming holiday season.  In addition, the patient's mother was recently diagnosed with tuberculosis (TB). Gabriela Sullivan is asymptomatic and had several scans previously..         ALLERGIES:  has No Known Allergies.  MEDICATIONS:  Current Outpatient Medications  Medication Sig Dispense Refill   abemaciclib (VERZENIO) 100 MG tablet Take 1 tablet (100 mg total) by mouth 2 (two) times daily. Swallow tablets whole. Do not chew, crush, or split tablets before swallowing. 56 tablet 3   anastrozole (ARIMIDEX) 1 MG tablet Take 1 tablet (1 mg total) by mouth daily. 90 tablet 3   calcium carbonate (TUMS - DOSED IN MG ELEMENTAL CALCIUM) 500 MG chewable tablet Chew 2 tablets by mouth every other day.     ondansetron (ZOFRAN-ODT) 4 MG disintegrating tablet Take 1 tablet (4 mg total) by mouth every 8 (eight) hours as needed for nausea or vomiting. 8 tablet 0   potassium chloride (KLOR-CON) 10 MEQ tablet Take 1 tablet (10 mEq total) by mouth daily. 20 tablet 0   Vitamin D, Ergocalciferol, (DRISDOL) 1.25 MG (50000 UNIT) CAPS capsule Take 50,000 Units by mouth once a week.     No current facility-administered medications for this visit.   Facility-Administered Medications Ordered in Other Visits  Medication Dose Route Frequency Provider Last Rate Last Admin   goserelin (ZOLADEX) injection 3.6 mg  3.6 mg Subcutaneous Once [name], MD        PHYSICAL EXAMINATION: ECOG PERFORMANCE STATUS: 1 - Symptomatic but completely ambulatory  Vitals:   04/28/23 0800   BP: 130/78  Pulse: 69  Resp: 18  Temp: (!) 97.2 F (36.2 C)  SpO2: 100%   Filed Weights   04/28/23 0800  Weight: 169 lb 9.6 oz (76.9 kg)      LABORATORY DATA:  I have reviewed the data as listed    Latest Ref Rng & Units 04/03/2023    8:14 AM 02/27/2023    7:48 AM 01/28/2023   10:40 AM  CMP  Glucose 70 - 99 mg/dL 86  94  86   BUN 6 - 20 mg/dL 20  17  18    Creatinine 0.44 - 1.00 mg/dL 1.61  0.96  0.45   Sodium 135 - 145 mmol/L 141  142  138   Potassium 3.5 - 5.1 mmol/L 3.5  3.7  3.7   Chloride 98 - 111 mmol/L 107  109  109   CO2 22 - 32 mmol/L 27  27  23    Calcium 8.9 - 10.3 mg/dL 9.5  9.4  8.8   Total Protein 6.5 - 8.1 g/dL 7.7  7.6    Total Bilirubin 0.3 - 1.2 mg/dL 0.8  0.7    Alkaline Phos 38 - 126 U/L 45  52    AST 15 - 41 U/L 22  43    ALT 0 - 44 U/L 17  57      Lab Results  Component Value Date   WBC 3.3 (L) 04/28/2023   HGB 13.1 04/28/2023   HCT 36.4 04/28/2023   MCV 95.5 04/28/2023  PLT 196 04/28/2023   NEUTROABS 1.8 04/28/2023    ASSESSMENT & PLAN:  Malignant neoplasm of upper-outer quadrant of left breast in female, estrogen receptor positive [facility]) 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% 09/25/2020: Right breast biopsy: DCIS grade 1, ER/PR positive 10/06/2020  breast MRI showed a 11 cm area of malignancy, with a second 1.9 cm mass, and skin thickening concerning for inflammatory breast carcinoma; at least 6 abnormal lymph nodes noted 10/13/2020 CT chest shows clear lungs and no obvious liver involvement but multiple lytic bone lesions in addition to the findings and MRI above 10/21/2020 spine MRI: Shows widely scattered thoracic and lumbar vertebral body metastases 10/24/2020: 2 cycles of dose dense Adriamycin and Cytoxan discontinued because of rising LFTs 12/01/2020: MRI abdomen: No evidence of metastatic disease in the liver 01/31/2021:Left mastectomy: Residual multifocal IDC  2 cm with intermediate grade DCIS, 15/15 lymph nodes positive, margins negative, lymphovascular space invasion present, ER 60%, PR 10%, HER2 negative, Ki-67 15% Right mastectomy: Fibrocystic change no residual cancer or DCIS -------------------------------------------------------------------------------------------------------------------------------------------  Current treatment: Anastrozole with goserelin and Verzenio and Zometa (since August 2022)   Anastrozole toxicities: Tolerating it very well   07/01/2022: Stable bone metastases similar 8 mm left axillary lymph node. 12/31/2022: CT CAP: No significant interval change in pulm metastases.  No evidence of new metastatic disease.  8 mm left axillary lymph node stable (stomach bug was in ED with nausea): Now resolved 01/05/2023: Abdominal pain: CT abdomen: Negative, CT head: Negative Hospitalization 01/24/2023-01/28/2023: diarrhea and fever   Continue with Zometa every 3 months.  Continue Zoladex injections   Assessment and Plan    Cancer (type unspecified) Stable with no new symptoms. Last scan was in August, and next scan is due in January. -Schedule CT scan before June 23, 2023.  Tuberculosis exposure Patient's mother diagnosed with TB. Patient has no symptoms of TB and no known positive TB test. -No immediate action needed.   General Health Maintenance -Continue current injection regimen. -Infusion of Zometa due next month.     No orders of the defined types were placed in this encounter.  The patient has a good understanding of the overall plan. she agrees with it. she will call with any problems that may develop before the next visit here. Total time spent: 30 mins including face to face time and time spent for planning, charting and co-ordination of care   Tamsen Meek, MD 04/28/23

## 2023-04-28 NOTE — Assessment & Plan Note (Signed)
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% 09/25/2020: Right breast biopsy: DCIS grade 1, ER/PR positive 10/06/2020  breast MRI showed a 11 cm area of malignancy, with a second 1.9 cm mass, and skin thickening concerning for inflammatory breast carcinoma; at least 6 abnormal lymph nodes noted 10/13/2020 CT chest shows clear lungs and no obvious liver involvement but multiple lytic bone lesions in addition to the findings and MRI above 10/21/2020 spine MRI: Shows widely scattered thoracic and lumbar vertebral body metastases 10/24/2020: 2 cycles of dose dense Adriamycin and Cytoxan discontinued because of rising LFTs 12/01/2020: MRI abdomen: No evidence of metastatic disease in the liver 01/31/2021:Left mastectomy: Residual multifocal IDC 2 cm with intermediate grade DCIS, 15/15 lymph nodes positive, margins negative, lymphovascular space invasion present, ER 60%, PR 10%, HER2 negative, Ki-67 15% Right mastectomy: Fibrocystic change no residual cancer or DCIS -------------------------------------------------------------------------------------------------------------------------------------------  Current treatment: Anastrozole with goserelin and Verzenio and Zometa (since August 2022)   Anastrozole toxicities: Tolerating it very well   07/01/2022: Stable bone metastases similar 8 mm left axillary lymph node. 12/31/2022: CT CAP: No significant interval change in pulm metastases.  No evidence of new metastatic disease.  8 mm left axillary lymph node stable (stomach bug was in ED with nausea): Now resolved 01/05/2023: Abdominal pain: CT abdomen: Negative, CT head: Negative Hospitalization 01/24/2023-01/28/2023: diarrhea and fever   Continue with Zometa every 3 months.  Continue Zoladex injections Return to clinic in 1 month

## 2023-04-29 ENCOUNTER — Other Ambulatory Visit (HOSPITAL_COMMUNITY): Payer: Self-pay

## 2023-04-29 ENCOUNTER — Other Ambulatory Visit: Payer: Self-pay

## 2023-05-02 ENCOUNTER — Inpatient Hospital Stay: Payer: Medicaid Other

## 2023-05-19 ENCOUNTER — Other Ambulatory Visit: Payer: Self-pay | Admitting: Hematology and Oncology

## 2023-05-19 ENCOUNTER — Other Ambulatory Visit: Payer: Self-pay

## 2023-05-19 NOTE — Progress Notes (Signed)
Specialty Pharmacy Ongoing Clinical Assessment Note  Gabriela Sullivan is a 32 y.o. female who is being followed by the specialty pharmacy service for RxSp Oncology   Patient's specialty medication(s) reviewed today: Abemaciclib (VERZENIO)   Missed doses in the last 4 weeks: 0   Patient/Caregiver did not have any additional questions or concerns.   Therapeutic benefit summary: Patient is achieving benefit   Adverse events/side effects summary: No adverse events/side effects   Patient's therapy is appropriate to: Continue    Goals Addressed             This Visit's Progress    Slow Disease Progression       Patient is on track. Patient will maintain adherence. No sign of progression on August 2024 CT, next scan due in January.          Follow up:  6 months  Otto Herb Specialty Pharmacist

## 2023-05-19 NOTE — Progress Notes (Signed)
Specialty Pharmacy Refill Coordination Note  Gabriela Sullivan is a 32 y.o. female contacted today regarding refills of specialty medication(s) Abemaciclib Gabriela Sullivan)   Patient requested Delivery   Delivery date: 05/23/23   Verified address: [address] [place] [address]   Medication will be filled on 05/22/23.

## 2023-05-20 ENCOUNTER — Other Ambulatory Visit (HOSPITAL_COMMUNITY): Payer: Self-pay

## 2023-05-20 MED ORDER — ABEMACICLIB 100 MG PO TABS
100.0000 mg | ORAL_TABLET | Freq: Two times a day (BID) | ORAL | 3 refills | Status: DC
  Filled 2023-05-20: qty 56, 28d supply, fill #0
  Filled 2023-06-11: qty 56, 28d supply, fill #1
  Filled 2023-07-15: qty 56, 28d supply, fill #2
  Filled 2023-08-11: qty 56, 28d supply, fill #3

## 2023-05-26 ENCOUNTER — Inpatient Hospital Stay: Payer: Medicaid Other

## 2023-05-26 ENCOUNTER — Inpatient Hospital Stay: Payer: Medicaid Other | Attending: Oncology | Admitting: Hematology and Oncology

## 2023-05-26 VITALS — BP 125/73 | HR 90 | Temp 97.2°F | Resp 18 | Ht 60.0 in | Wt 165.8 lb

## 2023-05-26 DIAGNOSIS — C50412 Malignant neoplasm of upper-outer quadrant of left female breast: Secondary | ICD-10-CM | POA: Insufficient documentation

## 2023-05-26 DIAGNOSIS — Z1721 Progesterone receptor positive status: Secondary | ICD-10-CM | POA: Insufficient documentation

## 2023-05-26 DIAGNOSIS — Z170 Estrogen receptor positive status [ER+]: Secondary | ICD-10-CM

## 2023-05-26 DIAGNOSIS — Z1732 Human epidermal growth factor receptor 2 negative status: Secondary | ICD-10-CM | POA: Insufficient documentation

## 2023-05-26 DIAGNOSIS — R634 Abnormal weight loss: Secondary | ICD-10-CM | POA: Insufficient documentation

## 2023-05-26 DIAGNOSIS — Z79811 Long term (current) use of aromatase inhibitors: Secondary | ICD-10-CM | POA: Diagnosis not present

## 2023-05-26 DIAGNOSIS — Z95828 Presence of other vascular implants and grafts: Secondary | ICD-10-CM

## 2023-05-26 DIAGNOSIS — M549 Dorsalgia, unspecified: Secondary | ICD-10-CM | POA: Diagnosis not present

## 2023-05-26 DIAGNOSIS — Z5111 Encounter for antineoplastic chemotherapy: Secondary | ICD-10-CM | POA: Insufficient documentation

## 2023-05-26 DIAGNOSIS — Z9013 Acquired absence of bilateral breasts and nipples: Secondary | ICD-10-CM | POA: Insufficient documentation

## 2023-05-26 DIAGNOSIS — C7951 Secondary malignant neoplasm of bone: Secondary | ICD-10-CM | POA: Diagnosis not present

## 2023-05-26 DIAGNOSIS — C7800 Secondary malignant neoplasm of unspecified lung: Secondary | ICD-10-CM | POA: Diagnosis not present

## 2023-05-26 LAB — CBC WITH DIFFERENTIAL (CANCER CENTER ONLY)
Abs Immature Granulocytes: 0.01 10*3/uL (ref 0.00–0.07)
Basophils Absolute: 0.1 10*3/uL (ref 0.0–0.1)
Basophils Relative: 2 %
Eosinophils Absolute: 0.1 10*3/uL (ref 0.0–0.5)
Eosinophils Relative: 2 %
HCT: 32 % — ABNORMAL LOW (ref 36.0–46.0)
Hemoglobin: 11.7 g/dL — ABNORMAL LOW (ref 12.0–15.0)
Immature Granulocytes: 0 %
Lymphocytes Relative: 38 %
Lymphs Abs: 1.2 10*3/uL (ref 0.7–4.0)
MCH: 34.3 pg — ABNORMAL HIGH (ref 26.0–34.0)
MCHC: 36.6 g/dL — ABNORMAL HIGH (ref 30.0–36.0)
MCV: 93.8 fL (ref 80.0–100.0)
Monocytes Absolute: 0.2 10*3/uL (ref 0.1–1.0)
Monocytes Relative: 7 %
Neutro Abs: 1.6 10*3/uL — ABNORMAL LOW (ref 1.7–7.7)
Neutrophils Relative %: 51 %
Platelet Count: 183 10*3/uL (ref 150–400)
RBC: 3.41 MIL/uL — ABNORMAL LOW (ref 3.87–5.11)
RDW: 12.7 % (ref 11.5–15.5)
WBC Count: 3.1 10*3/uL — ABNORMAL LOW (ref 4.0–10.5)
nRBC: 0 % (ref 0.0–0.2)

## 2023-05-26 LAB — CMP (CANCER CENTER ONLY)
ALT: 16 U/L (ref 0–44)
AST: 23 U/L (ref 15–41)
Albumin: 4.1 g/dL (ref 3.5–5.0)
Alkaline Phosphatase: 37 U/L — ABNORMAL LOW (ref 38–126)
Anion gap: 4 — ABNORMAL LOW (ref 5–15)
BUN: 14 mg/dL (ref 6–20)
CO2: 29 mmol/L (ref 22–32)
Calcium: 9.1 mg/dL (ref 8.9–10.3)
Chloride: 110 mmol/L (ref 98–111)
Creatinine: 0.65 mg/dL (ref 0.44–1.00)
GFR, Estimated: >60 mL/min (ref >60)
Glucose, Bld: 96 mg/dL (ref 70–99)
Potassium: 3.4 mmol/L — ABNORMAL LOW (ref 3.5–5.1)
Sodium: 143 mmol/L (ref 135–145)
Total Bilirubin: 0.5 mg/dL (ref <1.2)
Total Protein: 6.6 g/dL (ref 6.5–8.1)

## 2023-05-26 MED ORDER — ZOLEDRONIC ACID 4 MG/100ML IV SOLN
4.0000 mg | Freq: Once | INTRAVENOUS | Status: AC
Start: 2023-05-26 — End: 2023-05-26
  Administered 2023-05-26: 4 mg via INTRAVENOUS
  Filled 2023-05-26: qty 100

## 2023-05-26 MED ORDER — SODIUM CHLORIDE 0.9 % IV SOLN: Freq: Once | INTRAVENOUS | Status: AC

## 2023-05-26 MED ORDER — HEPARIN SOD (PORK) LOCK FLUSH 100 UNIT/ML IV SOLN
250.0000 [IU] | Freq: Once | INTRAVENOUS | Status: AC | PRN
  Administered 2023-05-26: 500 [IU]

## 2023-05-26 MED ORDER — GOSERELIN ACETATE 3.6 MG ~~LOC~~ IMPL
3.6000 mg | DRUG_IMPLANT | Freq: Once | SUBCUTANEOUS | Status: AC
  Administered 2023-05-26: 3.6 mg via SUBCUTANEOUS
  Filled 2023-05-26: qty 3.6

## 2023-05-26 MED ORDER — SODIUM CHLORIDE 0.9% FLUSH
3.0000 mL | Freq: Once | INTRAVENOUS | Status: AC | PRN
  Administered 2023-05-26: 10 mL

## 2023-05-26 MED ORDER — SODIUM CHLORIDE 0.9% FLUSH
10.0000 mL | Freq: Once | INTRAVENOUS | Status: AC | PRN
  Administered 2023-05-26: 10 mL

## 2023-05-26 NOTE — Progress Notes (Signed)
Patient Care Team: Leane Call, MD as PCP - General (Family Medicine) [name], RN as Oncology Nurse Navigator Pershing Proud, RN as Oncology Nurse Navigator Harriette Bouillon, MD as Consulting Physician (General Surgery) Dorothy Puffer, MD as Consulting Physician (Radiation Oncology) [name], MD as Consulting Physician (Hematology and Oncology)  DIAGNOSIS:  Encounter Diagnoses  Name Primary?   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 The report date is 11/25/2020.   The CancerNext-Expanded + RNAinsight gene panel offered by [employer] and includes sequencing and rearrangement analysis for the following 77 genes: AIP, ALK, APC, ATM, AXIN2, BAP1, BARD1, BLM, BMPR1A, BRCA1, BRCA2, BRIP1, CDC73, CDH1, CDK4, CDKN1B, CDKN2A, CHEK2, CTNNA1, DICER1, FANCC, FH, FLCN, GALNT12, KIF1B, LZTR1, MAX, MEN1, MET, MLH1, MSH2, MSH3, MSH6, MUTYH, NBN, NF1, NF2, NTHL1, PALB2, PHOX2B, PMS2, POT1, PRKAR1A, PTCH1, PTEN, RAD51C, RAD51D, RB1, RECQL, RET, SDHA, SDHAF2, SDHB, SDHC, SDHD, SMAD4, SMARCA4, SMARCB1, SMARCE1, STK11, SUFU, TMEM127, TP53, TSC1, TSC2, VHL and XRCC2 (sequencing and deletion/duplication); EGFR, EGLN1, HOXB13, KIT, MITF, PDGFRA, POLD1 and POLE (sequencing only); EPCAM and GREM1 (deletion/duplication only). RNA data is routinely analyzed for use in variant interpretation for all genes.    Malignant neoplasm of upper-outer quadrant of left breast in female, estrogen receptor positive (HCC)  09/29/2020 Initial Diagnosis   Malignant neoplasm of upper-outer quadrant of left breast in female, estrogen receptor positive (HCC)   10/01/2020 Relapse/Recurrence   Spine MRI: Scattered thoracic and lumbar vertebral body and posterior element metastases   10/25/2020 - 11/10/2020 Chemotherapy   2 cycles of Adriamycin and Cytoxan.  Chemo discontinued because of rising LFTs.       11/25/2020 Genetic Testing   Negative genetic testing:  No pathogenic variants detected on the [employer] CancerNext-Expanded + RNAinsight panel. The report date is 11/25/2020.   The CancerNext-Expanded + RNAinsight gene panel offered by [employer] and includes sequencing and rearrangement analysis for the following 77 genes: AIP, ALK, APC, ATM, AXIN2, BAP1, BARD1, BLM, BMPR1A, BRCA1, BRCA2, BRIP1, CDC73, CDH1, CDK4, CDKN1B, CDKN2A, CHEK2, CTNNA1, DICER1, FANCC, FH, FLCN, GALNT12, KIF1B, LZTR1, MAX, MEN1, MET, MLH1, MSH2, MSH3, MSH6, MUTYH, NBN, NF1, NF2, NTHL1, PALB2, PHOX2B, PMS2, POT1, PRKAR1A, PTCH1, PTEN, RAD51C, RAD51D, RB1, RECQL, RET, SDHA, SDHAF2, SDHB, SDHC, SDHD, SMAD4, SMARCA4, SMARCB1, SMARCE1, STK11, SUFU, TMEM127, TP53, TSC1, TSC2, VHL and XRCC2 (sequencing and deletion/duplication); EGFR, EGLN1, HOXB13, KIT, MITF, PDGFRA, POLD1 and POLE (sequencing only); EPCAM and GREM1 (deletion/duplication only). RNA data is routinely analyzed for use in variant interpretation for all genes.    Anti-estrogen oral therapy   anastrozole started 12/06/2020 goserelin started 10/11/2020  Verzinio started Aug 2022 zolendronate started 11/02/2018. Every 12 weeks.   01/31/2021 Surgery   Left mastectomy: Residual multifocal IDC 2 cm with intermediate grade DCIS, 15/15 lymph nodes positive, margins negative, lymphovascular space invasion present, ER 60%, PR 10%, HER2 negative, Ki-67 15% Right mastectomy: Fibrocystic change no  residual cancer or  DCIS     CHIEF COMPLIANT: Follow-up of metastatic breast cancer on Ibrance, Zoladex, anastrozole and Zometa  HISTORY OF PRESENT ILLNESS:   History of Present Illness   The patient, with a history of met breast cancer, presents for a routine follow-up. She reports intermittent back pain, which has improved and is not as severe as before. She is currently on Zolodex, [place], and receives bone infusions every three months. She has also been taking Mounjaro for weight loss and has successfully lost weight, currently weighing 165 lbs. She attributes her weight loss to a low carbohydrate diet and the use of Mounjaro. She denies any new complaints or concerns.         ALLERGIES:  has no known allergies.  MEDICATIONS:  Current Outpatient Medications  Medication Sig Dispense Refill   abemaciclib (VERZENIO) 100 MG tablet Take 1 tablet (100 mg total) by mouth 2 (two) times daily. Swallow tablets whole. Do not chew, crush, or split tablets before swallowing. 56 tablet 3   anastrozole (ARIMIDEX) 1 MG tablet Take 1 tablet (1 mg total) by mouth daily. 90 tablet 3   calcium carbonate (TUMS - DOSED IN MG ELEMENTAL CALCIUM) 500 MG chewable tablet Chew 2 tablets by mouth every other day.     ondansetron (ZOFRAN-ODT) 4 MG disintegrating tablet Take 1 tablet (4 mg total) by mouth every 8 (eight) hours as needed for nausea or vomiting. 8 tablet 0   potassium chloride (KLOR-CON) 10 MEQ tablet Take 1 tablet (10 mEq total) by mouth daily. 20 tablet 0   Vitamin D, Ergocalciferol, (DRISDOL) 1.25 MG (50000 UNIT) CAPS capsule Take 50,000 Units by mouth once a week.     No current facility-administered medications for this visit.    PHYSICAL EXAMINATION: ECOG PERFORMANCE STATUS: 1 - Symptomatic but completely ambulatory  Vitals:   05/26/23 0812  BP: 125/73  Pulse: 90  Resp: 18  Temp: (!) 97.2 F (36.2 C)  SpO2: 100%   Filed Weights   05/26/23 0812  Weight: 165 lb 12.8 oz (75.2 kg)     Physical Exam   MEASUREMENTS: WT- 165        LABORATORY DATA:  I have reviewed the data as listed    Latest Ref Rng & Units 04/28/2023    7:30 AM 04/03/2023    8:14 AM 02/27/2023    7:48 AM  CMP  Glucose 70 - 99 mg/dL 95  86  94   BUN 6 - 20 mg/dL 17  20  17    Creatinine 0.44 - 1.00 mg/dL 0.45  4.09  8.11   Sodium 135 - 145 mmol/L 142  141  142   Potassium 3.5 - 5.1 mmol/L 3.9  3.5  3.7   Chloride 98 - 111 mmol/L 110  107  109   CO2 22 - 32 mmol/L 27  27  27    Calcium 8.9 - 10.3 mg/dL 9.2  9.5  9.4   Total Protein 6.5 - 8.1 g/dL 7.2  7.7  7.6   Total Bilirubin <1.2 mg/dL 0.5  0.8  0.7   Alkaline Phos 38 - 126 U/L 37  45  52   AST 15 - 41 U/L 24  22  43   ALT 0 - 44 U/L 16  17  57     Lab Results  Component Value Date   WBC 3.3 (L) 04/28/2023   HGB 13.1 04/28/2023   HCT 36.4 04/28/2023   MCV 95.5 04/28/2023   PLT 196 04/28/2023  NEUTROABS 1.8 04/28/2023    ASSESSMENT & PLAN:  Malignant neoplasm of upper-outer quadrant of left breast in female, estrogen receptor positive [facility]) 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% 09/25/2020: Right breast biopsy: DCIS grade 1, ER/PR positive 10/06/2020  breast MRI showed a 11 cm area of malignancy, with a second 1.9 cm mass, and skin thickening concerning for inflammatory breast carcinoma; at least 6 abnormal lymph nodes noted 10/13/2020 CT chest shows clear lungs and no obvious liver involvement but multiple lytic bone lesions in addition to the findings and MRI above 10/21/2020 spine MRI: Shows widely scattered thoracic and lumbar vertebral body metastases 10/24/2020: 2 cycles of dose dense Adriamycin and Cytoxan discontinued because of rising LFTs 12/01/2020: MRI abdomen: No evidence of metastatic disease in the liver 01/31/2021:Left mastectomy: Residual multifocal IDC 2 cm with intermediate grade DCIS, 15/15 lymph nodes positive, margins negative,  lymphovascular space invasion present, ER 60%, PR 10%, HER2 negative, Ki-67 15% Right mastectomy: Fibrocystic change no residual cancer or DCIS -------------------------------------------------------------------------------------------------------------------------------------------  Current treatment: Anastrozole with goserelin and Verzenio and Zometa (since August 2022)   Anastrozole toxicities: Tolerating it very well   07/01/2022: Stable bone metastases similar 8 mm left axillary lymph node. 12/31/2022: CT CAP: No significant interval change in pulm metastases.  No evidence of new metastatic disease.  8 mm left axillary lymph node stable (stomach bug was in ED with nausea): Now resolved 01/05/2023: Abdominal pain: CT abdomen: Negative, CT head: Negative Hospitalization 01/24/2023-01/28/2023: diarrhea and fever   Next scans have been ordered for 07/04/2023 Continue with Zometa every 3 months.  Continue Zoladex injections ------------------------------------- Assessment and Plan    Cancer (unspecified type) Patient is on Ibrance and Rolodex, with bone infusions every three months. No new complaints related to treatment. -Continue current treatment regimen. -Schedule scans for end of January 2025.  Weight loss Patient has lost weight, currently at 165 lbs. Weight loss is intentional, achieved through diet changes and use of Mounjaro. -Continue current weight management strategy.  Back pain Intermittent, but improved from previous state. -Monitor and manage as needed.  General Health Maintenance -Complete blood work today. -Infusion scheduled for today. -Follow-up appointment on 27th January 2025.          No orders of the defined types were placed in this encounter.  The patient has a good understanding of the overall plan. she agrees with it. she will call with any problems that may develop before the next visit here. Total time spent: 30 mins including face to face time and  time spent for planning, charting and co-ordination of care   Tamsen Meek, MD 05/26/23

## 2023-05-26 NOTE — Patient Instructions (Signed)
 Zoledronic Acid Injection (Cancer) What is this medication? ZOLEDRONIC ACID (ZOE le dron ik AS id) treats high calcium levels in the blood caused by cancer. It may also be used with chemotherapy to treat weakened bones caused by cancer. It works by slowing down the release of calcium from bones. This lowers calcium levels in your blood. It also makes your bones stronger and less likely to break (fracture). It belongs to a group of medications called bisphosphonates. This medicine may be used for other purposes; ask your health care provider or pharmacist if you have questions. COMMON BRAND NAME(S): Zometa, Zometa Powder What should I tell my care team before I take this medication? They need to know if you have any of these conditions: Dehydration Dental disease Kidney disease Liver disease Low levels of calcium in the blood Lung or breathing disease, such as asthma Receiving steroids, such as dexamethasone or prednisone An unusual or allergic reaction to zoledronic acid, other medications, foods, dyes, or preservatives Pregnant or trying to get pregnant Breast-feeding How should I use this medication? This medication is injected into a vein. It is given by your care team in a hospital or clinic setting. Talk to your care team about the use of this medication in children. Special care may be needed. Overdosage: If you think you have taken too much of this medicine contact a poison control center or emergency room at once. NOTE: This medicine is only for you. Do not share this medicine with others. What if I miss a dose? Keep appointments for follow-up doses. It is important not to miss your dose. Call your care team if you are unable to keep an appointment. What may interact with this medication? Certain antibiotics given by injection Diuretics, such as bumetanide, furosemide NSAIDs, medications for pain and inflammation, such as ibuprofen or naproxen Teriparatide Thalidomide This list  may not describe all possible interactions. Give your health care provider a list of all the medicines, herbs, non-prescription drugs, or dietary supplements you use. Also tell them if you smoke, drink alcohol, or use illegal drugs. Some items may interact with your medicine. What should I watch for while using this medication? Visit your care team for regular checks on your progress. It may be some time before you see the benefit from this medication. Some people who take this medication have severe bone, joint, or muscle pain. This medication may also increase your risk for jaw problems or a broken thigh bone. Tell your care team right away if you have severe pain in your jaw, bones, joints, or muscles. Tell you care team if you have any pain that does not go away or that gets worse. Tell your dentist and dental surgeon that you are taking this medication. You should not have major dental surgery while on this medication. See your dentist to have a dental exam and fix any dental problems before starting this medication. Take good care of your teeth while on this medication. Make sure you see your dentist for regular follow-up appointments. You should make sure you get enough calcium and vitamin D while you are taking this medication. Discuss the foods you eat and the vitamins you take with your care team. Check with your care team if you have severe diarrhea, nausea, and vomiting, or if you sweat a lot. The loss of too much body fluid may make it dangerous for you to take this medication. You may need bloodwork while taking this medication. Talk to your care team if  you wish to become pregnant or think you might be pregnant. This medication can cause serious birth defects. What side effects may I notice from receiving this medication? Side effects that you should report to your care team as soon as possible: Allergic reactions--skin rash, itching, hives, swelling of the face, lips, tongue, or  throat Kidney injury--decrease in the amount of urine, swelling of the ankles, hands, or feet Low calcium level--muscle pain or cramps, confusion, tingling, or numbness in the hands or feet Osteonecrosis of the jaw--pain, swelling, or redness in the mouth, numbness of the jaw, poor healing after dental work, unusual discharge from the mouth, visible bones in the mouth Severe bone, joint, or muscle pain Side effects that usually do not require medical attention (report to your care team if they continue or are bothersome): Constipation Fatigue Fever Loss of appetite Nausea Stomach pain This list may not describe all possible side effects. Call your doctor for medical advice about side effects. You may report side effects to FDA at 1-800-FDA-1088. Where should I keep my medication? This medication is given in a hospital or clinic. It will not be stored at home. NOTE: This sheet is a summary. It may not cover all possible information. If you have questions about this medicine, talk to your doctor, pharmacist, or health care provider.  2024 Elsevier/Gold Standard (2021-07-13 00:00:00)  Goserelin Implant What is this medication? GOSERELIN (GOE se rel in) treats prostate cancer and breast cancer. It works by decreasing levels of the hormones testosterone and estrogen in the body. This prevents prostate and breast cancer cells from spreading or growing. It may also be used to treat endometriosis. This is a condition where the tissue that lines the uterus grows outside the uterus. It works by decreasing the amount of estrogen your body makes, which reduces heavy bleeding and pain. It can also be used to help thin the lining of the uterus before a surgery used to prevent or reduce heavy periods. This medicine may be used for other purposes; ask your health care provider or pharmacist if you have questions. COMMON BRAND NAME(S): Zoladex, Zoladex 48-Month What should I tell my care team before I take  this medication? They need to know if you have any of these conditions: Bone problems Diabetes Heart disease History of irregular heartbeat or rhythm An unusual or allergic reaction to goserelin, other medications, foods, dyes, or preservatives Pregnant or trying to get pregnant Breastfeeding How should I use this medication? This medication is injected under the skin. It is given by your care team in a hospital or clinic setting. Talk to your care team about the use of this medication in children. Special care may be needed. Overdosage: If you think you have taken too much of this medicine contact a poison control center or emergency room at once. NOTE: This medicine is only for you. Do not share this medicine with others. What if I miss a dose? Keep appointments for follow-up doses. It is important not to miss your dose. Call your care team if you are unable to keep an appointment. What may interact with this medication? Do not take this medication with any of the following: Cisapride Dronedarone Pimozide Thioridazine This medication may also interact with the following: Other medications that cause heart rhythm changes This list may not describe all possible interactions. Give your health care provider a list of all the medicines, herbs, non-prescription drugs, or dietary supplements you use. Also tell them if you smoke, drink alcohol,  or use illegal drugs. Some items may interact with your medicine. What should I watch for while using this medication? Visit your care team for regular checks on your progress. Your symptoms may appear to get worse during the first weeks of this therapy. Tell your care team if your symptoms do not start to get better or if they get worse after this time. Using this medication for a long time may weaken your bones. If you smoke or frequently drink alcohol you may increase your risk of bone loss. A family history of osteoporosis, chronic use of medications  for seizures (convulsions), or corticosteroids can also increase your risk of bone loss. The risk of bone fractures may be increased. Talk to your care team about your bone health. This medication may increase blood sugar. The risk may be higher in patients who already have diabetes. Ask your care team what you can do to lower your risk of diabetes while taking this medication. This medication should stop regular monthly menstruation in women. Tell your care team if you continue to menstruate. Talk to your care team if you wish to become pregnant or think you might be pregnant. This medication can cause serious birth defects if taken during pregnancy or for 12 weeks after stopping treatment. Talk to your care team about reliable forms of contraception. Do not breastfeed while taking this medication. This medication may cause infertility. Talk to your care team if you are concerned about your fertility. What side effects may I notice from receiving this medication? Side effects that you should report to your care team as soon as possible: Allergic reactions--skin rash, itching, hives, swelling of the face, lips, tongue, or throat Change in the amount of urine Heart attack--pain or tightness in the chest, shoulders, arms, or jaw, nausea, shortness of breath, cold or clammy skin, feeling faint or lightheaded Heart rhythm changes--fast or irregular heartbeat, dizziness, feeling faint or lightheaded, chest pain, trouble breathing High blood sugar (hyperglycemia)--increased thirst or amount of urine, unusual weakness or fatigue, blurry vision High calcium level--increased thirst or amount of urine, nausea, vomiting, confusion, unusual weakness or fatigue, bone pain Pain, redness, irritation, or bruising at the injection site Severe back pain, numbness or weakness of the hands, arms, legs, or feet, loss of coordination, loss of bowel or bladder control Stroke--sudden numbness or weakness of the face, arm,  or leg, trouble speaking, confusion, trouble walking, loss of balance or coordination, dizziness, severe headache, change in vision Swelling and pain of the tumor site or lymph nodes Trouble passing urine Side effects that usually do not require medical attention (report to your care team if they continue or are bothersome): Change in sex drive or performance Headache Hot flashes Rapid or extreme change in emotion or mood Sweating Swelling of the ankles, hands, or feet Unusual vaginal discharge, itching, or odor This list may not describe all possible side effects. Call your doctor for medical advice about side effects. You may report side effects to FDA at 1-800-FDA-1088. Where should I keep my medication? This medication is given in a hospital or clinic. It will not be stored at home. NOTE: This sheet is a summary. It may not cover all possible information. If you have questions about this medicine, talk to your doctor, pharmacist, or health care provider.  2024 Elsevier/Gold Standard (2021-10-11 00:00:00)

## 2023-05-26 NOTE — Assessment & Plan Note (Signed)
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% 09/25/2020: Right breast biopsy: DCIS grade 1, ER/PR positive 10/06/2020  breast MRI showed a 11 cm area of malignancy, with a second 1.9 cm mass, and skin thickening concerning for inflammatory breast carcinoma; at least 6 abnormal lymph nodes noted 10/13/2020 CT chest shows clear lungs and no obvious liver involvement but multiple lytic bone lesions in addition to the findings and MRI above 10/21/2020 spine MRI: Shows widely scattered thoracic and lumbar vertebral body metastases 10/24/2020: 2 cycles of dose dense Adriamycin and Cytoxan discontinued because of rising LFTs 12/01/2020: MRI abdomen: No evidence of metastatic disease in the liver 01/31/2021:Left mastectomy: Residual multifocal IDC 2 cm with intermediate grade DCIS, 15/15 lymph nodes positive, margins negative, lymphovascular space invasion present, ER 60%, PR 10%, HER2 negative, Ki-67 15% Right mastectomy: Fibrocystic change no residual cancer or DCIS -------------------------------------------------------------------------------------------------------------------------------------------  Current treatment: Anastrozole with goserelin and Verzenio and Zometa (since August 2022)   Anastrozole toxicities: Tolerating it very well   07/01/2022: Stable bone metastases similar 8 mm left axillary lymph node. 12/31/2022: CT CAP: No significant interval change in pulm metastases.  No evidence of new metastatic disease.  8 mm left axillary lymph node stable (stomach bug was in ED with nausea): Now resolved 01/05/2023: Abdominal pain: CT abdomen: Negative, CT head: Negative Hospitalization 01/24/2023-01/28/2023: diarrhea and fever   Next scans have been ordered for 07/04/2023 Continue with Zometa every 3 months.  Continue Zoladex injections

## 2023-06-02 ENCOUNTER — Inpatient Hospital Stay: Payer: Medicaid Other

## 2023-06-11 ENCOUNTER — Other Ambulatory Visit: Payer: Self-pay

## 2023-06-11 NOTE — Progress Notes (Signed)
 Specialty Pharmacy Refill Coordination Note  Gabriela Sullivan is a 33 y.o. female contacted today regarding refills of specialty medication(s) Abemaciclib  (VERZENIO )   Patient requested Delivery   Delivery date: 06/19/23   Verified address: [address] [place] [address]   Medication will be filled on 01.15.25.

## 2023-06-23 ENCOUNTER — Ambulatory Visit (HOSPITAL_COMMUNITY)
Admission: RE | Admit: 2023-06-23 | Discharge: 2023-06-23 | Disposition: A | Discharge status: Home / Self Care | Payer: Medicaid Other | Source: Ambulatory Visit | Attending: Hematology and Oncology | Admitting: Hematology and Oncology

## 2023-06-23 ENCOUNTER — Encounter (HOSPITAL_COMMUNITY): Payer: Self-pay

## 2023-06-23 DIAGNOSIS — C7951 Secondary malignant neoplasm of bone: Secondary | ICD-10-CM | POA: Insufficient documentation

## 2023-06-23 DIAGNOSIS — C50412 Malignant neoplasm of upper-outer quadrant of left female breast: Secondary | ICD-10-CM | POA: Diagnosis present

## 2023-06-23 DIAGNOSIS — Z170 Estrogen receptor positive status [ER+]: Secondary | ICD-10-CM | POA: Diagnosis present

## 2023-06-23 MED ORDER — IOHEXOL 300 MG/ML  SOLN
100.0000 mL | Freq: Once | INTRAMUSCULAR | Status: AC | PRN
  Administered 2023-06-23: 100 mL via INTRAVENOUS

## 2023-06-23 MED ORDER — HEPARIN SOD (PORK) LOCK FLUSH 100 UNIT/ML IV SOLN
500.0000 [IU] | Freq: Once | INTRAVENOUS | Status: AC
  Administered 2023-06-23: 500 [IU] via INTRAVENOUS

## 2023-06-23 MED ORDER — HEPARIN SOD (PORK) LOCK FLUSH 100 UNIT/ML IV SOLN
INTRAVENOUS | Status: AC
  Filled 2023-06-23: qty 5

## 2023-06-30 ENCOUNTER — Inpatient Hospital Stay: Payer: Medicaid Other

## 2023-06-30 ENCOUNTER — Inpatient Hospital Stay (HOSPITAL_BASED_OUTPATIENT_CLINIC_OR_DEPARTMENT_OTHER): Payer: Medicaid Other | Admitting: Hematology and Oncology

## 2023-06-30 ENCOUNTER — Inpatient Hospital Stay: Payer: Medicaid Other | Attending: Oncology

## 2023-06-30 VITALS — BP 108/72 | HR 94 | Temp 97.6°F | Resp 18 | Ht 60.0 in | Wt 166.4 lb

## 2023-06-30 DIAGNOSIS — Z170 Estrogen receptor positive status [ER+]: Secondary | ICD-10-CM

## 2023-06-30 DIAGNOSIS — Z9013 Acquired absence of bilateral breasts and nipples: Secondary | ICD-10-CM | POA: Diagnosis not present

## 2023-06-30 DIAGNOSIS — Z95828 Presence of other vascular implants and grafts: Secondary | ICD-10-CM

## 2023-06-30 DIAGNOSIS — Z79811 Long term (current) use of aromatase inhibitors: Secondary | ICD-10-CM | POA: Diagnosis not present

## 2023-06-30 DIAGNOSIS — C7800 Secondary malignant neoplasm of unspecified lung: Secondary | ICD-10-CM | POA: Insufficient documentation

## 2023-06-30 DIAGNOSIS — Z1721 Progesterone receptor positive status: Secondary | ICD-10-CM | POA: Insufficient documentation

## 2023-06-30 DIAGNOSIS — Z1732 Human epidermal growth factor receptor 2 negative status: Secondary | ICD-10-CM | POA: Insufficient documentation

## 2023-06-30 DIAGNOSIS — C50412 Malignant neoplasm of upper-outer quadrant of left female breast: Secondary | ICD-10-CM | POA: Diagnosis not present

## 2023-06-30 DIAGNOSIS — Z5111 Encounter for antineoplastic chemotherapy: Secondary | ICD-10-CM | POA: Diagnosis present

## 2023-06-30 DIAGNOSIS — C7951 Secondary malignant neoplasm of bone: Secondary | ICD-10-CM | POA: Insufficient documentation

## 2023-06-30 LAB — CMP (CANCER CENTER ONLY)
ALT: 19 U/L (ref 0–44)
AST: 25 U/L (ref 15–41)
Albumin: 4.5 g/dL (ref 3.5–5.0)
Alkaline Phosphatase: 45 U/L (ref 38–126)
Anion gap: 6 (ref 5–15)
BUN: 20 mg/dL (ref 6–20)
CO2: 30 mmol/L (ref 22–32)
Calcium: 9.7 mg/dL (ref 8.9–10.3)
Chloride: 107 mmol/L (ref 98–111)
Creatinine: 0.75 mg/dL (ref 0.44–1.00)
GFR, Estimated: >60 mL/min (ref >60)
Glucose, Bld: 101 mg/dL — ABNORMAL HIGH (ref 70–99)
Potassium: 3.5 mmol/L (ref 3.5–5.1)
Sodium: 143 mmol/L (ref 135–145)
Total Bilirubin: 0.7 mg/dL (ref 0.0–1.2)
Total Protein: 7.7 g/dL (ref 6.5–8.1)

## 2023-06-30 LAB — CBC WITH DIFFERENTIAL (CANCER CENTER ONLY)
Abs Immature Granulocytes: 0.01 10*3/uL (ref 0.00–0.07)
Basophils Absolute: 0.1 10*3/uL (ref 0.0–0.1)
Basophils Relative: 1 %
Eosinophils Absolute: 0.1 10*3/uL (ref 0.0–0.5)
Eosinophils Relative: 3 %
HCT: 36.3 % (ref 36.0–46.0)
Hemoglobin: 13.4 g/dL (ref 12.0–15.0)
Immature Granulocytes: 0 %
Lymphocytes Relative: 37 %
Lymphs Abs: 1.4 10*3/uL (ref 0.7–4.0)
MCH: 34 pg (ref 26.0–34.0)
MCHC: 36.9 g/dL — ABNORMAL HIGH (ref 30.0–36.0)
MCV: 92.1 fL (ref 80.0–100.0)
Monocytes Absolute: 0.2 10*3/uL (ref 0.1–1.0)
Monocytes Relative: 6 %
Neutro Abs: 2 10*3/uL (ref 1.7–7.7)
Neutrophils Relative %: 53 %
Platelet Count: 199 10*3/uL (ref 150–400)
RBC: 3.94 MIL/uL (ref 3.87–5.11)
RDW: 12.6 % (ref 11.5–15.5)
WBC Count: 3.7 10*3/uL — ABNORMAL LOW (ref 4.0–10.5)
nRBC: 0 % (ref 0.0–0.2)

## 2023-06-30 MED ORDER — GOSERELIN ACETATE 3.6 MG ~~LOC~~ IMPL
3.6000 mg | DRUG_IMPLANT | Freq: Once | SUBCUTANEOUS | Status: AC
  Administered 2023-06-30: 3.6 mg via SUBCUTANEOUS
  Filled 2023-06-30: qty 3.6

## 2023-06-30 NOTE — Assessment & Plan Note (Signed)
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% 09/25/2020: Right breast biopsy: DCIS grade 1, ER/PR positive 10/06/2020  breast MRI showed a 11 cm area of malignancy, with a second 1.9 cm mass, and skin thickening concerning for inflammatory breast carcinoma; at least 6 abnormal lymph nodes noted 10/13/2020 CT chest shows clear lungs and no obvious liver involvement but multiple lytic bone lesions in addition to the findings and MRI above 10/21/2020 spine MRI: Shows widely scattered thoracic and lumbar vertebral body metastases 10/24/2020: 2 cycles of dose dense Adriamycin and Cytoxan discontinued because of rising LFTs 12/01/2020: MRI abdomen: No evidence of metastatic disease in the liver 01/31/2021:Left mastectomy: Residual multifocal IDC 2 cm with intermediate grade DCIS, 15/15 lymph nodes positive, margins negative, lymphovascular space invasion present, ER 60%, PR 10%, HER2 negative, Ki-67 15% Right mastectomy: Fibrocystic change no residual cancer or DCIS -------------------------------------------------------------------------------------------------------------------------------------------  Current treatment: Anastrozole with goserelin and Verzenio and Zometa (since August 2022)   Anastrozole toxicities: Tolerating it very well   07/01/2022: Stable bone metastases similar 8 mm left axillary lymph node. 12/31/2022: CT CAP: No significant interval change in pulm metastases.  No evidence of new metastatic disease.  8 mm left axillary lymph node stable (stomach bug was in ED with nausea): Now resolved 01/05/2023: Abdominal pain: CT abdomen: Negative, CT head: Negative Hospitalization 01/24/2023-01/28/2023: diarrhea and fever 06/23/2023: CT CAP: Stable examination, similar bone metastases, unchanged 8 mm left axillary lymph node  Based on excellent results on the CT scans we will continue with the same treatment.

## 2023-06-30 NOTE — Progress Notes (Signed)
Patient Care Team: Leane Call, MD as PCP - General (Family Medicine) [name], RN as Oncology Nurse Navigator Pershing Proud, RN as Oncology Nurse Navigator Harriette Bouillon, MD as Consulting Physician (General Surgery) Dorothy Puffer, MD as Consulting Physician (Radiation Oncology) [name], MD as Consulting Physician (Hematology and Oncology)  DIAGNOSIS:  Encounter Diagnosis  Name Primary?   Malignant neoplasm of upper-outer quadrant of left breast in female, estrogen receptor positive (HCC) Yes    SUMMARY OF ONCOLOGIC HISTORY: Oncology History  Ductal carcinoma in situ (DCIS) of right breast  09/29/2020 Initial Diagnosis   Ductal carcinoma in situ (DCIS) of right breast   10/04/2020 Cancer Staging   Staging form: Breast, AJCC 8th Edition - Clinical: Stage 0 (cTis (DCIS), cN0, cM0) - Signed by Lowella Dell, MD on 10/04/2020   11/25/2020 Genetic Testing   Negative genetic testing:  No pathogenic variants detected on the [employer] CancerNext-Expanded + RNAinsight panel. The report date is 11/25/2020.   The CancerNext-Expanded + RNAinsight gene panel offered by [employer] and includes sequencing and rearrangement analysis for the following 77 genes: AIP, ALK, APC, ATM, AXIN2, BAP1, BARD1, BLM, BMPR1A, BRCA1, BRCA2, BRIP1, CDC73, CDH1, CDK4, CDKN1B, CDKN2A, CHEK2, CTNNA1, DICER1, FANCC, FH, FLCN, GALNT12, KIF1B, LZTR1, MAX, MEN1, MET, MLH1, MSH2, MSH3, MSH6, MUTYH, NBN, NF1, NF2, NTHL1, PALB2, PHOX2B, PMS2, POT1, PRKAR1A, PTCH1, PTEN, RAD51C, RAD51D, RB1, RECQL, RET, SDHA, SDHAF2, SDHB, SDHC, SDHD, SMAD4, SMARCA4, SMARCB1, SMARCE1, STK11, SUFU, TMEM127, TP53, TSC1, TSC2, VHL and XRCC2 (sequencing and deletion/duplication); EGFR, EGLN1, HOXB13, KIT, MITF, PDGFRA, POLD1 and POLE (sequencing only); EPCAM and GREM1 (deletion/duplication only). RNA data is routinely analyzed for use in variant interpretation for all genes.   Malignant neoplasm of upper-outer quadrant of  left breast in female, estrogen receptor positive (HCC)  09/29/2020 Initial Diagnosis   Malignant neoplasm of upper-outer quadrant of left breast in female, estrogen receptor positive (HCC)   10/01/2020 Relapse/Recurrence   Spine MRI: Scattered thoracic and lumbar vertebral body and posterior element metastases   10/25/2020 - 11/10/2020 Chemotherapy   2 cycles of Adriamycin and Cytoxan.  Chemo discontinued because of rising LFTs.       11/25/2020 Genetic Testing   Negative genetic testing:  No pathogenic variants detected on the [employer] CancerNext-Expanded + RNAinsight panel. The report date is 11/25/2020.   The CancerNext-Expanded + RNAinsight gene panel offered by [employer] and includes sequencing and rearrangement analysis for the following 77 genes: AIP, ALK, APC, ATM, AXIN2, BAP1, BARD1, BLM, BMPR1A, BRCA1, BRCA2, BRIP1, CDC73, CDH1, CDK4, CDKN1B, CDKN2A, CHEK2, CTNNA1, DICER1, FANCC, FH, FLCN, GALNT12, KIF1B, LZTR1, MAX, MEN1, MET, MLH1, MSH2, MSH3, MSH6, MUTYH, NBN, NF1, NF2, NTHL1, PALB2, PHOX2B, PMS2, POT1, PRKAR1A, PTCH1, PTEN, RAD51C, RAD51D, RB1, RECQL, RET, SDHA, SDHAF2, SDHB, SDHC, SDHD, SMAD4, SMARCA4, SMARCB1, SMARCE1, STK11, SUFU, TMEM127, TP53, TSC1, TSC2, VHL and XRCC2 (sequencing and deletion/duplication); EGFR, EGLN1, HOXB13, KIT, MITF, PDGFRA, POLD1 and POLE (sequencing only); EPCAM and GREM1 (deletion/duplication only). RNA data is routinely analyzed for use in variant interpretation for all genes.    Anti-estrogen oral therapy   anastrozole started 12/06/2020 goserelin started 10/11/2020  Verzinio started Aug 2022 zolendronate started 11/02/2018. Every 12 weeks.   01/31/2021 Surgery   Left mastectomy: Residual multifocal IDC 2 cm with intermediate grade DCIS, 15/15 lymph nodes positive, margins negative, lymphovascular space invasion present, ER 60%, PR 10%, HER2 negative, Ki-67 15% Right mastectomy: Fibrocystic change no residual cancer or DCIS     CHIEF COMPLIANT:  Metastatic breast  cancer follow-up on Verzenio and to review recent scans  HISTORY OF PRESENT ILLNESS:   History of Present Illness   The patient, with a history of cancer, has been on a regimen of  Verzenio and anastrozole. She reports tolerating the medications well, with no further issues of diarrhea. She also takes potassium occasionally, vitamin D regularly, and Tums for calcium. She has a prescription for a nausea pill, but she is not taking it.  The patient has been maintaining her energy levels and has been proactive in scheduling her appointments early. She has been adhering to her medication regimen, with no need for any refills at this time.  She has been undergoing regular scans, the most recent of which showed no new spots or changes in the existing ones, indicating stability and improvement in her condition. The patient also has a small lymph node under her left armpit, which has remained unchanged in size.  Her recent lab results showed an improved white count and hemoglobin level, with platelets remaining fine.         ALLERGIES:  has no known allergies.  MEDICATIONS:  Current Outpatient Medications  Medication Sig Dispense Refill   abemaciclib (VERZENIO) 100 MG tablet Take 1 tablet (100 mg total) by mouth 2 (two) times daily. Swallow tablets whole. Do not chew, crush, or split tablets before swallowing. 56 tablet 3   anastrozole (ARIMIDEX) 1 MG tablet Take 1 tablet (1 mg total) by mouth daily. 90 tablet 3   calcium carbonate (TUMS - DOSED IN MG ELEMENTAL CALCIUM) 500 MG chewable tablet Chew 2 tablets by mouth every other day.     potassium chloride (KLOR-CON) 10 MEQ tablet Take 1 tablet (10 mEq total) by mouth daily. 20 tablet 0   Vitamin D, Ergocalciferol, (DRISDOL) 1.25 MG (50000 UNIT) CAPS capsule Take 50,000 Units by mouth once a week.     No current facility-administered medications for this visit.    PHYSICAL EXAMINATION: ECOG PERFORMANCE STATUS: 1 - Symptomatic  but completely ambulatory  Vitals:   06/30/23 0750  BP: 108/72  Pulse: 94  Resp: 18  Temp: 97.6 F (36.4 C)  SpO2: 100%   Filed Weights   06/30/23 0750  Weight: 166 lb 6.4 oz (75.5 kg)      LABORATORY DATA:  I have reviewed the data as listed    Latest Ref Rng & Units 05/26/2023    7:57 AM 04/28/2023    7:30 AM 04/03/2023    8:14 AM  CMP  Glucose 70 - 99 mg/dL 96  95  86   BUN 6 - 20 mg/dL 14  17  20    Creatinine 0.44 - 1.00 mg/dL 1.61  0.96  0.45   Sodium 135 - 145 mmol/L 143  142  141   Potassium 3.5 - 5.1 mmol/L 3.4  3.9  3.5   Chloride 98 - 111 mmol/L 110  110  107   CO2 22 - 32 mmol/L 29  27  27    Calcium 8.9 - 10.3 mg/dL 9.1  9.2  9.5   Total Protein 6.5 - 8.1 g/dL 6.6  7.2  7.7   Total Bilirubin <1.2 mg/dL 0.5  0.5  0.8   Alkaline Phos 38 - 126 U/L 37  37  45   AST 15 - 41 U/L 23  24  22    ALT 0 - 44 U/L 16  16  17      Lab Results  Component Value Date   WBC 3.7 (L) 06/30/2023  HGB 13.4 06/30/2023   HCT 36.3 06/30/2023   MCV 92.1 06/30/2023   PLT 199 06/30/2023   NEUTROABS 2.0 06/30/2023    ASSESSMENT & PLAN:  Malignant neoplasm of upper-outer quadrant of left breast in female, estrogen receptor positive [facility]) 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% 09/25/2020: Right breast biopsy: DCIS grade 1, ER/PR positive 10/06/2020  breast MRI showed a 11 cm area of malignancy, with a second 1.9 cm mass, and skin thickening concerning for inflammatory breast carcinoma; at least 6 abnormal lymph nodes noted 10/13/2020 CT chest shows clear lungs and no obvious liver involvement but multiple lytic bone lesions in addition to the findings and MRI above 10/21/2020 spine MRI: Shows widely scattered thoracic and lumbar vertebral body metastases 10/24/2020: 2 cycles of dose dense Adriamycin and Cytoxan discontinued because of rising LFTs 12/01/2020: MRI abdomen: No evidence of metastatic disease  in the liver 01/31/2021:Left mastectomy: Residual multifocal IDC 2 cm with intermediate grade DCIS, 15/15 lymph nodes positive, margins negative, lymphovascular space invasion present, ER 60%, PR 10%, HER2 negative, Ki-67 15% Right mastectomy: Fibrocystic change no residual cancer or DCIS -------------------------------------------------------------------------------------------------------------------------------------------  Current treatment: Anastrozole with goserelin and Verzenio and Zometa (since August 2022)   Anastrozole toxicities: Tolerating it very well   07/01/2022: Stable bone metastases similar 8 mm left axillary lymph node. 12/31/2022: CT CAP: No significant interval change in pulm metastases.  No evidence of new metastatic disease.  8 mm left axillary lymph node stable (stomach bug was in ED with nausea): Now resolved 01/05/2023: Abdominal pain: CT abdomen: Negative, CT head: Negative Hospitalization 01/24/2023-01/28/2023: diarrhea and fever 06/23/2023: CT CAP: Stable examination, similar bone metastases, unchanged 8 mm left axillary lymph node  Based on excellent results on the CT scans we will continue with the same treatment.     No orders of the defined types were placed in this encounter.  The patient has a good understanding of the overall plan. she agrees with it. she will call with any problems that may develop before the next visit here. Total time spent: 30 mins including face to face time and time spent for planning, charting and co-ordination of care   Tamsen Meek, MD 06/30/23

## 2023-07-03 ENCOUNTER — Inpatient Hospital Stay: Payer: Medicaid Other

## 2023-07-14 ENCOUNTER — Telehealth: Payer: Self-pay

## 2023-07-14 ENCOUNTER — Ambulatory Visit: Payer: Medicaid Other | Attending: Surgery

## 2023-07-14 VITALS — Wt 164.2 lb

## 2023-07-14 DIAGNOSIS — R748 Abnormal levels of other serum enzymes: Secondary | ICD-10-CM

## 2023-07-14 DIAGNOSIS — K7581 Nonalcoholic steatohepatitis (NASH): Secondary | ICD-10-CM

## 2023-07-14 DIAGNOSIS — Z483 Aftercare following surgery for neoplasm: Secondary | ICD-10-CM | POA: Insufficient documentation

## 2023-07-14 NOTE — Telephone Encounter (Signed)
-----   Message from [facility] sent at 01/17/2023 10:07 AM EDT ----- Regarding: due for LFTs Due for LFTs in Feb

## 2023-07-14 NOTE — Telephone Encounter (Signed)
 Due for LFTs. Order entered. MyChart message to patient to go to the lab

## 2023-07-14 NOTE — Therapy (Signed)
 OUTPATIENT PHYSICAL THERAPY SOZO SCREENING NOTE   Patient Name: Gabriela Sullivan MRN: [id number] DOB:24-Apr-1991, 33 y.o., female Today's Date: 07/14/2023  PCP: Roselyn Connor, MD REFERRING PROVIDER: Sim Dryer, MD   PT End of Session - 07/14/23 9528     Visit Number 6   # unchanged due to screen only   PT Start Time 0856    PT Stop Time 0900    PT Time Calculation (min) 4 min    Activity Tolerance Patient tolerated treatment well    Behavior During Therapy [facility] for tasks assessed/performed             Past Medical History:  Diagnosis Date   Asthma    remote   Breast cancer (HCC)    Elevated liver enzymes    Family history of breast cancer    Family history of ovarian cancer    Family history of prostate cancer    Family history of stomach cancer    Hepatic steatosis    Hyperglycemia    A1c 7.6% 12/19/20   Migraines    PCOS (polycystic ovarian syndrome)    Pre-diabetes    Past Surgical History:  Procedure Laterality Date   MASTECTOMY W/ SENTINEL NODE BIOPSY Right 01/31/2021   Procedure: RIGHT SIMPLE MASTECTOMY;  Surgeon: Sim Dryer, MD;  Location: [facility] OR;  Service: General;  Laterality: Right;   MODIFIED MASTECTOMY Left 01/31/2021   Procedure: LEFT MODIFIED RADICAL MASTECTOMY;  Surgeon: Sim Dryer, MD;  Location: [facility] OR;  Service: General;  Laterality: Left;   NO PAST SURGERIES     PORTACATH PLACEMENT Right 10/18/2020   Procedure: INSERTION PORT-A-CATH;  Surgeon: Sim Dryer, MD;  Location: [facility];  Service: General;  Laterality: Right;   Patient Active Problem List   Diagnosis Date Noted   Sepsis (HCC) 01/10/2023   Pancytopenia (HCC) 01/10/2023   Febrile neutropenia (HCC) 01/10/2023   Hypokalemia 01/10/2023   Hyponatremia 01/10/2023   Hypocalcemia 01/10/2023   Hyperbilirubinemia 01/10/2023   Elevated lipase 01/10/2023   Nausea, vomiting and diarrhea 01/10/2023   Transaminitis 01/10/2023   Nonalcoholic steatohepatitis  06/21/2022   Type 2 diabetes mellitus (HCC) 06/20/2022   History of therapeutic radiation 11/07/2021   Left breast cancer with T3 tumor, >5 cm in greatest dimension (HCC) 01/31/2021   Malignant neoplasm metastatic to bone (HCC) 12/28/2020   Port-A-Cath in place 12/06/2020   Genetic testing 11/25/2020   Goals of care, counseling/discussion 10/25/2020   Lytic bone lesions on xray 10/16/2020   Family history of ovarian cancer    Family history of prostate cancer    Family history of breast cancer    Family history of stomach cancer    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   Malignant neoplasm of upper-outer quadrant of left breast in female, estrogen receptor positive (HCC) 09/29/2020   Elevated LFTs     REFERRING DIAG: right breast cancer at risk for lymphedema  THERAPY DIAG:  Aftercare following surgery for neoplasm  PERTINENT HISTORY: Patient was diagnosed on 09/26/2020 with 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 It is ER/PR positive and HER2 negative with a Ki67 of 15%. She underwent neoadjuvant chemotherapy beginning 10/24/2020 for 2 rounds which was stopped due to elevated LFTs. She had a bilateral mastectomy with left axillary node dissection on 01/31/2021 with 15/15 nodes positive. She started Anastrozole  on 12/06/2020. 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   PRECAUTIONS:  right UE Lymphedema risk, None  SUBJECTIVE: Pt returns for her 6 month L-Dex screen. "I've started working out and have changed my diet. I feel so much better!"  PAIN:  Are you having pain? No  SOZO SCREENING: Patient was assessed today using the SOZO machine to determine the lymphedema index score. This was compared to her baseline score. It was determined that she is within the recommended range when compared to her baseline and no further action is needed at this time. She will continue SOZO screenings. These  are done every 3 months for 2 years post operatively followed by every 6 months for 2 years, and then annually.   L-DEX FLOWSHEETS - 07/14/23 0800       L-DEX LYMPHEDEMA SCREENING   Measurement Type Unilateral    L-DEX MEASUREMENT EXTREMITY Upper Extremity    POSITION  Standing    DOMINANT SIDE Right    At Risk Side Left    BASELINE SCORE (UNILATERAL) 2.9    L-DEX SCORE (UNILATERAL) 1.1    VALUE CHANGE (UNILAT) -1.8               Denyce Flank, PTA 07/14/2023, 8:58 AM

## 2023-07-15 ENCOUNTER — Other Ambulatory Visit (HOSPITAL_COMMUNITY): Payer: Self-pay

## 2023-07-15 ENCOUNTER — Other Ambulatory Visit: Payer: Self-pay

## 2023-07-15 NOTE — Progress Notes (Signed)
Specialty Pharmacy Refill Coordination Note  Bich Mchaney is a 33 y.o. female contacted today regarding refills of specialty medication(s) Abemaciclib [name])   Patient requested Delivery   Delivery date: 07/17/23   Verified address: [address]   [place] [address]   Medication will be filled on 07/16/23.

## 2023-07-16 NOTE — Telephone Encounter (Signed)
Called and spoke to patient.  She will go to the lab this week or next for LFTs

## 2023-07-21 ENCOUNTER — Encounter: Payer: Self-pay | Admitting: Gastroenterology

## 2023-07-21 ENCOUNTER — Other Ambulatory Visit (INDEPENDENT_AMBULATORY_CARE_PROVIDER_SITE_OTHER): Payer: Medicaid Other

## 2023-07-21 DIAGNOSIS — K7581 Nonalcoholic steatohepatitis (NASH): Secondary | ICD-10-CM | POA: Diagnosis not present

## 2023-07-21 DIAGNOSIS — R748 Abnormal levels of other serum enzymes: Secondary | ICD-10-CM

## 2023-07-21 LAB — HEPATIC FUNCTION PANEL
ALT: 19 U/L (ref 0–35)
AST: 33 U/L (ref 0–37)
Albumin: 4.5 g/dL (ref 3.5–5.2)
Alkaline Phosphatase: 36 U/L — ABNORMAL LOW (ref 39–117)
Bilirubin, Direct: 0.2 mg/dL (ref 0.0–0.3)
Total Bilirubin: 1 mg/dL (ref 0.2–1.2)
Total Protein: 7.6 g/dL (ref 6.0–8.3)

## 2023-08-01 ENCOUNTER — Inpatient Hospital Stay: Payer: Medicaid Other

## 2023-08-05 ENCOUNTER — Other Ambulatory Visit (HOSPITAL_COMMUNITY): Payer: Self-pay

## 2023-08-08 ENCOUNTER — Other Ambulatory Visit (HOSPITAL_COMMUNITY): Payer: Self-pay

## 2023-08-11 ENCOUNTER — Other Ambulatory Visit: Payer: Self-pay

## 2023-08-11 NOTE — Progress Notes (Signed)
 Specialty Pharmacy Refill Coordination Note  Gabriela Sullivan is a 33 y.o. female contacted today regarding refills of specialty medication(s) Abemaciclib [name])   Patient requested Delivery   Delivery date: 08/15/23   Verified address: [address]   [place] [address]   Medication will be filled on 03.13.25.

## 2023-08-29 ENCOUNTER — Inpatient Hospital Stay: Payer: Medicaid Other | Attending: Oncology

## 2023-08-29 ENCOUNTER — Inpatient Hospital Stay

## 2023-08-29 ENCOUNTER — Other Ambulatory Visit: Payer: Self-pay | Admitting: *Deleted

## 2023-08-29 ENCOUNTER — Inpatient Hospital Stay: Payer: Medicaid Other

## 2023-08-29 VITALS — BP 121/81 | HR 73 | Temp 98.2°F | Resp 17

## 2023-08-29 VITALS — BP 122/72 | HR 71 | Temp 98.3°F | Resp 18

## 2023-08-29 DIAGNOSIS — Z170 Estrogen receptor positive status [ER+]: Secondary | ICD-10-CM | POA: Insufficient documentation

## 2023-08-29 DIAGNOSIS — Z1721 Progesterone receptor positive status: Secondary | ICD-10-CM | POA: Diagnosis not present

## 2023-08-29 DIAGNOSIS — C50412 Malignant neoplasm of upper-outer quadrant of left female breast: Secondary | ICD-10-CM

## 2023-08-29 DIAGNOSIS — C7951 Secondary malignant neoplasm of bone: Secondary | ICD-10-CM | POA: Insufficient documentation

## 2023-08-29 DIAGNOSIS — Z5111 Encounter for antineoplastic chemotherapy: Secondary | ICD-10-CM | POA: Insufficient documentation

## 2023-08-29 DIAGNOSIS — Z1732 Human epidermal growth factor receptor 2 negative status: Secondary | ICD-10-CM | POA: Insufficient documentation

## 2023-08-29 DIAGNOSIS — Z95828 Presence of other vascular implants and grafts: Secondary | ICD-10-CM

## 2023-08-29 LAB — CBC WITH DIFFERENTIAL (CANCER CENTER ONLY)
Abs Immature Granulocytes: 0 10*3/uL (ref 0.00–0.07)
Basophils Absolute: 0 10*3/uL (ref 0.0–0.1)
Basophils Relative: 1 %
Eosinophils Absolute: 0.1 10*3/uL (ref 0.0–0.5)
Eosinophils Relative: 3 %
HCT: 35.2 % — ABNORMAL LOW (ref 36.0–46.0)
Hemoglobin: 12.7 g/dL (ref 12.0–15.0)
Immature Granulocytes: 0 %
Lymphocytes Relative: 37 %
Lymphs Abs: 1.1 10*3/uL (ref 0.7–4.0)
MCH: 34.7 pg — ABNORMAL HIGH (ref 26.0–34.0)
MCHC: 36.1 g/dL — ABNORMAL HIGH (ref 30.0–36.0)
MCV: 96.2 fL (ref 80.0–100.0)
Monocytes Absolute: 0.3 10*3/uL (ref 0.1–1.0)
Monocytes Relative: 8 %
Neutro Abs: 1.5 10*3/uL — ABNORMAL LOW (ref 1.7–7.7)
Neutrophils Relative %: 51 %
Platelet Count: 190 10*3/uL (ref 150–400)
RBC: 3.66 MIL/uL — ABNORMAL LOW (ref 3.87–5.11)
RDW: 12.6 % (ref 11.5–15.5)
WBC Count: 3 10*3/uL — ABNORMAL LOW (ref 4.0–10.5)
nRBC: 0 % (ref 0.0–0.2)

## 2023-08-29 LAB — CMP (CANCER CENTER ONLY)
ALT: 21 U/L (ref 0–44)
AST: 30 U/L (ref 15–41)
Albumin: 4.4 g/dL (ref 3.5–5.0)
Alkaline Phosphatase: 46 U/L (ref 38–126)
Anion gap: 6 (ref 5–15)
BUN: 20 mg/dL (ref 6–20)
CO2: 27 mmol/L (ref 22–32)
Calcium: 9.3 mg/dL (ref 8.9–10.3)
Chloride: 107 mmol/L (ref 98–111)
Creatinine: 0.62 mg/dL (ref 0.44–1.00)
GFR, Estimated: >60 mL/min (ref >60)
Glucose, Bld: 91 mg/dL (ref 70–99)
Potassium: 3.7 mmol/L (ref 3.5–5.1)
Sodium: 140 mmol/L (ref 135–145)
Total Bilirubin: 0.8 mg/dL (ref 0.0–1.2)
Total Protein: 7.6 g/dL (ref 6.5–8.1)

## 2023-08-29 MED ORDER — ZOLEDRONIC ACID 4 MG/100ML IV SOLN
4.0000 mg | Freq: Once | INTRAVENOUS | Status: AC
  Administered 2023-08-29: 4 mg via INTRAVENOUS
  Filled 2023-08-29: qty 100

## 2023-08-29 MED ORDER — HEPARIN SOD (PORK) LOCK FLUSH 100 UNIT/ML IV SOLN
500.0000 [IU] | Freq: Once | INTRAVENOUS | Status: AC | PRN
  Administered 2023-08-29: 500 [IU]

## 2023-08-29 MED ORDER — GOSERELIN ACETATE 3.6 MG ~~LOC~~ IMPL
3.6000 mg | DRUG_IMPLANT | Freq: Once | SUBCUTANEOUS | Status: AC
  Administered 2023-08-29: 3.6 mg via SUBCUTANEOUS
  Filled 2023-08-29: qty 3.6

## 2023-08-29 MED ORDER — SODIUM CHLORIDE 0.9 % IV SOLN: Freq: Once | INTRAVENOUS | Status: AC

## 2023-08-29 MED ORDER — ZOLEDRONIC ACID 4 MG/100ML IV SOLN: 4.0000 mg | Freq: Once | INTRAVENOUS | Status: DC

## 2023-08-29 MED ORDER — SODIUM CHLORIDE 0.9% FLUSH
10.0000 mL | Freq: Once | INTRAVENOUS | Status: AC | PRN
  Administered 2023-08-29: 10 mL

## 2023-08-29 MED ORDER — SODIUM CHLORIDE 0.9% FLUSH
3.0000 mL | Freq: Once | INTRAVENOUS | Status: AC | PRN
  Administered 2023-08-29: 10 mL

## 2023-08-29 NOTE — Addendum Note (Signed)
 Addended by: Merlene Laughter B on: 08/29/2023 09:37 AM   Modules accepted: Orders

## 2023-08-29 NOTE — Patient Instructions (Signed)

## 2023-09-09 ENCOUNTER — Other Ambulatory Visit: Payer: Self-pay | Admitting: Pharmacy Technician

## 2023-09-09 ENCOUNTER — Other Ambulatory Visit: Payer: Self-pay | Admitting: Hematology and Oncology

## 2023-09-09 ENCOUNTER — Other Ambulatory Visit: Payer: Self-pay

## 2023-09-09 NOTE — Progress Notes (Signed)
 Specialty Pharmacy Refill Coordination Note  Gabriela Sullivan is a 34 y.o. female contacted today regarding refills of specialty medication(s) Abemaciclib [name])   Patient requested Delivery   Delivery date: 09/17/23   Verified address: [address] [place] [address]   Medication will be filled on 09/16/23.   This fill date is pending response to refill request from provider. Patient is aware and if they have not received fill by intended date they must follow up with pharmacy.

## 2023-09-10 ENCOUNTER — Other Ambulatory Visit: Payer: Self-pay

## 2023-09-10 MED ORDER — ABEMACICLIB 100 MG PO TABS
100.0000 mg | ORAL_TABLET | Freq: Two times a day (BID) | ORAL | 3 refills | Status: DC
  Filled 2023-09-10: qty 56, 28d supply, fill #0
  Filled 2023-10-02: qty 56, 28d supply, fill #1
  Filled 2023-10-20: qty 56, 28d supply, fill #2
  Filled 2023-12-01: qty 56, 28d supply, fill #3

## 2023-09-29 ENCOUNTER — Inpatient Hospital Stay: Payer: Medicaid Other

## 2023-09-29 ENCOUNTER — Inpatient Hospital Stay: Payer: Medicaid Other | Attending: Oncology

## 2023-09-29 DIAGNOSIS — Z170 Estrogen receptor positive status [ER+]: Secondary | ICD-10-CM | POA: Diagnosis not present

## 2023-09-29 DIAGNOSIS — C50412 Malignant neoplasm of upper-outer quadrant of left female breast: Secondary | ICD-10-CM | POA: Diagnosis not present

## 2023-09-29 DIAGNOSIS — Z5111 Encounter for antineoplastic chemotherapy: Secondary | ICD-10-CM | POA: Diagnosis present

## 2023-09-29 DIAGNOSIS — Z95828 Presence of other vascular implants and grafts: Secondary | ICD-10-CM

## 2023-09-29 MED ORDER — GOSERELIN ACETATE 3.6 MG ~~LOC~~ IMPL
3.6000 mg | DRUG_IMPLANT | Freq: Once | SUBCUTANEOUS | Status: AC
  Administered 2023-09-29: 3.6 mg via SUBCUTANEOUS
  Filled 2023-09-29: qty 3.6

## 2023-10-01 ENCOUNTER — Encounter (HOSPITAL_COMMUNITY): Payer: Self-pay

## 2023-10-01 ENCOUNTER — Other Ambulatory Visit (HOSPITAL_COMMUNITY): Payer: Self-pay

## 2023-10-02 ENCOUNTER — Other Ambulatory Visit: Payer: Self-pay

## 2023-10-02 NOTE — Progress Notes (Signed)
 Specialty Pharmacy Refill Coordination Note  Gabriela Sullivan is a 33 y.o. female contacted today regarding refills of specialty medication(s) Abemaciclib  (VERZENIO )   Patient requested (Patient-Rptd) Delivery   Delivery date: (Patient-Rptd) 10/15/23   Verified address: (Patient-Rptd) [address] [place], [address]   Medication will be filled on 10/14/23.   Patient was left a voicemail if they have any questions for a pharmacist to call the specialty pharmacy back at [phone]

## 2023-10-20 ENCOUNTER — Other Ambulatory Visit: Payer: Self-pay

## 2023-10-20 ENCOUNTER — Other Ambulatory Visit: Payer: Self-pay | Admitting: Pharmacy Technician

## 2023-10-20 NOTE — Progress Notes (Signed)
 Specialty Pharmacy Refill Coordination Note  Gabriela Sullivan is a 33 y.o. female contacted today regarding refills of specialty medication(s) No data recorded  Patient requested Delivery   Delivery date: 11/10/23   Verified address: [address] [place] [address]   Medication will be filled on 11/07/23.

## 2023-11-07 ENCOUNTER — Other Ambulatory Visit: Payer: Self-pay

## 2023-11-12 ENCOUNTER — Other Ambulatory Visit: Payer: Self-pay

## 2023-11-12 NOTE — Progress Notes (Signed)
 Specialty Pharmacy Ongoing Clinical Assessment Note  Gabriela Sullivan is a 33 y.o. female who is being followed by the specialty pharmacy service for RxSp Oncology   Patient's specialty medication(s) reviewed today: Abemaciclib  (VERZENIO )   Missed doses in the last 4 weeks: 0   Patient/Caregiver did not have any additional questions or concerns.   Therapeutic benefit summary: Patient is achieving benefit   Adverse events/side effects summary: No adverse events/side effects   Patient's therapy is appropriate to: Continue    Goals Addressed             This Visit's Progress    Slow Disease Progression   On track    Patient is on track. Patient will maintain adherence. CT in January 2025 showed stable disease, no new spots or changes in existing ones.         Follow up: 6 months  [facility]

## 2023-12-01 ENCOUNTER — Other Ambulatory Visit: Payer: Self-pay

## 2023-12-01 ENCOUNTER — Encounter (INDEPENDENT_AMBULATORY_CARE_PROVIDER_SITE_OTHER): Payer: Self-pay

## 2023-12-01 ENCOUNTER — Other Ambulatory Visit (HOSPITAL_COMMUNITY): Payer: Self-pay

## 2023-12-01 NOTE — Progress Notes (Signed)
 Specialty Pharmacy Refill Coordination Note  Gabriela Sullivan is a 33 y.o. female contacted today regarding refills of specialty medication(s) Abemaciclib  (VERZENIO )   Patient requested (Patient-Rptd) Delivery   Delivery date: 12/03/23   Verified address: (Patient-Rptd) [address] [place] [address]   Medication will be filled on 12/02/23.

## 2023-12-02 ENCOUNTER — Other Ambulatory Visit: Payer: Self-pay

## 2023-12-19 ENCOUNTER — Other Ambulatory Visit (HOSPITAL_COMMUNITY): Payer: Self-pay

## 2023-12-19 ENCOUNTER — Other Ambulatory Visit: Payer: Self-pay

## 2023-12-19 ENCOUNTER — Encounter (INDEPENDENT_AMBULATORY_CARE_PROVIDER_SITE_OTHER): Payer: Self-pay

## 2023-12-19 ENCOUNTER — Other Ambulatory Visit: Payer: Self-pay | Admitting: Hematology and Oncology

## 2023-12-19 MED ORDER — ABEMACICLIB 100 MG PO TABS
100.0000 mg | ORAL_TABLET | Freq: Two times a day (BID) | ORAL | 3 refills | Status: DC
  Filled 2023-12-19 (×2): qty 56, 28d supply, fill #0
  Filled 2024-01-13: qty 56, 28d supply, fill #1
  Filled 2024-02-20: qty 56, 28d supply, fill #2
  Filled 2024-03-16: qty 56, 28d supply, fill #3

## 2023-12-19 NOTE — Progress Notes (Signed)
 Specialty Pharmacy Refill Coordination Note  MyChart Questionnaire Submission  Gabriela Sullivan is a 33 y.o. female contacted today regarding refills of specialty medication(s) Verzenio .  Doses on hand: (Patient-Rptd) 3 weeks   Patient requested: (Patient-Rptd) Delivery   Delivery date: 12/23/23   Verified address: (Patient-Rptd) [address] [place], [address]  Medication will be filled on 12/22/23.

## 2023-12-21 NOTE — Assessment & Plan Note (Signed)
 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% 09/25/2020: Right breast biopsy: DCIS grade 1, ER/PR positive 10/06/2020  breast MRI showed a 11 cm area of malignancy, with a second 1.9 cm mass, and skin thickening concerning for inflammatory breast carcinoma; at least 6 abnormal lymph nodes noted 10/13/2020 CT chest shows clear lungs and no obvious liver involvement but multiple lytic bone lesions in addition to the findings and MRI above 10/21/2020 spine MRI: Shows widely scattered thoracic and lumbar vertebral body metastases 10/24/2020: 2 cycles of dose dense Adriamycin and Cytoxan discontinued because of rising LFTs 12/01/2020: MRI abdomen: No evidence of metastatic disease in the liver 01/31/2021:Left mastectomy: Residual multifocal IDC 2 cm with intermediate grade DCIS, 15/15 lymph nodes positive, margins negative, lymphovascular space invasion present, ER 60%, PR 10%, HER2 negative, Ki-67 15% Right mastectomy: Fibrocystic change no residual cancer or DCIS -------------------------------------------------------------------------------------------------------------------------------------------  Current treatment: Anastrozole with goserelin and Verzenio and Zometa (since August 2022)   Anastrozole toxicities: Tolerating it very well   07/01/2022: Stable bone metastases similar 8 mm left axillary lymph node. 12/31/2022: CT CAP: No significant interval change in pulm metastases.  No evidence of new metastatic disease.  8 mm left axillary lymph node stable (stomach bug was in ED with nausea): Now resolved 01/05/2023: Abdominal pain: CT abdomen: Negative, CT head: Negative Hospitalization 01/24/2023-01/28/2023: diarrhea and fever 06/23/2023: CT CAP: Stable examination, similar bone metastases, unchanged 8 mm left axillary lymph node  Based on excellent results on the CT scans we will continue with the same treatment.

## 2023-12-22 ENCOUNTER — Other Ambulatory Visit: Payer: Self-pay

## 2023-12-22 ENCOUNTER — Inpatient Hospital Stay

## 2023-12-22 ENCOUNTER — Ambulatory Visit: Attending: Hematology and Oncology | Admitting: Hematology and Oncology

## 2023-12-22 VITALS — BP 112/61 | HR 64 | Temp 98.0°F | Resp 16 | Ht 60.0 in | Wt 149.7 lb

## 2023-12-22 DIAGNOSIS — Z1732 Human epidermal growth factor receptor 2 negative status: Secondary | ICD-10-CM | POA: Diagnosis not present

## 2023-12-22 DIAGNOSIS — Z1721 Progesterone receptor positive status: Secondary | ICD-10-CM | POA: Diagnosis not present

## 2023-12-22 DIAGNOSIS — Z170 Estrogen receptor positive status [ER+]: Secondary | ICD-10-CM | POA: Insufficient documentation

## 2023-12-22 DIAGNOSIS — C7951 Secondary malignant neoplasm of bone: Secondary | ICD-10-CM | POA: Diagnosis not present

## 2023-12-22 DIAGNOSIS — C50412 Malignant neoplasm of upper-outer quadrant of left female breast: Secondary | ICD-10-CM | POA: Insufficient documentation

## 2023-12-22 DIAGNOSIS — Z5111 Encounter for antineoplastic chemotherapy: Secondary | ICD-10-CM | POA: Diagnosis present

## 2023-12-22 DIAGNOSIS — C7800 Secondary malignant neoplasm of unspecified lung: Secondary | ICD-10-CM | POA: Diagnosis not present

## 2023-12-22 DIAGNOSIS — Z9013 Acquired absence of bilateral breasts and nipples: Secondary | ICD-10-CM | POA: Diagnosis not present

## 2023-12-22 DIAGNOSIS — Z79811 Long term (current) use of aromatase inhibitors: Secondary | ICD-10-CM | POA: Insufficient documentation

## 2023-12-22 DIAGNOSIS — Z95828 Presence of other vascular implants and grafts: Secondary | ICD-10-CM

## 2023-12-22 LAB — CBC WITH DIFFERENTIAL (CANCER CENTER ONLY)
Abs Immature Granulocytes: 0.01 K/uL (ref 0.00–0.07)
Basophils Absolute: 0 K/uL (ref 0.0–0.1)
Basophils Relative: 1 %
Eosinophils Absolute: 0.1 K/uL (ref 0.0–0.5)
Eosinophils Relative: 3 %
HCT: 34.4 % — ABNORMAL LOW (ref 36.0–46.0)
Hemoglobin: 12.4 g/dL (ref 12.0–15.0)
Immature Granulocytes: 0 %
Lymphocytes Relative: 40 %
Lymphs Abs: 1.2 K/uL (ref 0.7–4.0)
MCH: 33.9 pg (ref 26.0–34.0)
MCHC: 36 g/dL (ref 30.0–36.0)
MCV: 94 fL (ref 80.0–100.0)
Monocytes Absolute: 0.2 K/uL (ref 0.1–1.0)
Monocytes Relative: 6 %
Neutro Abs: 1.4 K/uL — ABNORMAL LOW (ref 1.7–7.7)
Neutrophils Relative %: 50 %
Platelet Count: 173 K/uL (ref 150–400)
RBC: 3.66 MIL/uL — ABNORMAL LOW (ref 3.87–5.11)
RDW: 12.6 % (ref 11.5–15.5)
WBC Count: 2.9 K/uL — ABNORMAL LOW (ref 4.0–10.5)
nRBC: 0 % (ref 0.0–0.2)

## 2023-12-22 LAB — CMP (CANCER CENTER ONLY)
ALT: 15 U/L (ref 0–44)
AST: 23 U/L (ref 15–41)
Albumin: 4.3 g/dL (ref 3.5–5.0)
Alkaline Phosphatase: 37 U/L — ABNORMAL LOW (ref 38–126)
Anion gap: 5 (ref 5–15)
BUN: 24 mg/dL — ABNORMAL HIGH (ref 6–20)
CO2: 26 mmol/L (ref 22–32)
Calcium: 9.1 mg/dL (ref 8.9–10.3)
Chloride: 109 mmol/L (ref 98–111)
Creatinine: 0.68 mg/dL (ref 0.44–1.00)
GFR, Estimated: >60 mL/min (ref >60)
Glucose, Bld: 75 mg/dL (ref 70–99)
Potassium: 3.5 mmol/L (ref 3.5–5.1)
Sodium: 140 mmol/L (ref 135–145)
Total Bilirubin: 0.6 mg/dL (ref 0.0–1.2)
Total Protein: 7.3 g/dL (ref 6.5–8.1)

## 2023-12-22 MED ORDER — GOSERELIN ACETATE 3.6 MG ~~LOC~~ IMPL
3.6000 mg | DRUG_IMPLANT | Freq: Once | SUBCUTANEOUS | Status: AC
  Administered 2023-12-22: 3.6 mg via SUBCUTANEOUS
  Filled 2023-12-22: qty 3.6

## 2023-12-22 MED ORDER — ZOLEDRONIC ACID 4 MG/100ML IV SOLN
4.0000 mg | Freq: Once | INTRAVENOUS | Status: AC
  Administered 2023-12-22: 4 mg via INTRAVENOUS
  Filled 2023-12-22: qty 100

## 2023-12-22 NOTE — Progress Notes (Signed)
 Patient Care Team: Honore Sherryle RAMAN, MD as PCP - General (Family Medicine) Tyree Nanetta SAILOR, RN as Oncology Nurse Navigator Glean, Stephane BROCKS, RN (Inactive) as Oncology Nurse Navigator Vanderbilt Ned, MD as Consulting Physician (General Surgery) Dewey Rush, MD as Consulting Physician (Radiation Oncology) Odean Potts, MD as Consulting Physician (Hematology and Oncology)  DIAGNOSIS:  Encounter Diagnosis  Name Primary?   Malignant neoplasm of upper-outer quadrant of left breast in female, estrogen receptor positive (HCC) Yes    SUMMARY OF ONCOLOGIC HISTORY: Oncology History  Ductal carcinoma in situ (DCIS) of right breast  09/29/2020 Initial Diagnosis   Ductal carcinoma in situ (DCIS) of right breast   10/04/2020 Cancer Staging   Staging form: Breast, AJCC 8th Edition - Clinical: Stage 0 (cTis (DCIS), cN0, cM0) - Signed by [name], MD on 10/04/2020   11/25/2020 Genetic Testing   Negative genetic testing:  No pathogenic variants detected on the [employer] CancerNext-Expanded + RNAinsight panel. The report date is 11/25/2020.   The CancerNext-Expanded + RNAinsight gene panel offered by [employer] and includes sequencing and rearrangement analysis for the following 77 genes: AIP, ALK, APC, ATM, AXIN2, BAP1, BARD1, BLM, BMPR1A, BRCA1, BRCA2, BRIP1, CDC73, CDH1, CDK4, CDKN1B, CDKN2A, CHEK2, CTNNA1, DICER1, FANCC, FH, FLCN, GALNT12, KIF1B, LZTR1, MAX, MEN1, MET, MLH1, MSH2, MSH3, MSH6, MUTYH, NBN, NF1, NF2, NTHL1, PALB2, PHOX2B, PMS2, POT1, PRKAR1A, PTCH1, PTEN, RAD51C, RAD51D, RB1, RECQL, RET, SDHA, SDHAF2, SDHB, SDHC, SDHD, SMAD4, SMARCA4, SMARCB1, SMARCE1, STK11, SUFU, TMEM127, TP53, TSC1, TSC2, VHL and XRCC2 (sequencing and deletion/duplication); EGFR, EGLN1, HOXB13, KIT, MITF, PDGFRA, POLD1 and POLE (sequencing only); EPCAM and GREM1 (deletion/duplication only). RNA data is routinely analyzed for use in variant interpretation for all genes.   Malignant neoplasm of upper-outer  quadrant of left breast in female, estrogen receptor positive (HCC)  09/29/2020 Initial Diagnosis   Malignant neoplasm of upper-outer quadrant of left breast in female, estrogen receptor positive (HCC)   10/01/2020 Relapse/Recurrence   Spine MRI: Scattered thoracic and lumbar vertebral body and posterior element metastases   10/25/2020 - 11/10/2020 Chemotherapy   2 cycles of Adriamycin  and Cytoxan .  Chemo discontinued because of rising LFTs.       11/25/2020 Genetic Testing   Negative genetic testing:  No pathogenic variants detected on the [employer] CancerNext-Expanded + RNAinsight panel. The report date is 11/25/2020.   The CancerNext-Expanded + RNAinsight gene panel offered by [employer] and includes sequencing and rearrangement analysis for the following 77 genes: AIP, ALK, APC, ATM, AXIN2, BAP1, BARD1, BLM, BMPR1A, BRCA1, BRCA2, BRIP1, CDC73, CDH1, CDK4, CDKN1B, CDKN2A, CHEK2, CTNNA1, DICER1, FANCC, FH, FLCN, GALNT12, KIF1B, LZTR1, MAX, MEN1, MET, MLH1, MSH2, MSH3, MSH6, MUTYH, NBN, NF1, NF2, NTHL1, PALB2, PHOX2B, PMS2, POT1, PRKAR1A, PTCH1, PTEN, RAD51C, RAD51D, RB1, RECQL, RET, SDHA, SDHAF2, SDHB, SDHC, SDHD, SMAD4, SMARCA4, SMARCB1, SMARCE1, STK11, SUFU, TMEM127, TP53, TSC1, TSC2, VHL and XRCC2 (sequencing and deletion/duplication); EGFR, EGLN1, HOXB13, KIT, MITF, PDGFRA, POLD1 and POLE (sequencing only); EPCAM and GREM1 (deletion/duplication only). RNA data is routinely analyzed for use in variant interpretation for all genes.    Anti-estrogen oral therapy   anastrozole  started 12/06/2020 goserelin started 10/11/2020  Verzinio started Aug 2022 zolendronate started 11/02/2018. Every 12 weeks.   01/31/2021 Surgery   Left mastectomy: Residual multifocal IDC 2 cm with intermediate grade DCIS, 15/15 lymph nodes positive, margins negative, lymphovascular space invasion present, ER 60%, PR 10%, HER2 negative, Ki-67 15% Right mastectomy: Fibrocystic change no residual cancer or DCIS     CHIEF  COMPLIANT: Metastatic  breast cancer follow-up  HISTORY OF PRESENT ILLNESS:   History of Present Illness Gabriela Sullivan is a 33 year old female with metastatic breast cancer previously under very good control with Zoladex  and anastrozole  and Verzenio  who presents for follow-up on her hormonal therapy regimen.  She has not received Zoladex  injections since April due to a lack of notification, leading her to believe the treatment was complete. She continues to take anastrozole  as prescribed. She has PCOS and has not menstruated in years, even before her current treatment. Her ovaries remain intact. She is also taking Verzenio  without issues in obtaining it on time and reports no new or different side effects from her medications.   ALLERGIES:  is allergic to hydrocodone -acetaminophen .  MEDICATIONS:  Current Outpatient Medications  Medication Sig Dispense Refill   abemaciclib  (VERZENIO ) 100 MG tablet Take 1 tablet (100 mg total) by mouth 2 (two) times daily. Swallow tablets whole. Do not chew, crush, or split tablets before swallowing. 56 tablet 3   anastrozole  (ARIMIDEX ) 1 MG tablet Take 1 tablet (1 mg total) by mouth daily. 90 tablet 3   calcium  carbonate (TUMS - DOSED IN MG ELEMENTAL CALCIUM ) 500 MG chewable tablet Chew 2 tablets by mouth every other day.     MOUNJARO 7.5 MG/0.5ML Pen Inject 7.5 mg into the skin once a week.     potassium chloride  (KLOR-CON ) 10 MEQ tablet Take 1 tablet (10 mEq total) by mouth daily. 20 tablet 0   Vitamin D , Ergocalciferol , (DRISDOL ) 1.25 MG (50000 UNIT) CAPS capsule Take 50,000 Units by mouth once a week.     No current facility-administered medications for this visit.    PHYSICAL EXAMINATION: ECOG PERFORMANCE STATUS: 1 - Symptomatic but completely ambulatory  Vitals:   12/22/23 0818  BP: 112/61  Pulse: 64  Resp: 16  Temp: 98 F (36.7 C)  SpO2: 100%   Filed Weights   12/22/23 0818  Weight: 149 lb 11.2 oz (67.9 kg)      LABORATORY DATA:  I have  reviewed the data as listed    Latest Ref Rng & Units 08/29/2023    8:20 AM 07/21/2023   10:10 AM 06/30/2023    7:31 AM  CMP  Glucose 70 - 99 mg/dL 91   898   BUN 6 - 20 mg/dL 20   20   Creatinine 9.55 - 1.00 mg/dL 9.37   9.24   Sodium 864 - 145 mmol/L 140   143   Potassium 3.5 - 5.1 mmol/L 3.7   3.5   Chloride 98 - 111 mmol/L 107   107   CO2 22 - 32 mmol/L 27   30   Calcium  8.9 - 10.3 mg/dL 9.3   9.7   Total Protein 6.5 - 8.1 g/dL 7.6  7.6  7.7   Total Bilirubin 0.0 - 1.2 mg/dL 0.8  1.0  0.7   Alkaline Phos 38 - 126 U/L 46  36  45   AST 15 - 41 U/L 30  33  25   ALT 0 - 44 U/L 21  19  19      Lab Results  Component Value Date   WBC 3.0 (L) 08/29/2023   HGB 12.7 08/29/2023   HCT 35.2 (L) 08/29/2023   MCV 96.2 08/29/2023   PLT 190 08/29/2023   NEUTROABS 1.5 (L) 08/29/2023    ASSESSMENT & PLAN:  Malignant neoplasm of upper-outer quadrant of left breast in female, estrogen receptor positive [facility]) May 2021: 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, 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% 09/25/2020: Right breast biopsy: DCIS grade 1, ER/PR positive 10/06/2020  breast MRI showed a 11 cm area of malignancy, with a second 1.9 cm mass, and skin thickening concerning for inflammatory breast carcinoma; at least 6 abnormal lymph nodes noted 10/13/2020 CT chest shows clear lungs and no obvious liver involvement but multiple lytic bone lesions in addition to the findings and MRI above 10/21/2020 spine MRI: Shows widely scattered thoracic and lumbar vertebral body metastases 10/24/2020: 2 cycles of dose dense Adriamycin  and Cytoxan  discontinued because of rising LFTs 12/01/2020: MRI abdomen: No evidence of metastatic disease in the liver 01/31/2021:Left mastectomy: Residual multifocal IDC 2 cm with intermediate grade DCIS, 15/15 lymph nodes positive, margins negative, lymphovascular space invasion present, ER 60%, PR 10%, HER2 negative, Ki-67 15% Right mastectomy:  Fibrocystic change no residual cancer or DCIS -------------------------------------------------------------------------------------------------------------------------------------------  Current treatment: Anastrozole  with goserelin and Verzenio  and Zometa  (since August 2022)   Anastrozole  toxicities: Tolerating it very well   07/01/2022: Stable bone metastases similar 8 mm left axillary lymph node. 12/31/2022: CT CAP: No significant interval change in pulm metastases.  No evidence of new metastatic disease.  8 mm left axillary lymph node stable (stomach bug was in ED with nausea): Now resolved 01/05/2023: Abdominal pain: CT abdomen: Negative, CT head: Negative Hospitalization 01/24/2023-01/28/2023: diarrhea and fever 06/23/2023: CT CAP: Stable examination, similar bone metastases, unchanged 8 mm left axillary lymph node   Resume Zoladex  injections monthly along with Zometa  infusions every 3 months CT CAP in the next 1 to 2 weeks Telephone call after the scan to discuss results Follow-up every 3 months for labs and injections and infusions.  Monthly for injections.       No orders of the defined types were placed in this encounter.  The patient has a good understanding of the overall plan. she agrees with it. she will call with any problems that may develop before the next visit here. Total time spent: 30 mins including face to face time and time spent for planning, charting and co-ordination of care   Naomi MARLA Chad, MD 12/22/23

## 2023-12-22 NOTE — Patient Instructions (Signed)
 Zoledronic  Acid Injection (Cancer) What is this medication? ZOLEDRONIC  ACID (ZOE le dron ik AS id) treats high calcium levels in the blood caused by cancer. It may also be used with chemotherapy to treat weakened bones caused by cancer. It works by slowing down the release of calcium from bones. This lowers calcium levels in your blood. It also makes your bones stronger and less likely to break (fracture). It belongs to a group of medications called bisphosphonates. This medicine may be used for other purposes; ask your health care provider or pharmacist if you have questions. COMMON BRAND NAME(S): Zometa , Zometa  Powder What should I tell my care team before I take this medication? They need to know if you have any of these conditions: Dehydration Dental disease Kidney disease Liver disease Low levels of calcium in the blood Lung or breathing disease, such as asthma Receiving steroids, such as dexamethasone  or prednisone An unusual or allergic reaction to zoledronic  acid, other medications, foods, dyes, or preservatives Pregnant or trying to get pregnant Breast-feeding How should I use this medication? This medication is injected into a vein. It is given by your care team in a hospital or clinic setting. Talk to your care team about the use of this medication in children. Special care may be needed. Overdosage: If you think you have taken too much of this medicine contact a poison control center or emergency room at once. NOTE: This medicine is only for you. Do not share this medicine with others. What if I miss a dose? Keep appointments for follow-up doses. It is important not to miss your dose. Call your care team if you are unable to keep an appointment. What may interact with this medication? Certain antibiotics given by injection Diuretics, such as bumetanide, furosemide  NSAIDs, medications for pain and inflammation, such as ibuprofen  or naproxen Teriparatide Thalidomide This list  may not describe all possible interactions. Give your health care provider a list of all the medicines, herbs, non-prescription drugs, or dietary supplements you use. Also tell them if you smoke, drink alcohol, or use illegal drugs. Some items may interact with your medicine. What should I watch for while using this medication? Visit your care team for regular checks on your progress. It may be some time before you see the benefit from this medication. Some people who take this medication have severe bone, joint, or muscle pain. This medication may also increase your risk for jaw problems or a broken thigh bone. Tell your care team right away if you have severe pain in your jaw, bones, joints, or muscles. Tell you care team if you have any pain that does not go away or that gets worse. Tell your dentist and dental surgeon that you are taking this medication. You should not have major dental surgery while on this medication. See your dentist to have a dental exam and fix any dental problems before starting this medication. Take good care of your teeth while on this medication. Make sure you see your dentist for regular follow-up appointments. You should make sure you get enough calcium and vitamin D while you are taking this medication. Discuss the foods you eat and the vitamins you take with your care team. Check with your care team if you have severe diarrhea, nausea, and vomiting, or if you sweat a lot. The loss of too much body fluid may make it dangerous for you to take this medication. You may need bloodwork while taking this medication. Talk to your care team if  you wish to become pregnant or think you might be pregnant. This medication can cause serious birth defects. What side effects may I notice from receiving this medication? Side effects that you should report to your care team as soon as possible: Allergic reactions--skin rash, itching, hives, swelling of the face, lips, tongue, or  throat Kidney injury--decrease in the amount of urine, swelling of the ankles, hands, or feet Low calcium level--muscle pain or cramps, confusion, tingling, or numbness in the hands or feet Osteonecrosis of the jaw--pain, swelling, or redness in the mouth, numbness of the jaw, poor healing after dental work, unusual discharge from the mouth, visible bones in the mouth Severe bone, joint, or muscle pain Side effects that usually do not require medical attention (report to your care team if they continue or are bothersome): Constipation Fatigue Fever Loss of appetite Nausea Stomach pain This list may not describe all possible side effects. Call your doctor for medical advice about side effects. You may report side effects to FDA at 1-800-FDA-1088. Where should I keep my medication? This medication is given in a hospital or clinic. It will not be stored at home. NOTE: This sheet is a summary. It may not cover all possible information. If you have questions about this medicine, talk to your doctor, pharmacist, or health care provider.  Goserelin Implant What is this medication? GOSERELIN (GOE se rel in) treats prostate cancer and breast cancer. It works by decreasing levels of the hormones testosterone and estrogen in the body. This prevents prostate and breast cancer cells from spreading or growing. It may also be used to treat endometriosis. This is a condition where the tissue that lines the uterus grows outside the uterus. It works by decreasing the amount of estrogen your body makes, which reduces heavy bleeding and pain. It can also be used to help thin the lining of the uterus before a surgery used to prevent or reduce heavy periods. This medicine may be used for other purposes; ask your health care provider or pharmacist if you have questions. COMMON BRAND NAME(S): Zoladex , Zoladex  65-Month What should I tell my care team before I take this medication? They need to know if you have any of  these conditions: Bone problems Diabetes Heart disease History of irregular heartbeat or rhythm An unusual or allergic reaction to goserelin, other medications, foods, dyes, or preservatives Pregnant or trying to get pregnant Breastfeeding How should I use this medication? This medication is injected under the skin. It is given by your care team in a hospital or clinic setting. Talk to your care team about the use of this medication in children. Special care may be needed. Overdosage: If you think you have taken too much of this medicine contact a poison control center or emergency room at once. NOTE: This medicine is only for you. Do not share this medicine with others. What if I miss a dose? Keep appointments for follow-up doses. It is important not to miss your dose. Call your care team if you are unable to keep an appointment. What may interact with this medication? Do not take this medication with any of the following: Cisapride Dronedarone Pimozide Thioridazine This medication may also interact with the following: Other medications that cause heart rhythm changes This list may not describe all possible interactions. Give your health care provider a list of all the medicines, herbs, non-prescription drugs, or dietary supplements you use. Also tell them if you smoke, drink alcohol, or use illegal drugs. Some items  may interact with your medicine. What should I watch for while using this medication? Visit your care team for regular checks on your progress. Your symptoms may appear to get worse during the first weeks of this therapy. Tell your care team if your symptoms do not start to get better or if they get worse after this time. Using this medication for a long time may weaken your bones. If you smoke or frequently drink alcohol you may increase your risk of bone loss. A family history of osteoporosis, chronic use of medications for seizures (convulsions), or corticosteroids can also  increase your risk of bone loss. The risk of bone fractures may be increased. Talk to your care team about your bone health. This medication may increase blood sugar. The risk may be higher in patients who already have diabetes. Ask your care team what you can do to lower your risk of diabetes while taking this medication. This medication should stop regular monthly menstruation in women. Tell your care team if you continue to menstruate. Talk to your care team if you wish to become pregnant or think you might be pregnant. This medication can cause serious birth defects if taken during pregnancy or for 12 weeks after stopping treatment. Talk to your care team about reliable forms of contraception. Do not breastfeed while taking this medication. This medication may cause infertility. Talk to your care team if you are concerned about your fertility. What side effects may I notice from receiving this medication? Side effects that you should report to your care team as soon as possible: Allergic reactions--skin rash, itching, hives, swelling of the face, lips, tongue, or throat Change in the amount of urine Heart attack--pain or tightness in the chest, shoulders, arms, or jaw, nausea, shortness of breath, cold or clammy skin, feeling faint or lightheaded Heart rhythm changes--fast or irregular heartbeat, dizziness, feeling faint or lightheaded, chest pain, trouble breathing High blood sugar (hyperglycemia)--increased thirst or amount of urine, unusual weakness or fatigue, blurry vision High calcium level--increased thirst or amount of urine, nausea, vomiting, confusion, unusual weakness or fatigue, bone pain Pain, redness, irritation, or bruising at the injection site Severe back pain, numbness or weakness of the hands, arms, legs, or feet, loss of coordination, loss of bowel or bladder control Stroke--sudden numbness or weakness of the face, arm, or leg, trouble speaking, confusion, trouble walking,  loss of balance or coordination, dizziness, severe headache, change in vision Swelling and pain of the tumor site or lymph nodes Trouble passing urine Side effects that usually do not require medical attention (report to your care team if they continue or are bothersome): Change in sex drive or performance Headache Hot flashes Rapid or extreme change in emotion or mood Sweating Swelling of the ankles, hands, or feet Unusual vaginal discharge, itching, or odor This list may not describe all possible side effects. Call your doctor for medical advice about side effects. You may report side effects to FDA at 1-800-FDA-1088. Where should I keep my medication? This medication is given in a hospital or clinic. It will not be stored at home. NOTE: This sheet is a summary. It may not cover all possible information. If you have questions about this medicine, talk to your doctor, pharmacist, or health care provider.  2024 [employer]vier/Gold Standard (2021-10-11 00:00:00)  2024 [employer]/Gold Standard (2021-07-13 00:00:00)

## 2023-12-23 LAB — CANCER ANTIGEN 27.29: CA 27.29: 15 U/mL (ref 0.0–38.6)

## 2023-12-29 ENCOUNTER — Emergency Department (HOSPITAL_COMMUNITY)
Admission: EM | Admit: 2023-12-29 | Discharge: 2023-12-29 | Disposition: A | Discharge status: Home / Self Care | Attending: Emergency Medicine | Admitting: Emergency Medicine

## 2023-12-29 DIAGNOSIS — Z853 Personal history of malignant neoplasm of breast: Secondary | ICD-10-CM | POA: Insufficient documentation

## 2023-12-29 DIAGNOSIS — M79674 Pain in right toe(s): Secondary | ICD-10-CM | POA: Diagnosis present

## 2023-12-29 DIAGNOSIS — L600 Ingrowing nail: Secondary | ICD-10-CM | POA: Diagnosis not present

## 2023-12-29 MED ORDER — DOXYCYCLINE HYCLATE 100 MG PO CAPS: 100.0000 mg | ORAL_CAPSULE | Freq: Two times a day (BID) | ORAL | 0 refills | Status: DC

## 2023-12-29 NOTE — ED Provider Notes (Signed)
 [place] EMERGENCY DEPARTMENT AT [facility] Provider Note   CSN: [id number] Arrival date & time: 12/29/23  2015     Patient presents with: Nail Problem   Gabriela Sullivan is a 33 y.o. female.   33 year old female with a history of breast cancer remission who presents to the emergency department due to toe pain.  Patient reports on Friday she started experiencing some pain and redness of her right second toe.  Denies any drainage.  No fevers or chills.  Has not tried soaking it or any other treatments but wanted to be evaluated today.       Prior to Admission medications   Medication Sig Start Date End Date Taking? Authorizing Provider  doxycycline  (VIBRAMYCIN ) 100 MG capsule Take 1 capsule (100 mg total) by mouth 2 (two) times daily. 12/29/23  Yes Yolande Lamar BROCKS, MD  abemaciclib  (VERZENIO ) 100 MG tablet Take 1 tablet (100 mg total) by mouth 2 (two) times daily. Swallow tablets whole. Do not chew, crush, or split tablets before swallowing. 12/19/23   Gudena, Vinay, MD  anastrozole  (ARIMIDEX ) 1 MG tablet Take 1 tablet (1 mg total) by mouth daily. 04/24/23   Gudena, Vinay, MD  calcium  carbonate (TUMS - DOSED IN MG ELEMENTAL CALCIUM ) 500 MG chewable tablet Chew 2 tablets by mouth every other day.    [provider]  MOUNJARO 7.5 MG/0.5ML Pen Inject 7.5 mg into the skin once a week. 07/25/23   [provider]  potassium chloride  (KLOR-CON ) 10 MEQ tablet Take 1 tablet (10 mEq total) by mouth daily. 01/05/23   Prosperi, Christian H, PA-C  Vitamin D , Ergocalciferol , (DRISDOL ) 1.25 MG (50000 UNIT) CAPS capsule Take 50,000 Units by mouth once a week. 06/20/22   [provider]  prochlorperazine  (COMPAZINE ) 10 MG tablet Take 1 tablet (10 mg total) by mouth every 6 (six) hours as needed (Nausea or vomiting). 10/04/20 12/21/20  Magrinat, Sandria BROCKS, MD    Allergies: Hydrocodone -acetaminophen     Review of Systems  Updated Vital Signs BP 130/77   Pulse 82   Temp 98  F (36.7 C)   Resp 18   SpO2 100%   Physical Exam Musculoskeletal:     Comments: Swelling to the medial aspect of the right second toe.  No purulence expressed from it or drainage noted.     (all labs ordered are listed, but only abnormal results are displayed) Labs Reviewed - No data to display  EKG: None  Radiology: No results found.   Procedures   Medications Ordered in the ED - No data to display                                  Medical Decision Making Risk Prescription drug management.   Gabriela Sullivan is a 33 y.o. female with comorbidities that complicate the patient evaluation including breast cancer admission  Initial Ddx:  Ingrown toenail, cellulitis, paronychia  MDM/Course:  Patient presents emergency department with ingrown toenail.  Does appear to have some erythema around it.  No purulence was noted.  No fluctuance either.  Discussed options for treatment which included nail removal but patient prefers to have antibiotics and conservative management with Epsom salts and podiatry follow-up at this time.  Will have her follow-up with her primary care doctor in several days as well in case she cannot get podiatry follow-up in a timely fashion.  Return precautions discussed prior to discharge.  This  patient presents to the ED for concern of complaints listed in HPI, this involves an extensive number of treatment options, and is a complaint that carries with it a high risk of complications and morbidity. Disposition including potential need for admission considered.   Dispo: DC Home. Return precautions discussed including, but not limited to, those listed in the AVS. Allowed pt time to ask questions which were answered fully prior to dc.  Records reviewed Outpatient Clinic Notes I have reviewed the patients home medications and made adjustments as needed  Portions of this note were generated with Dragon dictation software. Dictation errors may occur despite best  attempts at proofreading.     Final diagnoses:  Ingrown toenail    ED Discharge Orders          Ordered    doxycycline  (VIBRAMYCIN ) 100 MG capsule  2 times daily        12/29/23 2114               Yolande Lamar BROCKS, MD 12/29/23 2119

## 2023-12-29 NOTE — ED Triage Notes (Signed)
 Patient states she had an ingrown toenail on her second toe of the right foot and on Friday attempted to cut it down. Area around nail is now red and swollen.

## 2023-12-29 NOTE — Discharge Instructions (Addendum)
 You were seen for your ingrown toenail in the emergency department.   At home, please take the antibiotics and perform Epson salt soaks.    Check your MyChart online for the results of any tests that had not resulted by the time you left the emergency department.   Follow-up with your primary doctor in 2-3 days regarding your visit.  Please also try to schedule podiatry follow-up in 1 week.  Return immediately to the emergency department if you experience any of the following: Worsening pain, fevers, redness spreading up your leg or foot, or any other concerning symptoms.    Thank you for visiting our Emergency Department. It was a pleasure taking care of you today.

## 2024-01-05 ENCOUNTER — Other Ambulatory Visit: Payer: Self-pay | Admitting: Medical Genetics

## 2024-01-05 ENCOUNTER — Ambulatory Visit (HOSPITAL_COMMUNITY)
Admission: RE | Admit: 2024-01-05 | Discharge: 2024-01-05 | Disposition: A | Discharge status: Home / Self Care | Source: Ambulatory Visit | Attending: Hematology and Oncology | Admitting: Hematology and Oncology

## 2024-01-05 DIAGNOSIS — Z170 Estrogen receptor positive status [ER+]: Secondary | ICD-10-CM | POA: Diagnosis present

## 2024-01-05 DIAGNOSIS — C50412 Malignant neoplasm of upper-outer quadrant of left female breast: Secondary | ICD-10-CM | POA: Diagnosis present

## 2024-01-05 MED ORDER — HEPARIN SOD (PORK) LOCK FLUSH 100 UNIT/ML IV SOLN
500.0000 [IU] | Freq: Once | INTRAVENOUS | Status: AC
  Administered 2024-01-05: 500 [IU] via INTRAVENOUS

## 2024-01-05 MED ORDER — IOHEXOL 300 MG/ML  SOLN
100.0000 mL | Freq: Once | INTRAMUSCULAR | Status: AC | PRN
  Administered 2024-01-05: 100 mL via INTRAVENOUS

## 2024-01-12 ENCOUNTER — Encounter: Payer: Self-pay | Admitting: Adult Health

## 2024-01-12 ENCOUNTER — Ambulatory Visit: Payer: Medicaid Other | Attending: Surgery

## 2024-01-12 ENCOUNTER — Other Ambulatory Visit (HOSPITAL_COMMUNITY): Payer: Self-pay

## 2024-01-12 ENCOUNTER — Inpatient Hospital Stay: Attending: Hematology and Oncology | Admitting: Adult Health

## 2024-01-12 ENCOUNTER — Encounter (INDEPENDENT_AMBULATORY_CARE_PROVIDER_SITE_OTHER): Payer: Self-pay

## 2024-01-12 VITALS — Wt 147.4 lb

## 2024-01-12 DIAGNOSIS — Z803 Family history of malignant neoplasm of breast: Secondary | ICD-10-CM

## 2024-01-12 DIAGNOSIS — Z170 Estrogen receptor positive status [ER+]: Secondary | ICD-10-CM | POA: Diagnosis not present

## 2024-01-12 DIAGNOSIS — C7951 Secondary malignant neoplasm of bone: Secondary | ICD-10-CM | POA: Diagnosis not present

## 2024-01-12 DIAGNOSIS — Z800 Family history of malignant neoplasm of digestive organs: Secondary | ICD-10-CM

## 2024-01-12 DIAGNOSIS — Z9013 Acquired absence of bilateral breasts and nipples: Secondary | ICD-10-CM | POA: Insufficient documentation

## 2024-01-12 DIAGNOSIS — C50412 Malignant neoplasm of upper-outer quadrant of left female breast: Secondary | ICD-10-CM | POA: Insufficient documentation

## 2024-01-12 DIAGNOSIS — Z483 Aftercare following surgery for neoplasm: Secondary | ICD-10-CM | POA: Insufficient documentation

## 2024-01-12 DIAGNOSIS — Z1721 Progesterone receptor positive status: Secondary | ICD-10-CM | POA: Insufficient documentation

## 2024-01-12 DIAGNOSIS — Z8041 Family history of malignant neoplasm of ovary: Secondary | ICD-10-CM

## 2024-01-12 DIAGNOSIS — Z79811 Long term (current) use of aromatase inhibitors: Secondary | ICD-10-CM | POA: Insufficient documentation

## 2024-01-12 DIAGNOSIS — Z5111 Encounter for antineoplastic chemotherapy: Secondary | ICD-10-CM | POA: Insufficient documentation

## 2024-01-12 DIAGNOSIS — Z1732 Human epidermal growth factor receptor 2 negative status: Secondary | ICD-10-CM | POA: Insufficient documentation

## 2024-01-12 DIAGNOSIS — Z8042 Family history of malignant neoplasm of prostate: Secondary | ICD-10-CM

## 2024-01-12 NOTE — Progress Notes (Signed)
 [facility] Cancer Follow up:    McCuddy, Michaela S, MD [address] [place] [address]   DIAGNOSIS:  Cancer Staging  Ductal carcinoma in situ (DCIS) of right breast Staging form: Breast, AJCC 8th Edition - Clinical: Stage 0 (cTis (DCIS), cN0, cM0) - Signed by [name], MD on 10/04/2020  Malignant neoplasm of upper-outer quadrant of left breast in female, estrogen receptor positive (HCC) Staging form: Breast, AJCC 8th Edition - Clinical stage from 10/04/2020: Stage IIA (cT3, cN1(f), cM0, G2, ER+, PR+, HER2-) - Signed by [name], MD on 10/04/2020 Stage prefix: Initial diagnosis Method of lymph node assessment: Core biopsy Histologic grading system: 3 grade system  I connected with Inocente Duos  on 01/12/24 at 10:40 AM EDT by telephone and verified that I am speaking with the correct person using two identifiers.  I discussed the limitations, risks, security and privacy concerns of performing an evaluation and management service by telephone and the availability of in person appointments.  I also discussed with the patient that there may be a patient responsible charge related to this service. The patient expressed understanding and agreed to proceed.  Patient location: home Provider location: [facility] office  SUMMARY OF ONCOLOGIC HISTORY: Oncology History  Ductal carcinoma in situ (DCIS) of right breast  09/29/2020 Initial Diagnosis   Ductal carcinoma in situ (DCIS) of right breast   10/04/2020 Cancer Staging   Staging form: Breast, AJCC 8th Edition - Clinical: Stage 0 (cTis (DCIS), cN0, cM0) - Signed by [name], MD on 10/04/2020   11/25/2020 Genetic Testing   Negative genetic testing:  No pathogenic variants detected on the [employer] CancerNext-Expanded + RNAinsight panel. The report date is 11/25/2020.   The CancerNext-Expanded + RNAinsight gene panel offered by [employer] and includes sequencing and rearrangement analysis for the following  77 genes: AIP, ALK, APC, ATM, AXIN2, BAP1, BARD1, BLM, BMPR1A, BRCA1, BRCA2, BRIP1, CDC73, CDH1, CDK4, CDKN1B, CDKN2A, CHEK2, CTNNA1, DICER1, FANCC, FH, FLCN, GALNT12, KIF1B, LZTR1, MAX, MEN1, MET, MLH1, MSH2, MSH3, MSH6, MUTYH, NBN, NF1, NF2, NTHL1, PALB2, PHOX2B, PMS2, POT1, PRKAR1A, PTCH1, PTEN, RAD51C, RAD51D, RB1, RECQL, RET, SDHA, SDHAF2, SDHB, SDHC, SDHD, SMAD4, SMARCA4, SMARCB1, SMARCE1, STK11, SUFU, TMEM127, TP53, TSC1, TSC2, VHL and XRCC2 (sequencing and deletion/duplication); EGFR, EGLN1, HOXB13, KIT, MITF, PDGFRA, POLD1 and POLE (sequencing only); EPCAM and GREM1 (deletion/duplication only). RNA data is routinely analyzed for use in variant interpretation for all genes.   Malignant neoplasm of upper-outer quadrant of left breast in female, estrogen receptor positive (HCC)  09/29/2020 Initial Diagnosis   Malignant neoplasm of upper-outer quadrant of left breast in female, estrogen receptor positive (HCC)   10/01/2020 Relapse/Recurrence   Spine MRI: Scattered thoracic and lumbar vertebral body and posterior element metastases   10/25/2020 - 11/10/2020 Chemotherapy   2 cycles of Adriamycin  and Cytoxan .  Chemo discontinued because of rising LFTs.       11/25/2020 Genetic Testing   Negative genetic testing:  No pathogenic variants detected on the [employer] CancerNext-Expanded + RNAinsight panel. The report date is 11/25/2020.   The CancerNext-Expanded + RNAinsight gene panel offered by [employer] and includes sequencing and rearrangement analysis for the following 77 genes: AIP, ALK, APC, ATM, AXIN2, BAP1, BARD1, BLM, BMPR1A, BRCA1, BRCA2, BRIP1, CDC73, CDH1, CDK4, CDKN1B, CDKN2A, CHEK2, CTNNA1, DICER1, FANCC, FH, FLCN, GALNT12, KIF1B, LZTR1, MAX, MEN1, MET, MLH1, MSH2, MSH3, MSH6, MUTYH, NBN, NF1, NF2, NTHL1, PALB2, PHOX2B, PMS2, POT1, PRKAR1A, PTCH1, PTEN, RAD51C, RAD51D, RB1, RECQL,  RET, SDHA, SDHAF2, SDHB, SDHC, SDHD, SMAD4, SMARCA4, SMARCB1, SMARCE1, STK11, SUFU, TMEM127, TP53, TSC1, TSC2, VHL  and XRCC2 (sequencing and deletion/duplication); EGFR, EGLN1, HOXB13, KIT, MITF, PDGFRA, POLD1 and POLE (sequencing only); EPCAM and GREM1 (deletion/duplication only). RNA data is routinely analyzed for use in variant interpretation for all genes.    Anti-estrogen oral therapy   anastrozole  started 12/06/2020 goserelin started 10/11/2020  Verzinio started Aug 2022 zolendronate started 11/02/2018. Every 12 weeks.   01/31/2021 Surgery   Left mastectomy: Residual multifocal IDC 2 cm with intermediate grade DCIS, 15/15 lymph nodes positive, margins negative, lymphovascular space invasion present, ER 60%, PR 10%, HER2 negative, Ki-67 15% Right mastectomy: Fibrocystic change no residual cancer or DCIS     CURRENT THERAPY: anastrozole /Verzenio   INTERVAL HISTORY:  Discussed the use of AI scribe software for clinical note transcription with the patient, who gave verbal consent to proceed.  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  She has ER, PR positive metastatic disease involving the spine and is currently on anastrozole  and Verzenio , with good tolerance. She experiences intermittent hot flashes but no significant side effects such as diarrhea or fatigue. She is receiving Zoladex  injections, with the next dose scheduled for August 21st. She reports no new issues such as pain, cough, shortness of breath, fevers, or chills. A recent CT of the chest, abdomen, and pelvis was performed.     Patient Active Problem List   Diagnosis Date Noted   Sepsis (HCC) 01/10/2023   Pancytopenia (HCC) 01/10/2023   Febrile neutropenia (HCC) 01/10/2023   Hypokalemia 01/10/2023   Hyponatremia 01/10/2023   Hypocalcemia 01/10/2023   Hyperbilirubinemia 01/10/2023   Elevated lipase 01/10/2023   Nausea, vomiting and diarrhea 01/10/2023   Transaminitis 01/10/2023   Nonalcoholic steatohepatitis  06/21/2022   Type 2 diabetes mellitus (HCC) 06/20/2022   History of therapeutic radiation 11/07/2021   Left breast cancer with T3 tumor, >5 cm in greatest dimension (HCC) 01/31/2021   Malignant neoplasm metastatic to bone (HCC) 12/28/2020   Port-A-Cath in place 12/06/2020   Genetic testing 11/25/2020   Goals of care, counseling/discussion 10/25/2020   Lytic bone lesions on xray 10/16/2020   Family history of ovarian cancer    Family history of prostate cancer    Family history of breast cancer    Family history of stomach cancer    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  MEDICAL HISTORY: Past Medical History:  Diagnosis Date   Asthma    remote   Breast cancer (HCC)    Elevated liver enzymes    Family history of breast cancer    Family history of ovarian cancer    Family history of prostate cancer    Family history of stomach cancer    Hepatic steatosis    Hyperglycemia    A1c 7.6% 12/19/20   Migraines    PCOS (polycystic ovarian syndrome)    Pre-diabetes     SURGICAL HISTORY: Past Surgical History:  Procedure Laterality Date   MASTECTOMY W/ SENTINEL NODE BIOPSY Right 01/31/2021   Procedure: RIGHT SIMPLE MASTECTOMY;  Surgeon: Vanderbilt Ned, MD;  Location: [facility] OR;  Service: General;  Laterality: Right;   MODIFIED MASTECTOMY Left 01/31/2021   Procedure:  LEFT MODIFIED RADICAL MASTECTOMY;  Surgeon: Vanderbilt Ned, MD;  Location: [facility] OR;  Service: General;  Laterality: Left;   NO PAST SURGERIES     PORTACATH PLACEMENT Right 10/18/2020   Procedure: INSERTION PORT-A-CATH;  Surgeon: Vanderbilt Ned, MD;  Location: [facility];  Service: General;  Laterality: Right;    SOCIAL HISTORY: Social History   Socioeconomic History   Marital status: Single    Spouse name: Not  on file   Number of children: 0   Years of education: Not on file   Highest education level: Not on file  Occupational History   Occupation: Scientist, forensic  Tobacco Use   Smoking status: Never   Smokeless tobacco: Never  Vaping Use   Vaping status: Never Used  Substance and Sexual Activity   Alcohol use: Never   Drug use: Never   Sexual activity: Not on file  Other Topics Concern   Not on file  Social History Narrative   Not on file   Social Drivers of Health   Financial Resource Strain: Not on file  Food Insecurity: No Food Insecurity (01/25/2023)   Hunger Vital Sign    Worried About Running Out of Food in the Last Year: Never true    Ran Out of Food in the Last Year: Never true  Transportation Needs: No Transportation Needs (01/25/2023)   PRAPARE - Administrator, Civil Service (Medical): No    Lack of Transportation (Non-Medical): No  Physical Activity: Not on file  Stress: Not on file  Social Connections: Not on file  Intimate Partner Violence: Not At Risk (01/25/2023)   Humiliation, Afraid, Rape, and Kick questionnaire    Fear of Current or Ex-Partner: No    Emotionally Abused: No    Physically Abused: No    Sexually Abused: No    FAMILY HISTORY: Family History  Problem Relation Age of Onset   Ovarian cancer Maternal Grandmother        unknown age of diagnosis   Prostate cancer Maternal Grandfather        metastatic   Breast cancer Cousin        unknown age of diagnosis, maternal first cousin   Stomach cancer Cousin        dx 20s/30s, maternal first cousin    Review of Systems  Constitutional:  Negative for appetite change, chills, fatigue, fever and unexpected weight change.  HENT:   Negative for hearing loss, lump/mass and trouble swallowing.   Eyes:  Negative for eye problems and icterus.  Respiratory:  Negative for chest tightness, cough and shortness of breath.   Cardiovascular:  Negative for chest pain, leg swelling and palpitations.   Gastrointestinal:  Negative for abdominal distention, abdominal pain, constipation, diarrhea, nausea and vomiting.  Endocrine: Positive for hot flashes.  Genitourinary:  Negative for difficulty urinating.   Musculoskeletal:  Negative for arthralgias.  Skin:  Negative for itching and rash.  Neurological:  Negative for dizziness, extremity weakness, headaches and numbness.  Hematological:  Negative for adenopathy. Does not bruise/bleed easily.  Psychiatric/Behavioral:  Negative for depression. The patient is not nervous/anxious.       PHYSICAL EXAMINATION Patient sounds well, in no apparent distress, mood and behavior are normal.  Speech is normal.     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 of 10 to  15% 09/25/2020: Right breast biopsy: DCIS grade 1, ER/PR positive 10/06/2020  breast MRI showed a 11 cm area of malignancy, with a second 1.9 cm mass, and skin thickening concerning for inflammatory breast carcinoma; at least 6 abnormal lymph nodes noted 10/13/2020 CT chest shows clear lungs and no obvious liver involvement but multiple lytic bone lesions in addition to the findings and MRI above 10/21/2020 spine MRI: Shows widely scattered thoracic and lumbar vertebral body metastases 10/24/2020: 2 cycles of dose dense Adriamycin  and Cytoxan  discontinued because of rising LFTs 12/01/2020: MRI abdomen: No evidence of metastatic disease in the liver 01/31/2021:Left mastectomy: Residual multifocal IDC 2 cm with intermediate grade DCIS, 15/15 lymph nodes positive, margins negative, lymphovascular space invasion present, ER 60%, PR 10%, HER2 negative, Ki-67 15% Right mastectomy: Fibrocystic change no residual cancer or  DCIS -------------------------------------------------------------------------------------------------------------------------------------------   07/01/2022: Stable bone metastases similar 8 mm left axillary lymph node. 12/31/2022: CT CAP: No significant interval change in pulm metastases.  No evidence of new metastatic disease.  8 mm left axillary lymph node stable (stomach bug was in ED with nausea): Now resolved 01/05/2023: Abdominal pain: CT abdomen: Negative, CT head: Negative Hospitalization 01/24/2023-01/28/2023: diarrhea and fever 06/23/2023: CT CAP: Stable examination, similar bone metastases, unchanged 8 mm left axillary lymph node Current treatment: Anastrozole  with goserelin and Verzenio  and Zometa  (since August 2022)  Assessment and Plan Assessment & Plan Metastatic ER/PR-positive left breast invasive ductal carcinoma with osseous metastases ER/PR-positive left breast invasive ductal carcinoma with osseous metastases, well-managed on current treatment regimen. Recent CT shows treated osseous metastatic disease with no new metastases. Experiencing intermittent hot flashes as a side effect. - Continue anastrozole  and Verzenio . - Administer Zoladex  on August 21. - Scans show stable osseous metastases, no evidence of progression.  Reviewed with patient in detail.    RTC as scheduled every 4 weeks for Zoladex , will see Dr. Gudena in October prior to Zoladex  injection.   The patient was provided an opportunity to ask questions and all were answered. The patient agreed with the plan and demonstrated an understanding of the instructions.   The patient was advised to call back or seek an in-person evaluation if the symptoms worsen or if the condition fails to improve as anticipated.   I provided 10 minutes of non face-to-face telephone visit time during this encounter, and > 50% was spent counseling as documented under my assessment & plan.  Morna Kendall, NP 01/12/24 11:43 AM Medical  Oncology and Hematology [facility] [address] [place], [place] [address] Tel. [phone]    Fax. [phone]  *Total Encounter Time as defined by the Centers for Medicare and Medicaid Services includes, in addition to the face-to-face time of a patient visit (documented in the note above) non-face-to-face time: obtaining and reviewing outside history, ordering and reviewing medications, tests or procedures, care coordination (communications with other health care professionals or caregivers) and documentation in the medical record.

## 2024-01-12 NOTE — Therapy (Signed)
 OUTPATIENT PHYSICAL THERAPY SOZO SCREENING NOTE   Patient Name: Gabriela Sullivan MRN: [id number] DOB:05/05/1991, 33 y.o., female Today's Date: 01/12/2024  PCP: Honore Sherryle RAMAN, MD REFERRING PROVIDER: Vanderbilt Ned, MD   PT End of Session - 01/12/24 0850     Visit Number 6   # unchanged due to screen only   PT Start Time 0848    PT Stop Time 0852    PT Time Calculation (min) 4 min    Activity Tolerance Patient tolerated treatment well    Behavior During Therapy [facility] for tasks assessed/performed          Past Medical History:  Diagnosis Date   Asthma    remote   Breast cancer (HCC)    Elevated liver enzymes    Family history of breast cancer    Family history of ovarian cancer    Family history of prostate cancer    Family history of stomach cancer    Hepatic steatosis    Hyperglycemia    A1c 7.6% 12/19/20   Migraines    PCOS (polycystic ovarian syndrome)    Pre-diabetes    Past Surgical History:  Procedure Laterality Date   MASTECTOMY W/ SENTINEL NODE BIOPSY Right 01/31/2021   Procedure: RIGHT SIMPLE MASTECTOMY;  Surgeon: Vanderbilt Ned, MD;  Location: [facility] OR;  Service: General;  Laterality: Right;   MODIFIED MASTECTOMY Left 01/31/2021   Procedure: LEFT MODIFIED RADICAL MASTECTOMY;  Surgeon: Vanderbilt Ned, MD;  Location: [facility] OR;  Service: General;  Laterality: Left;   NO PAST SURGERIES     PORTACATH PLACEMENT Right 10/18/2020   Procedure: INSERTION PORT-A-CATH;  Surgeon: Vanderbilt Ned, MD;  Location: [facility];  Service: General;  Laterality: Right;   Patient Active Problem List   Diagnosis Date Noted   Sepsis (HCC) 01/10/2023   Pancytopenia (HCC) 01/10/2023   Febrile neutropenia (HCC) 01/10/2023   Hypokalemia 01/10/2023   Hyponatremia 01/10/2023   Hypocalcemia 01/10/2023   Hyperbilirubinemia 01/10/2023   Elevated lipase 01/10/2023   Nausea, vomiting and diarrhea 01/10/2023   Transaminitis 01/10/2023   Nonalcoholic steatohepatitis 06/21/2022    Type 2 diabetes mellitus (HCC) 06/20/2022   History of therapeutic radiation 11/07/2021   Left breast cancer with T3 tumor, >5 cm in greatest dimension (HCC) 01/31/2021   Malignant neoplasm metastatic to bone (HCC) 12/28/2020   Port-A-Cath in place 12/06/2020   Genetic testing 11/25/2020   Goals of care, counseling/discussion 10/25/2020   Lytic bone lesions on xray 10/16/2020   Family history of ovarian cancer    Family history of prostate cancer    Family history of breast cancer    Family history of stomach cancer    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   Malignant neoplasm of upper-outer quadrant of left breast in female, estrogen receptor positive (HCC) 09/29/2020   Elevated LFTs     REFERRING DIAG: right breast cancer at risk for lymphedema  THERAPY DIAG:  Aftercare following surgery for neoplasm  PERTINENT HISTORY: Patient was diagnosed on 09/26/2020 with 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 It is ER/PR positive and HER2 negative with a Ki67 of 15%. She underwent neoadjuvant chemotherapy beginning 10/24/2020 for 2 rounds which was stopped due to elevated LFTs. She had a bilateral mastectomy with left axillary node dissection on 01/31/2021 with 15/15 nodes positive. She started Anastrozole  on 12/06/2020. 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   PRECAUTIONS: right UE Lymphedema  risk, None  SUBJECTIVE: Pt returns for her 6 month L-Dex screen. My back just hurts me when I overdo it on my good days. Other than that, after losing over 20 lbs, I feel really good.  PAIN:  Are you having pain? No  SOZO SCREENING: Patient was assessed today using the SOZO machine to determine the lymphedema index score. This was compared to her baseline score. It was determined that she is within the recommended range when compared to her baseline and no further action is needed at this time. She will  continue SOZO screenings. These are done every 3 months for 2 years post operatively followed by every 6 months for 2 years, and then annually.   L-DEX FLOWSHEETS - 01/12/24 0800       L-DEX LYMPHEDEMA SCREENING   Measurement Type Unilateral    L-DEX MEASUREMENT EXTREMITY Upper Extremity    POSITION  Standing    DOMINANT SIDE Right    At Risk Side Left    BASELINE SCORE (UNILATERAL) 2.9    L-DEX SCORE (UNILATERAL) 5.1    VALUE CHANGE (UNILAT) 2.2            Aden Berwyn Caldron, PTA 01/12/2024, 8:51 AM

## 2024-01-12 NOTE — Assessment & Plan Note (Addendum)
 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% 09/25/2020: Right breast biopsy: DCIS grade 1, ER/PR positive 10/06/2020  breast MRI showed a 11 cm area of malignancy, with a second 1.9 cm mass, and skin thickening concerning for inflammatory breast carcinoma; at least 6 abnormal lymph nodes noted 10/13/2020 CT chest shows clear lungs and no obvious liver involvement but multiple lytic bone lesions in addition to the findings and MRI above 10/21/2020 spine MRI: Shows widely scattered thoracic and lumbar vertebral body metastases 10/24/2020: 2 cycles of dose dense Adriamycin  and Cytoxan  discontinued because of rising LFTs 12/01/2020: MRI abdomen: No evidence of metastatic disease in the liver 01/31/2021:Left mastectomy: Residual multifocal IDC 2 cm with intermediate grade DCIS, 15/15 lymph nodes positive, margins negative, lymphovascular space invasion present, ER 60%, PR 10%, HER2 negative, Ki-67 15% Right mastectomy: Fibrocystic change no residual cancer or DCIS -------------------------------------------------------------------------------------------------------------------------------------------   07/01/2022: Stable bone metastases similar 8 mm left axillary lymph node. 12/31/2022: CT CAP: No significant interval change in pulm metastases.  No evidence of new metastatic disease.  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 Recent CT shows treated osseous metastatic disease with no new metastases. Experiencing intermittent hot flashes as a side effect. - Continue anastrozole  and Verzenio . - Administer Zoladex  on August 21. - Scans show stable osseous metastases, no evidence of progression.  Reviewed with patient in detail.    RTC as scheduled every 4 weeks for Zoladex , will see Dr. Gudena in October prior to Zoladex  injection.

## 2024-01-13 ENCOUNTER — Other Ambulatory Visit: Payer: Self-pay

## 2024-01-13 NOTE — Progress Notes (Signed)
 Specialty Pharmacy Refill Coordination Note  Berlinda Farve is a 33 y.o. female contacted today regarding refills of specialty medication(s) Abemaciclib  (VERZENIO )   Patient requested (Patient-Rptd) Delivery   Delivery date: 01/14/24   Verified address: (Patient-Rptd) [address] [place] [address]   Medication will be filled on 01/13/24.

## 2024-01-14 ENCOUNTER — Other Ambulatory Visit (HOSPITAL_COMMUNITY)

## 2024-01-20 ENCOUNTER — Other Ambulatory Visit (HOSPITAL_COMMUNITY)

## 2024-01-22 ENCOUNTER — Inpatient Hospital Stay

## 2024-01-22 VITALS — BP 118/75 | HR 82 | Temp 98.4°F | Resp 14

## 2024-01-22 DIAGNOSIS — Z1721 Progesterone receptor positive status: Secondary | ICD-10-CM | POA: Diagnosis not present

## 2024-01-22 DIAGNOSIS — Z9013 Acquired absence of bilateral breasts and nipples: Secondary | ICD-10-CM | POA: Diagnosis not present

## 2024-01-22 DIAGNOSIS — C50412 Malignant neoplasm of upper-outer quadrant of left female breast: Secondary | ICD-10-CM | POA: Diagnosis not present

## 2024-01-22 DIAGNOSIS — C7951 Secondary malignant neoplasm of bone: Secondary | ICD-10-CM | POA: Diagnosis not present

## 2024-01-22 DIAGNOSIS — Z170 Estrogen receptor positive status [ER+]: Secondary | ICD-10-CM

## 2024-01-22 DIAGNOSIS — Z1732 Human epidermal growth factor receptor 2 negative status: Secondary | ICD-10-CM | POA: Diagnosis not present

## 2024-01-22 DIAGNOSIS — Z79811 Long term (current) use of aromatase inhibitors: Secondary | ICD-10-CM | POA: Diagnosis not present

## 2024-01-22 DIAGNOSIS — Z5111 Encounter for antineoplastic chemotherapy: Secondary | ICD-10-CM | POA: Diagnosis present

## 2024-01-22 DIAGNOSIS — Z95828 Presence of other vascular implants and grafts: Secondary | ICD-10-CM

## 2024-01-22 MED ORDER — GOSERELIN ACETATE 3.6 MG ~~LOC~~ IMPL
3.6000 mg | DRUG_IMPLANT | Freq: Once | SUBCUTANEOUS | Status: AC
Start: 2024-01-22 — End: 2024-01-22
  Administered 2024-01-22: 3.6 mg via SUBCUTANEOUS
  Filled 2024-01-22: qty 3.6

## 2024-02-03 ENCOUNTER — Other Ambulatory Visit: Payer: Self-pay

## 2024-02-12 ENCOUNTER — Other Ambulatory Visit (HOSPITAL_COMMUNITY): Payer: Self-pay

## 2024-02-20 ENCOUNTER — Other Ambulatory Visit: Payer: Self-pay | Admitting: Pharmacy Technician

## 2024-02-20 ENCOUNTER — Other Ambulatory Visit: Payer: Self-pay

## 2024-02-20 ENCOUNTER — Encounter (INDEPENDENT_AMBULATORY_CARE_PROVIDER_SITE_OTHER): Payer: Self-pay

## 2024-02-20 NOTE — Progress Notes (Signed)
 Specialty Pharmacy Refill Coordination Note  Gabriela Sullivan is a 33 y.o. female contacted today regarding refills of specialty medication(s) Abemaciclib  (VERZENIO )   Patient requested Delivery   Delivery date: 02/25/24   Verified address: [address] [place] [address]   Medication will be filled on 02/24/24. Answered questionnaire

## 2024-02-23 ENCOUNTER — Inpatient Hospital Stay: Attending: Hematology and Oncology

## 2024-02-23 VITALS — BP 129/76 | HR 86 | Temp 98.7°F | Resp 19

## 2024-02-23 DIAGNOSIS — C7951 Secondary malignant neoplasm of bone: Secondary | ICD-10-CM | POA: Diagnosis not present

## 2024-02-23 DIAGNOSIS — Z1721 Progesterone receptor positive status: Secondary | ICD-10-CM | POA: Insufficient documentation

## 2024-02-23 DIAGNOSIS — Z95828 Presence of other vascular implants and grafts: Secondary | ICD-10-CM

## 2024-02-23 DIAGNOSIS — C50412 Malignant neoplasm of upper-outer quadrant of left female breast: Secondary | ICD-10-CM | POA: Diagnosis not present

## 2024-02-23 DIAGNOSIS — Z1732 Human epidermal growth factor receptor 2 negative status: Secondary | ICD-10-CM | POA: Insufficient documentation

## 2024-02-23 DIAGNOSIS — Z5111 Encounter for antineoplastic chemotherapy: Secondary | ICD-10-CM | POA: Diagnosis present

## 2024-02-23 DIAGNOSIS — Z79811 Long term (current) use of aromatase inhibitors: Secondary | ICD-10-CM | POA: Diagnosis not present

## 2024-02-23 DIAGNOSIS — Z170 Estrogen receptor positive status [ER+]: Secondary | ICD-10-CM | POA: Insufficient documentation

## 2024-02-23 MED ORDER — GOSERELIN ACETATE 3.6 MG ~~LOC~~ IMPL
3.6000 mg | DRUG_IMPLANT | Freq: Once | SUBCUTANEOUS | Status: AC
  Administered 2024-02-23: 3.6 mg via SUBCUTANEOUS
  Filled 2024-02-23: qty 3.6

## 2024-03-04 ENCOUNTER — Other Ambulatory Visit (HOSPITAL_COMMUNITY)

## 2024-03-16 ENCOUNTER — Other Ambulatory Visit (HOSPITAL_COMMUNITY): Payer: Self-pay

## 2024-03-16 ENCOUNTER — Encounter (INDEPENDENT_AMBULATORY_CARE_PROVIDER_SITE_OTHER): Payer: Self-pay

## 2024-03-16 NOTE — Progress Notes (Signed)
 Specialty Pharmacy Refill Coordination Note  Gabriela Sullivan is a 34 y.o. female contacted today regarding refills of specialty medication(s) Abemaciclib  (VERZENIO )   Patient requested Delivery   Delivery date: 03/19/24   Verified address: [address] [place] [address]   Medication will be filled on 03/18/24.

## 2024-03-17 ENCOUNTER — Other Ambulatory Visit: Payer: Self-pay

## 2024-03-23 ENCOUNTER — Inpatient Hospital Stay

## 2024-03-23 ENCOUNTER — Inpatient Hospital Stay: Attending: Hematology and Oncology

## 2024-03-23 ENCOUNTER — Other Ambulatory Visit: Payer: Self-pay | Admitting: Hematology and Oncology

## 2024-03-23 ENCOUNTER — Inpatient Hospital Stay (HOSPITAL_BASED_OUTPATIENT_CLINIC_OR_DEPARTMENT_OTHER): Admitting: Hematology and Oncology

## 2024-03-23 VITALS — BP 118/76 | HR 76 | Temp 97.7°F | Resp 18 | Ht 60.0 in | Wt 139.9 lb

## 2024-03-23 DIAGNOSIS — C50412 Malignant neoplasm of upper-outer quadrant of left female breast: Secondary | ICD-10-CM | POA: Insufficient documentation

## 2024-03-23 DIAGNOSIS — Z9221 Personal history of antineoplastic chemotherapy: Secondary | ICD-10-CM | POA: Diagnosis not present

## 2024-03-23 DIAGNOSIS — Z1732 Human epidermal growth factor receptor 2 negative status: Secondary | ICD-10-CM | POA: Diagnosis not present

## 2024-03-23 DIAGNOSIS — Z95828 Presence of other vascular implants and grafts: Secondary | ICD-10-CM

## 2024-03-23 DIAGNOSIS — Z79811 Long term (current) use of aromatase inhibitors: Secondary | ICD-10-CM | POA: Insufficient documentation

## 2024-03-23 DIAGNOSIS — C7951 Secondary malignant neoplasm of bone: Secondary | ICD-10-CM | POA: Diagnosis not present

## 2024-03-23 DIAGNOSIS — R112 Nausea with vomiting, unspecified: Secondary | ICD-10-CM | POA: Insufficient documentation

## 2024-03-23 DIAGNOSIS — Z9013 Acquired absence of bilateral breasts and nipples: Secondary | ICD-10-CM | POA: Insufficient documentation

## 2024-03-23 DIAGNOSIS — Z1721 Progesterone receptor positive status: Secondary | ICD-10-CM | POA: Diagnosis not present

## 2024-03-23 DIAGNOSIS — C7800 Secondary malignant neoplasm of unspecified lung: Secondary | ICD-10-CM | POA: Diagnosis not present

## 2024-03-23 DIAGNOSIS — Z5111 Encounter for antineoplastic chemotherapy: Secondary | ICD-10-CM | POA: Diagnosis present

## 2024-03-23 DIAGNOSIS — Z170 Estrogen receptor positive status [ER+]: Secondary | ICD-10-CM | POA: Insufficient documentation

## 2024-03-23 LAB — CMP (CANCER CENTER ONLY)
ALT: 15 U/L (ref 0–44)
AST: 24 U/L (ref 15–41)
Albumin: 4.3 g/dL (ref 3.5–5.0)
Alkaline Phosphatase: 34 U/L — ABNORMAL LOW (ref 38–126)
Anion gap: 6 (ref 5–15)
BUN: 16 mg/dL (ref 6–20)
CO2: 27 mmol/L (ref 22–32)
Calcium: 9.3 mg/dL (ref 8.9–10.3)
Chloride: 109 mmol/L (ref 98–111)
Creatinine: 0.61 mg/dL (ref 0.44–1.00)
GFR, Estimated: >60 mL/min (ref >60)
Glucose, Bld: 84 mg/dL (ref 70–99)
Potassium: 3.3 mmol/L — ABNORMAL LOW (ref 3.5–5.1)
Sodium: 142 mmol/L (ref 135–145)
Total Bilirubin: 1 mg/dL (ref 0.0–1.2)
Total Protein: 6.9 g/dL (ref 6.5–8.1)

## 2024-03-23 LAB — CBC WITH DIFFERENTIAL (CANCER CENTER ONLY)
Abs Immature Granulocytes: 0 K/uL (ref 0.00–0.07)
Basophils Absolute: 0 K/uL (ref 0.0–0.1)
Basophils Relative: 2 %
Eosinophils Absolute: 0 K/uL (ref 0.0–0.5)
Eosinophils Relative: 2 %
HCT: 32.4 % — ABNORMAL LOW (ref 36.0–46.0)
Hemoglobin: 12 g/dL (ref 12.0–15.0)
Immature Granulocytes: 0 %
Lymphocytes Relative: 34 %
Lymphs Abs: 0.7 K/uL (ref 0.7–4.0)
MCH: 34.1 pg — ABNORMAL HIGH (ref 26.0–34.0)
MCHC: 37 g/dL — ABNORMAL HIGH (ref 30.0–36.0)
MCV: 92 fL (ref 80.0–100.0)
Monocytes Absolute: 0.1 K/uL (ref 0.1–1.0)
Monocytes Relative: 6 %
Neutro Abs: 1.1 K/uL — ABNORMAL LOW (ref 1.7–7.7)
Neutrophils Relative %: 56 %
Platelet Count: 149 K/uL — ABNORMAL LOW (ref 150–400)
RBC: 3.52 MIL/uL — ABNORMAL LOW (ref 3.87–5.11)
RDW: 12.5 % (ref 11.5–15.5)
WBC Count: 2 K/uL — ABNORMAL LOW (ref 4.0–10.5)
nRBC: 0 % (ref 0.0–0.2)

## 2024-03-23 MED ORDER — ZOLEDRONIC ACID 4 MG/100ML IV SOLN
4.0000 mg | Freq: Once | INTRAVENOUS | Status: AC
  Administered 2024-03-23: 4 mg via INTRAVENOUS
  Filled 2024-03-23: qty 100

## 2024-03-23 MED ORDER — PANTOPRAZOLE SODIUM 40 MG PO TBEC: 40.0000 mg | DELAYED_RELEASE_TABLET | Freq: Every day | ORAL | 0 refills | Status: AC

## 2024-03-23 MED ORDER — GOSERELIN ACETATE 3.6 MG ~~LOC~~ IMPL
3.6000 mg | DRUG_IMPLANT | Freq: Once | SUBCUTANEOUS | Status: AC
  Administered 2024-03-23: 3.6 mg via SUBCUTANEOUS
  Filled 2024-03-23: qty 3.6

## 2024-03-23 MED ORDER — SODIUM CHLORIDE 0.9 % IV SOLN: Freq: Once | INTRAVENOUS | Status: AC

## 2024-03-23 NOTE — Progress Notes (Signed)
 Patient Care Team: Honore Sherryle RAMAN, MD as PCP - General (Family Medicine) Tyree Nanetta SAILOR, RN as Oncology Nurse Navigator Vanderbilt Ned, MD as Consulting Physician (General Surgery) Dewey Rush, MD as Consulting Physician (Radiation Oncology) Odean Potts, MD as Consulting Physician (Hematology and Oncology)  DIAGNOSIS:  Encounter Diagnosis  Name Primary?   Malignant neoplasm of upper-outer quadrant of left breast in female, estrogen receptor positive (HCC) Yes    SUMMARY OF ONCOLOGIC HISTORY: Oncology History  Ductal carcinoma in situ (DCIS) of right breast  09/29/2020 Initial Diagnosis   Ductal carcinoma in situ (DCIS) of right breast   10/04/2020 Cancer Staging   Staging form: Breast, AJCC 8th Edition - Clinical: Stage 0 (cTis (DCIS), cN0, cM0) - Signed by [name], MD on 10/04/2020   11/25/2020 Genetic Testing   Negative genetic testing:  No pathogenic variants detected on the [employer] CancerNext-Expanded + RNAinsight panel. The report date is 11/25/2020.   The CancerNext-Expanded + RNAinsight gene panel offered by [employer] and includes sequencing and rearrangement analysis for the following 77 genes: AIP, ALK, APC, ATM, AXIN2, BAP1, BARD1, BLM, BMPR1A, BRCA1, BRCA2, BRIP1, CDC73, CDH1, CDK4, CDKN1B, CDKN2A, CHEK2, CTNNA1, DICER1, FANCC, FH, FLCN, GALNT12, KIF1B, LZTR1, MAX, MEN1, MET, MLH1, MSH2, MSH3, MSH6, MUTYH, NBN, NF1, NF2, NTHL1, PALB2, PHOX2B, PMS2, POT1, PRKAR1A, PTCH1, PTEN, RAD51C, RAD51D, RB1, RECQL, RET, SDHA, SDHAF2, SDHB, SDHC, SDHD, SMAD4, SMARCA4, SMARCB1, SMARCE1, STK11, SUFU, TMEM127, TP53, TSC1, TSC2, VHL and XRCC2 (sequencing and deletion/duplication); EGFR, EGLN1, HOXB13, KIT, MITF, PDGFRA, POLD1 and POLE (sequencing only); EPCAM and GREM1 (deletion/duplication only). RNA data is routinely analyzed for use in variant interpretation for all genes.   Malignant neoplasm of upper-outer quadrant of left breast in female, estrogen receptor positive  (HCC)  09/29/2020 Initial Diagnosis   Malignant neoplasm of upper-outer quadrant of left breast in female, estrogen receptor positive (HCC)   10/01/2020 Relapse/Recurrence   Spine MRI: Scattered thoracic and lumbar vertebral body and posterior element metastases   10/25/2020 - 11/10/2020 Chemotherapy   2 cycles of Adriamycin  and Cytoxan .  Chemo discontinued because of rising LFTs.       11/25/2020 Genetic Testing   Negative genetic testing:  No pathogenic variants detected on the [employer] CancerNext-Expanded + RNAinsight panel. The report date is 11/25/2020.   The CancerNext-Expanded + RNAinsight gene panel offered by [employer] and includes sequencing and rearrangement analysis for the following 77 genes: AIP, ALK, APC, ATM, AXIN2, BAP1, BARD1, BLM, BMPR1A, BRCA1, BRCA2, BRIP1, CDC73, CDH1, CDK4, CDKN1B, CDKN2A, CHEK2, CTNNA1, DICER1, FANCC, FH, FLCN, GALNT12, KIF1B, LZTR1, MAX, MEN1, MET, MLH1, MSH2, MSH3, MSH6, MUTYH, NBN, NF1, NF2, NTHL1, PALB2, PHOX2B, PMS2, POT1, PRKAR1A, PTCH1, PTEN, RAD51C, RAD51D, RB1, RECQL, RET, SDHA, SDHAF2, SDHB, SDHC, SDHD, SMAD4, SMARCA4, SMARCB1, SMARCE1, STK11, SUFU, TMEM127, TP53, TSC1, TSC2, VHL and XRCC2 (sequencing and deletion/duplication); EGFR, EGLN1, HOXB13, KIT, MITF, PDGFRA, POLD1 and POLE (sequencing only); EPCAM and GREM1 (deletion/duplication only). RNA data is routinely analyzed for use in variant interpretation for all genes.    Anti-estrogen oral therapy   anastrozole  started 12/06/2020 goserelin started 10/11/2020  Verzinio started Aug 2022 zolendronate started 11/02/2018. Every 12 weeks.   01/31/2021 Surgery   Left mastectomy: Residual multifocal IDC 2 cm with intermediate grade DCIS, 15/15 lymph nodes positive, margins negative, lymphovascular space invasion present, ER 60%, PR 10%, HER2 negative, Ki-67 15% Right mastectomy: Fibrocystic change no residual cancer or DCIS     CHIEF COMPLIANT: Follow-up in Verzinio along with Zoladex  and  letrozole for metastatic  breast cancer  HISTORY OF PRESENT ILLNESS:  History of Present Illness Gabriela Sullivan is a 33 year old female who presents with recurrent vomiting over the past week.  She experiences vomiting of bile in the mornings and food in the afternoons or evenings, occurring at least twice daily but not consistently every day. There are no changes in diet, diarrhea, or fever. She has not experienced weight loss or significant anxiety related to eating, although she occasionally feels nervous about vomiting. No other stressors or personal issues are contributing to her symptoms.     ALLERGIES:  is allergic to hydrocodone -acetaminophen .  MEDICATIONS:  Current Outpatient Medications  Medication Sig Dispense Refill   abemaciclib  (VERZENIO ) 100 MG tablet Take 1 tablet (100 mg total) by mouth 2 (two) times daily. Swallow tablets whole. Do not chew, crush, or split tablets before swallowing. 56 tablet 3   anastrozole  (ARIMIDEX ) 1 MG tablet Take 1 tablet (1 mg total) by mouth daily. 90 tablet 3   calcium  carbonate (TUMS - DOSED IN MG ELEMENTAL CALCIUM ) 500 MG chewable tablet Chew 2 tablets by mouth every other day.     doxycycline  (VIBRAMYCIN ) 100 MG capsule Take 1 capsule (100 mg total) by mouth 2 (two) times daily. 20 capsule 0   MOUNJARO 7.5 MG/0.5ML Pen Inject 7.5 mg into the skin once a week.     potassium chloride  (KLOR-CON ) 10 MEQ tablet Take 1 tablet (10 mEq total) by mouth daily. 20 tablet 0   Vitamin D , Ergocalciferol , (DRISDOL ) 1.25 MG (50000 UNIT) CAPS capsule Take 50,000 Units by mouth once a week.     No current facility-administered medications for this visit.    PHYSICAL EXAMINATION: ECOG PERFORMANCE STATUS: 1 - Symptomatic but completely ambulatory  Vitals:   03/23/24 0811  BP: 118/76  Pulse: 76  Resp: 18  Temp: 97.7 F (36.5 C)  SpO2: 100%   Filed Weights   03/23/24 0811  Weight: 139 lb 14.4 oz (63.5 kg)      LABORATORY DATA:  I have reviewed the  data as listed    Latest Ref Rng & Units 12/22/2023    8:47 AM 08/29/2023    8:20 AM 07/21/2023   10:10 AM  CMP  Glucose 70 - 99 mg/dL 75  91    BUN 6 - 20 mg/dL 24  20    Creatinine 9.55 - 1.00 mg/dL 9.31  9.37    Sodium 864 - 145 mmol/L 140  140    Potassium 3.5 - 5.1 mmol/L 3.5  3.7    Chloride 98 - 111 mmol/L 109  107    CO2 22 - 32 mmol/L 26  27    Calcium  8.9 - 10.3 mg/dL 9.1  9.3    Total Protein 6.5 - 8.1 g/dL 7.3  7.6  7.6   Total Bilirubin 0.0 - 1.2 mg/dL 0.6  0.8  1.0   Alkaline Phos 38 - 126 U/L 37  46  36   AST 15 - 41 U/L 23  30  33   ALT 0 - 44 U/L 15  21  19      Lab Results  Component Value Date   WBC 2.0 (L) 03/23/2024   HGB 12.0 03/23/2024   HCT 32.4 (L) 03/23/2024   MCV 92.0 03/23/2024   PLT 149 (L) 03/23/2024   NEUTROABS 1.1 (L) 03/23/2024    ASSESSMENT & PLAN:  Malignant neoplasm of upper-outer quadrant of left breast in female, estrogen receptor positive [facility]) May 2021: Inflammatory breast cancer:  Invasive ductal cancer grade 2, 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% 09/25/2020: Right breast biopsy: DCIS grade 1, ER/PR positive 10/06/2020  breast MRI showed a 11 cm area of malignancy, with a second 1.9 cm mass, and skin thickening concerning for inflammatory breast carcinoma; at least 6 abnormal lymph nodes noted 10/13/2020 CT chest shows clear lungs and no obvious liver involvement but multiple lytic bone lesions in addition to the findings and MRI above 10/21/2020 spine MRI: Shows widely scattered thoracic and lumbar vertebral body metastases 10/24/2020: 2 cycles of dose dense Adriamycin  and Cytoxan  discontinued because of rising LFTs 12/01/2020: MRI abdomen: No evidence of metastatic disease in the liver 01/31/2021:Left mastectomy: Residual multifocal IDC 2 cm with intermediate grade DCIS, 15/15 lymph nodes positive, margins negative, lymphovascular space invasion present, ER 60%, PR 10%, HER2 negative, Ki-67 15% Right mastectomy: Fibrocystic change  no residual cancer or DCIS -------------------------------------------------------------------------------------------------------------------------------------------  Current treatment: Anastrozole  with goserelin and Verzenio  and Zometa  (since August 2022)   Toxicities: Tolerating the treatment very well Nausea and vomiting started 03/19/2024: Could be gastritis.  I sent a prescription for Protonix .   07/01/2022: Stable bone metastases similar 8 mm left axillary lymph node. 12/31/2022: CT CAP: No significant interval change in pulm metastases.  No evidence of new metastatic disease.  8 mm left axillary lymph node stable (stomach bug was in ED with nausea): Now resolved 01/05/2023: Abdominal pain: CT abdomen: Negative, CT head: Negative Hospitalization 01/24/2023-01/28/2023: diarrhea and fever 06/23/2023: CT CAP: Stable examination, similar bone metastases, unchanged 8 mm left axillary lymph node 01/12/2024: Stable bone metastasis  Continue Zoladex  injections monthly along with Zometa  infusions every 3 months  Follow-up every 3 months for labs and injections and infusions.  Monthly for injections.     No orders of the defined types were placed in this encounter.  The patient has a good understanding of the overall plan. she agrees with it. she will call with any problems that may develop before the next visit here.  I personally spent a total of 30 minutes in the care of the patient today including preparing to see the patient, getting/reviewing separately obtained history, performing a medically appropriate exam/evaluation, counseling and educating, placing orders, referring and communicating with other health care professionals, documenting clinical information in the EHR, independently interpreting results, communicating results, and coordinating care.   Viinay K Ameka Krigbaum, MD 03/23/24

## 2024-03-23 NOTE — Assessment & Plan Note (Signed)
 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% 09/25/2020: Right breast biopsy: DCIS grade 1, ER/PR positive 10/06/2020  breast MRI showed a 11 cm area of malignancy, with a second 1.9 cm mass, and skin thickening concerning for inflammatory breast carcinoma; at least 6 abnormal lymph nodes noted 10/13/2020 CT chest shows clear lungs and no obvious liver involvement but multiple lytic bone lesions in addition to the findings and MRI above 10/21/2020 spine MRI: Shows widely scattered thoracic and lumbar vertebral body metastases 10/24/2020: 2 cycles of dose dense Adriamycin  and Cytoxan  discontinued because of rising LFTs 12/01/2020: MRI abdomen: No evidence of metastatic disease in the liver 01/31/2021:Left mastectomy: Residual multifocal IDC 2 cm with intermediate grade DCIS, 15/15 lymph nodes positive, margins negative, lymphovascular space invasion present, ER 60%, PR 10%, HER2 negative, Ki-67 15% Right mastectomy: Fibrocystic change no residual cancer or DCIS -------------------------------------------------------------------------------------------------------------------------------------------  Current treatment: Anastrozole  with goserelin and Verzenio  and Zometa  (since August 2022)   Toxicities: Tolerating the treatment very well   07/01/2022: Stable bone metastases similar 8 mm left axillary lymph node. 12/31/2022: CT CAP: No significant interval change in pulm metastases.  No evidence of new metastatic disease.  8 mm left axillary lymph node stable (stomach bug was in ED with nausea): Now resolved 01/05/2023: Abdominal pain: CT abdomen: Negative, CT head: Negative Hospitalization 01/24/2023-01/28/2023: diarrhea and fever 06/23/2023: CT CAP: Stable examination, similar bone metastases, unchanged 8 mm left axillary lymph node 01/12/2024: Stable bone metastasis  Continue Zoladex  injections monthly along with Zometa  infusions every 3  months  Follow-up every 3 months for labs and injections and infusions.  Monthly for injections.

## 2024-03-23 NOTE — Patient Instructions (Signed)
 Zoledronic  Acid Injection (Cancer) What is this medication? ZOLEDRONIC  ACID (ZOE le dron ik AS id) treats high calcium levels in the blood caused by cancer. It may also be used with chemotherapy to treat weakened bones caused by cancer. It works by slowing down the release of calcium from bones. This lowers calcium levels in your blood. It also makes your bones stronger and less likely to break (fracture). It belongs to a group of medications called bisphosphonates. This medicine may be used for other purposes; ask your health care provider or pharmacist if you have questions. COMMON BRAND NAME(S): Zometa , Zometa  Powder What should I tell my care team before I take this medication? They need to know if you have any of these conditions: Dehydration Dental disease Kidney disease Liver disease Low levels of calcium in the blood Lung or breathing disease, such as asthma Receiving steroids, such as dexamethasone  or prednisone An unusual or allergic reaction to zoledronic  acid, other medications, foods, dyes, or preservatives Pregnant or trying to get pregnant Breast-feeding How should I use this medication? This medication is injected into a vein. It is given by your care team in a hospital or clinic setting. Talk to your care team about the use of this medication in children. Special care may be needed. Overdosage: If you think you have taken too much of this medicine contact a poison control center or emergency room at once. NOTE: This medicine is only for you. Do not share this medicine with others. What if I miss a dose? Keep appointments for follow-up doses. It is important not to miss your dose. Call your care team if you are unable to keep an appointment. What may interact with this medication? Certain antibiotics given by injection Diuretics, such as bumetanide, furosemide  NSAIDs, medications for pain and inflammation, such as ibuprofen  or naproxen Teriparatide Thalidomide This list  may not describe all possible interactions. Give your health care provider a list of all the medicines, herbs, non-prescription drugs, or dietary supplements you use. Also tell them if you smoke, drink alcohol, or use illegal drugs. Some items may interact with your medicine. What should I watch for while using this medication? Visit your care team for regular checks on your progress. It may be some time before you see the benefit from this medication. Some people who take this medication have severe bone, joint, or muscle pain. This medication may also increase your risk for jaw problems or a broken thigh bone. Tell your care team right away if you have severe pain in your jaw, bones, joints, or muscles. Tell you care team if you have any pain that does not go away or that gets worse. Tell your dentist and dental surgeon that you are taking this medication. You should not have major dental surgery while on this medication. See your dentist to have a dental exam and fix any dental problems before starting this medication. Take good care of your teeth while on this medication. Make sure you see your dentist for regular follow-up appointments. You should make sure you get enough calcium and vitamin D while you are taking this medication. Discuss the foods you eat and the vitamins you take with your care team. Check with your care team if you have severe diarrhea, nausea, and vomiting, or if you sweat a lot. The loss of too much body fluid may make it dangerous for you to take this medication. You may need bloodwork while taking this medication. Talk to your care team if  you wish to become pregnant or think you might be pregnant. This medication can cause serious birth defects. What side effects may I notice from receiving this medication? Side effects that you should report to your care team as soon as possible: Allergic reactions--skin rash, itching, hives, swelling of the face, lips, tongue, or  throat Kidney injury--decrease in the amount of urine, swelling of the ankles, hands, or feet Low calcium level--muscle pain or cramps, confusion, tingling, or numbness in the hands or feet Osteonecrosis of the jaw--pain, swelling, or redness in the mouth, numbness of the jaw, poor healing after dental work, unusual discharge from the mouth, visible bones in the mouth Severe bone, joint, or muscle pain Side effects that usually do not require medical attention (report to your care team if they continue or are bothersome): Constipation Fatigue Fever Loss of appetite Nausea Stomach pain This list may not describe all possible side effects. Call your doctor for medical advice about side effects. You may report side effects to FDA at 1-800-FDA-1088. Where should I keep my medication? This medication is given in a hospital or clinic. It will not be stored at home. NOTE: This sheet is a summary. It may not cover all possible information. If you have questions about this medicine, talk to your doctor, pharmacist, or health care provider.  Goserelin Implant What is this medication? GOSERELIN (GOE se rel in) treats prostate cancer and breast cancer. It works by decreasing levels of the hormones testosterone and estrogen in the body. This prevents prostate and breast cancer cells from spreading or growing. It may also be used to treat endometriosis. This is a condition where the tissue that lines the uterus grows outside the uterus. It works by decreasing the amount of estrogen your body makes, which reduces heavy bleeding and pain. It can also be used to help thin the lining of the uterus before a surgery used to prevent or reduce heavy periods. This medicine may be used for other purposes; ask your health care provider or pharmacist if you have questions. COMMON BRAND NAME(S): Zoladex , Zoladex  65-Month What should I tell my care team before I take this medication? They need to know if you have any of  these conditions: Bone problems Diabetes Heart disease History of irregular heartbeat or rhythm An unusual or allergic reaction to goserelin, other medications, foods, dyes, or preservatives Pregnant or trying to get pregnant Breastfeeding How should I use this medication? This medication is injected under the skin. It is given by your care team in a hospital or clinic setting. Talk to your care team about the use of this medication in children. Special care may be needed. Overdosage: If you think you have taken too much of this medicine contact a poison control center or emergency room at once. NOTE: This medicine is only for you. Do not share this medicine with others. What if I miss a dose? Keep appointments for follow-up doses. It is important not to miss your dose. Call your care team if you are unable to keep an appointment. What may interact with this medication? Do not take this medication with any of the following: Cisapride Dronedarone Pimozide Thioridazine This medication may also interact with the following: Other medications that cause heart rhythm changes This list may not describe all possible interactions. Give your health care provider a list of all the medicines, herbs, non-prescription drugs, or dietary supplements you use. Also tell them if you smoke, drink alcohol, or use illegal drugs. Some items  may interact with your medicine. What should I watch for while using this medication? Visit your care team for regular checks on your progress. Your symptoms may appear to get worse during the first weeks of this therapy. Tell your care team if your symptoms do not start to get better or if they get worse after this time. Using this medication for a long time may weaken your bones. If you smoke or frequently drink alcohol you may increase your risk of bone loss. A family history of osteoporosis, chronic use of medications for seizures (convulsions), or corticosteroids can also  increase your risk of bone loss. The risk of bone fractures may be increased. Talk to your care team about your bone health. This medication may increase blood sugar. The risk may be higher in patients who already have diabetes. Ask your care team what you can do to lower your risk of diabetes while taking this medication. This medication should stop regular monthly menstruation in women. Tell your care team if you continue to menstruate. Talk to your care team if you wish to become pregnant or think you might be pregnant. This medication can cause serious birth defects if taken during pregnancy or for 12 weeks after stopping treatment. Talk to your care team about reliable forms of contraception. Do not breastfeed while taking this medication. This medication may cause infertility. Talk to your care team if you are concerned about your fertility. What side effects may I notice from receiving this medication? Side effects that you should report to your care team as soon as possible: Allergic reactions--skin rash, itching, hives, swelling of the face, lips, tongue, or throat Change in the amount of urine Heart attack--pain or tightness in the chest, shoulders, arms, or jaw, nausea, shortness of breath, cold or clammy skin, feeling faint or lightheaded Heart rhythm changes--fast or irregular heartbeat, dizziness, feeling faint or lightheaded, chest pain, trouble breathing High blood sugar (hyperglycemia)--increased thirst or amount of urine, unusual weakness or fatigue, blurry vision High calcium level--increased thirst or amount of urine, nausea, vomiting, confusion, unusual weakness or fatigue, bone pain Pain, redness, irritation, or bruising at the injection site Severe back pain, numbness or weakness of the hands, arms, legs, or feet, loss of coordination, loss of bowel or bladder control Stroke--sudden numbness or weakness of the face, arm, or leg, trouble speaking, confusion, trouble walking,  loss of balance or coordination, dizziness, severe headache, change in vision Swelling and pain of the tumor site or lymph nodes Trouble passing urine Side effects that usually do not require medical attention (report to your care team if they continue or are bothersome): Change in sex drive or performance Headache Hot flashes Rapid or extreme change in emotion or mood Sweating Swelling of the ankles, hands, or feet Unusual vaginal discharge, itching, or odor This list may not describe all possible side effects. Call your doctor for medical advice about side effects. You may report side effects to FDA at 1-800-FDA-1088. Where should I keep my medication? This medication is given in a hospital or clinic. It will not be stored at home. NOTE: This sheet is a summary. It may not cover all possible information. If you have questions about this medicine, talk to your doctor, pharmacist, or health care provider.  2024 [employer]vier/Gold Standard (2021-10-11 00:00:00)  2024 [employer]/Gold Standard (2021-07-13 00:00:00)

## 2024-03-24 ENCOUNTER — Inpatient Hospital Stay: Admitting: Hematology and Oncology

## 2024-03-24 ENCOUNTER — Inpatient Hospital Stay

## 2024-03-24 LAB — CANCER ANTIGEN 27.29: CA 27.29: 11.6 U/mL (ref 0.0–38.6)

## 2024-04-06 ENCOUNTER — Other Ambulatory Visit (HOSPITAL_COMMUNITY): Payer: Self-pay

## 2024-04-06 ENCOUNTER — Other Ambulatory Visit: Payer: Self-pay | Admitting: Hematology and Oncology

## 2024-04-06 ENCOUNTER — Other Ambulatory Visit: Payer: Self-pay

## 2024-04-06 DIAGNOSIS — C50412 Malignant neoplasm of upper-outer quadrant of left female breast: Secondary | ICD-10-CM

## 2024-04-06 MED ORDER — ANASTROZOLE 1 MG PO TABS
1.0000 mg | ORAL_TABLET | Freq: Every day | ORAL | 3 refills | Status: AC
  Filled 2024-04-06: qty 90, 90d supply, fill #0
  Filled 2024-07-08: qty 90, 90d supply, fill #1

## 2024-04-15 ENCOUNTER — Other Ambulatory Visit: Payer: Self-pay | Admitting: Hematology and Oncology

## 2024-04-15 ENCOUNTER — Other Ambulatory Visit: Payer: Self-pay

## 2024-04-16 ENCOUNTER — Other Ambulatory Visit: Payer: Self-pay

## 2024-04-16 MED ORDER — ABEMACICLIB 100 MG PO TABS
100.0000 mg | ORAL_TABLET | Freq: Two times a day (BID) | ORAL | 3 refills | Status: AC
  Filled 2024-04-16: qty 56, 28d supply, fill #0
  Filled 2024-05-06: qty 56, 28d supply, fill #1
  Filled 2024-06-07: qty 56, 28d supply, fill #2

## 2024-04-17 ENCOUNTER — Encounter (INDEPENDENT_AMBULATORY_CARE_PROVIDER_SITE_OTHER): Payer: Self-pay

## 2024-04-19 ENCOUNTER — Other Ambulatory Visit: Payer: Self-pay

## 2024-04-19 NOTE — Progress Notes (Signed)
 Specialty Pharmacy Refill Coordination Note  Gabriela Sullivan is a 33 y.o. female contacted today regarding refills of specialty medication(s) Abemaciclib  (VERZENIO )   Patient requested No data recorded  Delivery date: 04/21/24   Verified address: [address] [place]   Medication will be filled on: 04/20/24

## 2024-04-28 ENCOUNTER — Inpatient Hospital Stay: Attending: Hematology and Oncology

## 2024-04-28 VITALS — BP 103/57 | HR 67 | Temp 98.6°F | Resp 16

## 2024-04-28 DIAGNOSIS — Z79811 Long term (current) use of aromatase inhibitors: Secondary | ICD-10-CM | POA: Diagnosis not present

## 2024-04-28 DIAGNOSIS — Z1732 Human epidermal growth factor receptor 2 negative status: Secondary | ICD-10-CM | POA: Diagnosis not present

## 2024-04-28 DIAGNOSIS — C7951 Secondary malignant neoplasm of bone: Secondary | ICD-10-CM | POA: Diagnosis not present

## 2024-04-28 DIAGNOSIS — C50412 Malignant neoplasm of upper-outer quadrant of left female breast: Secondary | ICD-10-CM | POA: Insufficient documentation

## 2024-04-28 DIAGNOSIS — Z95828 Presence of other vascular implants and grafts: Secondary | ICD-10-CM

## 2024-04-28 DIAGNOSIS — Z5111 Encounter for antineoplastic chemotherapy: Secondary | ICD-10-CM | POA: Diagnosis present

## 2024-04-28 DIAGNOSIS — Z170 Estrogen receptor positive status [ER+]: Secondary | ICD-10-CM | POA: Diagnosis not present

## 2024-04-28 DIAGNOSIS — Z1721 Progesterone receptor positive status: Secondary | ICD-10-CM | POA: Diagnosis not present

## 2024-04-28 MED ORDER — GOSERELIN ACETATE 3.6 MG ~~LOC~~ IMPL
3.6000 mg | DRUG_IMPLANT | Freq: Once | SUBCUTANEOUS | Status: AC
  Administered 2024-04-28: 3.6 mg via SUBCUTANEOUS
  Filled 2024-04-28: qty 3.6

## 2024-05-06 ENCOUNTER — Other Ambulatory Visit: Payer: Self-pay

## 2024-05-06 NOTE — Progress Notes (Signed)
 Specialty Pharmacy Refill Coordination Note  Gabriela Sullivan is a 33 y.o. female contacted today regarding refills of specialty medication(s) Abemaciclib  (VERZENIO )   Patient requested Delivery   Delivery date: 05/18/24   Verified address: [address] [place]   Medication will be filled on: 05/17/24

## 2024-05-06 NOTE — Progress Notes (Signed)
 Specialty Pharmacy Ongoing Clinical Assessment Note  Gabriela Sullivan is a 33 y.o. female who is being followed by the specialty pharmacy service for RxSp Oncology   Patient's specialty medication(s) reviewed today: Abemaciclib  (VERZENIO )   Missed doses in the last 4 weeks: 0   Patient/Caregiver did not have any additional questions or concerns.   Therapeutic benefit summary: Patient is achieving benefit   Adverse events/side effects summary: No adverse events/side effects   Patient's therapy is appropriate to: Continue    Goals Addressed             This Visit's Progress    Slow Disease Progression   On track    Patient is on track. Patient will maintain adherence. CT in August showed stable disease, no new spots or changes in existing ones.         Follow up: 6 months  [facility]

## 2024-05-15 ENCOUNTER — Encounter (INDEPENDENT_AMBULATORY_CARE_PROVIDER_SITE_OTHER): Payer: Self-pay

## 2024-05-17 ENCOUNTER — Other Ambulatory Visit: Payer: Self-pay

## 2024-05-24 ENCOUNTER — Other Ambulatory Visit (HOSPITAL_COMMUNITY)
Admission: RE | Admit: 2024-05-24 | Discharge: 2024-05-24 | Disposition: A | Discharge status: Home / Self Care | Payer: Self-pay | Source: Ambulatory Visit | Attending: Medical Genetics | Admitting: Medical Genetics

## 2024-05-24 ENCOUNTER — Inpatient Hospital Stay: Admitting: Hematology and Oncology

## 2024-05-24 ENCOUNTER — Inpatient Hospital Stay

## 2024-05-26 ENCOUNTER — Emergency Department (HOSPITAL_COMMUNITY)
Admission: EM | Admit: 2024-05-26 | Discharge: 2024-05-26 | Disposition: A | Discharge status: Home / Self Care | Attending: Emergency Medicine | Admitting: Emergency Medicine

## 2024-05-26 DIAGNOSIS — Z853 Personal history of malignant neoplasm of breast: Secondary | ICD-10-CM | POA: Insufficient documentation

## 2024-05-26 DIAGNOSIS — Z794 Long term (current) use of insulin: Secondary | ICD-10-CM | POA: Diagnosis not present

## 2024-05-26 DIAGNOSIS — L608 Other nail disorders: Secondary | ICD-10-CM | POA: Diagnosis present

## 2024-05-26 DIAGNOSIS — J45909 Unspecified asthma, uncomplicated: Secondary | ICD-10-CM | POA: Diagnosis not present

## 2024-05-26 DIAGNOSIS — L03012 Cellulitis of left finger: Secondary | ICD-10-CM | POA: Diagnosis not present

## 2024-05-26 DIAGNOSIS — E119 Type 2 diabetes mellitus without complications: Secondary | ICD-10-CM | POA: Diagnosis not present

## 2024-05-26 MED ORDER — DOXYCYCLINE HYCLATE 100 MG PO CAPS: 100.0000 mg | ORAL_CAPSULE | Freq: Two times a day (BID) | ORAL | 0 refills | Status: AC

## 2024-05-26 NOTE — ED Provider Notes (Signed)
 " [facility] Provider Note  CSN: [id number] Arrival date & time: 05/26/24 1647  Chief Complaint(s) Nail Problem  HPI Gabriela [facility] is a 33 y.o. female here today for nail pain.  Patient reports that for the last couple of days, patient has had some pain at the tip of her finger.  Says that she might of hit it on something a couple of months ago.  Currently undergoing treatment for metastatic breast cancer.   Past Medical History Past Medical History:  Diagnosis Date   Asthma    remote   Breast cancer (HCC)    Elevated liver enzymes    Family history of breast cancer    Family history of ovarian cancer    Family history of prostate cancer    Family history of stomach cancer    Hepatic steatosis    Hyperglycemia    A1c 7.6% 12/19/20   Migraines    PCOS (polycystic ovarian syndrome)    Pre-diabetes    Patient Active Problem List   Diagnosis Date Noted   Sepsis (HCC) 01/10/2023   Pancytopenia (HCC) 01/10/2023   Febrile neutropenia 01/10/2023   Hypokalemia 01/10/2023   Hyponatremia 01/10/2023   Hypocalcemia 01/10/2023   Hyperbilirubinemia 01/10/2023   Elevated lipase 01/10/2023   Nausea, vomiting and diarrhea 01/10/2023   Transaminitis 01/10/2023   Nonalcoholic steatohepatitis 06/21/2022   Type 2 diabetes mellitus (HCC) 06/20/2022   History of therapeutic radiation 11/07/2021   Left breast cancer with T3 tumor, >5 cm in greatest dimension (HCC) 01/31/2021   Malignant neoplasm metastatic to bone (HCC) 12/28/2020   Port-A-Cath in place 12/06/2020   Genetic testing 11/25/2020   Goals of care, counseling/discussion 10/25/2020   Lytic bone lesions on xray 10/16/2020   Family history of ovarian cancer    Family history of prostate cancer    Family history of breast cancer    Family history of stomach cancer    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 Authorizing Provider  doxycycline  (VIBRAMYCIN ) 100 MG capsule Take 1 capsule (100 mg total) by mouth 2 (two) times daily. 05/26/24  Yes Mannie Pac T, DO  abemaciclib  (VERZENIO ) 100 MG tablet Take 1 tablet (100 mg total) by mouth 2 (two) times daily. Swallow tablets whole. Do not chew, crush, or split tablets before swallowing. 04/16/24   Odean Potts, MD  anastrozole  (ARIMIDEX ) 1 MG tablet Take 1 tablet (1 mg total) by mouth daily. 04/06/24   Gudena, Vinay, MD  calcium  carbonate (TUMS - DOSED IN MG ELEMENTAL CALCIUM ) 500 MG chewable tablet Chew 2 tablets by mouth every other day.    [provider]  MOUNJARO 7.5 MG/0.5ML Pen Inject 7.5 mg into the skin once a week. 07/25/23   [provider]  pantoprazole  (PROTONIX ) 40 MG tablet Take 1 tablet (40 mg total) by mouth daily. 03/23/24   Odean Potts, MD  potassium chloride  (KLOR-CON ) 10 MEQ tablet Take 1 tablet (10 mEq total) by mouth daily. 01/05/23   Prosperi, Christian H, PA-C  Vitamin D , Ergocalciferol , (DRISDOL ) 1.25 MG (50000 UNIT) CAPS capsule Take 50,000 Units by mouth once a week. 06/20/22   [provider]  prochlorperazine  (COMPAZINE ) 10 MG tablet Take 1 tablet (10 mg total) by mouth every  6 (six) hours as needed (Nausea or vomiting). 10/04/20 12/21/20  Magrinat, Sandria BROCKS, MD                                                                                                                                    Past Surgical History Past Surgical History:  Procedure Laterality Date   MASTECTOMY W/ SENTINEL NODE BIOPSY Right 01/31/2021   Procedure: RIGHT SIMPLE MASTECTOMY;  Surgeon: Vanderbilt Ned, MD;  Location: [facility] OR;  Service: General;  Laterality: Right;   MODIFIED MASTECTOMY Left 01/31/2021   Procedure: LEFT MODIFIED RADICAL MASTECTOMY;  Surgeon:  Vanderbilt Ned, MD;  Location: [facility] OR;  Service: General;  Laterality: Left;   NO PAST SURGERIES     PORTACATH PLACEMENT Right 10/18/2020   Procedure: INSERTION PORT-A-CATH;  Surgeon: Vanderbilt Ned, MD;  Location:  SURGERY CENTER;  Service: General;  Laterality: Right;   Family History Family History  Problem Relation Age of Onset   Ovarian cancer Maternal Grandmother        unknown age of diagnosis   Prostate cancer Maternal Grandfather        metastatic   Breast cancer Cousin        unknown age of diagnosis, maternal first cousin   Stomach cancer Cousin        dx 20s/30s, maternal first cousin    Social History Social History[1] Allergies Hydrocodone -acetaminophen   Review of Systems Review of Systems  Physical Exam Vital Signs  I have reviewed the triage vital signs BP 115/82 (BP Location: Right Arm)   Pulse 73   Temp 98 F (36.7 C) (Oral)   Resp 18   SpO2 98%   Physical Exam Vitals and nursing note reviewed.  Constitutional:      Appearance: She is not toxic-appearing.  Eyes:     Pupils: Pupils are equal, round, and reactive to light.  Cardiovascular:     Rate and Rhythm: Normal rate.  Pulmonary:     Effort: Pulmonary effort is normal.  Abdominal:     General: Abdomen is flat.  Musculoskeletal:        General: Normal range of motion.     Comments: Mild redness and swelling of the tip of the fifth finger on the left hand.  No pus, no purulence.  No deformity.  Intact FDS and FDP function  Neurological:     General: No focal deficit present.     Mental Status: She is alert.     ED Results and Treatments Labs (all labs ordered are listed, but only abnormal results are displayed) Labs Reviewed - No data to display  Radiology No results found.  Pertinent labs & imaging results that were available during my care of the patient  were reviewed by me and considered in my medical decision making (see MDM for details).  Medications Ordered in ED Medications - No data to display                                                                                                                                   Procedures Procedures  (including critical care time)  Medical Decision Making / ED Course   This patient presents to the ED for concern of finger pain and nail., this involves an extensive number of treatment options, and is a complaint that carries with it a high risk of complications and morbidity.  The differential diagnosis includes paronychia, possibly felon, less likely trauma.  MDM: Overall finger looks okay.  Do not believe she requires any imaging.  Out of an abundance of caution, we will start the patient on doxycycline .  Return precaution discussed at bedside.  Will discharge.   Additional history obtained: -Additional history obtained from  Family at bedside -External records from outside source obtained and reviewed including: Chart review including previous notes, labs, imaging, consultation notes   Lab Tests: -I ordered, reviewed, and interpreted labs.   The pertinent results include:   Labs Reviewed - No data to display     Medicines ordered and prescription drug management: Meds ordered this encounter  Medications   doxycycline  (VIBRAMYCIN ) 100 MG capsule    Sig: Take 1 capsule (100 mg total) by mouth 2 (two) times daily.    Dispense:  20 capsule    Refill:  0    -I have reviewed the patients home medicines and have made adjustments as needed   Cardiac Monitoring: The patient was maintained on a cardiac monitor.  I personally viewed and interpreted the cardiac monitored which showed an underlying rhythm of: Normal sinus rhythm  Social Determinants of Health:  Factors impacting patients care include: Lack of access to primary care, multiple medical comorbidities including  metastatic cancer   Reevaluation: After the interventions noted above, I reevaluated the patient and found that they have :improved  Co morbidities that complicate the patient evaluation  Past Medical History:  Diagnosis Date   Asthma    remote   Breast cancer (HCC)    Elevated liver enzymes    Family history of breast cancer    Family history of ovarian cancer    Family history of prostate cancer    Family history of stomach cancer    Hepatic steatosis    Hyperglycemia    A1c 7.6% 12/19/20   Migraines    PCOS (polycystic ovarian syndrome)    Pre-diabetes       Dispostion: I considered admission for this patient, however she is appropriate for outpatient management.     Final Clinical Impression(s) / ED Diagnoses Final diagnoses:  Paronychia  of finger of left hand     @PCDICTATION @     [1]  Social History Tobacco Use   Smoking status: Never   Smokeless tobacco: Never  Vaping Use   Vaping status: Never Used  Substance Use Topics   Alcohol use: Never   Drug use: Never     Mannie Fairy DASEN, DO 05/26/24 1937  "

## 2024-05-26 NOTE — ED Triage Notes (Signed)
 Pt reports hit nail against something x 1 month ago, pt reports for a few weeks it looked  ugly , pt reports now the pinky nail is lifting with pain. Pt is undergoing tx via infusion for Lung ca.

## 2024-05-26 NOTE — Discharge Instructions (Addendum)
 You can take doxycycline  once in the morning once in the evening for the next 10 days.  Please follow-up with your primary care doctor.

## 2024-05-28 ENCOUNTER — Inpatient Hospital Stay: Attending: Hematology and Oncology

## 2024-06-01 ENCOUNTER — Inpatient Hospital Stay: Attending: Hematology and Oncology

## 2024-06-01 VITALS — BP 106/87 | HR 67 | Temp 99.2°F | Resp 18

## 2024-06-01 DIAGNOSIS — Z170 Estrogen receptor positive status [ER+]: Secondary | ICD-10-CM | POA: Diagnosis not present

## 2024-06-01 DIAGNOSIS — Z1732 Human epidermal growth factor receptor 2 negative status: Secondary | ICD-10-CM | POA: Insufficient documentation

## 2024-06-01 DIAGNOSIS — C50412 Malignant neoplasm of upper-outer quadrant of left female breast: Secondary | ICD-10-CM | POA: Insufficient documentation

## 2024-06-01 DIAGNOSIS — C7951 Secondary malignant neoplasm of bone: Secondary | ICD-10-CM | POA: Diagnosis not present

## 2024-06-01 DIAGNOSIS — Z95828 Presence of other vascular implants and grafts: Secondary | ICD-10-CM

## 2024-06-01 DIAGNOSIS — Z1721 Progesterone receptor positive status: Secondary | ICD-10-CM | POA: Insufficient documentation

## 2024-06-01 DIAGNOSIS — Z5111 Encounter for antineoplastic chemotherapy: Secondary | ICD-10-CM | POA: Insufficient documentation

## 2024-06-01 MED ORDER — GOSERELIN ACETATE 3.6 MG ~~LOC~~ IMPL
3.6000 mg | DRUG_IMPLANT | Freq: Once | SUBCUTANEOUS | Status: AC
  Administered 2024-06-01: 3.6 mg via SUBCUTANEOUS
  Filled 2024-06-01: qty 3.6

## 2024-06-07 ENCOUNTER — Other Ambulatory Visit: Payer: Self-pay

## 2024-06-07 ENCOUNTER — Other Ambulatory Visit (HOSPITAL_COMMUNITY): Payer: Self-pay

## 2024-06-07 NOTE — Progress Notes (Signed)
 Specialty Pharmacy Refill Coordination Note  Gabriela Sullivan is a 34 y.o. female contacted today regarding refills of specialty medication(s) Abemaciclib  (VERZENIO )   Patient requested Delivery   Delivery date: 06/22/24   Verified address: [address] [place] [address]   Medication will be filled on: 06/21/24

## 2024-06-08 LAB — GENECONNECT MOLECULAR SCREEN: Genetic Analysis Overall Interpretation: NEGATIVE

## 2024-06-25 ENCOUNTER — Encounter: Payer: Self-pay | Admitting: Hematology and Oncology

## 2024-06-25 ENCOUNTER — Telehealth: Payer: Self-pay | Admitting: Adult Health

## 2024-06-25 NOTE — Telephone Encounter (Signed)
 Outgoing call completed 09/18/2022.

## 2024-06-25 NOTE — Telephone Encounter (Signed)
 I left voicemail for patient regarding rescheduled appointments from 06/28/2024 to 07/02/2024.

## 2024-06-28 ENCOUNTER — Inpatient Hospital Stay: Admitting: Hematology and Oncology

## 2024-06-28 ENCOUNTER — Inpatient Hospital Stay

## 2024-07-02 ENCOUNTER — Inpatient Hospital Stay: Admitting: Adult Health

## 2024-07-02 ENCOUNTER — Inpatient Hospital Stay

## 2024-07-02 ENCOUNTER — Inpatient Hospital Stay: Attending: Hematology and Oncology

## 2024-07-02 ENCOUNTER — Encounter: Payer: Self-pay | Admitting: Adult Health

## 2024-07-02 VITALS — BP 115/73 | HR 74 | Temp 98.0°F | Resp 18 | Ht 60.0 in | Wt 131.9 lb

## 2024-07-02 DIAGNOSIS — Z1732 Human epidermal growth factor receptor 2 negative status: Secondary | ICD-10-CM | POA: Insufficient documentation

## 2024-07-02 DIAGNOSIS — C7951 Secondary malignant neoplasm of bone: Secondary | ICD-10-CM

## 2024-07-02 DIAGNOSIS — Z1721 Progesterone receptor positive status: Secondary | ICD-10-CM | POA: Insufficient documentation

## 2024-07-02 DIAGNOSIS — Z5111 Encounter for antineoplastic chemotherapy: Secondary | ICD-10-CM | POA: Insufficient documentation

## 2024-07-02 DIAGNOSIS — C50412 Malignant neoplasm of upper-outer quadrant of left female breast: Secondary | ICD-10-CM

## 2024-07-02 DIAGNOSIS — Z95828 Presence of other vascular implants and grafts: Secondary | ICD-10-CM

## 2024-07-02 DIAGNOSIS — Z170 Estrogen receptor positive status [ER+]: Secondary | ICD-10-CM | POA: Diagnosis not present

## 2024-07-02 LAB — CMP (CANCER CENTER ONLY)
ALT: 21 U/L (ref 0–44)
AST: 30 U/L (ref 15–41)
Albumin: 4.6 g/dL (ref 3.5–5.0)
Alkaline Phosphatase: 51 U/L (ref 38–126)
Anion gap: 12 (ref 5–15)
BUN: 18 mg/dL (ref 6–20)
CO2: 23 mmol/L (ref 22–32)
Calcium: 9.5 mg/dL (ref 8.9–10.3)
Chloride: 106 mmol/L (ref 98–111)
Creatinine: 0.67 mg/dL (ref 0.44–1.00)
GFR, Estimated: >60 mL/min
Glucose, Bld: 82 mg/dL (ref 70–99)
Potassium: 4.4 mmol/L (ref 3.5–5.1)
Sodium: 141 mmol/L (ref 135–145)
Total Bilirubin: 0.8 mg/dL (ref 0.0–1.2)
Total Protein: 7.5 g/dL (ref 6.5–8.1)

## 2024-07-02 LAB — CBC WITH DIFFERENTIAL (CANCER CENTER ONLY)
Abs Immature Granulocytes: 0.01 10*3/uL (ref 0.00–0.07)
Basophils Absolute: 0 10*3/uL (ref 0.0–0.1)
Basophils Relative: 1 %
Eosinophils Absolute: 0.1 10*3/uL (ref 0.0–0.5)
Eosinophils Relative: 2 %
HCT: 35.9 % — ABNORMAL LOW (ref 36.0–46.0)
Hemoglobin: 13.1 g/dL (ref 12.0–15.0)
Immature Granulocytes: 0 %
Lymphocytes Relative: 41 %
Lymphs Abs: 1.2 10*3/uL (ref 0.7–4.0)
MCH: 34.3 pg — ABNORMAL HIGH (ref 26.0–34.0)
MCHC: 36.5 g/dL — ABNORMAL HIGH (ref 30.0–36.0)
MCV: 94 fL (ref 80.0–100.0)
Monocytes Absolute: 0.2 10*3/uL (ref 0.1–1.0)
Monocytes Relative: 6 %
Neutro Abs: 1.4 10*3/uL — ABNORMAL LOW (ref 1.7–7.7)
Neutrophils Relative %: 50 %
Platelet Count: 175 10*3/uL (ref 150–400)
RBC: 3.82 MIL/uL — ABNORMAL LOW (ref 3.87–5.11)
RDW: 12.1 % (ref 11.5–15.5)
WBC Count: 2.8 10*3/uL — ABNORMAL LOW (ref 4.0–10.5)
nRBC: 0 % (ref 0.0–0.2)

## 2024-07-02 MED ORDER — SODIUM CHLORIDE 0.9 % IV SOLN: Freq: Once | INTRAVENOUS | Status: AC

## 2024-07-02 MED ORDER — GOSERELIN ACETATE 3.6 MG ~~LOC~~ IMPL
3.6000 mg | DRUG_IMPLANT | Freq: Once | SUBCUTANEOUS | Status: AC
  Administered 2024-07-02: 3.6 mg via SUBCUTANEOUS
  Filled 2024-07-02: qty 3.6

## 2024-07-02 MED ORDER — ZOLEDRONIC ACID 4 MG/100ML IV SOLN
4.0000 mg | Freq: Once | INTRAVENOUS | Status: AC
  Administered 2024-07-02: 4 mg via INTRAVENOUS
  Filled 2024-07-02: qty 100

## 2024-07-02 NOTE — Progress Notes (Unsigned)
 Gabriela [facility] Follow up:    McCuddy, Gabriela S, MD [address] [place] [address]   DIAGNOSIS: Cancer Staging  Ductal carcinoma in situ (DCIS) of right breast Staging form: Breast, AJCC 8th Edition - Clinical: Stage 0 (cTis (DCIS), cN0, cM0) - Signed by [name], MD on 10/04/2020  Malignant neoplasm of upper-outer quadrant of left breast in female, estrogen receptor positive (HCC) Staging form: Breast, AJCC 8th Edition - Clinical stage from 10/04/2020: Stage IIA (cT3, cN1(f), cM0, G2, ER+, PR+, HER2-) - Signed by [name], MD on 10/04/2020 Stage prefix: Initial diagnosis Method of lymph node assessment: Core biopsy Histologic grading system: 3 grade system    SUMMARY OF ONCOLOGIC HISTORY: Oncology History  Ductal carcinoma in situ (DCIS) of right breast  09/29/2020 Initial Diagnosis   Ductal carcinoma in situ (DCIS) of right breast   10/04/2020 Cancer Staging   Staging form: Breast, AJCC 8th Edition - Clinical: Stage 0 (cTis (DCIS), cN0, cM0) - Signed by [name], MD on 10/04/2020   11/25/2020 Genetic Testing   Negative genetic testing:  No pathogenic variants detected on the [employer] CancerNext-Expanded + RNAinsight panel. The report date is 11/25/2020.   The CancerNext-Expanded + RNAinsight gene panel offered by [employer] and includes sequencing and rearrangement analysis for the following 77 genes: AIP, ALK, APC, ATM, AXIN2, BAP1, BARD1, BLM, BMPR1A, BRCA1, BRCA2, BRIP1, CDC73, CDH1, CDK4, CDKN1B, CDKN2A, CHEK2, CTNNA1, DICER1, FANCC, FH, FLCN, GALNT12, KIF1B, LZTR1, MAX, MEN1, MET, MLH1, MSH2, MSH3, MSH6, MUTYH, NBN, NF1, NF2, NTHL1, PALB2, PHOX2B, PMS2, POT1, PRKAR1A, PTCH1, PTEN, RAD51C, RAD51D, RB1, RECQL, RET, SDHA, SDHAF2, SDHB, SDHC, SDHD, SMAD4, SMARCA4, SMARCB1, SMARCE1, STK11, SUFU, TMEM127, TP53, TSC1, TSC2, VHL and XRCC2 (sequencing and deletion/duplication); EGFR, EGLN1, HOXB13, KIT, MITF, PDGFRA, POLD1 and POLE  (sequencing only); EPCAM and GREM1 (deletion/duplication only). RNA data is routinely analyzed for use in variant interpretation for all genes.   Malignant neoplasm of upper-outer quadrant of left breast in female, estrogen receptor positive (HCC)  09/29/2020 Initial Diagnosis   Malignant neoplasm of upper-outer quadrant of left breast in female, estrogen receptor positive (HCC)   10/01/2020 Relapse/Recurrence   Spine MRI: Scattered thoracic and lumbar vertebral body and posterior element metastases   10/25/2020 - 11/10/2020 Chemotherapy   2 cycles of Adriamycin  and Cytoxan .  Chemo discontinued because of rising LFTs.       11/25/2020 Genetic Testing   Negative genetic testing:  No pathogenic variants detected on the [employer] CancerNext-Expanded + RNAinsight panel. The report date is 11/25/2020.   The CancerNext-Expanded + RNAinsight gene panel offered by [employer] and includes sequencing and rearrangement analysis for the following 77 genes: AIP, ALK, APC, ATM, AXIN2, BAP1, BARD1, BLM, BMPR1A, BRCA1, BRCA2, BRIP1, CDC73, CDH1, CDK4, CDKN1B, CDKN2A, CHEK2, CTNNA1, DICER1, FANCC, FH, FLCN, GALNT12, KIF1B, LZTR1, MAX, MEN1, MET, MLH1, MSH2, MSH3, MSH6, MUTYH, NBN, NF1, NF2, NTHL1, PALB2, PHOX2B, PMS2, POT1, PRKAR1A, PTCH1, PTEN, RAD51C, RAD51D, RB1, RECQL, RET, SDHA, SDHAF2, SDHB, SDHC, SDHD, SMAD4, SMARCA4, SMARCB1, SMARCE1, STK11, SUFU, TMEM127, TP53, TSC1, TSC2, VHL and XRCC2 (sequencing and deletion/duplication); EGFR, EGLN1, HOXB13, KIT, MITF, PDGFRA, POLD1 and POLE (sequencing only); EPCAM and GREM1 (deletion/duplication only). RNA data is routinely analyzed for use in variant interpretation for all genes.    Anti-estrogen oral therapy   anastrozole  started 12/06/2020 goserelin started 10/11/2020  Verzinio started Aug 2022 zolendronate started 11/02/2018. Every 12 weeks.   01/31/2021 Surgery   Left mastectomy: Residual multifocal IDC 2  cm with intermediate grade DCIS, 15/15 lymph nodes  positive, margins negative, lymphovascular space invasion present, ER 60%, PR 10%, HER2 negative, Ki-67 15% Right mastectomy: Fibrocystic change no residual cancer or DCIS     CURRENT THERAPY:  INTERVAL HISTORY:  Discussed the use of AI scribe software for clinical note transcription with the patient, who gave verbal consent to proceed.  History of Present Illness      Patient Active Problem List   Diagnosis Date Noted   Sepsis (HCC) 01/10/2023   Pancytopenia (HCC) 01/10/2023   Febrile neutropenia 01/10/2023   Hypokalemia 01/10/2023   Hyponatremia 01/10/2023   Hypocalcemia 01/10/2023   Hyperbilirubinemia 01/10/2023   Elevated lipase 01/10/2023   Nausea, vomiting and diarrhea 01/10/2023   Transaminitis 01/10/2023   Nonalcoholic steatohepatitis 06/21/2022   Type 2 diabetes mellitus (HCC) 06/20/2022   History of therapeutic radiation 11/07/2021   Left breast cancer with T3 tumor, >5 cm in greatest dimension (HCC) 01/31/2021   Malignant neoplasm metastatic to bone (HCC) 12/28/2020   Port-A-Cath in place 12/06/2020   Genetic testing 11/25/2020   Goals of care, counseling/discussion 10/25/2020   Lytic bone lesions on xray 10/16/2020   Family history of ovarian cancer    Family history of prostate cancer    Family history of breast cancer    Family history of stomach cancer    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  MEDICAL HISTORY: Past Medical History:  Diagnosis Date   Asthma    remote   Breast cancer (HCC)    Elevated liver enzymes    Family history of breast cancer    Family history of ovarian cancer    Family history of prostate cancer    Family history of stomach cancer    Hepatic steatosis    Hyperglycemia    A1c 7.6% 12/19/20   Migraines    PCOS (polycystic  ovarian syndrome)    Pre-diabetes     SURGICAL HISTORY: Past Surgical History:  Procedure Laterality Date   MASTECTOMY W/ SENTINEL NODE BIOPSY Right 01/31/2021   Procedure: RIGHT SIMPLE MASTECTOMY;  Surgeon: Vanderbilt Ned, MD;  Location: [facility] OR;  Service: General;  Laterality: Right;   MODIFIED MASTECTOMY Left 01/31/2021   Procedure: LEFT MODIFIED RADICAL MASTECTOMY;  Surgeon: Vanderbilt Ned, MD;  Location: [facility];  Service: General;  Laterality: Left;   NO PAST SURGERIES     PORTACATH PLACEMENT Right 10/18/2020   Procedure: INSERTION PORT-A-CATH;  Surgeon: Vanderbilt Ned, MD;  Location: [facility];  Service: General;  Laterality: Right;    SOCIAL HISTORY: Social History   Socioeconomic History   Marital status: Single    Spouse name: Not on file   Number of children: 0   Years of education: Not on file   Highest education level: Not on file  Occupational History   Occupation: Scientist, Forensic  Tobacco Use   Smoking status: Never   Smokeless tobacco: Never  Vaping Use   Vaping status: Never Used  Substance and Sexual Activity   Alcohol use: Never   Drug use: Never   Sexual activity: Not on file  Other Topics Concern   Not on file  Social History Narrative   Not on file   Social Drivers of Health   Tobacco Use: Low Risk (07/02/2024)   Patient History  Smoking Tobacco Use: Never    Smokeless Tobacco Use: Never    Passive Exposure: Not on file  Financial Resource Strain: Low Risk (07/02/2024)   Overall Financial Resource Strain (CARDIA)    Difficulty of Paying Living Expenses: Not hard at all  Food Insecurity: No Food Insecurity (07/02/2024)   Epic    Worried About Programme Researcher, Broadcasting/film/video in the Last Year: Never true    Ran Out of Food in the Last Year: Never true  Transportation Needs: No Transportation Needs (07/02/2024)   Epic    Lack of Transportation (Medical): No    Lack of Transportation (Non-Medical): No  Physical Activity: Insufficiently Active  (07/02/2024)   Exercise Vital Sign    Days of Exercise per Week: 7 days    Minutes of Exercise per Session: 20 min  Stress: No Stress Concern Present (07/02/2024)   [employer] of Occupational Health - Occupational Stress Questionnaire    Feeling of Stress: Not at all  Social Connections: Moderately Isolated (07/02/2024)   Social Connection and Isolation Panel    Frequency of Communication with Friends and Family: More than three times a week    Frequency of Social Gatherings with Friends and Family: More than three times a week    Attends Religious Services: 1 to 4 times per year    Active Member of [employer] or Organizations: No    Attends Banker Meetings: Never    Marital Status: Never married  Intimate Partner Violence: Not At Risk (07/02/2024)   Epic    Fear of Current or Ex-Partner: No    Emotionally Abused: No    Physically Abused: No    Sexually Abused: No  Depression (PHQ2-9): Low Risk (07/02/2024)   Depression (PHQ2-9)    PHQ-2 Score: 0  Alcohol Screen: Low Risk (07/02/2024)   Alcohol Screen    Last Alcohol Screening Score (AUDIT): 0  Housing: Unknown (07/02/2024)   Epic    Unable to Pay for Housing in the Last Year: No    Number of Times Moved in the Last Year: Not on file    Homeless in the Last Year: No  Utilities: Not At Risk (07/02/2024)   Epic    Threatened with loss of utilities: No  Health Literacy: Adequate Health Literacy (07/02/2024)   B1300 Health Literacy    Frequency of need for help with medical instructions: Never    FAMILY HISTORY: Family History  Problem Relation Age of Onset   Ovarian cancer Maternal Grandmother        unknown age of diagnosis   Prostate cancer Maternal Grandfather        metastatic   Breast cancer Cousin        unknown age of diagnosis, maternal first cousin   Stomach cancer Cousin        dx 20s/30s, maternal first cousin    Review of Systems - Oncology    PHYSICAL EXAMINATION   Onc Performance Status -  07/02/24 1413       ECOG Perf Status   ECOG Perf Status Restricted in physically strenuous activity but ambulatory and able to carry out work of a light or sedentary nature, e.g., light house work, office work      KPS SCALE   KPS % SCORE Able to carry on normal activity, minor Sullivan/Sullivan of disease          Vitals:   07/02/24 1358  BP: 115/73  Pulse: 74  Resp: 18  Temp: 98 F (  36.7 C)  SpO2: 100%    Physical Exam  LABORATORY DATA:  CBC    Component Value Date/Time   WBC 2.8 (L) 07/02/2024 1320   WBC 6.4 01/28/2023 1040   RBC 3.82 (L) 07/02/2024 1320   HGB 13.1 07/02/2024 1320   HCT 35.9 (L) 07/02/2024 1320   PLT 175 07/02/2024 1320   MCV 94.0 07/02/2024 1320   MCH 34.3 (H) 07/02/2024 1320   MCHC 36.5 (H) 07/02/2024 1320   RDW 12.1 07/02/2024 1320   LYMPHSABS 1.2 07/02/2024 1320   MONOABS 0.2 07/02/2024 1320   EOSABS 0.1 07/02/2024 1320   BASOSABS 0.0 07/02/2024 1320    CMP     Component Value Date/Time   NA 141 07/02/2024 1320   K 4.4 07/02/2024 1320   CL 106 07/02/2024 1320   CO2 23 07/02/2024 1320   GLUCOSE 82 07/02/2024 1320   BUN 18 07/02/2024 1320   CREATININE 0.67 07/02/2024 1320   CALCIUM  9.5 07/02/2024 1320   PROT 7.5 07/02/2024 1320   ALBUMIN 4.6 07/02/2024 1320   AST 30 07/02/2024 1320   ALT 21 07/02/2024 1320   ALKPHOS 51 07/02/2024 1320   BILITOT 0.8 07/02/2024 1320   GFRNONAA >60 07/02/2024 1320     ASSESSMENT and THERAPY PLAN:   No problem-specific Assessment & Plan notes found for this encounter.   Assessment and Plan Assessment & Plan       All questions were answered. The patient knows to call the clinic with any problems, questions or concerns. We can certainly see the patient much sooner if necessary.  Total encounter time:*** minutes*in face-to-face visit time, chart review, lab review, care coordination, order entry, and documentation of the encounter time.    Morna Kendall, NP 07/02/24 2:26 PM Medical Oncology and  Hematology [facility] [address] [place], [place] [address] Tel. [phone]    Fax. [phone]  *Total Encounter Time as defined by the Centers for Medicare and Medicaid Services includes, in addition to the face-to-face time of a patient visit (documented in the note above) non-face-to-face time: obtaining and reviewing outside history, ordering and reviewing medications, tests or procedures, care coordination (communications with other health care professionals or caregivers) and documentation in the medical record.

## 2024-07-03 LAB — CANCER ANTIGEN 27.29: CA 27.29: 20.1 U/mL (ref 0.0–38.6)

## 2024-07-06 ENCOUNTER — Encounter: Payer: Self-pay | Admitting: Hematology and Oncology

## 2024-07-08 ENCOUNTER — Other Ambulatory Visit (HOSPITAL_BASED_OUTPATIENT_CLINIC_OR_DEPARTMENT_OTHER): Payer: Self-pay

## 2024-07-08 ENCOUNTER — Other Ambulatory Visit: Payer: Self-pay

## 2024-07-12 ENCOUNTER — Ambulatory Visit

## 2024-07-19 ENCOUNTER — Ambulatory Visit (HOSPITAL_COMMUNITY)

## 2024-07-22 ENCOUNTER — Inpatient Hospital Stay: Attending: Hematology and Oncology | Admitting: Hematology and Oncology

## 2024-07-29 ENCOUNTER — Inpatient Hospital Stay

## 2024-08-26 ENCOUNTER — Inpatient Hospital Stay: Attending: Hematology and Oncology

## 2024-09-23 ENCOUNTER — Inpatient Hospital Stay: Admitting: Hematology and Oncology

## 2024-09-23 ENCOUNTER — Inpatient Hospital Stay

## 2024-09-23 ENCOUNTER — Inpatient Hospital Stay: Attending: Hematology and Oncology
